# Patient Record
Sex: Female | Born: 1965 | Hispanic: No | Smoking: Former smoker
Health system: Southern US, Community
[De-identification: ages and names within clinical notes are randomized; demographics above are authoritative.]

## PROBLEM LIST (undated history)

## (undated) DIAGNOSIS — E669 Obesity, unspecified: Secondary | ICD-10-CM

## (undated) DIAGNOSIS — I5030 Unspecified diastolic (congestive) heart failure: Secondary | ICD-10-CM

## (undated) DIAGNOSIS — E119 Type 2 diabetes mellitus without complications: Secondary | ICD-10-CM

## (undated) DIAGNOSIS — R569 Unspecified convulsions: Secondary | ICD-10-CM

## (undated) DIAGNOSIS — R011 Cardiac murmur, unspecified: Secondary | ICD-10-CM

## (undated) DIAGNOSIS — I509 Heart failure, unspecified: Secondary | ICD-10-CM

## (undated) DIAGNOSIS — I219 Acute myocardial infarction, unspecified: Secondary | ICD-10-CM

## (undated) DIAGNOSIS — I201 Angina pectoris with documented spasm: Secondary | ICD-10-CM

## (undated) DIAGNOSIS — I428 Other cardiomyopathies: Secondary | ICD-10-CM

## (undated) DIAGNOSIS — I10 Essential (primary) hypertension: Secondary | ICD-10-CM

## (undated) DIAGNOSIS — E785 Hyperlipidemia, unspecified: Secondary | ICD-10-CM

## (undated) DIAGNOSIS — I447 Left bundle-branch block, unspecified: Secondary | ICD-10-CM

## (undated) DIAGNOSIS — Z720 Tobacco use: Secondary | ICD-10-CM

## (undated) DIAGNOSIS — J45909 Unspecified asthma, uncomplicated: Secondary | ICD-10-CM

## (undated) DIAGNOSIS — G8929 Other chronic pain: Secondary | ICD-10-CM

## (undated) DIAGNOSIS — I2510 Atherosclerotic heart disease of native coronary artery without angina pectoris: Secondary | ICD-10-CM

## (undated) HISTORY — DX: Hyperlipidemia, unspecified: E78.5

## (undated) HISTORY — DX: Atherosclerotic heart disease of native coronary artery without angina pectoris: I25.10

## (undated) HISTORY — PX: ANTERIOR CRUCIATE LIGAMENT REPAIR: SHX115

## (undated) HISTORY — DX: Unspecified asthma, uncomplicated: J45.909

## (undated) HISTORY — DX: Essential (primary) hypertension: I10

## (undated) HISTORY — PX: EMPYEMA DRAINAGE: SHX5097

## (undated) HISTORY — DX: Type 2 diabetes mellitus without complications: E11.9

## (undated) HISTORY — DX: Obesity, unspecified: E66.9

## (undated) HISTORY — DX: Unspecified convulsions: R56.9

## (undated) HISTORY — PX: TUBAL LIGATION: SHX77

## (undated) HISTORY — DX: Heart failure, unspecified: I50.9

## (undated) HISTORY — DX: Cardiac murmur, unspecified: R01.1

## (undated) HISTORY — DX: Acute myocardial infarction, unspecified: I21.9

## (undated) HISTORY — PX: TONSILLECTOMY: SUR1361

## (undated) HISTORY — PX: COLONOSCOPY: SHX174

## (undated) HISTORY — PX: BACK SURGERY: SHX140

---

## 2004-07-05 ENCOUNTER — Emergency Department: Payer: Self-pay | Admitting: Emergency Medicine

## 2004-12-24 ENCOUNTER — Emergency Department: Payer: Self-pay | Admitting: Emergency Medicine

## 2005-04-07 ENCOUNTER — Ambulatory Visit: Payer: Self-pay | Admitting: Family Medicine

## 2005-04-17 ENCOUNTER — Ambulatory Visit: Payer: Self-pay | Admitting: Family Medicine

## 2005-06-26 ENCOUNTER — Ambulatory Visit: Payer: Self-pay | Admitting: Unknown Physician Specialty

## 2005-06-29 ENCOUNTER — Ambulatory Visit: Payer: Self-pay | Admitting: Unknown Physician Specialty

## 2005-07-13 ENCOUNTER — Emergency Department: Payer: Self-pay | Admitting: Emergency Medicine

## 2005-07-13 ENCOUNTER — Other Ambulatory Visit: Payer: Self-pay

## 2005-07-14 ENCOUNTER — Emergency Department: Payer: Self-pay | Admitting: Unknown Physician Specialty

## 2005-07-17 ENCOUNTER — Ambulatory Visit: Payer: Self-pay | Admitting: Unknown Physician Specialty

## 2005-11-17 ENCOUNTER — Other Ambulatory Visit: Payer: Self-pay

## 2005-11-17 ENCOUNTER — Inpatient Hospital Stay: Payer: Self-pay | Admitting: Cardiovascular Disease

## 2005-11-18 ENCOUNTER — Other Ambulatory Visit: Payer: Self-pay

## 2005-11-24 ENCOUNTER — Emergency Department: Payer: Self-pay | Admitting: Emergency Medicine

## 2005-11-24 ENCOUNTER — Other Ambulatory Visit: Payer: Self-pay

## 2006-01-18 ENCOUNTER — Ambulatory Visit: Payer: Self-pay | Admitting: Family Medicine

## 2006-04-10 HISTORY — PX: TONSILLECTOMY AND ADENOIDECTOMY: SHX28

## 2006-05-14 ENCOUNTER — Other Ambulatory Visit: Payer: Self-pay

## 2006-05-14 ENCOUNTER — Emergency Department: Payer: Self-pay | Admitting: Unknown Physician Specialty

## 2006-05-28 ENCOUNTER — Ambulatory Visit: Payer: Self-pay | Admitting: Unknown Physician Specialty

## 2006-06-02 ENCOUNTER — Emergency Department: Payer: Self-pay | Admitting: Unknown Physician Specialty

## 2006-06-02 ENCOUNTER — Other Ambulatory Visit: Payer: Self-pay

## 2007-01-28 ENCOUNTER — Emergency Department: Payer: Self-pay | Admitting: Emergency Medicine

## 2007-08-06 IMAGING — CR PARANASAL SINUSES - COMPLETE 3 + VIEW
1 series · 5 of 5 positions shown · non-contrast
Comparison: none

REASON FOR EXAM: facial swelling  rm 2
COMMENTS:

[Series 1: view not recorded · 0.17mm/px · 5 of 5 slices shown]
[im 1/5]
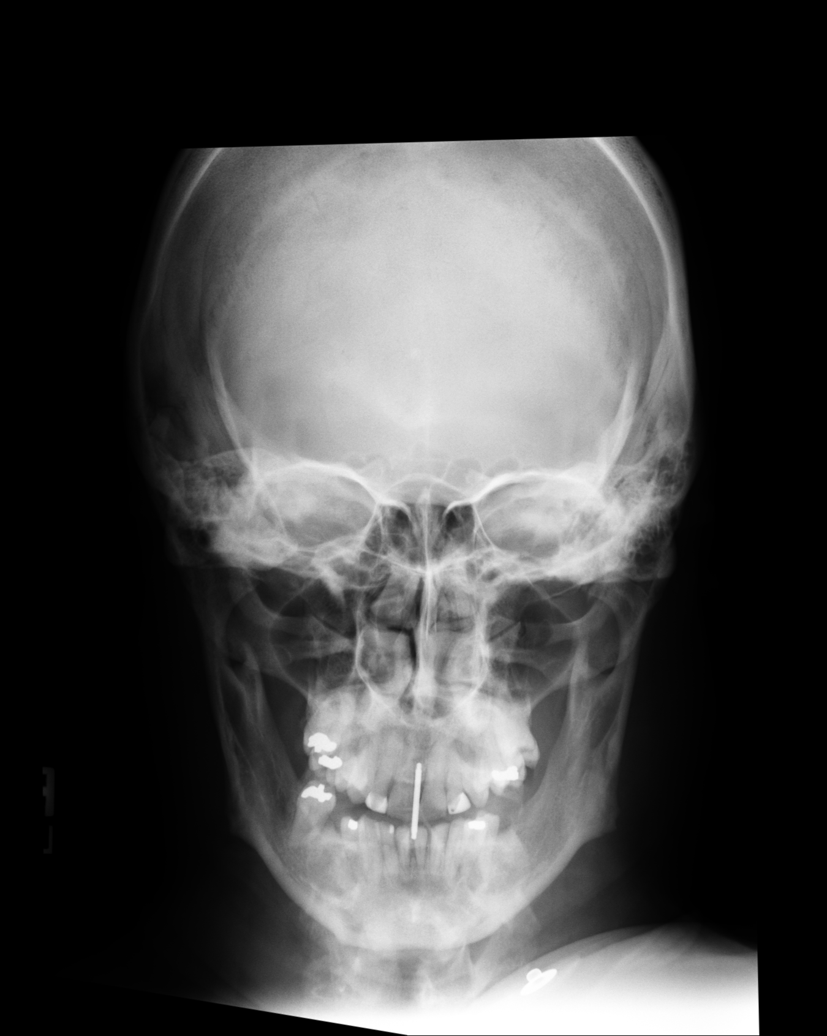
[im 2/5]
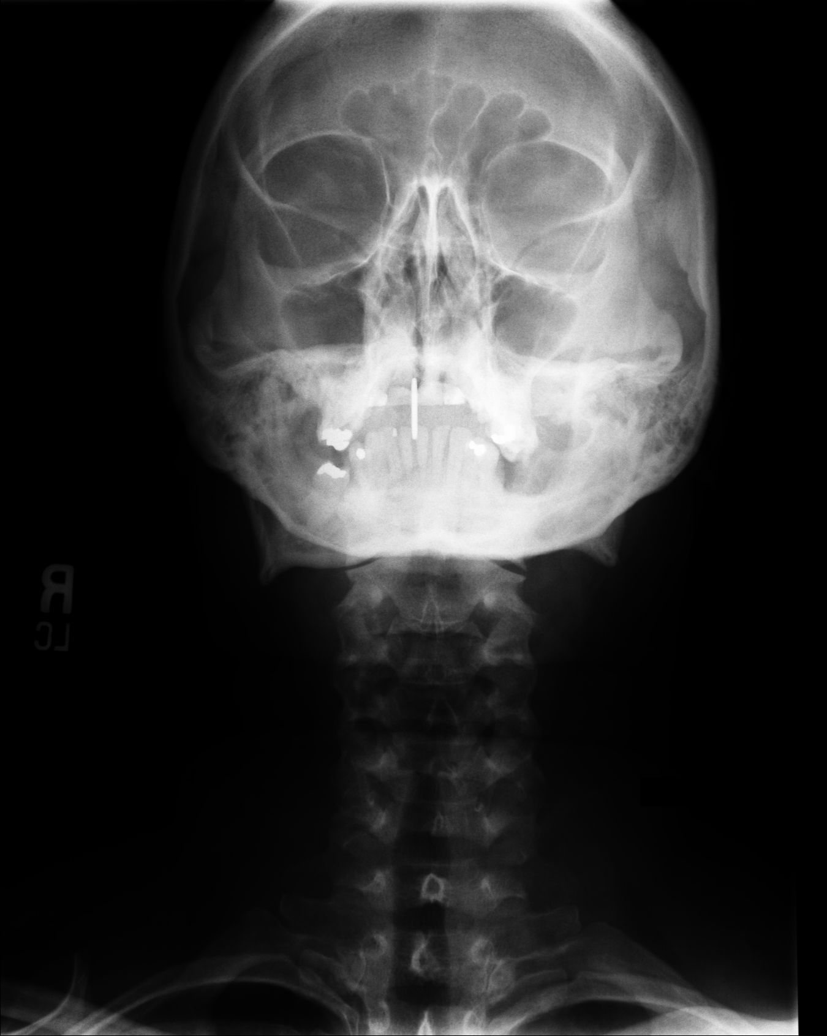
[im 3/5]
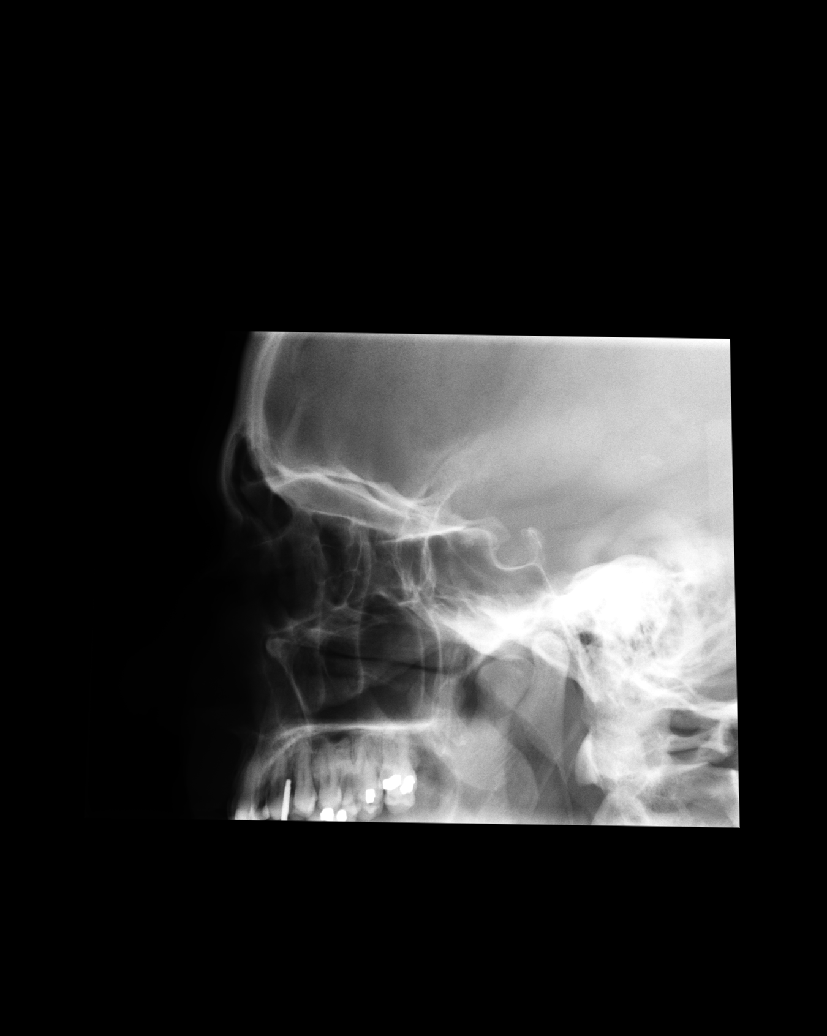
[im 4/5]
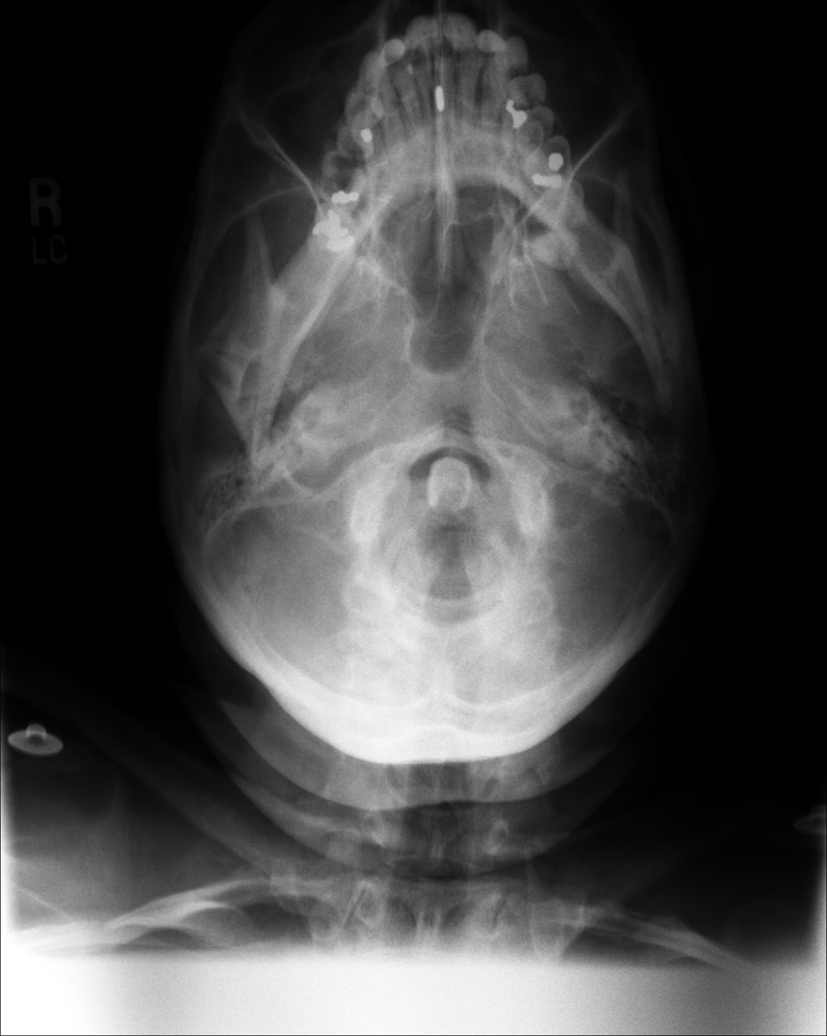
[im 5/5]
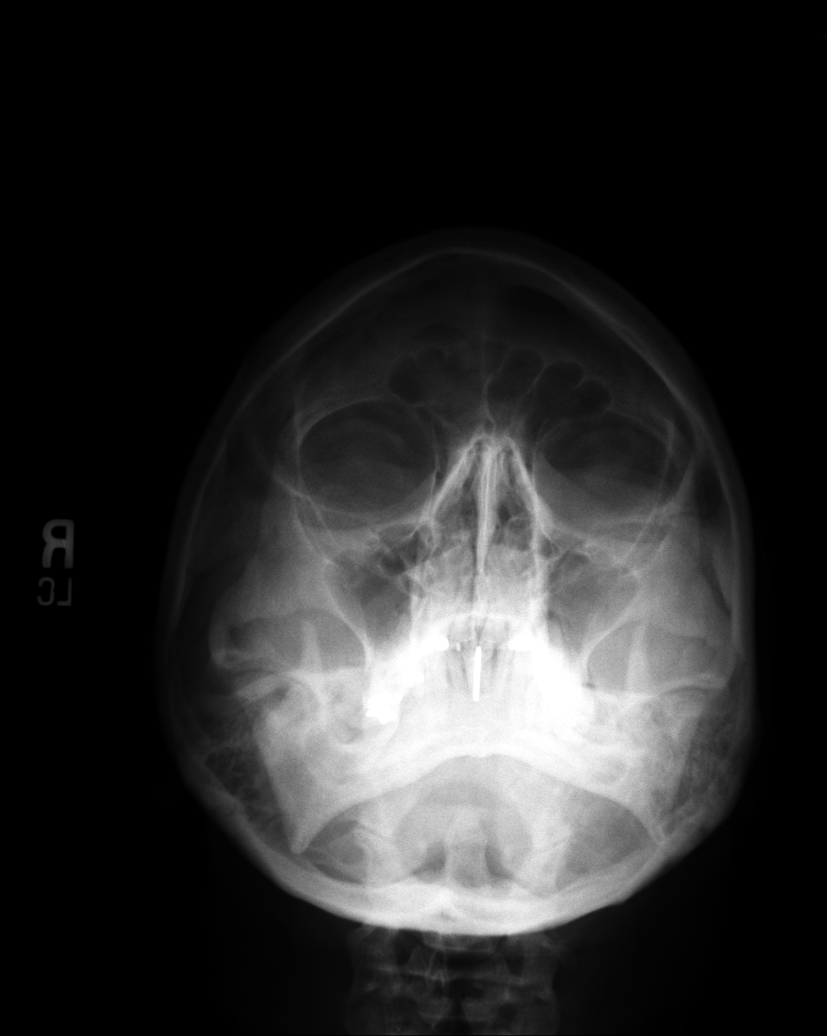

[5 of 5 positions shown; findings below may reference images not displayed]

PROCEDURE:     DXR - DXR SINUSES PARANASAL COMPLETE  - July 13, 2005  [DATE]

RESULT:     Evaluation of the paranasal sinuses demonstrates no evidence of
air fluid levels nor mucoperiosteal thickening.  The sinuses appear patent
without evidence of opacification. The visualized bony skeleton demonstrates
no evidence of fracture nor dislocation.
IMPRESSION: 1)Unremarkable paranasal sinus evaluation as described above.

## 2007-08-07 IMAGING — CT CT HEAD WITHOUT CONTRAST
2 series · 16 of 30 positions shown, 20 images · non-contrast
Comparison: none

REASON FOR EXAM: headache  RM 10
COMMENTS:

[Series 2: without · axial · non-contrast · 0.41mm/px · z∈[-149,-34]mm · 13 of 27 slices shown, 17 images]
[im 2/27  brain]
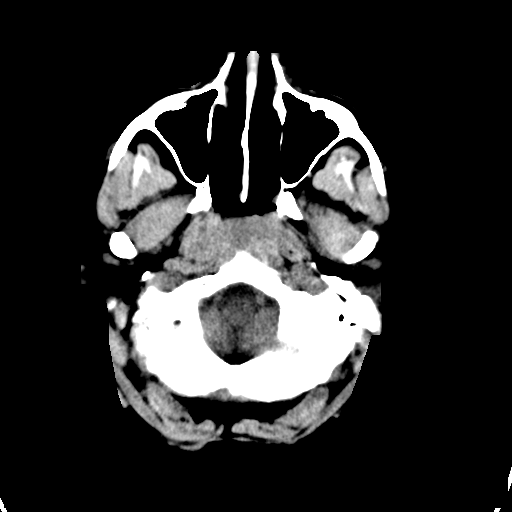
[im 2/27  bone]
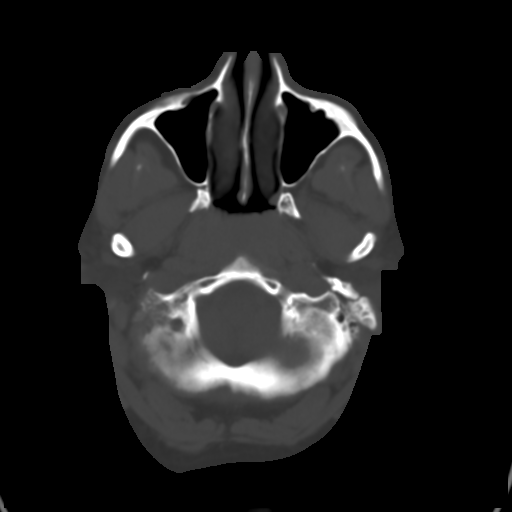
[im 4/27  brain]
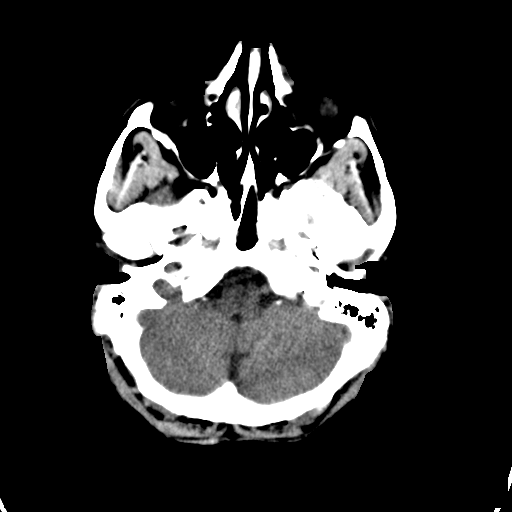
[im 6/27  brain]
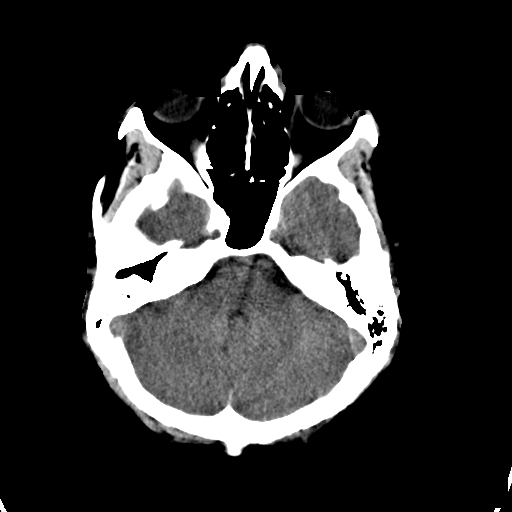
[im 8/27  brain]
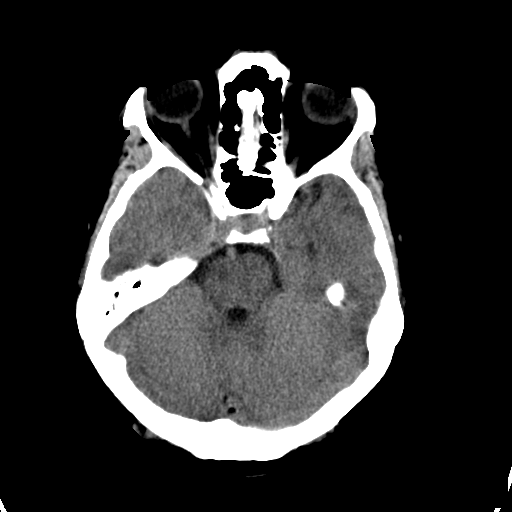
[im 10/27  brain]
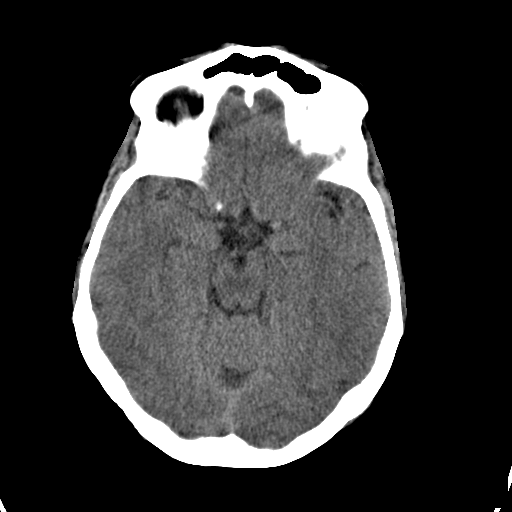
[im 10/27  bone]
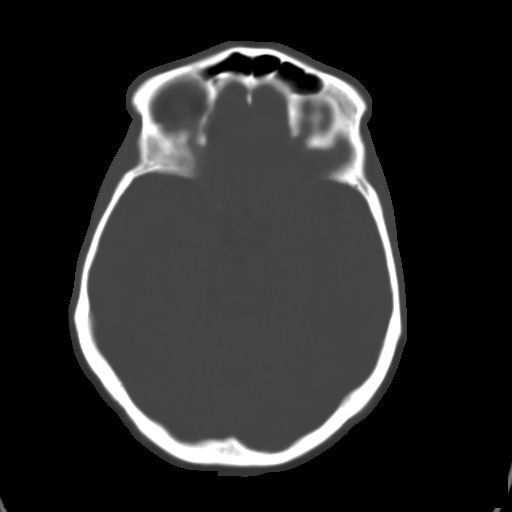
[im 12/27  brain]
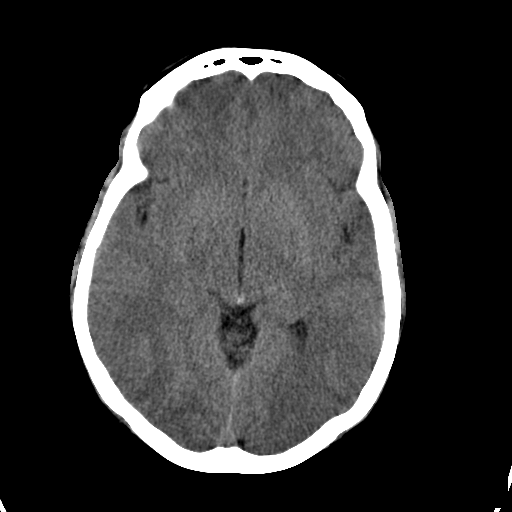
[im 14/27  brain]
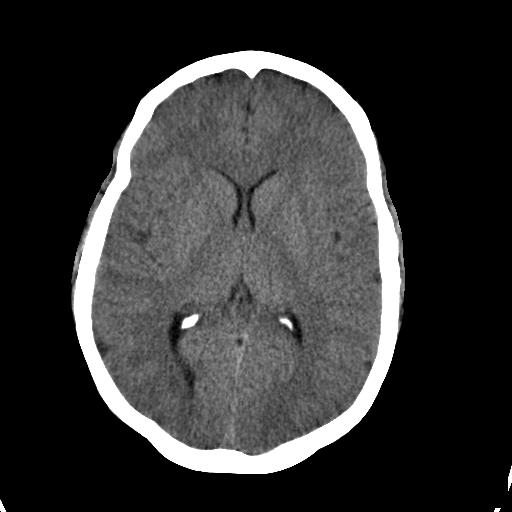
[im 15/27  brain]
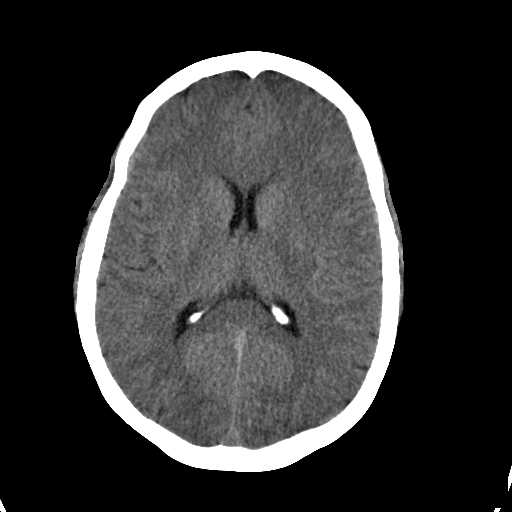
[im 17/27  brain]
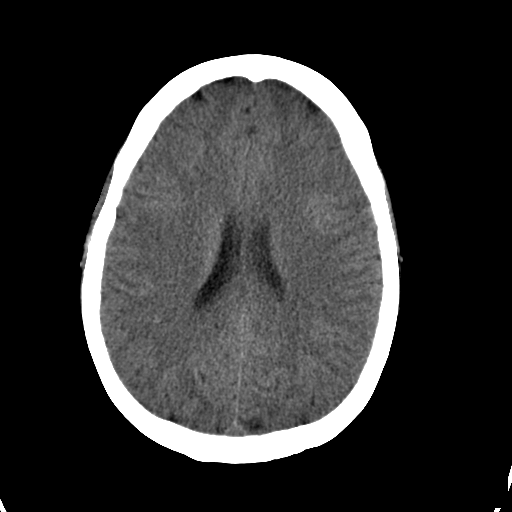
[im 17/27  bone]
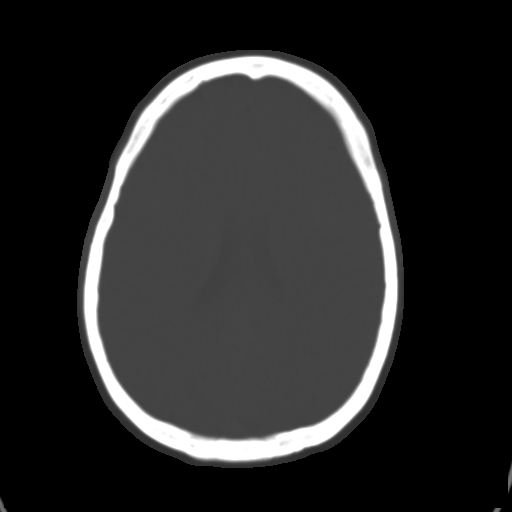
[im 19/27  brain]
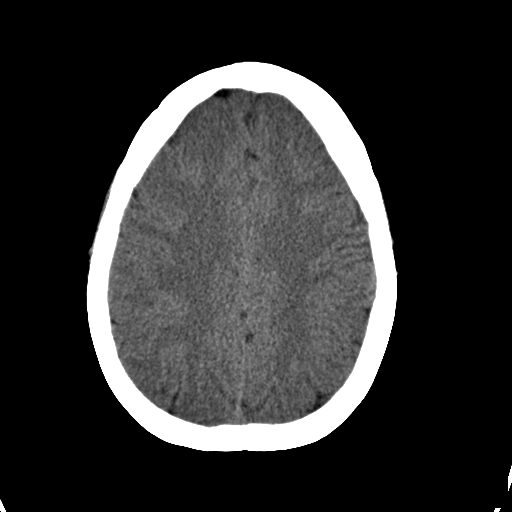
[im 21/27  brain]
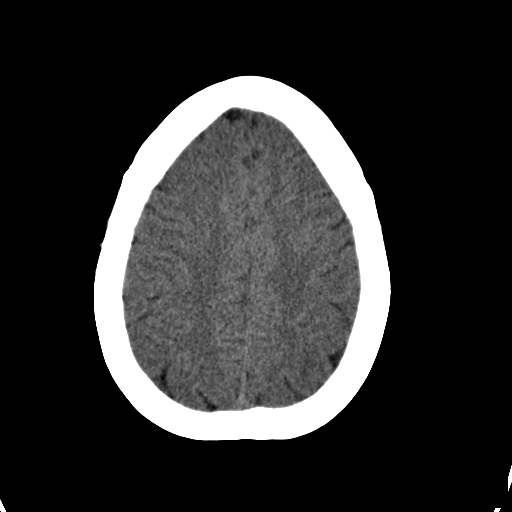
[im 23/27  brain]
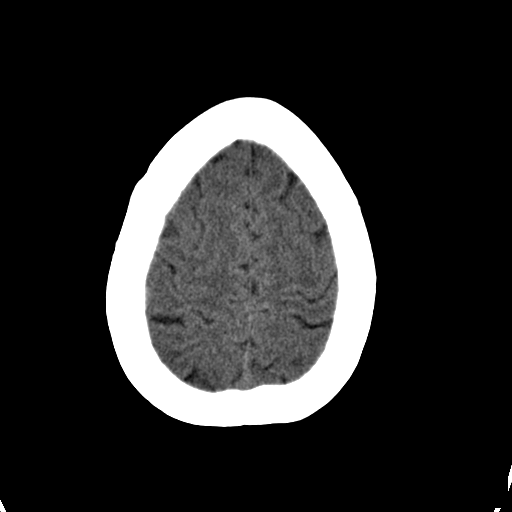
[im 25/27  brain]
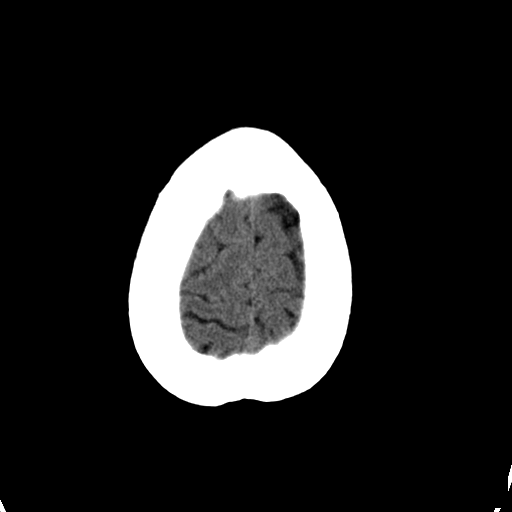
[im 25/27  bone]
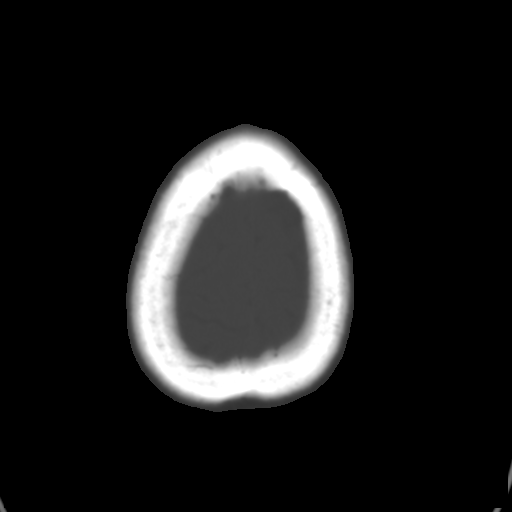

[Series 3: bone · axial · 0.41mm/px · z∈[-149,-109]mm · 3 of 27 slices shown]
[im 2/27  bone]
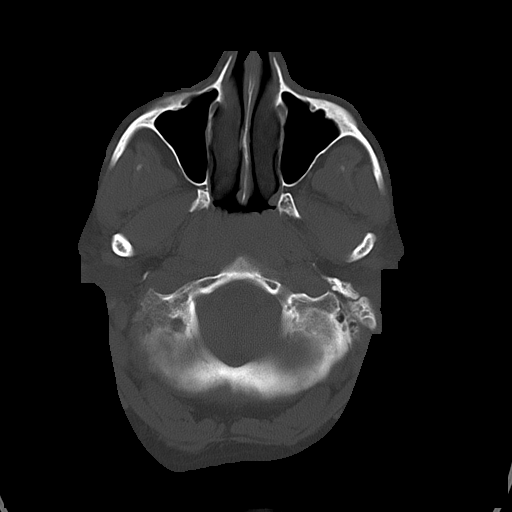
[im 6/27  bone]
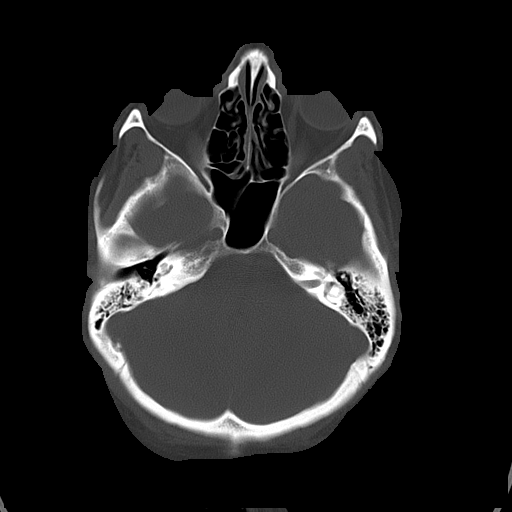
[im 10/27  bone]
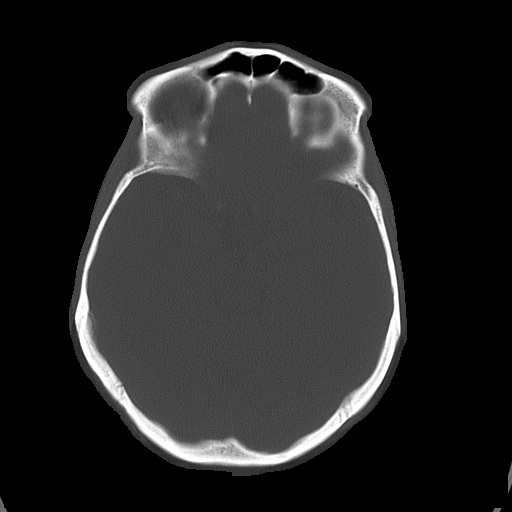

[16 of 30 positions shown; findings below may reference images not displayed]

PROCEDURE:     CT  - CT HEAD WITHOUT CONTRAST  - July 14, 2005  [DATE]

RESULT:     There is no evidence of intra-axial nor extra-axial fluid
collections nor evidence of acute hemorrhage. No secondary signs are
appreciated to suggest mass effect, subacute or chronic infarction. The
visualized bony skeleton evaluated with bone windowing demonstrates no
evidence of fracture or dislocation.
IMPRESSION: 1)Unremarkable Head CT as described above.

2)Dr. Hube of the Emergency Department was informed of these findings at the
time of the initial interpretation.

## 2009-05-28 ENCOUNTER — Inpatient Hospital Stay: Payer: Self-pay | Admitting: Internal Medicine

## 2010-02-13 ENCOUNTER — Emergency Department: Payer: Self-pay | Admitting: Emergency Medicine

## 2010-04-20 ENCOUNTER — Ambulatory Visit: Payer: Self-pay | Admitting: Internal Medicine

## 2010-04-29 ENCOUNTER — Inpatient Hospital Stay: Payer: Self-pay | Admitting: Internal Medicine

## 2013-02-03 ENCOUNTER — Emergency Department: Payer: Self-pay | Admitting: Emergency Medicine

## 2013-02-28 ENCOUNTER — Emergency Department: Payer: Self-pay | Admitting: Emergency Medicine

## 2013-02-28 LAB — COMPREHENSIVE METABOLIC PANEL
Albumin: 3.6 g/dL (ref 3.4–5.0)
Bilirubin,Total: 0.2 mg/dL (ref 0.2–1.0)
Chloride: 108 mmol/L — ABNORMAL HIGH (ref 98–107)
Co2: 25 mmol/L (ref 21–32)
Creatinine: 0.83 mg/dL (ref 0.60–1.30)
EGFR (African American): >60
EGFR (Non-African Amer.): >60
Glucose: 109 mg/dL — ABNORMAL HIGH (ref 65–99)
Osmolality: 278 (ref 275–301)
Potassium: 3.7 mmol/L (ref 3.5–5.1)
SGOT(AST): 22 U/L (ref 15–37)
SGPT (ALT): 24 U/L (ref 12–78)
Sodium: 139 mmol/L (ref 136–145)

## 2013-02-28 LAB — CBC
HGB: 14.4 g/dL (ref 12.0–16.0)
MCH: 29.1 pg (ref 26.0–34.0)
MCHC: 33.5 g/dL (ref 32.0–36.0)
MCV: 87 fL (ref 80–100)
Platelet: 205 10*3/uL (ref 150–440)
RDW: 14.1 % (ref 11.5–14.5)
WBC: 9.9 10*3/uL (ref 3.6–11.0)

## 2013-02-28 LAB — TROPONIN I
Troponin-I: <0.02 ng/mL
Troponin-I: <0.02 ng/mL

## 2013-02-28 LAB — URINALYSIS, COMPLETE
Bilirubin,UR: NEGATIVE
Ketone: NEGATIVE
Leukocyte Esterase: NEGATIVE
Nitrite: NEGATIVE
Ph: 6 (ref 4.5–8.0)
RBC,UR: 9 /HPF (ref 0–5)
Specific Gravity: 1.016 (ref 1.003–1.030)
Squamous Epithelial: 1
WBC UR: 2 /HPF (ref 0–5)

## 2013-02-28 LAB — CK TOTAL AND CKMB (NOT AT ARMC): CK, Total: 55 U/L (ref 21–215)

## 2014-05-21 DIAGNOSIS — E785 Hyperlipidemia, unspecified: Secondary | ICD-10-CM | POA: Insufficient documentation

## 2014-05-21 DIAGNOSIS — I2510 Atherosclerotic heart disease of native coronary artery without angina pectoris: Secondary | ICD-10-CM | POA: Insufficient documentation

## 2014-05-21 DIAGNOSIS — I10 Essential (primary) hypertension: Secondary | ICD-10-CM | POA: Insufficient documentation

## 2014-11-19 ENCOUNTER — Other Ambulatory Visit: Payer: Self-pay

## 2014-11-26 ENCOUNTER — Ambulatory Visit: Payer: Self-pay

## 2014-11-26 DIAGNOSIS — R569 Unspecified convulsions: Secondary | ICD-10-CM | POA: Insufficient documentation

## 2014-12-09 ENCOUNTER — Other Ambulatory Visit: Payer: Self-pay | Admitting: Urology

## 2014-12-09 DIAGNOSIS — R569 Unspecified convulsions: Secondary | ICD-10-CM

## 2015-01-01 ENCOUNTER — Telehealth: Payer: Self-pay | Admitting: Urology

## 2015-01-01 NOTE — Telephone Encounter (Signed)
You ordered a scan on this patient and she does not have any insurance. I call her and she does not want to have it done due to that. She said she was a [facility] and may try to get it done thru them.  Thanks, Sharyn Lull

## 2015-05-27 ENCOUNTER — Ambulatory Visit: Payer: Self-pay

## 2015-05-27 LAB — LIPID PANEL
CHOLESTEROL: 154 mg/dL (ref 0–200)
HDL: 23 mg/dL — AB (ref 35–70)
LDL Cholesterol: 85 mg/dL
TRIGLYCERIDES: 231 mg/dL — AB (ref 40–160)

## 2015-05-27 LAB — HEPATIC FUNCTION PANEL
ALK PHOS: 84 U/L (ref 25–125)
ALT: 21 U/L (ref 7–35)
AST: 18 U/L (ref 13–35)
BILIRUBIN, TOTAL: 0.2 mg/dL

## 2015-05-27 LAB — CBC AND DIFFERENTIAL
HEMATOCRIT: 41 % (ref 36–46)
HEMOGLOBIN: 13.8 g/dL (ref 12.0–16.0)
Neutrophils Absolute: 6 /uL
Platelets: 281 10*3/uL (ref 150–399)
WBC: 10.5 10^3/mL

## 2015-05-27 LAB — BASIC METABOLIC PANEL
BUN: 8 mg/dL (ref 4–21)
CREATININE: 0.7 mg/dL (ref 0.5–1.1)
Glucose: 99 mg/dL
POTASSIUM: 4.6 mmol/L (ref 3.4–5.3)
SODIUM: 140 mmol/L (ref 137–147)

## 2015-05-27 LAB — TSH: TSH: 1.51 u[IU]/mL (ref 0.41–5.90)

## 2015-05-27 LAB — HEMOGLOBIN A1C: Hemoglobin A1C: 5.9

## 2015-06-03 ENCOUNTER — Ambulatory Visit: Payer: Self-pay

## 2015-06-03 DIAGNOSIS — Z720 Tobacco use: Secondary | ICD-10-CM | POA: Insufficient documentation

## 2015-06-03 DIAGNOSIS — E669 Obesity, unspecified: Secondary | ICD-10-CM | POA: Insufficient documentation

## 2015-06-03 DIAGNOSIS — I252 Old myocardial infarction: Secondary | ICD-10-CM | POA: Insufficient documentation

## 2015-06-03 DIAGNOSIS — E785 Hyperlipidemia, unspecified: Secondary | ICD-10-CM | POA: Insufficient documentation

## 2015-06-03 DIAGNOSIS — R7303 Prediabetes: Secondary | ICD-10-CM | POA: Insufficient documentation

## 2015-06-03 DIAGNOSIS — D72820 Lymphocytosis (symptomatic): Secondary | ICD-10-CM | POA: Insufficient documentation

## 2015-07-20 DIAGNOSIS — I10 Essential (primary) hypertension: Secondary | ICD-10-CM

## 2015-07-20 DIAGNOSIS — R569 Unspecified convulsions: Secondary | ICD-10-CM

## 2015-07-20 DIAGNOSIS — R7303 Prediabetes: Secondary | ICD-10-CM

## 2015-07-20 DIAGNOSIS — E785 Hyperlipidemia, unspecified: Secondary | ICD-10-CM

## 2015-07-20 DIAGNOSIS — Z720 Tobacco use: Secondary | ICD-10-CM

## 2015-07-20 DIAGNOSIS — I252 Old myocardial infarction: Secondary | ICD-10-CM

## 2015-07-20 DIAGNOSIS — E669 Obesity, unspecified: Secondary | ICD-10-CM

## 2015-07-20 DIAGNOSIS — I25119 Atherosclerotic heart disease of native coronary artery with unspecified angina pectoris: Secondary | ICD-10-CM

## 2015-07-20 DIAGNOSIS — D72820 Lymphocytosis (symptomatic): Secondary | ICD-10-CM

## 2015-11-05 ENCOUNTER — Observation Stay
Admission: EM | Admit: 2015-11-05 | Discharge: 2015-11-06 | Disposition: A | Discharge status: Home / Self Care | Payer: Self-pay | Attending: Internal Medicine | Admitting: Internal Medicine

## 2015-11-05 ENCOUNTER — Encounter: Payer: Self-pay | Admitting: Emergency Medicine

## 2015-11-05 ENCOUNTER — Emergency Department: Payer: Self-pay

## 2015-11-05 ENCOUNTER — Emergency Department
Admission: EM | Admit: 2015-11-05 | Discharge: 2015-11-05 | Discharge status: Against Medical Advice | Payer: Self-pay | Attending: Emergency Medicine | Admitting: Emergency Medicine

## 2015-11-05 DIAGNOSIS — R0789 Other chest pain: Principal | ICD-10-CM | POA: Insufficient documentation

## 2015-11-05 DIAGNOSIS — E785 Hyperlipidemia, unspecified: Secondary | ICD-10-CM | POA: Insufficient documentation

## 2015-11-05 DIAGNOSIS — Z8249 Family history of ischemic heart disease and other diseases of the circulatory system: Secondary | ICD-10-CM | POA: Insufficient documentation

## 2015-11-05 DIAGNOSIS — Z888 Allergy status to other drugs, medicaments and biological substances status: Secondary | ICD-10-CM | POA: Insufficient documentation

## 2015-11-05 DIAGNOSIS — I252 Old myocardial infarction: Secondary | ICD-10-CM | POA: Insufficient documentation

## 2015-11-05 DIAGNOSIS — Z955 Presence of coronary angioplasty implant and graft: Secondary | ICD-10-CM | POA: Insufficient documentation

## 2015-11-05 DIAGNOSIS — Z886 Allergy status to analgesic agent status: Secondary | ICD-10-CM | POA: Insufficient documentation

## 2015-11-05 DIAGNOSIS — F17210 Nicotine dependence, cigarettes, uncomplicated: Secondary | ICD-10-CM | POA: Insufficient documentation

## 2015-11-05 DIAGNOSIS — J45909 Unspecified asthma, uncomplicated: Secondary | ICD-10-CM | POA: Insufficient documentation

## 2015-11-05 DIAGNOSIS — Z823 Family history of stroke: Secondary | ICD-10-CM | POA: Insufficient documentation

## 2015-11-05 DIAGNOSIS — I509 Heart failure, unspecified: Secondary | ICD-10-CM | POA: Insufficient documentation

## 2015-11-05 DIAGNOSIS — Z825 Family history of asthma and other chronic lower respiratory diseases: Secondary | ICD-10-CM | POA: Insufficient documentation

## 2015-11-05 DIAGNOSIS — Z6839 Body mass index (BMI) 39.0-39.9, adult: Secondary | ICD-10-CM | POA: Insufficient documentation

## 2015-11-05 DIAGNOSIS — I110 Hypertensive heart disease with heart failure: Secondary | ICD-10-CM | POA: Insufficient documentation

## 2015-11-05 DIAGNOSIS — R7303 Prediabetes: Secondary | ICD-10-CM | POA: Insufficient documentation

## 2015-11-05 DIAGNOSIS — I447 Left bundle-branch block, unspecified: Secondary | ICD-10-CM | POA: Insufficient documentation

## 2015-11-05 DIAGNOSIS — R079 Chest pain, unspecified: Secondary | ICD-10-CM

## 2015-11-05 DIAGNOSIS — I493 Ventricular premature depolarization: Secondary | ICD-10-CM | POA: Insufficient documentation

## 2015-11-05 DIAGNOSIS — Z885 Allergy status to narcotic agent status: Secondary | ICD-10-CM | POA: Insufficient documentation

## 2015-11-05 DIAGNOSIS — Z7902 Long term (current) use of antithrombotics/antiplatelets: Secondary | ICD-10-CM | POA: Insufficient documentation

## 2015-11-05 DIAGNOSIS — I2510 Atherosclerotic heart disease of native coronary artery without angina pectoris: Secondary | ICD-10-CM | POA: Insufficient documentation

## 2015-11-05 DIAGNOSIS — E669 Obesity, unspecified: Secondary | ICD-10-CM | POA: Insufficient documentation

## 2015-11-05 DIAGNOSIS — Z841 Family history of disorders of kidney and ureter: Secondary | ICD-10-CM | POA: Insufficient documentation

## 2015-11-05 DIAGNOSIS — Z833 Family history of diabetes mellitus: Secondary | ICD-10-CM | POA: Insufficient documentation

## 2015-11-05 DIAGNOSIS — R569 Unspecified convulsions: Secondary | ICD-10-CM | POA: Insufficient documentation

## 2015-11-05 DIAGNOSIS — Z7982 Long term (current) use of aspirin: Secondary | ICD-10-CM | POA: Insufficient documentation

## 2015-11-05 LAB — CBC WITH DIFFERENTIAL/PLATELET
BASOS PCT: 1 %
Basophils Absolute: 0.1 10*3/uL (ref 0–0.1)
EOS ABS: 0.1 10*3/uL (ref 0–0.7)
EOS PCT: 2 %
HCT: 43.4 % (ref 35.0–47.0)
HEMOGLOBIN: 14.9 g/dL (ref 12.0–16.0)
LYMPHS ABS: 1.9 10*3/uL (ref 1.0–3.6)
Lymphocytes Relative: 28 %
MCH: 29.6 pg (ref 26.0–34.0)
MCHC: 34.4 g/dL (ref 32.0–36.0)
MCV: 85.9 fL (ref 80.0–100.0)
Monocytes Absolute: 0.4 10*3/uL (ref 0.2–0.9)
Monocytes Relative: 6 %
NEUTROS PCT: 63 %
Neutro Abs: 4.2 10*3/uL (ref 1.4–6.5)
PLATELETS: 216 10*3/uL (ref 150–440)
RBC: 5.05 MIL/uL (ref 3.80–5.20)
RDW: 14.6 % — ABNORMAL HIGH (ref 11.5–14.5)
WBC: 6.6 10*3/uL (ref 3.6–11.0)

## 2015-11-05 LAB — PROTIME-INR
INR: 0.99
PROTHROMBIN TIME: 13.1 s (ref 11.4–15.2)

## 2015-11-05 LAB — CBC
HCT: 45.4 % (ref 35.0–47.0)
Hemoglobin: 14.8 g/dL (ref 12.0–16.0)
MCH: 28.3 pg (ref 26.0–34.0)
MCHC: 32.7 g/dL (ref 32.0–36.0)
MCV: 86.7 fL (ref 80.0–100.0)
PLATELETS: 210 10*3/uL (ref 150–440)
RBC: 5.23 MIL/uL — ABNORMAL HIGH (ref 3.80–5.20)
RDW: 14.8 % — AB (ref 11.5–14.5)
WBC: 8.2 10*3/uL (ref 3.6–11.0)

## 2015-11-05 LAB — COMPREHENSIVE METABOLIC PANEL
ALBUMIN: 4.3 g/dL (ref 3.5–5.0)
ALK PHOS: 84 U/L (ref 38–126)
ALT: 23 U/L (ref 14–54)
ANION GAP: 8 (ref 5–15)
AST: 23 U/L (ref 15–41)
BUN: 11 mg/dL (ref 6–20)
CHLORIDE: 107 mmol/L (ref 101–111)
CO2: 23 mmol/L (ref 22–32)
Calcium: 9.1 mg/dL (ref 8.9–10.3)
Creatinine, Ser: 0.69 mg/dL (ref 0.44–1.00)
GFR calc non Af Amer: >60 mL/min (ref >60)
GLUCOSE: 104 mg/dL — AB (ref 65–99)
Potassium: 3.7 mmol/L (ref 3.5–5.1)
SODIUM: 138 mmol/L (ref 135–145)
Total Bilirubin: 0.7 mg/dL (ref 0.3–1.2)
Total Protein: 7.6 g/dL (ref 6.5–8.1)

## 2015-11-05 LAB — BASIC METABOLIC PANEL
Anion gap: 10 (ref 5–15)
BUN: 11 mg/dL (ref 6–20)
CALCIUM: 9 mg/dL (ref 8.9–10.3)
CO2: 22 mmol/L (ref 22–32)
CREATININE: 0.81 mg/dL (ref 0.44–1.00)
Chloride: 105 mmol/L (ref 101–111)
GFR calc Af Amer: >60 mL/min (ref >60)
GLUCOSE: 116 mg/dL — AB (ref 65–99)
POTASSIUM: 3.6 mmol/L (ref 3.5–5.1)
SODIUM: 137 mmol/L (ref 135–145)

## 2015-11-05 LAB — TROPONIN I
Troponin I: <0.03 ng/mL (ref <0.03)
Troponin I: <0.03 ng/mL (ref <0.03)

## 2015-11-05 LAB — APTT: APTT: 30 s (ref 24–36)

## 2015-11-05 LAB — LIPASE, BLOOD: LIPASE: 16 U/L (ref 11–51)

## 2015-11-05 LAB — HEPARIN LEVEL (UNFRACTIONATED)

## 2015-11-05 MED ORDER — SODIUM CHLORIDE 0.9% FLUSH
3.0000 mL | Freq: Two times a day (BID) | INTRAVENOUS | Status: DC
  Administered 2015-11-05: 3 mL via INTRAVENOUS

## 2015-11-05 MED ORDER — SIMVASTATIN 20 MG PO TABS
20.0000 mg | ORAL_TABLET | Freq: Every day | ORAL | Status: DC
  Administered 2015-11-05: 20 mg via ORAL
  Filled 2015-11-05: qty 1

## 2015-11-05 MED ORDER — HEPARIN (PORCINE) IN NACL 100-0.45 UNIT/ML-% IJ SOLN
1400.0000 [IU]/h | INTRAMUSCULAR | Status: DC
  Administered 2015-11-05: 900 [IU]/h via INTRAVENOUS
  Filled 2015-11-05 (×3): qty 250

## 2015-11-05 MED ORDER — DOCUSATE SODIUM 100 MG PO CAPS
100.0000 mg | ORAL_CAPSULE | Freq: Two times a day (BID) | ORAL | Status: DC
  Filled 2015-11-05 (×3): qty 1

## 2015-11-05 MED ORDER — ISOSORBIDE DINITRATE 20 MG PO TABS: 30.0000 mg | ORAL_TABLET | Freq: Every day | ORAL | Status: DC

## 2015-11-05 MED ORDER — ASPIRIN 81 MG PO CHEW
162.0000 mg | CHEWABLE_TABLET | Freq: Once | ORAL | Status: AC
  Administered 2015-11-05: 162 mg via ORAL
  Filled 2015-11-05: qty 2

## 2015-11-05 MED ORDER — HEPARIN BOLUS VIA INFUSION
4000.0000 [IU] | Freq: Once | INTRAVENOUS | Status: AC
  Administered 2015-11-05: 4000 [IU] via INTRAVENOUS
  Filled 2015-11-05: qty 4000

## 2015-11-05 MED ORDER — MECLIZINE HCL 25 MG PO TABS: 25.0000 mg | ORAL_TABLET | Freq: Three times a day (TID) | ORAL | Status: DC | PRN

## 2015-11-05 MED ORDER — NITROGLYCERIN 0.4 MG SL SUBL: 0.4000 mg | SUBLINGUAL_TABLET | SUBLINGUAL | Status: DC | PRN

## 2015-11-05 MED ORDER — ONDANSETRON HCL 4 MG PO TABS: 4.0000 mg | ORAL_TABLET | Freq: Four times a day (QID) | ORAL | Status: DC | PRN

## 2015-11-05 MED ORDER — METOPROLOL TARTRATE 25 MG PO TABS
25.0000 mg | ORAL_TABLET | Freq: Two times a day (BID) | ORAL | Status: DC
  Administered 2015-11-05 – 2015-11-06 (×2): 25 mg via ORAL
  Filled 2015-11-05 (×3): qty 1

## 2015-11-05 MED ORDER — AMLODIPINE BESYLATE 5 MG PO TABS
5.0000 mg | ORAL_TABLET | Freq: Every day | ORAL | Status: DC
  Administered 2015-11-05 – 2015-11-06 (×2): 5 mg via ORAL
  Filled 2015-11-05 (×2): qty 1

## 2015-11-05 MED ORDER — NITROGLYCERIN 0.4 MG SL SUBL
0.4000 mg | SUBLINGUAL_TABLET | SUBLINGUAL | Status: DC | PRN
  Administered 2015-11-05: 0.4 mg via SUBLINGUAL
  Filled 2015-11-05: qty 1

## 2015-11-05 MED ORDER — CLOPIDOGREL BISULFATE 75 MG PO TABS
75.0000 mg | ORAL_TABLET | Freq: Every day | ORAL | Status: DC
  Administered 2015-11-05 – 2015-11-06 (×2): 75 mg via ORAL
  Filled 2015-11-05 (×2): qty 1

## 2015-11-05 MED ORDER — ONDANSETRON HCL 4 MG/2ML IJ SOLN: 4.0000 mg | Freq: Four times a day (QID) | INTRAMUSCULAR | Status: DC | PRN

## 2015-11-05 MED ORDER — HEPARIN BOLUS VIA INFUSION
2600.0000 [IU] | Freq: Once | INTRAVENOUS | Status: AC
  Administered 2015-11-05: 2600 [IU] via INTRAVENOUS
  Filled 2015-11-05: qty 2600

## 2015-11-05 MED ORDER — ISOSORBIDE MONONITRATE ER 30 MG PO TB24
30.0000 mg | ORAL_TABLET | Freq: Every day | ORAL | Status: DC
  Administered 2015-11-05 – 2015-11-06 (×2): 30 mg via ORAL
  Filled 2015-11-05 (×2): qty 1

## 2015-11-05 NOTE — ED Notes (Signed)
Resumed care from [place]; pt resting comfortably with SO at bedside. Pt reports pain decreased to 4/10 after SL nitro. NAD noted.

## 2015-11-05 NOTE — ED Notes (Signed)
Multiple complaints , chest pain , n/ abd pain, headache, " I am not feeling well at all"

## 2015-11-05 NOTE — ED Provider Notes (Signed)
[facility] Emergency Department Provider Note _______________________   First MD Initiated Contact with Patient 11/05/15 [phone]     (approximate)  I have reviewed the triage vital signs and the nursing notes.   HISTORY  Chief Complaint Numbness and Chest Pain   HPI Kathy [name] is a 50 y.o. female with history of "2 heart attacks" followed by Dr. Ubaldo Glassing presents with 7 out of 10 left-sided chest pain described as heaviness associated with some dyspnea with onset tonight.   Past Medical History:  Diagnosis Date  . Asthma   . CHF (congestive heart failure) ([place])   . Hyperlipidemia   . Hypertension   . Myocardial infarction ([place])   . Obesity   . Seizures [facility])     Patient Active Problem List   Diagnosis Date Noted  . Elevated lymphocytes 06/03/2015  . Dyslipidemia 06/03/2015  . Prediabetes 06/03/2015  . History of MI (myocardial infarction) 06/03/2015  . Tobacco abuse 06/03/2015  . Obesity 06/03/2015  . Seizures ([place]) 11/26/2014  . Hypertension 05/21/2014  . CAD (coronary artery disease) 05/21/2014  . Hyperlipidemia 05/21/2014    Past Surgical History:  Procedure Laterality Date  . ANTERIOR CRUCIATE LIGAMENT REPAIR    . BACK SURGERY     L4 and L5  . CARDIAC CATHETERIZATION    . TONSILLECTOMY AND ADENOIDECTOMY  2008  . TUBAL LIGATION      Prior to Admission medications   Medication Sig Start Date End Date Taking? Authorizing Provider  amLODipine (NORVASC) 5 MG tablet Take 5 mg by mouth daily.    Historical Provider, MD  clopidogrel (PLAVIX) 75 MG tablet Take 75 mg by mouth daily.    Historical Provider, MD  furosemide (LASIX) 20 MG tablet Take 20 mg by mouth daily.    Historical Provider, MD  isosorbide dinitrate (ISORDIL) 30 MG tablet Take 30 mg by mouth daily.    Historical Provider, MD  metoprolol tartrate (LOPRESSOR) 25 MG tablet Take 25 mg by mouth 2 (two) times daily.    Historical Provider, MD  simvastatin (ZOCOR) 20 MG tablet  Take 20 mg by mouth daily.    Historical Provider, MD    Allergies Aspirin; Celebrex [celecoxib]; Percocet [oxycodone-acetaminophen]; Simvastatin-high dose; Skelaxin [metaxalone]; and Morphine  Family History  Problem Relation Age of Onset  . Kidney disease Sister   . Diabetes Sister   . COPD Sister   . Stroke Sister   . Diabetes Mother   . Stroke Mother   . Heart attack Mother   . Stroke Father   . Asthma Daughter   . Asthma Son     Social History Social History  Substance Use Topics  . Smoking status: Current Every Day Smoker    Packs/day: 0.50    Types: Cigarettes  . Smokeless tobacco: Never Used  . Alcohol use No    Review of Systems Constitutional: No fever/chills Eyes: No visual changes. ENT: No sore throat. Cardiovascular:Positive chest pain. Respiratory: Denies shortness of breath. Gastrointestinal: No abdominal pain.  No nausea, no vomiting.  No diarrhea.  No constipation. Genitourinary: Negative for dysuria. Musculoskeletal: Negative for back pain. Skin: Negative for rash. Neurological: Negative for headaches, focal weakness or numbness. 10-point ROS otherwise negative.  ____________________________________________   PHYSICAL EXAM:  VITAL SIGNS: ED Triage Vitals [11/05/15 0356]  Enc Vitals Group     BP 131/75     Pulse Rate 61     Resp 15     Temp 98 F (36.7 C)  Temp Source Oral     SpO2 97 %     Weight 250 lb (113.4 kg)     Height 5\' 7"  (1.702 m)     Head Circumference      Peak Flow      Pain Score      Pain Loc      Pain Edu?      Excl. in [place]?     Constitutional: Alert and oriented. Well appearing and in no acute distress. Eyes: Conjunctivae are normal. PERRL. EOMI. Head: Atraumatic. Nose: No congestion/rhinnorhea. Mouth/Throat: Mucous membranes are moist.  Oropharynx non-erythematous. Neck: No stridor.  No meningeal signs.   Cardiovascular: Normal rate, regular rhythm. Good peripheral circulation. Grossly normal heart sounds.    Respiratory: Normal respiratory effort.  No retractions. Lungs CTAB. Gastrointestinal: Soft and nontender. No distention.  Musculoskeletal: No lower extremity tenderness nor edema. No gross deformities of extremities. Neurologic:  Normal speech and language. No gross focal neurologic deficits are appreciated.  Skin:  Skin is warm, dry and intact. No rash noted. Psychiatric: Mood and affect are normal. Speech and behavior are normal.**}  ____________________________________________   LABS (all labs ordered are listed, but only abnormal results are displayed)  Labs Reviewed  BASIC METABOLIC PANEL - Abnormal; Notable for the following:       Result Value   Glucose, Bld 116 (*)    All other components within normal limits  CBC - Abnormal; Notable for the following:    RBC 5.23 (*)    RDW 14.8 (*)    All other components within normal limits  TROPONIN I   ____________________________________________  EKG  ED ECG REPORT I, Severn N Luismanuel Corman, the attending physician, personally viewed and interpreted this ECG.   Date: 11/05/2015  EKG Time: 4:05 AM  Rate: 63  Rhythm: Normal sinus rhythm with left bundle-branch block  Axis: Normal  Intervals: Normal  ST&T Change: None  ____________________________________________  RADIOLOGY I, Prosser N Larkin Morelos, personally viewed and evaluated these images (plain radiographs) as part of my medical decision making, as well as reviewing the written report by the radiologist.   Procedures   ____________________________________________   INITIAL IMPRESSION / [place] / ED COURSE  Pertinent labs & imaging results that were available during my care of the patient were reviewed by me and considered in my medical decision making (see chart for details).  Patient discussed with Dr. Marcille Blanco for hospital admission given a stray and the new bundle branch block in comparison to EKG performed in 2014. I informed the patient of all clinical  findings and the necessity to be admitted to the hospital however she stated that she has to leave [place] secondary to the fact that her sister is on peritoneal dialysis and she has to remove her from it. Patient states that she will return to the emergency department immediately after the onset. I informed the patient of the significant risk that she's taking by this decision however she is adamant that she has to leave.  Clinical Course    ____________________________________________  FINAL CLINICAL IMPRESSION(S) / ED DIAGNOSES  Final diagnoses:  Chest pain, unspecified chest pain type  Left bundle branch block     MEDICATIONS GIVEN DURING THIS VISIT:  Medications - No data to display   NEW OUTPATIENT MEDICATIONS STARTED DURING THIS VISIT:  New Prescriptions   No medications on file      Note:  This document was prepared using Dragon voice recognition software and may  include unintentional dictation errors.    Gregor Hams, MD 11/05/15 [phone]

## 2015-11-05 NOTE — ED Notes (Signed)
Attempted to call report; after holding for several minutes, I was told that the nurse would call me back within 5 minutes.

## 2015-11-05 NOTE — Progress Notes (Signed)
ANTICOAGULATION CONSULT NOTE - Initial Consult  Pharmacy Consult for Heparin Drip Indication: chest pain/ACS  Allergies  Allergen Reactions  . Aspirin Anaphylaxis  . Celebrex [Celecoxib] Anaphylaxis and Hives  . Percocet [Oxycodone-Acetaminophen] Shortness Of Breath  . Simvastatin-High Dose Hives and Shortness Of Breath    Can tolerate 20 mg dose.   Jonah Blue [Metaxalone] Anaphylaxis  . Morphine Swelling  . Vicodin [Hydrocodone-Acetaminophen] Hives and Other (See Comments)    Feels like her tongue swells    Patient Measurements: Height: 5\' 7"  (170.2 cm) Weight: 253 lb 4.8 oz (114.9 kg) IBW/kg (Calculated) : 61.6 Heparin Dosing Weight: 72kg  Vital Signs: Temp: 97.8 F (36.6 C) (07/28 1931) Temp Source: Oral (07/28 1931) BP: 131/65 (07/28 1931) Pulse Rate: 59 (07/28 1931)  Labs:  Recent Labs  11/05/15 0414 11/05/15 0945 11/05/15 1425 11/05/15 1842  HGB 14.8 14.9  --   --   HCT 45.4 43.4  --   --   PLT 210 216  --   --   APTT  --  30  --   --   LABPROT  --  13.1  --   --   INR  --  0.99  --   --   HEPARINUNFRC  --   --   --  <0.10*  CREATININE 0.81 0.69  --   --   TROPONINI <0.03 <0.03 <0.03  --     Estimated Creatinine Clearance: 111.3 mL/min (by C-G formula based on SCr of 0.8 mg/dL).   Medical History: Past Medical History:  Diagnosis Date  . Asthma   . CHF (congestive heart failure) ([place])   . Hyperlipidemia   . Hypertension   . Myocardial infarction ([place])   . Obesity   . Seizures [facility])     Assessment: 50 yo female brought in for chest pain, nausea and SOB. Pharmacy consulted for heparin drip for concern for ACS.   7/28 1900 Heparin level resulted at <0.1  Goal of Therapy:  Heparin level 0.3-0.7 units/ml Monitor platelets by anticoagulation protocol: Yes   Plan:  Will bolus with Heparin 2600 units IV once and increase rate to 1250 units/hr. Will recheck Heparin level in 6hrs.  Paulina Fusi, PharmD, BCPS 11/05/2015 8:08 PM

## 2015-11-05 NOTE — ED Provider Notes (Signed)
[facility] Emergency Department Provider Note   ____________________________________________   First MD Initiated Contact with Patient 11/05/15 [phone]     (approximate)  I have reviewed the triage vital signs and the nursing notes.   HISTORY  Chief Complaint Chest Pain    HPI Kathy [name] is a 50 y.o. female with history of coronary artery disease status post stents, HTN, HLD previously seen by Dr. Chancy Milroy but has not been evaluated since 2014 who presents for evaluation of chest pain which began in the wee hours of the morning, gradual onset, intermittent, worse with exertion, currently mild to moderate. Patient describes left-sided chest pain which sometimes radiates into the left arm, is associated with nausea as well as some shortness of breath. She was seen in this emergency department at 5 AM, EKG was concerning for a new left bundle branch block and the recommendation was for admission given concern for ACS however she had to leave to fulfill some familial obligations per she returns to this emergency department at this time with recurrent chest pain. No vomiting, and fevers or chills but she has had one episode of diarrhea today.   Past Medical History:  Diagnosis Date  . Asthma   . CHF (congestive heart failure) ([place])   . Hyperlipidemia   . Hypertension   . Myocardial infarction ([place])   . Obesity   . Seizures [facility])     Patient Active Problem List   Diagnosis Date Noted  . Elevated lymphocytes 06/03/2015  . Dyslipidemia 06/03/2015  . Prediabetes 06/03/2015  . History of MI (myocardial infarction) 06/03/2015  . Tobacco abuse 06/03/2015  . Obesity 06/03/2015  . Seizures ([place]) 11/26/2014  . Hypertension 05/21/2014  . CAD (coronary artery disease) 05/21/2014  . Hyperlipidemia 05/21/2014    Past Surgical History:  Procedure Laterality Date  . ANTERIOR CRUCIATE LIGAMENT REPAIR    . BACK SURGERY     L4 and L5  . CARDIAC CATHETERIZATION    .  TONSILLECTOMY AND ADENOIDECTOMY  2008  . TUBAL LIGATION      Prior to Admission medications   Medication Sig Start Date End Date Taking? Authorizing Provider  amLODipine (NORVASC) 5 MG tablet Take 5 mg by mouth daily.   Yes Historical Provider, MD  aspirin EC 81 MG tablet Take 81 mg by mouth daily.   Yes Historical Provider, MD  clopidogrel (PLAVIX) 75 MG tablet Take 75 mg by mouth daily.   Yes Historical Provider, MD  furosemide (LASIX) 20 MG tablet Take 20 mg by mouth daily.   Yes Historical Provider, MD  isosorbide dinitrate (ISORDIL) 30 MG tablet Take 30 mg by mouth daily.   Yes Historical Provider, MD  metoprolol tartrate (LOPRESSOR) 25 MG tablet Take 25 mg by mouth 2 (two) times daily.   Yes Historical Provider, MD  simvastatin (ZOCOR) 20 MG tablet Take 20 mg by mouth daily.   Yes Historical Provider, MD  meclizine (ANTIVERT) 25 MG tablet Take 25 mg by mouth 3 (three) times daily as needed for dizziness.    Historical Provider, MD  nitroGLYCERIN (NITROSTAT) 0.4 MG SL tablet Place 0.4 mg under the tongue every 5 (five) minutes as needed for chest pain.    Historical Provider, MD    Allergies Aspirin; Celebrex [celecoxib]; Percocet [oxycodone-acetaminophen]; Simvastatin-high dose; Skelaxin [metaxalone]; Morphine; and Vicodin [hydrocodone-acetaminophen]  Family History  Problem Relation Age of Onset  . Kidney disease Sister   . Diabetes Sister   . COPD Sister   . Stroke  Sister   . Diabetes Mother   . Stroke Mother   . Heart attack Mother   . Stroke Father   . Asthma Daughter   . Asthma Son     Social History Social History  Substance Use Topics  . Smoking status: Current Every Day Smoker    Packs/day: 0.50    Types: Cigarettes  . Smokeless tobacco: Never Used  . Alcohol use No    Review of Systems Constitutional: No fever/chills Eyes: No visual changes. ENT: No sore throat. Cardiovascular: +chest pain. Respiratory: +shortness of breath. Gastrointestinal: No  abdominal pain.  + nausea, no vomiting.  No diarrhea.  No constipation. Genitourinary: Negative for dysuria. Musculoskeletal: Negative for back pain. Skin: Negative for rash. Neurological: Negative for headaches, focal weakness or numbness.  10-point ROS otherwise negative.  ____________________________________________   PHYSICAL EXAM:  Vitals:   11/05/15 1045 11/05/15 1100 11/05/15 1115 11/05/15 1130  BP:  130/81  122/76  Pulse: 66 62 70 62  Resp: 14 16 17 18   Temp:      TempSrc:      SpO2: 97% 98% 99% 98%  Weight:      Height:        VITAL SIGNS: ED Triage Vitals  Enc Vitals Group     BP      Pulse      Resp      Temp      Temp src      SpO2      Weight      Height      Head Circumference      Peak Flow      Pain Score      Pain Loc      Pain Edu?      Excl. in [place]?     Constitutional: Alert and oriented. Well appearing and in no acute distress. Eyes: Conjunctivae are normal. PERRL. EOMI. Head: Atraumatic. Nose: No congestion/rhinnorhea. Mouth/Throat: Mucous membranes are moist.  Oropharynx non-erythematous. Neck: No stridor.   Cardiovascular: Normal rate, regular rhythm. Grossly normal heart sounds.  Good peripheral circulation. Respiratory: Normal respiratory effort.  No retractions. Lungs CTAB. Gastrointestinal: Soft and nontender. No distention. No CVA tenderness. Genitourinary: deferred Musculoskeletal: No lower extremity tenderness nor edema.  No joint effusions. Neurologic:  Normal speech and language. No gross focal neurologic deficits are appreciated. No gait instability. Skin:  Skin is warm, dry and intact. No rash noted. Psychiatric: Mood and affect are normal. Speech and behavior are normal.  ____________________________________________   LABS (all labs ordered are listed, but only abnormal results are displayed)  Labs Reviewed  CBC WITH DIFFERENTIAL/PLATELET - Abnormal; Notable for the following:       Result Value   RDW 14.6 (*)     All other components within normal limits  COMPREHENSIVE METABOLIC PANEL - Abnormal; Notable for the following:    Glucose, Bld 104 (*)    All other components within normal limits  TROPONIN I  LIPASE, BLOOD  PROTIME-INR  APTT   ____________________________________________  EKG  ED ECG REPORT I, Joanne Gavel, the attending physician, personally viewed and interpreted this ECG.   Date: 11/05/2015  EKG Time: 09:36  Rate: 66  Rhythm: normal sinus rhythm  Axis: normal  Intervals:left bundle branch block  ST&T Change: No acute ST elevation or acute ST depression. Left bundle-branch block is new when compared to the baseline EKG obtained in 2014.  ____________________________________________  RADIOLOGY  Chest x-ray obtained earlier this morning shows  no acute cardio pulmonary disease. ____________________________________________   PROCEDURES  Procedure(s) performed: None  Procedures  Critical Care performed: No  ____________________________________________   INITIAL IMPRESSION / ASSESSMENT AND PLAN / ED COURSE  Pertinent labs & imaging results that were available during my care of the patient were reviewed by me and considered in my medical decision making (see chart for details).  Lyndsea Hinebaugh is a 50 y.o. female with history of coronary artery disease status post stents, previously seen by Dr. Chancy Milroy but has not been evaluated since 2014 who presents for evaluation of chest pain which began in the wee hours of the morning in the setting of new left bundle-branch block. On exam, she is generally well-appearing and in no acute distress. Her vital signs are stable, she is afebrile. EKG did again shows left bundle branch block area labs from this morning are generally unremarkable including initial troponin which was negative. Chest x-ray showed no acute cardiopulmonary disease however given EKG changes, excessive cardiac history, patient warrants admission for chest pain  rule out, cardiology evaluation and possibly provocative testing given concern for ACS. We'll give aspirin, nitroglycerin and anticipate admission.  ----------------------------------------- 12:04 PM on 11/05/2015 ----------------------------------------- Pain resolved with nitroglycerin. I discussed the case with Dr. Clayborn Bigness who agrees with admission, recommends heparin drip which I ordered. I discussed the case with Dr. Margaretmary Eddy, hospitalist, for admission at this time.   Clinical Course     ____________________________________________   FINAL CLINICAL IMPRESSION(S) / ED DIAGNOSES  Final diagnoses:  Chest pain, unspecified chest pain type      NEW MEDICATIONS STARTED DURING THIS VISIT:  New Prescriptions   No medications on file     Note:  This document was prepared using Dragon voice recognition software and may include unintentional dictation errors.    Joanne Gavel, MD 11/05/15 [phone]

## 2015-11-05 NOTE — H&P (Signed)
[facility] [place] NAME: Kathy [name]    MR#:  [id number]  DATE OF BIRTH:  [birth year]  DATE OF ADMISSION:  11/05/2015  PRIMARY CARE PHYSICIAN: Pcp Not In System   REQUESTING/REFERRING PHYSICIAN: Joanne Gavel, MD  CHIEF COMPLAINT:  CHEST PRESSURE  HISTORY OF PRESENT ILLNESS:  Kathy [name]  is a 50 y.o. female with a known history of Coronary artery disease status post MI in the past, currently on aspirin 81 mg, Plavix, last cardiac catheter in November 2014 is presenting to the emergency department with a chief complaint of chest pressure on the left side started at 3 AM. The patient was unable to sleep last night as usual and reports she is under stress .denies any shortness of breath or nausea or vomiting. Sublingual nitroglycerin glycerin was given by the EMS which improved her symptoms .during my examination she is resting comfortably. EKG in the emergency department has revealed a new left bundle branch block was not there when compared to the old EKG in November 2014 .emergency department physician Dr. Edd Fabian has discussed with on-call cardiology Dr. Clayborn Bigness as patient's primary cardiologist Dr. Humphrey Rolls is out of town . Dr. Clayborn Bigness  has recommended heparin drip and also continue her home medication baby aspirin and Plavix 75 mg   PAST MEDICAL HISTORY:   Past Medical History:  Diagnosis Date  . Asthma   . CHF (congestive heart failure) ([place])   . Hyperlipidemia   . Hypertension   . Myocardial infarction ([place])   . Obesity   . Seizures ([place])     PAST SURGICAL HISTOIRY:   Past Surgical History:  Procedure Laterality Date  . ANTERIOR CRUCIATE LIGAMENT REPAIR    . BACK SURGERY     L4 and L5  . CARDIAC CATHETERIZATION    . TONSILLECTOMY AND ADENOIDECTOMY  2008  . TUBAL LIGATION      SOCIAL HISTORY:   Social History  Substance Use Topics  . Smoking status: Current Every Day Smoker    Packs/day: 0.50    Types: Cigarettes   . Smokeless tobacco: Never Used  . Alcohol use No    FAMILY HISTORY:   Family History  Problem Relation Age of Onset  . Kidney disease Sister   . Diabetes Sister   . COPD Sister   . Stroke Sister   . Diabetes Mother   . Stroke Mother   . Heart attack Mother   . Stroke Father   . Asthma Daughter   . Asthma Son     DRUG ALLERGIES:   Allergies  Allergen Reactions  . Aspirin Anaphylaxis  . Celebrex [Celecoxib] Anaphylaxis and Hives  . Percocet [Oxycodone-Acetaminophen] Shortness Of Breath  . Simvastatin-High Dose Hives and Shortness Of Breath    Can tolerate 20 mg dose.   Jonah Blue [Metaxalone] Anaphylaxis  . Morphine Swelling  . Vicodin [Hydrocodone-Acetaminophen] Hives and Other (See Comments)    Feels like her tongue swells    REVIEW OF SYSTEMS:  CONSTITUTIONAL: No fever, fatigue or weakness.  EYES: No blurred or double vision.  EARS, NOSE, AND THROAT: No tinnitus or ear pain.  RESPIRATORY: No cough, shortness of breath, wheezing or hemoptysis.  CARDIOVASCULAR: Reports left-sided chest pressure, denies orthopnea, edema.  GASTROINTESTINAL: No nausea, vomiting, diarrhea or abdominal pain.  GENITOURINARY: No dysuria, hematuria.  ENDOCRINE: No polyuria, nocturia,  HEMATOLOGY: No anemia, easy bruising or bleeding SKIN: No rash or lesion. MUSCULOSKELETAL: No joint pain or arthritis.  NEUROLOGIC: No tingling, numbness, weakness.  PSYCHIATRY: No anxiety or depression.   MEDICATIONS AT HOME:   Prior to Admission medications   Medication Sig Start Date End Date Taking? Authorizing Provider  amLODipine (NORVASC) 5 MG tablet Take 5 mg by mouth daily.   Yes Historical Provider, MD  aspirin EC 81 MG tablet Take 81 mg by mouth daily.   Yes Historical Provider, MD  clopidogrel (PLAVIX) 75 MG tablet Take 75 mg by mouth daily.   Yes Historical Provider, MD  furosemide (LASIX) 20 MG tablet Take 20 mg by mouth daily.   Yes Historical Provider, MD  isosorbide dinitrate  (ISORDIL) 30 MG tablet Take 30 mg by mouth daily.   Yes Historical Provider, MD  metoprolol tartrate (LOPRESSOR) 25 MG tablet Take 25 mg by mouth 2 (two) times daily.   Yes Historical Provider, MD  simvastatin (ZOCOR) 20 MG tablet Take 20 mg by mouth daily.   Yes Historical Provider, MD  meclizine (ANTIVERT) 25 MG tablet Take 25 mg by mouth 3 (three) times daily as needed for dizziness.    Historical Provider, MD  nitroGLYCERIN (NITROSTAT) 0.4 MG SL tablet Place 0.4 mg under the tongue every 5 (five) minutes as needed for chest pain.    Historical Provider, MD      VITAL SIGNS:  Blood pressure 122/76, pulse 62, temperature 97.8 F (36.6 C), temperature source Oral, resp. rate 18, height 5\' 7"  (1.702 m), weight 113.4 kg (250 lb), last menstrual period 10/23/2015, SpO2 98 %.  PHYSICAL EXAMINATION:  GENERAL:  50 y.o.-year-old patient lying in the bed with no acute distress.  EYES: Pupils equal, round, reactive to light and accommodation. No scleral icterus. Extraocular muscles intact.  HEENT: Head atraumatic, normocephalic. Oropharynx and nasopharynx clear.  NECK:  Supple, no jugular venous distention. No thyroid enlargement, no tenderness.  LUNGS: Normal breath sounds bilaterally, no wheezing, rales,rhonchi or crepitation. No use of accessory muscles of respiration.  CARDIOVASCULAR: S1, S2 normal. No murmurs, rubs, or gallops.  no anterior chest wall tenderness on palpation but feeling left hand heaviness  ABDOMEN: Soft, nontender, nondistended. Bowel sounds present. No organomegaly or mass.  EXTREMITIES: No pedal edema, cyanosis, or clubbing.  NEUROLOGIC: Cranial nerves II through XII are intact. Muscle strength 5/5 in all extremities. Sensation intact. Gait not checked. No dysarthria PSYCHIATRIC: The patient is alert and oriented x 3.  SKIN: No obvious rash, lesion, or ulcer.   LABORATORY PANEL:   CBC  Recent Labs Lab 11/05/15 0945  WBC 6.6  HGB 14.9  HCT 43.4  PLT 216    ------------------------------------------------------------------------------------------------------------------  Chemistries   Recent Labs Lab 11/05/15 0945  NA 138  K 3.7  CL 107  CO2 23  GLUCOSE 104*  BUN 11  CREATININE 0.69  CALCIUM 9.1  AST 23  ALT 23  ALKPHOS 84  BILITOT 0.7   ------------------------------------------------------------------------------------------------------------------  Cardiac Enzymes  Recent Labs Lab 11/05/15 0945  TROPONINI <0.03   ------------------------------------------------------------------------------------------------------------------  RADIOLOGY:  Dg Chest 2 View  Result Date: 11/05/2015 CLINICAL DATA:  50 year old female with chest pain EXAM: CHEST  2 VIEW COMPARISON:  Chest radiograph dated 02/28/2013 FINDINGS: Two views of the chest do not demonstrate a focal consolidation. There is mild diffuse interstitial coarsening, likely chronic. There is no pleural effusion or pneumothorax. The cardiac silhouette is within normal limits. No acute osseous pathology. IMPRESSION: No active cardiopulmonary disease. Electronically Signed   By: Anner Crete M.D.   On: 11/05/2015 05:04   EKG:   Orders placed  or performed during the hospital encounter of 11/05/15  . EKG 12-Lead  . EKG 12-Lead    IMPRESSION AND PLAN:   Kathy [name]  is a 50 y.o. female with a known history of Coronary artery disease status post MI in the past, currently on aspirin 81 mg, Plavix, last cardiac catheter in November 2014 is presenting to the emergency department with a chief complaint of chest pressure on the left side started at 3 AM.EKG has revealed new left bundle branch block    #Left-sided chest pain with new left bundle branch block on EKG with a past medical history of coronary artery disease and MI  Admit patient under observation status to telemetry Cycle cardiac biomarkers Patient is started on heparin drip Will continue her home medications  baby aspirin and Plavix 75 mg as recommended by Dr. Clayborn Bigness Continue home medication statin 20 mg Check TSH in a.m. and monitor blood counts closely Continue her home medications Imdur  #Essential hypertension continue amlodipine and metoprolol is at her home medications and titrate as needed  #Hyperlipidemia continue Zocor 20 mg by mouth once daily to her home medication and check fasting lipid panel  #History of smoking I have counseled patient to quit smoking for 3 minutes. The patient is agreeable with nicotine patch, will start nicotine patch after ruling out acute MI    DVT prophylaxis-on heparin drip  Discussed with on-call cardiology Dr. Clayborn Bigness Discussed with patient and her fianc bedside. They both verbalized understanding of the plan.    All the records are reviewed and case discussed with ED provider. Management plans discussed with the patient, family and they are in agreement.  CODE STATUS:fc / son and daughter POA  TOTAL TIME TAKING CARE OF THIS PATIENT: 42 minutes.   Note: This dictation was prepared with Dragon dictation along with smaller phrase technology. Any transcriptional errors that result from this process are unintentional.  Nicholes Mango M.D on 11/05/2015 at 1:05 PM  Between 7am to 6pm - Pager - [phone]  After 6pm go to [email or web address] - password [email or web address] [facility]  Office  [phone]  CC: Primary care physician; Pcp Not In System

## 2015-11-05 NOTE — ED Notes (Signed)
Kathy [name] called for report

## 2015-11-05 NOTE — ED Notes (Signed)
Informed RN bed ready  1341

## 2015-11-05 NOTE — ED Notes (Signed)
MD and RN at bedside. Pt to be admitted and informs staff she can not stay due to family at home on peritoneal dialysis. Pt is educated on the risk she is taking by signing out AMA and is in compliance to the risk.

## 2015-11-05 NOTE — ED Triage Notes (Signed)
Pt arrived from home with numbness to the left side, heaviness to the left side of chest and seeing spots in the left eye. EMS reports pt has cardiac hx and has had 2 heart attacks previously. Pt in no distress upon arrival and EKG with EMS was unremarkable. Pt A&Ox4.

## 2015-11-05 NOTE — ED Notes (Signed)
The EKG was completed. Dr. Edd Fabian signed the EKG. The EKG was exported into the system.

## 2015-11-05 NOTE — Progress Notes (Signed)
ANTICOAGULATION CONSULT NOTE - Initial Consult  Pharmacy Consult for Heparin Drip Indication: chest pain/ACS  Allergies  Allergen Reactions  . Aspirin Anaphylaxis  . Celebrex [Celecoxib] Anaphylaxis and Hives  . Percocet [Oxycodone-Acetaminophen] Shortness Of Breath  . Simvastatin-High Dose Hives and Shortness Of Breath    Can tolerate 20 mg dose.   Jonah Blue [Metaxalone] Anaphylaxis  . Morphine Swelling  . Vicodin [Hydrocodone-Acetaminophen] Hives and Other (See Comments)    Feels like her tongue swells    Patient Measurements: Height: 5\' 7"  (170.2 cm) Weight: 250 lb (113.4 kg) IBW/kg (Calculated) : 61.6 Heparin Dosing Weight: 72kg  Vital Signs: Temp: 97.8 F (36.6 C) (07/28 0942) Temp Source: Oral (07/28 0942) BP: 122/76 (07/28 1130) Pulse Rate: 62 (07/28 1130)  Labs:  Recent Labs  11/05/15 0414 11/05/15 0945  HGB 14.8 14.9  HCT 45.4 43.4  PLT 210 216  APTT  --  30  LABPROT  --  13.1  INR  --  0.99  CREATININE 0.81 0.69  TROPONINI <0.03 <0.03    Estimated Creatinine Clearance: 110.5 mL/min (by C-G formula based on SCr of 0.8 mg/dL).   Medical History: Past Medical History:  Diagnosis Date  . Asthma   . CHF (congestive heart failure) ([place])   . Hyperlipidemia   . Hypertension   . Myocardial infarction ([place])   . Obesity   . Seizures [facility])     Assessment: 50 yo female brought in for chest pain, nausea and SOB. Pharmacy consulted for heparin drip for concern for ACS.   Goal of Therapy:  Heparin level 0.3-0.7 units/ml Monitor platelets by anticoagulation protocol: Yes   Plan:  Patients DW: 72kg  Give 4000 units bolus x 1 Start heparin infusion at 900 units/hr Check anti-Xa level in 6 hours and daily while on heparin Continue to monitor H&H and platelets  Nancy Fetter, PharmD Clinical Pharmacist 11/05/2015 12:47 PM

## 2015-11-05 NOTE — ED Notes (Signed)
Pt assisted to toilet. NAD noted.

## 2015-11-06 LAB — CBC
HCT: 41.4 % (ref 35.0–47.0)
Hemoglobin: 14 g/dL (ref 12.0–16.0)
MCH: 29.3 pg (ref 26.0–34.0)
MCHC: 33.9 g/dL (ref 32.0–36.0)
MCV: 86.5 fL (ref 80.0–100.0)
PLATELETS: 208 10*3/uL (ref 150–440)
RBC: 4.79 MIL/uL (ref 3.80–5.20)
RDW: 14.7 % — AB (ref 11.5–14.5)
WBC: 7.8 10*3/uL (ref 3.6–11.0)

## 2015-11-06 LAB — COMPREHENSIVE METABOLIC PANEL
ALBUMIN: 3.6 g/dL (ref 3.5–5.0)
ALT: 20 U/L (ref 14–54)
AST: 20 U/L (ref 15–41)
Alkaline Phosphatase: 71 U/L (ref 38–126)
Anion gap: 5 (ref 5–15)
BUN: 13 mg/dL (ref 6–20)
CHLORIDE: 107 mmol/L (ref 101–111)
CO2: 27 mmol/L (ref 22–32)
CREATININE: 0.66 mg/dL (ref 0.44–1.00)
Calcium: 9 mg/dL (ref 8.9–10.3)
GFR calc Af Amer: >60 mL/min (ref >60)
GFR calc non Af Amer: >60 mL/min (ref >60)
GLUCOSE: 101 mg/dL — AB (ref 65–99)
POTASSIUM: 4.1 mmol/L (ref 3.5–5.1)
Sodium: 139 mmol/L (ref 135–145)
Total Bilirubin: 0.3 mg/dL (ref 0.3–1.2)
Total Protein: 6.5 g/dL (ref 6.5–8.1)

## 2015-11-06 LAB — HEPARIN LEVEL (UNFRACTIONATED)
HEPARIN UNFRACTIONATED: 0.26 [IU]/mL — AB (ref 0.30–0.70)
Heparin Unfractionated: 0.31 IU/mL (ref 0.30–0.70)

## 2015-11-06 LAB — TSH: TSH: 1.197 u[IU]/mL (ref 0.350–4.500)

## 2015-11-06 LAB — TROPONIN I

## 2015-11-06 LAB — HEMOGLOBIN A1C: Hgb A1c MFr Bld: 5.2 % (ref 4.0–6.0)

## 2015-11-06 MED ORDER — HEPARIN BOLUS VIA INFUSION
1100.0000 [IU] | Freq: Once | INTRAVENOUS | Status: AC
  Administered 2015-11-06: 1100 [IU] via INTRAVENOUS
  Filled 2015-11-06: qty 1100

## 2015-11-06 NOTE — Progress Notes (Addendum)
ANTICOAGULATION CONSULT NOTE -follow [place] for Heparin Drip Indication: chest pain/ACS  Allergies  Allergen Reactions  . Aspirin Anaphylaxis  . Celebrex [Celecoxib] Anaphylaxis and Hives  . Percocet [Oxycodone-Acetaminophen] Shortness Of Breath  . Simvastatin-High Dose Hives and Shortness Of Breath    Can tolerate 20 mg dose.   Jonah Blue [Metaxalone] Anaphylaxis  . Morphine Swelling  . Vicodin [Hydrocodone-Acetaminophen] Hives and Other (See Comments)    Feels like her tongue swells    Patient Measurements: Height: 5\' 7"  (170.2 cm) Weight: 253 lb 4.8 oz (114.9 kg) IBW/kg (Calculated) : 61.6 Heparin Dosing Weight: 72kg  Vital Signs: Temp: 97.4 F (36.3 C) (07/29 0626) Temp Source: Oral (07/29 0626) BP: 123/67 (07/29 0955) Pulse Rate: 65 (07/29 0955)  Labs:  Recent Labs  11/05/15 0414 11/05/15 0945 11/05/15 1425 11/05/15 1842 11/05/15 2016 11/06/15 0222 11/06/15 0954  HGB 14.8 14.9  --   --   --  14.0  --   HCT 45.4 43.4  --   --   --  41.4  --   PLT 210 216  --   --   --  208  --   APTT  --  30  --   --   --   --   --   LABPROT  --  13.1  --   --   --   --   --   INR  --  0.99  --   --   --   --   --   HEPARINUNFRC  --   --   --  <0.10*  --  0.26* 0.31  CREATININE 0.81 0.69  --   --   --  0.66  --   TROPONINI <0.03 <0.03 <0.03  --  <0.03 <0.03  --     Estimated Creatinine Clearance: 111.3 mL/min (by C-G formula based on SCr of 0.8 mg/dL).   Medical History: Past Medical History:  Diagnosis Date  . Asthma   . CHF (congestive heart failure) ([place])   . Hyperlipidemia   . Hypertension   . Myocardial infarction ([place])   . Obesity   . Seizures [facility])     Assessment: 50 yo female brought in for chest pain, nausea and SOB. Pharmacy consulted for heparin drip for concern for ACS.   7/28 1900 Heparin level resulted at <0.1  Goal of Therapy:  Heparin level 0.3-0.7 units/ml Monitor platelets by anticoagulation protocol: Yes   Plan:   Will bolus with Heparin 2600 units IV once and increase rate to 1250 units/hr. Will recheck Heparin level in 6hrs.  7/29 02:00 heparin level 0.26. 1100 unit bolus and increase rate to 1400 units/hr. Recheck in 6 hours.  7/29 HL at 0954= 0.31.  Continue current rate and check confirmatory level in 6 hrs at 1600.  Chinita Greenland PharmD Clinical Pharmacist 11/06/2015 11:11 AM

## 2015-11-06 NOTE — Progress Notes (Signed)
MD notified. Pts HR is sustaining in the 40s. VSS. Order to discontinue metoprolol and monitor the patient. I will continue to monitor.

## 2015-11-06 NOTE — Progress Notes (Signed)
ANTICOAGULATION CONSULT NOTE - Initial Consult  Pharmacy Consult for Heparin Drip Indication: chest pain/ACS  Allergies  Allergen Reactions  . Aspirin Anaphylaxis  . Celebrex [Celecoxib] Anaphylaxis and Hives  . Percocet [Oxycodone-Acetaminophen] Shortness Of Breath  . Simvastatin-High Dose Hives and Shortness Of Breath    Can tolerate 20 mg dose.   Jonah Blue [Metaxalone] Anaphylaxis  . Morphine Swelling  . Vicodin [Hydrocodone-Acetaminophen] Hives and Other (See Comments)    Feels like her tongue swells    Patient Measurements: Height: 5\' 7"  (170.2 cm) Weight: 253 lb 4.8 oz (114.9 kg) IBW/kg (Calculated) : 61.6 Heparin Dosing Weight: 72kg  Vital Signs: Temp: 97.8 F (36.6 C) (07/28 1931) Temp Source: Oral (07/28 1931) BP: 131/65 (07/28 1931) Pulse Rate: 59 (07/28 1931)  Labs:  Recent Labs  11/05/15 0414 11/05/15 0945 11/05/15 1425 11/05/15 1842 11/05/15 2016 11/06/15 0222  HGB 14.8 14.9  --   --   --  14.0  HCT 45.4 43.4  --   --   --  41.4  PLT 210 216  --   --   --  208  APTT  --  30  --   --   --   --   LABPROT  --  13.1  --   --   --   --   INR  --  0.99  --   --   --   --   HEPARINUNFRC  --   --   --  <0.10*  --  0.26*  CREATININE 0.81 0.69  --   --   --   --   TROPONINI <0.03 <0.03 <0.03  --  <0.03 <0.03    Estimated Creatinine Clearance: 111.3 mL/min (by C-G formula based on SCr of 0.8 mg/dL).   Medical History: Past Medical History:  Diagnosis Date  . Asthma   . CHF (congestive heart failure) ([place])   . Hyperlipidemia   . Hypertension   . Myocardial infarction ([place])   . Obesity   . Seizures [facility])     Assessment: 50 yo female brought in for chest pain, nausea and SOB. Pharmacy consulted for heparin drip for concern for ACS.   7/28 1900 Heparin level resulted at <0.1  Goal of Therapy:  Heparin level 0.3-0.7 units/ml Monitor platelets by anticoagulation protocol: Yes   Plan:  Will bolus with Heparin 2600 units IV once and increase  rate to 1250 units/hr. Will recheck Heparin level in 6hrs.  7/29 02:00 heparin level 0.26. 1100 unit bolus and increase rate to 1400 units/hr. Recheck in 6 hours.  Paulina Fusi, PharmD, BCPS 11/06/2015 3:37 AM

## 2015-11-06 NOTE — Progress Notes (Signed)
Evening metoprolol dose given when pt's HR was in the 70s. Pt's HR now maintaining in the 40s. Pt is asymptomatic, resting comfortably. MD Dr. Marcille [place] notified, no new orders. RN will continue to monitor. Rachael Fee, RN

## 2015-11-06 NOTE — Progress Notes (Signed)
MD notified RN and placed a discharge order. I will continue to assess.

## 2015-11-08 NOTE — Discharge Summary (Signed)
[facility] [place] NAME: Kathy [name]    MR#:  [id number]  DATE OF BIRTH:  1965-06-28  DATE OF ADMISSION:  11/05/2015 ADMITTING PHYSICIAN: Nicholes Mango, MD  DATE OF DISCHARGE: 11/06/2015  6:53 PM  PRIMARY CARE PHYSICIAN: Pcp Not In System   ADMISSION DIAGNOSIS:  Chest pain, unspecified chest pain type [R07.9]  DISCHARGE DIAGNOSIS:  Active Problems:   New left bundle branch block (LBBB)   SECONDARY DIAGNOSIS:   Past Medical History:  Diagnosis Date  . Asthma   . CHF (congestive heart failure) ([place])   . Hyperlipidemia   . Hypertension   . Myocardial infarction ([place])   . Obesity   . Seizures ([place])      ADMITTING HISTORY  Kathy [name]  is a 50 y.o. female with a known history of Coronary artery disease status post MI in the past, currently on aspirin 81 mg, Plavix, last cardiac catheter in November 2014 is presenting to the emergency department with a chief complaint of chest pressure on the left side started at 3 AM. The patient was unable to sleep last night as usual and reports she is under stress .denies any shortness of breath or nausea or vomiting. Sublingual nitroglycerin glycerin was given by the EMS which improved her symptoms .during my examination she is resting comfortably. EKG in the emergency department has revealed a new left bundle branch block was not there when compared to the old EKG in November 2014 .emergency department physician Dr. Edd Fabian has discussed with on-call cardiology Dr. Clayborn Bigness as patient's primary cardiologist Dr. Humphrey Rolls is out of town . Dr. Clayborn Bigness  has recommended heparin drip and also continue her home medication baby aspirin and Plavix 75 mg   HOSPITAL COURSE:   * Atypical chest pain Patient was admitted onto telemetry floor. Troponin checked 3 times was normal. EKG initially was concerning for left bundle branch block. But repeat EKG showed PVCs with normal sinus rhythm. Patient was seen by  Dr. Clayborn Bigness of cardiology who I discussed the case with. He did not feel this was angina. Suggested discharging patient home to follow-up with her cardiologist Dr. Humphrey Rolls in one week. Continue all home medications along with when necessary nitroglycerin.  Stable for discharge home to follow-up with cardiology.  CONSULTS OBTAINED:  Treatment Team:  Yolonda Kida, MD  DRUG ALLERGIES:   Allergies  Allergen Reactions  . Aspirin Anaphylaxis  . Celebrex [Celecoxib] Anaphylaxis and Hives  . Percocet [Oxycodone-Acetaminophen] Shortness Of Breath  . Simvastatin-High Dose Hives and Shortness Of Breath    Can tolerate 20 mg dose.   Jonah Blue [Metaxalone] Anaphylaxis  . Morphine Swelling  . Vicodin [Hydrocodone-Acetaminophen] Hives and Other (See Comments)    Feels like her tongue swells    DISCHARGE MEDICATIONS:   Discharge Medication List as of 11/06/2015  3:50 PM    CONTINUE these medications which have NOT CHANGED   Details  amLODipine (NORVASC) 5 MG tablet Take 5 mg by mouth daily., Historical Med    aspirin EC 81 MG tablet Take 81 mg by mouth daily., Historical Med    clopidogrel (PLAVIX) 75 MG tablet Take 75 mg by mouth daily., Historical Med    furosemide (LASIX) 20 MG tablet Take 20 mg by mouth daily., Historical Med    isosorbide mononitrate (IMDUR) 30 MG 24 hr tablet Take 30 mg by mouth daily., Historical Med    metoprolol tartrate (LOPRESSOR) 25 MG tablet Take 25 mg by mouth  2 (two) times daily., Historical Med    simvastatin (ZOCOR) 20 MG tablet Take 20 mg by mouth daily., Historical Med    meclizine (ANTIVERT) 25 MG tablet Take 25 mg by mouth 3 (three) times daily as needed for dizziness., Historical Med    nitroGLYCERIN (NITROSTAT) 0.4 MG SL tablet Place 0.4 mg under the tongue every 5 (five) minutes as needed for chest pain., Historical Med        Today   VITAL SIGNS:  Blood pressure 111/69, pulse (!) 50, temperature 97.8 F (36.6 C), temperature source  Oral, resp. rate 20, height 5\' 7"  (1.702 m), weight 115 kg (253 lb 8 oz), last menstrual period 10/23/2015, SpO2 98 %.  I/O:  No intake or output data in the 24 hours ending 11/08/15 1212  PHYSICAL EXAMINATION:  Physical Exam  GENERAL:  50 y.o.-year-old patient lying in the bed with no acute distress.  LUNGS: Normal breath sounds bilaterally, no wheezing, rales,rhonchi or crepitation. No use of accessory muscles of respiration.  CARDIOVASCULAR: S1, S2 normal. No murmurs, rubs, or gallops.  ABDOMEN: Soft, non-tender, non-distended. Bowel sounds present. No organomegaly or mass.  NEUROLOGIC: Moves all 4 extremities. PSYCHIATRIC: The patient is alert and oriented x 3.  SKIN: No obvious rash, lesion, or ulcer.   DATA REVIEW:   CBC  Recent Labs Lab 11/06/15 0222  WBC 7.8  HGB 14.0  HCT 41.4  PLT 208    Chemistries   Recent Labs Lab 11/06/15 0222  NA 139  K 4.1  CL 107  CO2 27  GLUCOSE 101*  BUN 13  CREATININE 0.66  CALCIUM 9.0  AST 20  ALT 20  ALKPHOS 71  BILITOT 0.3    Cardiac Enzymes  Recent Labs Lab 11/06/15 0222  TROPONINI <0.03    Microbiology Results  No results found for this or any previous visit.  RADIOLOGY:  No results found.  Follow up with PCP in 1 week.  Management plans discussed with the patient, family and they are in agreement.  CODE STATUS:  Code Status History    Date Active Date Inactive Code Status Order ID Comments User Context   11/05/2015  2:24 PM 11/06/2015  9:53 PM Full Code [id number]  Nicholes Mango, MD Inpatient      TOTAL TIME TAKING CARE OF THIS PATIENT ON DAY OF DISCHARGE: more than 30 minutes.   Hillary Bow R M.D on 11/08/2015 at 12:12 PM  Between 7am to 6pm - Pager - [phone]  After 6pm go to [email or web address] - password [email or web address] [place] [facility]  Office  [phone]  CC: Primary care physician; Pcp Not In System  Note: This dictation was prepared with Dragon dictation along with smaller  phrase technology. Any transcriptional errors that result from this process are unintentional.

## 2015-11-11 ENCOUNTER — Other Ambulatory Visit: Payer: Self-pay

## 2015-11-11 DIAGNOSIS — R7303 Prediabetes: Secondary | ICD-10-CM

## 2015-11-11 DIAGNOSIS — E785 Hyperlipidemia, unspecified: Secondary | ICD-10-CM

## 2015-11-12 LAB — COMPREHENSIVE METABOLIC PANEL
ALK PHOS: 85 IU/L (ref 39–117)
ALT: 17 IU/L (ref 0–32)
AST: 11 IU/L (ref 0–40)
Albumin/Globulin Ratio: 1.6 (ref 1.2–2.2)
Albumin: 4.2 g/dL (ref 3.5–5.5)
BUN/Creatinine Ratio: 17 (ref 9–23)
BUN: 13 mg/dL (ref 6–24)
Bilirubin Total: 0.2 mg/dL (ref 0.0–1.2)
CO2: 26 mmol/L (ref 18–29)
CREATININE: 0.75 mg/dL (ref 0.57–1.00)
Calcium: 9.6 mg/dL (ref 8.7–10.2)
Chloride: 103 mmol/L (ref 96–106)
GFR calc Af Amer: 108 mL/min/{1.73_m2} (ref >59)
GFR calc non Af Amer: 94 mL/min/{1.73_m2} (ref >59)
GLUCOSE: 94 mg/dL (ref 65–99)
Globulin, Total: 2.7 g/dL (ref 1.5–4.5)
Potassium: 5.1 mmol/L (ref 3.5–5.2)
Sodium: 145 mmol/L — ABNORMAL HIGH (ref 134–144)
Total Protein: 6.9 g/dL (ref 6.0–8.5)

## 2015-11-12 LAB — LIPID PANEL
CHOLESTEROL TOTAL: 144 mg/dL (ref 100–199)
Chol/HDL Ratio: 6.9 ratio units — ABNORMAL HIGH (ref 0.0–4.4)
HDL: 21 mg/dL — ABNORMAL LOW (ref >39)
LDL Calculated: 80 mg/dL (ref 0–99)
Triglycerides: 214 mg/dL — ABNORMAL HIGH (ref 0–149)
VLDL CHOLESTEROL CAL: 43 mg/dL — AB (ref 5–40)

## 2015-11-12 LAB — CBC WITH DIFFERENTIAL/PLATELET
BASOS ABS: 0 10*3/uL (ref 0.0–0.2)
Basos: 1 %
EOS (ABSOLUTE): 0.2 10*3/uL (ref 0.0–0.4)
Eos: 3 %
HEMOGLOBIN: 13.6 g/dL (ref 11.1–15.9)
Hematocrit: 41.2 % (ref 34.0–46.6)
IMMATURE GRANS (ABS): 0 10*3/uL (ref 0.0–0.1)
Immature Granulocytes: 0 %
LYMPHS ABS: 2.5 10*3/uL (ref 0.7–3.1)
LYMPHS: 30 %
MCH: 28.9 pg (ref 26.6–33.0)
MCHC: 33 g/dL (ref 31.5–35.7)
MCV: 88 fL (ref 79–97)
MONOCYTES: 7 %
Monocytes Absolute: 0.6 10*3/uL (ref 0.1–0.9)
NEUTROS ABS: 5 10*3/uL (ref 1.4–7.0)
Neutrophils: 59 %
Platelets: 256 10*3/uL (ref 150–379)
RBC: 4.71 x10E6/uL (ref 3.77–5.28)
RDW: 14.5 % (ref 12.3–15.4)
WBC: 8.3 10*3/uL (ref 3.4–10.8)

## 2015-11-12 LAB — HEMOGLOBIN A1C
ESTIMATED AVERAGE GLUCOSE: 120 mg/dL
HEMOGLOBIN A1C: 5.8 % — AB (ref 4.8–5.6)

## 2015-11-18 ENCOUNTER — Ambulatory Visit: Payer: Self-pay | Admitting: Nurse Practitioner

## 2015-11-18 VITALS — BP 139/88 | HR 84 | Temp 98.3°F | Resp 16 | Ht 67.0 in | Wt 253.0 lb

## 2015-11-18 DIAGNOSIS — I10 Essential (primary) hypertension: Secondary | ICD-10-CM

## 2015-11-18 DIAGNOSIS — E782 Mixed hyperlipidemia: Secondary | ICD-10-CM

## 2015-11-18 NOTE — Progress Notes (Unsigned)
Pt reports a lot of stress  Best friend lost a grand child, cause of death has not been determined. Sister has been ill, diagnosed with lung cancer in march.  Has been too weak to do a biopsy, planning to do procedure in near future Pt is her sister's caregiver, including her PD dialysis, and O2 has 2 NA's per day to help with personal care.    Has stopped all soda and kool ade Only 5 cigarettes per day   Has decreased snacking.  And lost weight.    Last labs excelleent with a1c at 5.8 triglycerides were elevated at 214, but cholesterol and LDL within acceptable limits.    BP elevated tonight, but pt stated she hurried to get here.    EXAM:  Alert verbally appropriate, in NAD, soft submandibular adenopathy, no thyromegaly, no carotid bruits.    AP rrr,   Trace BLE edema at ankles.         Plan:  Will recommend a cardiology referral, as pt has not seen a cardiologist within the last several years  Eye exam when due.    Will order labs to be done in 3 to 4 months.   Will continue current meds.

## 2015-12-02 ENCOUNTER — Other Ambulatory Visit: Payer: Self-pay

## 2015-12-02 DIAGNOSIS — I10 Essential (primary) hypertension: Secondary | ICD-10-CM

## 2015-12-02 MED ORDER — METOPROLOL TARTRATE 25 MG PO TABS: 25.0000 mg | ORAL_TABLET | Freq: Two times a day (BID) | ORAL | 3 refills | Status: DC

## 2015-12-09 ENCOUNTER — Other Ambulatory Visit: Payer: Self-pay

## 2015-12-09 DIAGNOSIS — I10 Essential (primary) hypertension: Secondary | ICD-10-CM

## 2015-12-09 DIAGNOSIS — E782 Mixed hyperlipidemia: Secondary | ICD-10-CM

## 2015-12-10 LAB — COMPREHENSIVE METABOLIC PANEL
ALBUMIN: 4.2 g/dL (ref 3.5–5.5)
ALK PHOS: 89 IU/L (ref 39–117)
ALT: 18 IU/L (ref 0–32)
AST: 15 IU/L (ref 0–40)
Albumin/Globulin Ratio: 1.6 (ref 1.2–2.2)
BUN / CREAT RATIO: 12 (ref 9–23)
BUN: 7 mg/dL (ref 6–24)
Bilirubin Total: <0.2 mg/dL (ref 0.0–1.2)
CALCIUM: 9.2 mg/dL (ref 8.7–10.2)
CO2: 24 mmol/L (ref 18–29)
CREATININE: 0.59 mg/dL (ref 0.57–1.00)
Chloride: 102 mmol/L (ref 96–106)
GFR calc Af Amer: 124 mL/min/{1.73_m2} (ref >59)
GFR, EST NON AFRICAN AMERICAN: 108 mL/min/{1.73_m2} (ref >59)
GLUCOSE: 89 mg/dL (ref 65–99)
Globulin, Total: 2.7 g/dL (ref 1.5–4.5)
Potassium: 4.9 mmol/L (ref 3.5–5.2)
Sodium: 141 mmol/L (ref 134–144)
TOTAL PROTEIN: 6.9 g/dL (ref 6.0–8.5)

## 2015-12-10 LAB — LIPID PANEL
Chol/HDL Ratio: 5.3 ratio units — ABNORMAL HIGH (ref 0.0–4.4)
Cholesterol, Total: 139 mg/dL (ref 100–199)
HDL: 26 mg/dL — ABNORMAL LOW (ref >39)
LDL Calculated: 81 mg/dL (ref 0–99)
Triglycerides: 158 mg/dL — ABNORMAL HIGH (ref 0–149)
VLDL Cholesterol Cal: 32 mg/dL (ref 5–40)

## 2015-12-15 ENCOUNTER — Other Ambulatory Visit: Payer: Self-pay

## 2015-12-15 DIAGNOSIS — E785 Hyperlipidemia, unspecified: Secondary | ICD-10-CM

## 2015-12-15 DIAGNOSIS — I10 Essential (primary) hypertension: Secondary | ICD-10-CM

## 2015-12-16 ENCOUNTER — Ambulatory Visit: Payer: Self-pay | Admitting: Urology

## 2015-12-16 DIAGNOSIS — I10 Essential (primary) hypertension: Secondary | ICD-10-CM

## 2015-12-16 MED ORDER — AMLODIPINE BESYLATE 5 MG PO TABS: 5.0000 mg | ORAL_TABLET | Freq: Every day | ORAL | 6 refills | Status: DC

## 2015-12-16 MED ORDER — FLUTICASONE-SALMETEROL 100-50 MCG/DOSE IN AEPB: 1.0000 | INHALATION_SPRAY | Freq: Two times a day (BID) | RESPIRATORY_TRACT | 6 refills | Status: DC

## 2015-12-16 MED ORDER — NITROGLYCERIN 0.4 MG SL SUBL: 0.4000 mg | SUBLINGUAL_TABLET | SUBLINGUAL | 6 refills | Status: DC | PRN

## 2015-12-16 MED ORDER — CLOPIDOGREL BISULFATE 75 MG PO TABS: 75.0000 mg | ORAL_TABLET | Freq: Every day | ORAL | 6 refills | Status: DC

## 2015-12-16 MED ORDER — FUROSEMIDE 20 MG PO TABS: 20.0000 mg | ORAL_TABLET | Freq: Every day | ORAL | 6 refills | Status: DC

## 2015-12-16 MED ORDER — MECLIZINE HCL 25 MG PO TABS: 25.0000 mg | ORAL_TABLET | Freq: Three times a day (TID) | ORAL | 6 refills | Status: DC | PRN

## 2015-12-16 MED ORDER — SIMVASTATIN 20 MG PO TABS: 20.0000 mg | ORAL_TABLET | Freq: Every day | ORAL | 6 refills | Status: DC

## 2015-12-16 MED ORDER — ISOSORBIDE MONONITRATE ER 30 MG PO TB24: 30.0000 mg | ORAL_TABLET | Freq: Every day | ORAL | 6 refills | Status: DC

## 2015-12-16 MED ORDER — EPINEPHRINE 0.3 MG/0.3ML IJ SOAJ: 0.3000 mg | Freq: Once | INTRAMUSCULAR | Status: DC

## 2015-12-16 MED ORDER — METOPROLOL TARTRATE 25 MG PO TABS: 25.0000 mg | ORAL_TABLET | Freq: Two times a day (BID) | ORAL | 6 refills | Status: DC

## 2015-12-16 NOTE — Progress Notes (Signed)
  Patient: Kathy [name] Female    DOB: 02/23/1966   50 y.o.   MRN: [id number] Visit Date: 12/16/2015  Today's Provider: [place]   Chief Complaint  Patient presents with  . Follow-up   Subjective:    HPI Patient with a history of MI, CHF, HTN, CAD, LBBB, seizures, HLD and asthma.    She does not use any inhalers.  She had an adverse reaction to the albuterol inhalers.  She states the other inhaler was not approved.  She does remember which inhaler she was prescribed.    Only drinking water.    Only 5 cigarettes daily.  Last eye exam was one year ago.  Gave $20 for co-pay to see dentist.  She never received the paper work.  Seen in the ED recently in July.  Diagnosed with new LBBB.  Told to take pulse prior to taking metoprolol and if her pulse is less than 70 bpm, she was instructed not to take the metoprolol.    She has not seen a cardiologist recently.       Allergies  Allergen Reactions  . Aspirin Anaphylaxis  . Celebrex [Celecoxib] Anaphylaxis and Hives  . Percocet [Oxycodone-Acetaminophen] Shortness Of Breath  . Simvastatin-High Dose Hives and Shortness Of Breath    Can tolerate 20 mg dose.   Jonah Blue [Metaxalone] Anaphylaxis  . Morphine Swelling  . Vicodin [Hydrocodone-Acetaminophen] Hives and Other (See Comments)    Feels like her tongue swells  . Albuterol Palpitations   Previous Medications   No medications on file    Review of Systems  Social History  Substance Use Topics  . Smoking status: Current Every Day Smoker    Packs/day: 0.50    Types: Cigarettes  . Smokeless tobacco: Never Used  . Alcohol use No   Objective:   BP 121/73   Pulse 79   Temp 98.6 F (37 C)   Resp 16   Ht 5\' 7"  (1.702 m)   Wt 250 lb (113.4 kg)   SpO2 99%   BMI 39.16 kg/m   Physical Exam      Assessment & Plan:    1. Newly diagnosed with LBBB  - needs to see cardiology     2. HTN  - controlled   3. Tobacco abuse  - encouraged to quit  4.  Asthma  -sent Advair script to [facility]        [facility]

## 2015-12-29 ENCOUNTER — Ambulatory Visit: Payer: Self-pay | Admitting: Ophthalmology

## 2015-12-30 ENCOUNTER — Telehealth: Payer: Self-pay

## 2015-12-30 NOTE — Telephone Encounter (Signed)
Called pt to reschedule eye appt. No answer.

## 2016-02-07 ENCOUNTER — Encounter (INDEPENDENT_AMBULATORY_CARE_PROVIDER_SITE_OTHER): Payer: Self-pay

## 2016-02-07 ENCOUNTER — Ambulatory Visit: Payer: Self-pay | Admitting: Pharmacy Technician

## 2016-02-07 DIAGNOSIS — Z79899 Other long term (current) drug therapy: Secondary | ICD-10-CM

## 2016-02-09 NOTE — Progress Notes (Signed)
Patient to have [facility] appointment performed by a Pharmacist at [facility].  Appointment will be made by receptionist during [place].

## 2016-02-09 NOTE — Progress Notes (Signed)
Met with patient completed financial assistance application for [facility].  Patient agreed to be responsible for gathering financial information and forwarding to appropriate department in [facility]    Completed [facility] application and contract.  Patient agreed to all terms of the [facility] contract.  Patient to provide notarized letter of support.  Provided patient with community resource material based on her particular needs.    Patient currently getting medications filled at [facility].  Will transfer prescriptions from [facility] once proof of income is provided.  [facility]

## 2016-02-24 ENCOUNTER — Observation Stay
Admission: EM | Admit: 2016-02-24 | Discharge: 2016-02-24 | Disposition: A | Discharge status: Home / Self Care | Payer: Self-pay | Attending: Family Medicine | Admitting: Family Medicine

## 2016-02-24 ENCOUNTER — Emergency Department: Payer: Self-pay

## 2016-02-24 ENCOUNTER — Encounter: Payer: Self-pay | Admitting: Emergency Medicine

## 2016-02-24 DIAGNOSIS — Z7902 Long term (current) use of antithrombotics/antiplatelets: Secondary | ICD-10-CM | POA: Insufficient documentation

## 2016-02-24 DIAGNOSIS — R079 Chest pain, unspecified: Secondary | ICD-10-CM | POA: Diagnosis present

## 2016-02-24 DIAGNOSIS — I10 Essential (primary) hypertension: Secondary | ICD-10-CM

## 2016-02-24 DIAGNOSIS — I447 Left bundle-branch block, unspecified: Secondary | ICD-10-CM | POA: Insufficient documentation

## 2016-02-24 DIAGNOSIS — I509 Heart failure, unspecified: Secondary | ICD-10-CM | POA: Insufficient documentation

## 2016-02-24 DIAGNOSIS — R0789 Other chest pain: Principal | ICD-10-CM | POA: Insufficient documentation

## 2016-02-24 DIAGNOSIS — I110 Hypertensive heart disease with heart failure: Secondary | ICD-10-CM | POA: Insufficient documentation

## 2016-02-24 DIAGNOSIS — J45909 Unspecified asthma, uncomplicated: Secondary | ICD-10-CM | POA: Insufficient documentation

## 2016-02-24 DIAGNOSIS — Z7951 Long term (current) use of inhaled steroids: Secondary | ICD-10-CM | POA: Insufficient documentation

## 2016-02-24 DIAGNOSIS — I252 Old myocardial infarction: Secondary | ICD-10-CM | POA: Insufficient documentation

## 2016-02-24 DIAGNOSIS — Z79899 Other long term (current) drug therapy: Secondary | ICD-10-CM | POA: Insufficient documentation

## 2016-02-24 DIAGNOSIS — I2510 Atherosclerotic heart disease of native coronary artery without angina pectoris: Secondary | ICD-10-CM | POA: Insufficient documentation

## 2016-02-24 DIAGNOSIS — E785 Hyperlipidemia, unspecified: Secondary | ICD-10-CM | POA: Insufficient documentation

## 2016-02-24 DIAGNOSIS — F17210 Nicotine dependence, cigarettes, uncomplicated: Secondary | ICD-10-CM | POA: Insufficient documentation

## 2016-02-24 LAB — CBC
HCT: 43.3 % (ref 35.0–47.0)
Hemoglobin: 14.9 g/dL (ref 12.0–16.0)
MCH: 29.5 pg (ref 26.0–34.0)
MCHC: 34.4 g/dL (ref 32.0–36.0)
MCV: 85.8 fL (ref 80.0–100.0)
Platelets: 230 10*3/uL (ref 150–440)
RBC: 5.05 MIL/uL (ref 3.80–5.20)
RDW: 14.6 % — AB (ref 11.5–14.5)
WBC: 11.4 10*3/uL — ABNORMAL HIGH (ref 3.6–11.0)

## 2016-02-24 LAB — BASIC METABOLIC PANEL
Anion gap: 7 (ref 5–15)
BUN: 16 mg/dL (ref 6–20)
CALCIUM: 8.9 mg/dL (ref 8.9–10.3)
CO2: 27 mmol/L (ref 22–32)
Chloride: 103 mmol/L (ref 101–111)
Creatinine, Ser: 0.76 mg/dL (ref 0.44–1.00)
GFR calc Af Amer: >60 mL/min (ref >60)
GLUCOSE: 111 mg/dL — AB (ref 65–99)
Potassium: 4 mmol/L (ref 3.5–5.1)
Sodium: 137 mmol/L (ref 135–145)

## 2016-02-24 LAB — LIPID PANEL
Cholesterol: 130 mg/dL (ref 0–200)
HDL: 23 mg/dL — ABNORMAL LOW (ref >40)
LDL Cholesterol: 77 mg/dL (ref 0–99)
Total CHOL/HDL Ratio: 5.7 RATIO
Triglycerides: 149 mg/dL (ref <150)
VLDL: 30 mg/dL (ref 0–40)

## 2016-02-24 LAB — TSH: TSH: 3.242 u[IU]/mL (ref 0.350–4.500)

## 2016-02-24 LAB — TROPONIN I

## 2016-02-24 MED ORDER — DOCUSATE SODIUM 100 MG PO CAPS: 100.0000 mg | ORAL_CAPSULE | Freq: Two times a day (BID) | ORAL | Status: DC

## 2016-02-24 MED ORDER — ACETAMINOPHEN 650 MG RE SUPP: 650.0000 mg | Freq: Four times a day (QID) | RECTAL | Status: DC | PRN

## 2016-02-24 MED ORDER — DIPHENHYDRAMINE HCL 50 MG/ML IJ SOLN
25.0000 mg | Freq: Once | INTRAMUSCULAR | Status: AC
  Administered 2016-02-24: 25 mg via INTRAVENOUS
  Filled 2016-02-24: qty 1

## 2016-02-24 MED ORDER — EPINEPHRINE 0.3 MG/0.3ML IJ SOAJ: 0.3000 mg | Freq: Once | INTRAMUSCULAR | Status: DC | PRN

## 2016-02-24 MED ORDER — SIMVASTATIN 20 MG PO TABS: 20.0000 mg | ORAL_TABLET | Freq: Every day | ORAL | Status: DC

## 2016-02-24 MED ORDER — MECLIZINE HCL 25 MG PO TABS
25.0000 mg | ORAL_TABLET | Freq: Three times a day (TID) | ORAL | Status: DC | PRN
Start: 2016-02-24 — End: 2016-02-24

## 2016-02-24 MED ORDER — ONDANSETRON HCL 4 MG/2ML IJ SOLN: 4.0000 mg | Freq: Four times a day (QID) | INTRAMUSCULAR | Status: DC | PRN

## 2016-02-24 MED ORDER — ACETAMINOPHEN 325 MG PO TABS: 650.0000 mg | ORAL_TABLET | Freq: Four times a day (QID) | ORAL | Status: DC | PRN

## 2016-02-24 MED ORDER — MOMETASONE FURO-FORMOTEROL FUM 100-5 MCG/ACT IN AERO
2.0000 | INHALATION_SPRAY | Freq: Two times a day (BID) | RESPIRATORY_TRACT | Status: DC
  Filled 2016-02-24: qty 8.8

## 2016-02-24 MED ORDER — FUROSEMIDE 20 MG PO TABS: 20.0000 mg | ORAL_TABLET | Freq: Every day | ORAL | Status: DC

## 2016-02-24 MED ORDER — NICOTINE 21 MG/24HR TD PT24: 21.0000 mg | MEDICATED_PATCH | Freq: Every day | TRANSDERMAL | Status: DC

## 2016-02-24 MED ORDER — SODIUM CHLORIDE 0.9% FLUSH: 3.0000 mL | Freq: Two times a day (BID) | INTRAVENOUS | Status: DC

## 2016-02-24 MED ORDER — ENOXAPARIN SODIUM 40 MG/0.4ML ~~LOC~~ SOLN: 40.0000 mg | SUBCUTANEOUS | Status: DC

## 2016-02-24 MED ORDER — AMLODIPINE BESYLATE 5 MG PO TABS: 5.0000 mg | ORAL_TABLET | Freq: Every day | ORAL | Status: DC

## 2016-02-24 MED ORDER — NITROGLYCERIN 0.4 MG SL SUBL
0.4000 mg | SUBLINGUAL_TABLET | SUBLINGUAL | Status: DC | PRN
Start: 2016-02-24 — End: 2016-02-24

## 2016-02-24 MED ORDER — ONDANSETRON HCL 4 MG PO TABS: 4.0000 mg | ORAL_TABLET | Freq: Four times a day (QID) | ORAL | Status: DC | PRN

## 2016-02-24 MED ORDER — METOPROLOL TARTRATE 25 MG PO TABS: 25.0000 mg | ORAL_TABLET | Freq: Two times a day (BID) | ORAL | Status: DC

## 2016-02-24 MED ORDER — ISOSORBIDE MONONITRATE ER 30 MG PO TB24: 30.0000 mg | ORAL_TABLET | Freq: Every day | ORAL | Status: DC

## 2016-02-24 MED ORDER — CLOPIDOGREL BISULFATE 75 MG PO TABS: 75.0000 mg | ORAL_TABLET | Freq: Every day | ORAL | Status: DC

## 2016-02-24 NOTE — Progress Notes (Signed)
Kathy [name] is a 50 y.o. female  [id number]  Primary Cardiologist: Neoma Laming Reason for Consultation: Chest pain  HPI: This is a 50 year old white female with a past medical history of coronary artery disease presented to the hospital with chest pain which has resolved now. Patient states that it was more like chest pain like she had when when she had a heart attack in the past but has ruled out for myocardial infarction.   Review of Systems: No orthopnea PND or leg swelling   Past Medical History:  Diagnosis Date  . Asthma   . CHF (congestive heart failure) ([place])   . Hyperlipidemia   . Hypertension   . Myocardial infarction   . Obesity   . Seizures ([place])     Facility-Administered Medications Prior to Admission  Medication Dose Route Frequency Provider Last Rate Last Dose  . EPINEPHrine (EPI-PEN) injection 0.3 mg  0.3 mg Intramuscular Once Shannon A McGowan, PA-C       Medications Prior to Admission  Medication Sig Dispense Refill  . amLODipine (NORVASC) 5 MG tablet Take 1 tablet (5 mg total) by mouth daily. 30 tablet 6  . clopidogrel (PLAVIX) 75 MG tablet Take 1 tablet (75 mg total) by mouth daily. 30 tablet 6  . Fluticasone-Salmeterol (ADVAIR) 100-50 MCG/DOSE AEPB Inhale 1 puff into the lungs 2 (two) times daily. 60 each 6  . furosemide (LASIX) 20 MG tablet Take 1 tablet (20 mg total) by mouth daily. 30 tablet 6  . isosorbide mononitrate (IMDUR) 30 MG 24 hr tablet Take 1 tablet (30 mg total) by mouth daily. 30 tablet 6  . meclizine (ANTIVERT) 25 MG tablet Take 1 tablet (25 mg total) by mouth 3 (three) times daily as needed for dizziness. 90 tablet 6  . metoprolol tartrate (LOPRESSOR) 25 MG tablet Take 1 tablet (25 mg total) by mouth 2 (two) times daily. 60 tablet 6  . nitroGLYCERIN (NITROSTAT) 0.4 MG SL tablet Place 1 tablet (0.4 mg total) under the tongue every 5 (five) minutes as needed for chest pain. 30 tablet 6  . simvastatin (ZOCOR) 20 MG tablet Take 1 tablet (20  mg total) by mouth daily. 30 tablet 6     . amLODipine  5 mg Oral Daily  . clopidogrel  75 mg Oral Daily  . docusate sodium  100 mg Oral BID  . enoxaparin (LOVENOX) injection  40 mg Subcutaneous Q24H  . furosemide  20 mg Oral Daily  . isosorbide mononitrate  30 mg Oral Daily  . metoprolol tartrate  25 mg Oral BID  . mometasone-formoterol  2 puff Inhalation BID  . nicotine  21 mg Transdermal Daily  . simvastatin  20 mg Oral Daily  . sodium chloride flush  3 mL Intravenous Q12H    Infusions:   Allergies  Allergen Reactions  . Aspirin Anaphylaxis  . Celebrex [Celecoxib] Anaphylaxis and Hives  . Percocet [Oxycodone-Acetaminophen] Shortness Of Breath  . Simvastatin-High Dose Hives and Shortness Of Breath    Can tolerate 20 mg dose.   Jonah Blue [Metaxalone] Anaphylaxis  . Morphine Swelling  . Vicodin [Hydrocodone-Acetaminophen] Hives and Other (See Comments)    Feels like her tongue swells  . Albuterol Palpitations    Social History   Social History  . Marital status: Divorced    Spouse name: N/A  . Number of children: N/A  . Years of education: N/A   Occupational History  . Not on file.   Social History Main Topics  .  Smoking status: Current Every Day Smoker    Packs/day: 0.50    Types: Cigarettes  . Smokeless tobacco: Never Used  . Alcohol use No  . Drug use: Unknown  . Sexual activity: Not on file   Other Topics Concern  . Not on file   Social History Narrative  . No narrative on file    Family History  Problem Relation Age of Onset  . Kidney disease Sister   . Diabetes Sister   . COPD Sister   . Stroke Sister   . Diabetes Mother   . Stroke Mother   . Heart attack Mother   . Stroke Father   . Asthma Daughter   . Asthma Son     PHYSICAL EXAM: Vitals:   02/24/16 0430 02/24/16 0656  BP: 117/71 129/72  Pulse: 63 61  Resp: 19 18  Temp:  97.8 F (36.6 C)     Intake/Output Summary (Last 24 hours) at 02/24/16 0816 Last data filed at  02/24/16 0758  Gross per 24 hour  Intake                0 ml  Output                0 ml  Net                0 ml    General:  Well appearing. No respiratory difficulty HEENT: normal Neck: supple. no JVD. Carotids 2+ bilat; no bruits. No lymphadenopathy or thryomegaly appreciated. Cor: PMI nondisplaced. Regular rate & rhythm. No rubs, gallops or murmurs. Lungs: clear Abdomen: soft, nontender, nondistended. No hepatosplenomegaly. No bruits or masses. Good bowel sounds. Extremities: no cyanosis, clubbing, rash, edema Neuro: alert & oriented x 3, cranial nerves grossly intact. moves all 4 extremities w/o difficulty. Affect pleasant.  [id number] sinus rhythm left bundle branch block  Results for orders placed or performed during the hospital encounter of 02/24/16 (from the past 24 hour(s))  Basic metabolic panel     Status: Abnormal   Collection Time: 02/24/16  2:40 AM  Result Value Ref Range   Sodium 137 135 - 145 mmol/L   Potassium 4.0 3.5 - 5.1 mmol/L   Chloride 103 101 - 111 mmol/L   CO2 27 22 - 32 mmol/L   Glucose, Bld 111 (H) 65 - 99 mg/dL   BUN 16 6 - 20 mg/dL   Creatinine, Ser 0.76 0.44 - 1.00 mg/dL   Calcium 8.9 8.9 - 10.3 mg/dL   GFR calc non Af Amer >60 >60 mL/min   GFR calc Af Amer >60 >60 mL/min   Anion gap 7 5 - 15  CBC     Status: Abnormal   Collection Time: 02/24/16  2:40 AM  Result Value Ref Range   WBC 11.4 (H) 3.6 - 11.0 K/uL   RBC 5.05 3.80 - 5.20 MIL/uL   Hemoglobin 14.9 12.0 - 16.0 g/dL   HCT 43.3 35.0 - 47.0 %   MCV 85.8 80.0 - 100.0 fL   MCH 29.5 26.0 - 34.0 pg   MCHC 34.4 32.0 - 36.0 g/dL   RDW 14.6 (H) 11.5 - 14.5 %   Platelets 230 150 - 440 K/uL  Troponin I     Status: None   Collection Time: 02/24/16  2:40 AM  Result Value Ref Range   Troponin I <0.03 <0.03 ng/mL  Troponin I     Status: None   Collection Time: 02/24/16  6:18 AM  Result Value Ref  Range   Troponin I <0.03 <0.03 ng/mL  TSH     Status: None   Collection Time: 02/24/16  6:18 AM   Result Value Ref Range   TSH 3.242 0.350 - 4.500 uIU/mL  Lipid panel     Status: Abnormal   Collection Time: 02/24/16  6:18 AM  Result Value Ref Range   Cholesterol 130 0 - 200 mg/dL   Triglycerides 149 <150 mg/dL   HDL 23 (L) >40 mg/dL   Total CHOL/HDL Ratio 5.7 RATIO   VLDL 30 0 - 40 mg/dL   LDL Cholesterol 77 0 - 99 mg/dL   Dg Chest 2 View  Result Date: 02/24/2016 CLINICAL DATA:  Chest pain, radiating into the mandible. EXAM: CHEST  2 VIEW COMPARISON:  11/05/2015 FINDINGS: The lungs are clear. The pulmonary vasculature is normal. Heart size is normal. Hilar and mediastinal contours are unremarkable. There is no pleural effusion. IMPRESSION: No active cardiopulmonary disease. Electronically Signed   By: Andreas Newport M.D.   On: 02/24/2016 03:38     ASSESSMENT AND PLAN:Atypical chest pain with MI being ruled out and history of left bundle branch block which is not new may go home on isosorbide 30 mg once a day with stress test being scheduled tomorrow in my office at [facility]

## 2016-02-24 NOTE — Progress Notes (Signed)
Pt admitted from ER via stretcher @ [phone]. No complaints of pain at this time. Skin intact. Telemetry monitor applied and notified CCMD.

## 2016-02-24 NOTE — ED Notes (Signed)
Pt reports itching on her chest. Pt's chest is red.

## 2016-02-24 NOTE — ED Notes (Signed)
Pt reports burning and itching has subsided.

## 2016-02-24 NOTE — ED Notes (Signed)
Patient transported to X-ray 

## 2016-02-24 NOTE — ED Notes (Signed)
Pt ambulated to bathroom without difficulty.

## 2016-02-24 NOTE — H&P (Signed)
Kathy [name] is an 50 y.o. female.   Chief Complaint: Chest pain HPI: The patient with past medical history of coronary artery disease status post MI and PCI without stent placement presents emergency department complaining of chest pain. The pain began as the patient was sitting on her bed prior to lying down. She states that she has had multiple episodes of chest pain since her MI in 2012. The pain occurs sometimes at rest and sometimes with exertion. She felt numbness in both of her arms this time as well as brief nausea. She received nitroglycerin spray en route but did not have relief of her chest pain until she received Benadryl in the emergency department. Cardiac rhythm on telemetry was noted to have occasional inversion of QRS complex. The patient carries a diagnosis of left bundle branch block which was new as of her last chest pain admission 3 months ago. Initial troponin was negative but due to cardiac risk factors emergency department staff called the hospitalist service for admission.  Past Medical History:  Diagnosis Date  . Asthma   . CHF (congestive heart failure) ([place])   . Hyperlipidemia   . Hypertension   . Myocardial infarction   . Obesity   . Seizures ([place])     Past Surgical History:  Procedure Laterality Date  . ANTERIOR CRUCIATE LIGAMENT REPAIR    . BACK SURGERY     L4 and L5  . CARDIAC CATHETERIZATION    . TONSILLECTOMY AND ADENOIDECTOMY  2008  . TUBAL LIGATION      Family History  Problem Relation Age of Onset  . Kidney disease Sister   . Diabetes Sister   . COPD Sister   . Stroke Sister   . Diabetes Mother   . Stroke Mother   . Heart attack Mother   . Stroke Father   . Asthma Daughter   . Asthma Son    Social History:  reports that she has been smoking Cigarettes.  She has been smoking about 0.50 packs per day. She has never used smokeless tobacco. She reports that she does not drink alcohol. Her drug history is not on file.  Allergies:  Allergies   Allergen Reactions  . Aspirin Anaphylaxis  . Celebrex [Celecoxib] Anaphylaxis and Hives  . Percocet [Oxycodone-Acetaminophen] Shortness Of Breath  . Simvastatin-High Dose Hives and Shortness Of Breath    Can tolerate 20 mg dose.   Jonah Blue [Metaxalone] Anaphylaxis  . Morphine Swelling  . Vicodin [Hydrocodone-Acetaminophen] Hives and Other (See Comments)    Feels like her tongue swells  . Albuterol Palpitations    Facility-Administered Medications Prior to Admission  Medication Dose Route Frequency Provider Last Rate Last Dose  . EPINEPHrine (EPI-PEN) injection 0.3 mg  0.3 mg Intramuscular Once Shannon A McGowan, PA-C       Medications Prior to Admission  Medication Sig Dispense Refill  . amLODipine (NORVASC) 5 MG tablet Take 1 tablet (5 mg total) by mouth daily. 30 tablet 6  . clopidogrel (PLAVIX) 75 MG tablet Take 1 tablet (75 mg total) by mouth daily. 30 tablet 6  . Fluticasone-Salmeterol (ADVAIR) 100-50 MCG/DOSE AEPB Inhale 1 puff into the lungs 2 (two) times daily. 60 each 6  . furosemide (LASIX) 20 MG tablet Take 1 tablet (20 mg total) by mouth daily. 30 tablet 6  . isosorbide mononitrate (IMDUR) 30 MG 24 hr tablet Take 1 tablet (30 mg total) by mouth daily. 30 tablet 6  . meclizine (ANTIVERT) 25 MG tablet Take 1 tablet (  25 mg total) by mouth 3 (three) times daily as needed for dizziness. 90 tablet 6  . metoprolol tartrate (LOPRESSOR) 25 MG tablet Take 1 tablet (25 mg total) by mouth 2 (two) times daily. 60 tablet 6  . nitroGLYCERIN (NITROSTAT) 0.4 MG SL tablet Place 1 tablet (0.4 mg total) under the tongue every 5 (five) minutes as needed for chest pain. 30 tablet 6  . simvastatin (ZOCOR) 20 MG tablet Take 1 tablet (20 mg total) by mouth daily. 30 tablet 6    Results for orders placed or performed during the hospital encounter of 02/24/16 (from the past 48 hour(s))  Basic metabolic panel     Status: Abnormal   Collection Time: 02/24/16  2:40 AM  Result Value Ref Range    Sodium 137 135 - 145 mmol/L   Potassium 4.0 3.5 - 5.1 mmol/L   Chloride 103 101 - 111 mmol/L   CO2 27 22 - 32 mmol/L   Glucose, Bld 111 (H) 65 - 99 mg/dL   BUN 16 6 - 20 mg/dL   Creatinine, Ser 0.76 0.44 - 1.00 mg/dL   Calcium 8.9 8.9 - 10.3 mg/dL   GFR calc non Af Amer >60 >60 mL/min   GFR calc Af Amer >60 >60 mL/min    Comment: (NOTE) The eGFR has been calculated using the CKD EPI equation. This calculation has not been validated in all clinical situations. eGFR's persistently <60 mL/min signify possible Chronic Kidney Disease.    Anion gap 7 5 - 15  CBC     Status: Abnormal   Collection Time: 02/24/16  2:40 AM  Result Value Ref Range   WBC 11.4 (H) 3.6 - 11.0 K/uL   RBC 5.05 3.80 - 5.20 MIL/uL   Hemoglobin 14.9 12.0 - 16.0 g/dL   HCT 43.3 35.0 - 47.0 %   MCV 85.8 80.0 - 100.0 fL   MCH 29.5 26.0 - 34.0 pg   MCHC 34.4 32.0 - 36.0 g/dL   RDW 14.6 (H) 11.5 - 14.5 %   Platelets 230 150 - 440 K/uL  Troponin I     Status: None   Collection Time: 02/24/16  2:40 AM  Result Value Ref Range   Troponin I <0.03 <0.03 ng/mL   Dg Chest 2 View  Result Date: 02/24/2016 CLINICAL DATA:  Chest pain, radiating into the mandible. EXAM: CHEST  2 VIEW COMPARISON:  11/05/2015 FINDINGS: The lungs are clear. The pulmonary vasculature is normal. Heart size is normal. Hilar and mediastinal contours are unremarkable. There is no pleural effusion. IMPRESSION: No active cardiopulmonary disease. Electronically Signed   By: Andreas Newport M.D.   On: 02/24/2016 03:38    Review of Systems  Constitutional: Negative for chills, diaphoresis and fever.  HENT: Negative for sore throat and tinnitus.   Eyes: Negative for blurred vision and redness.  Respiratory: Negative for cough and shortness of breath.   Cardiovascular: Positive for chest pain. Negative for palpitations, orthopnea and PND.  Gastrointestinal: Positive for nausea. Negative for abdominal pain, diarrhea and vomiting.  Genitourinary:  Negative for dysuria, frequency and urgency.  Musculoskeletal: Negative for joint pain and myalgias.  Skin: Negative for rash.       No lesions  Neurological: Positive for sensory change. Negative for speech change, focal weakness and weakness.  Endo/Heme/Allergies: Does not bruise/bleed easily.       No temperature intolerance  Psychiatric/Behavioral: Negative for depression and suicidal ideas.    Blood pressure 129/72, pulse 61, temperature 97.8 F (36.6 C),  temperature source Oral, resp. rate 18, height _0  (1.702 m), weight 113.4 kg (250 lb), SpO2 97 %. Physical Exam  Vitals reviewed. Constitutional: She is oriented to person, place, and time. She appears well-developed and well-nourished. No distress.  HENT:  Head: Normocephalic and atraumatic.  Mouth/Throat: Oropharynx is clear and moist.  Eyes: Conjunctivae and EOM are normal. Pupils are equal, round, and reactive to light. No scleral icterus.  Neck: Normal range of motion. Neck supple. No JVD present. No tracheal deviation present. No thyromegaly present.  Cardiovascular: Normal rate, regular rhythm and normal heart sounds.  Exam reveals no gallop and no friction rub.   No murmur heard. Respiratory: Effort normal and breath sounds normal.  GI: Soft. Bowel sounds are normal. She exhibits no distension. There is no tenderness.  Genitourinary:  Genitourinary Comments: Deferred  Lymphadenopathy:    She has no cervical adenopathy.  Neurological: She is alert and oriented to person, place, and time. No cranial nerve deficit. She exhibits normal muscle tone.  Skin: Skin is warm and dry. No rash noted. No erythema.  Psychiatric: She has a normal mood and affect. Her behavior is normal. Judgment and thought content normal.     Assessment/Plan This is a 50 year old female admitted for chest pain. 1. 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 She has not received aspirin because she is allergic. Continue  to cycle cardiac enzymes and monitor telemetry. The patient continues to smoke. Check lipid panel. I have made the patient nothing by mouth except for meds and sips in anticipation of possible cardiac catheterization. 2. Coronary artery disease: Continue Plavix 3. Hypertension: Initially uncontrolled but has now normalized. Continue metoprolol and amlodipine 4. CHF: Stable; continue Lasix per home regimen 5. Hyperlipidemia: Continue statin therapy 6. GI prophylaxis: None 7. DVT prophylaxis: Lovenox The patient is a full code. Time spent on admission orders and patient care possibly 45 minutes  Harrie Foreman, MD 02/24/2016, 7:07 AM

## 2016-02-24 NOTE — ED Notes (Signed)
Admitting MD at bedside.

## 2016-02-24 NOTE — ED Notes (Signed)
Pt asleep. Fiance at bedside. Call bell in reach.

## 2016-02-24 NOTE — ED Triage Notes (Signed)
Pt arrived via ems. Pt complains of chest pain that starts in the left chest and radiates to the jaw. Pt given 2 NTG en route.

## 2016-02-24 NOTE — Discharge Summary (Signed)
[place] [facility] [place] NAME: Kathy [name]    MR#:  [id number]  DATE OF BIRTH:  September 11, 1965  DATE OF ADMISSION:  02/24/2016   ADMITTING PHYSICIAN: Harrie Foreman, MD  DATE OF DISCHARGE: 02/24/16  PRIMARY CARE PHYSICIAN: Pcp Not In System   ADMISSION DIAGNOSIS:  Essential hypertension [I10] Chest pain, unspecified type [R07.9] DISCHARGE DIAGNOSIS:  Active Problems:   Chest pain  SECONDARY DIAGNOSIS:   Past Medical History:  Diagnosis Date  . Asthma   . CHF (congestive heart failure) ([place])   . Hyperlipidemia   . Hypertension   . Myocardial infarction   . Obesity   . Seizures [facility])    HOSPITAL COURSE:  This is a 50 year old female with a past medical history significant for coronary artery disease status post MI and PCI without stent placement who was admitted through the emergency department overnight for diagnosis of chest pain/unstable angina rule out ACS. Her cardiac enzymes were negative 2. She was seen in consultation by cardiology who recommended discharge and urgent outpatient stress testing. Left bundle branch block was identified as being old. On the day of discharge she is seen and examined at bedside, reports that she is pain-free, tolerating by mouth.  DISCHARGE CONDITIONS:  Atypical chest pain History of coronary artery disease status post MI Hypertension CHF Hyperlipidemia  CONSULTS OBTAINED:  Treatment Team:  Dionisio David, MD DRUG ALLERGIES:   Allergies  Allergen Reactions  . Aspirin Anaphylaxis  . Celebrex [Celecoxib] Anaphylaxis and Hives  . Percocet [Oxycodone-Acetaminophen] Shortness Of Breath  . Simvastatin-High Dose Hives and Shortness Of Breath    Can tolerate 20 mg dose.   Jonah Blue [Metaxalone] Anaphylaxis  . Morphine Swelling  . Vicodin [Hydrocodone-Acetaminophen] Hives and Other (See Comments)    Feels like her tongue swells  . Albuterol Palpitations   DISCHARGE MEDICATIONS:       Medication List    TAKE these medications   amLODipine 5 MG tablet Commonly known as:  NORVASC Take 1 tablet (5 mg total) by mouth daily.   clopidogrel 75 MG tablet Commonly known as:  PLAVIX Take 1 tablet (75 mg total) by mouth daily.   Fluticasone-Salmeterol 100-50 MCG/DOSE Aepb Commonly known as:  ADVAIR Inhale 1 puff into the lungs 2 (two) times daily.   furosemide 20 MG tablet Commonly known as:  LASIX Take 1 tablet (20 mg total) by mouth daily.   isosorbide mononitrate 30 MG 24 hr tablet Commonly known as:  IMDUR Take 1 tablet (30 mg total) by mouth daily.   meclizine 25 MG tablet Commonly known as:  ANTIVERT Take 1 tablet (25 mg total) by mouth 3 (three) times daily as needed for dizziness.   metoprolol tartrate 25 MG tablet Commonly known as:  LOPRESSOR Take 1 tablet (25 mg total) by mouth 2 (two) times daily.   nitroGLYCERIN 0.4 MG SL tablet Commonly known as:  NITROSTAT Place 1 tablet (0.4 mg total) under the tongue every 5 (five) minutes as needed for chest pain.   simvastatin 20 MG tablet Commonly known as:  ZOCOR Take 1 tablet (20 mg total) by mouth daily.        DISCHARGE INSTRUCTIONS:  Follow-up with cardiology today for stress testing Follow-up primary care provider within one week DIET:  Cardiac diet DISCHARGE CONDITION:  Good ACTIVITY:  Activity as tolerated OXYGEN:  Home Oxygen: No.  Oxygen Delivery: room air DISCHARGE LOCATION:  home   If you experience worsening  of your admission symptoms, develop shortness of breath, life threatening emergency, suicidal or homicidal thoughts you must seek medical attention immediately by calling 911 or calling your MD immediately  if symptoms less severe.  You Must read complete instructions/literature along with all the possible adverse reactions/side effects for all the Medicines you take and that have been prescribed to you. Take any new Medicines after you have completely understood and accpet  all the possible adverse reactions/side effects.   Please note  You were cared for by a hospitalist during your hospital stay. If you have any questions about your discharge medications or the care you received while you were in the hospital after you are discharged, you can call the unit and asked to speak with the hospitalist on call if the hospitalist that took care of you is not available. Once you are discharged, your primary care physician will handle any further medical issues. Please note that NO REFILLS for any discharge medications will be authorized once you are discharged, as it is imperative that you return to your primary care physician (or establish a relationship with a primary care physician if you do not have one) for your aftercare needs so that they can reassess your need for medications and monitor your lab values.    On the day of Discharge:  VITAL SIGNS:  Blood pressure (!) 143/74, pulse 65, temperature 97.8 F (36.6 C), temperature source Oral, resp. rate 18, height 5\' 7"  (1.702 m), weight 113.4 kg (250 lb), SpO2 97 %. PHYSICAL EXAMINATION:  GENERAL:  50 y.o.-year-old patient lying in the bed with no acute distress.  EYES: Pupils equal, round, reactive to light and accommodation. No scleral icterus. Extraocular muscles intact.  HEENT: Head atraumatic, normocephalic. Oropharynx and nasopharynx clear.  NECK:  Supple, no jugular venous distention. No thyroid enlargement, no tenderness.  LUNGS: Normal breath sounds bilaterally, no wheezing, rales,rhonchi or crepitation. No use of accessory muscles of respiration.  CARDIOVASCULAR: S1, S2 normal. No murmurs, rubs, or gallops.  ABDOMEN: Soft, non-tender, non-distended. Bowel sounds present. No organomegaly or mass.  EXTREMITIES: No pedal edema, cyanosis, or clubbing.  NEUROLOGIC: Cranial nerves II through XII are intact. Muscle strength 5/5 in all extremities. Sensation intact. Gait not checked.  PSYCHIATRIC: The patient is  alert and oriented x 3.  SKIN: No obvious rash, lesion, or ulcer.  DATA REVIEW:   CBC  Recent Labs Lab 02/24/16 0240  WBC 11.4*  HGB 14.9  HCT 43.3  PLT 230    Chemistries   Recent Labs Lab 02/24/16 0240  NA 137  K 4.0  CL 103  CO2 27  GLUCOSE 111*  BUN 16  CREATININE 0.76  CALCIUM 8.9     Microbiology Results  No results found for this or any previous visit.  RADIOLOGY:  Dg Chest 2 View  Result Date: 02/24/2016 CLINICAL DATA:  Chest pain, radiating into the mandible. EXAM: CHEST  2 VIEW COMPARISON:  11/05/2015 FINDINGS: The lungs are clear. The pulmonary vasculature is normal. Heart size is normal. Hilar and mediastinal contours are unremarkable. There is no pleural effusion. IMPRESSION: No active cardiopulmonary disease. Electronically Signed   By: Andreas Newport M.D.   On: 02/24/2016 03:38     Management plans discussed with the patient, family and they are in agreement.  CODE STATUS:     Code Status Orders        Start     Ordered   02/24/16 0658  Full code  Continuous  02/24/16 [id number]    Code Status History    Date Active Date Inactive Code Status Order ID Comments User Context   11/05/2015  2:24 PM 11/06/2015  9:53 PM Full Code [id number]  Nicholes Mango, MD Inpatient      TOTAL TIME TAKING CARE OF THIS PATIENT: 30 minutes.    Harvie Bridge M.D on 02/24/2016 at 9:43 AM  Between 7am to 6pm - Pager - [phone]  After 6pm go to [email or web address] - Proofreader  [facility]  Office  [phone]  CC: Primary care physician; Pcp Not In System   Note: This dictation was prepared with Dragon dictation along with smaller phrase technology. Any transcriptional errors that result from this process are unintentional.

## 2016-02-24 NOTE — ED Provider Notes (Signed)
[facility] Emergency Department Provider Note   First MD Initiated Contact with Patient 02/24/16 0235     (approximate)  I have reviewed the triage vital signs and the nursing notes.   HISTORY  Chief Complaint Chest Pain   HPI Kathy [name] is a 50 y.o. female history of CHF hyperlipidemia hypertension and previous myocardial infarction presents to the emergency department with acute onset of left-sided chest pain dating to left jaw with onset tonight. Patient states current pain score is 5 out of 10 status post 2 doses of nitroglycerin in route by EMS. Patient states that she was informed by her cardiologist in 2012 that she has "arterial spasms in no blockages that required a stent"   Past Medical History:  Diagnosis Date  . Asthma   . CHF (congestive heart failure) ([place])   . Hyperlipidemia   . Hypertension   . Myocardial infarction   . Obesity   . Seizures [facility])     Patient Active Problem List   Diagnosis Date Noted  . Chest pain 02/24/2016  . New left bundle branch block (LBBB) 11/05/2015  . Elevated lymphocytes 06/03/2015  . Dyslipidemia 06/03/2015  . Prediabetes 06/03/2015  . History of MI (myocardial infarction) 06/03/2015  . Tobacco abuse 06/03/2015  . Obesity 06/03/2015  . Seizures ([place]) 11/26/2014  . Hypertension 05/21/2014  . CAD (coronary artery disease) 05/21/2014  . Hyperlipidemia 05/21/2014    Past Surgical History:  Procedure Laterality Date  . ANTERIOR CRUCIATE LIGAMENT REPAIR    . BACK SURGERY     L4 and L5  . CARDIAC CATHETERIZATION    . TONSILLECTOMY AND ADENOIDECTOMY  2008  . TUBAL LIGATION      Prior to Admission medications   Medication Sig Start Date End Date Taking? Authorizing Provider  amLODipine (NORVASC) 5 MG tablet Take 1 tablet (5 mg total) by mouth daily. 12/16/15   Nori Riis, PA-C  clopidogrel (PLAVIX) 75 MG tablet Take 1 tablet (75 mg total) by mouth daily. 12/16/15   Larene Beach A McGowan, PA-C    Fluticasone-Salmeterol (ADVAIR) 100-50 MCG/DOSE AEPB Inhale 1 puff into the lungs 2 (two) times daily. 12/16/15   Larene Beach A McGowan, PA-C  furosemide (LASIX) 20 MG tablet Take 1 tablet (20 mg total) by mouth daily. 12/16/15   Larene Beach A McGowan, PA-C  isosorbide mononitrate (IMDUR) 30 MG 24 hr tablet Take 1 tablet (30 mg total) by mouth daily. 12/16/15   Nori Riis, PA-C  meclizine (ANTIVERT) 25 MG tablet Take 1 tablet (25 mg total) by mouth 3 (three) times daily as needed for dizziness. 12/16/15   Larene Beach A McGowan, PA-C  metoprolol tartrate (LOPRESSOR) 25 MG tablet Take 1 tablet (25 mg total) by mouth 2 (two) times daily. 12/16/15   Nori Riis, PA-C  nitroGLYCERIN (NITROSTAT) 0.4 MG SL tablet Place 1 tablet (0.4 mg total) under the tongue every 5 (five) minutes as needed for chest pain. 12/16/15   Larene Beach A McGowan, PA-C  simvastatin (ZOCOR) 20 MG tablet Take 1 tablet (20 mg total) by mouth daily. 12/16/15   Nori Riis, PA-C    Allergies Aspirin; Celebrex [celecoxib]; Percocet [oxycodone-acetaminophen]; Simvastatin-high dose; Skelaxin [metaxalone]; Morphine; Vicodin [hydrocodone-acetaminophen]; and Albuterol  Family History  Problem Relation Age of Onset  . Kidney disease Sister   . Diabetes Sister   . COPD Sister   . Stroke Sister   . Diabetes Mother   . Stroke Mother   . Heart attack Mother   .  Stroke Father   . Asthma Daughter   . Asthma Son     Social History Social History  Substance Use Topics  . Smoking status: Current Every Day Smoker    Packs/day: 0.50    Types: Cigarettes  . Smokeless tobacco: Never Used  . Alcohol use No    Review of Systems Constitutional: No fever/chills Eyes: No visual changes. ENT: No sore throat. Cardiovascular: Positive for chest pain. Respiratory: Denies shortness of breath. Gastrointestinal: No abdominal pain.  No nausea, no vomiting.  No diarrhea.  No constipation. Genitourinary: Negative for dysuria. Musculoskeletal: Negative  for back pain. Skin: Negative for rash. Neurological: Negative for headaches, focal weakness or numbness.  10-point ROS otherwise negative.  ____________________________________________   PHYSICAL EXAM:  VITAL SIGNS: ED Triage Vitals  Enc Vitals Group     BP 02/24/16 0245 (!) 160/80     Pulse Rate 02/24/16 0430 63     Resp 02/24/16 0245 (!) 21     Temp 02/24/16 0245 98 F (36.7 C)     Temp Source 02/24/16 0245 Oral     SpO2 02/24/16 0245 98 %     Weight 02/24/16 0249 250 lb (113.4 kg)     Height 02/24/16 0249 5\' 7"  (1.702 m)     Head Circumference --      Peak Flow --      Pain Score 02/24/16 0250 5     Pain Loc --      Pain Edu? --      Excl. in [place]? --     Constitutional: Alert and oriented. Well appearing and in no acute distress. Eyes: Conjunctivae are normal. PERRL. EOMI. Head: Atraumatic. Mouth/Throat: Mucous membranes are moist.  Oropharynx non-erythematous. Neck: No stridor.  No meningeal signs.  No cervical spine tenderness to palpation. Cardiovascular: Normal rate, regular rhythm. Good peripheral circulation. Grossly normal heart sounds. Respiratory: Normal respiratory effort.  No retractions. Lungs CTAB. Gastrointestinal: Soft and nontender. No distention.  Musculoskeletal: No lower extremity tenderness nor edema. No gross deformities of extremities. Neurologic:  Normal speech and language. No gross focal neurologic deficits are appreciated.  Skin:  Skin is warm, dry and intact. No rash noted. Psychiatric: Mood and affect are normal. Speech and behavior are normal.  ____________________________________________   LABS (all labs ordered are listed, but only abnormal results are displayed)  Labs Reviewed  BASIC METABOLIC PANEL - Abnormal; Notable for the following:       Result Value   Glucose, Bld 111 (*)    All other components within normal limits  CBC - Abnormal; Notable for the following:    WBC 11.4 (*)    RDW 14.6 (*)    All other components  within normal limits  TROPONIN I  TROPONIN I   ____________________________________________  EKG  ED ECG REPORT I, [place] Charlisha [place], the attending physician, personally viewed and interpreted this ECG.   Date: 02/24/2016  EKG Time: 2:44 AM  Rate: 73  Rhythm: Normal sinus rhythm with left bundle-branch block  Axis: Normal  Intervals: Normal  ST&T Change: None  ____________________________________________  RADIOLOGY I, [place] Gearldene Fiorenza, personally viewed and evaluated these images (plain radiographs) as part of my medical decision making, as well as reviewing the written report by the radiologist.  Dg Chest 2 View  Result Date: 02/24/2016 CLINICAL DATA:  Chest pain, radiating into the mandible. EXAM: CHEST  2 VIEW COMPARISON:  11/05/2015 FINDINGS: The lungs are clear. The pulmonary vasculature is normal. Heart size is normal.  Hilar and mediastinal contours are unremarkable. There is no pleural effusion. IMPRESSION: No active cardiopulmonary disease. Electronically Signed   By: Andreas Newport M.D.   On: 02/24/2016 03:38      Procedures     INITIAL IMPRESSION / ASSESSMENT AND PLAN / ED COURSE  Pertinent labs & imaging results that were available during my care of the patient were reviewed by me and considered in my medical decision making (see chart for details).  History of physical exam concerning for possible cardiac etiology for the patient's chest pain as such patient discussed with Dr. Marcille Blanco for hospital admission for further evaluation and management.   Clinical Course     ____________________________________________  FINAL CLINICAL IMPRESSION(S) / ED DIAGNOSES  Final diagnoses:  Chest pain, unspecified type     MEDICATIONS GIVEN DURING THIS VISIT:  Medications  diphenhydrAMINE (BENADRYL) injection 25 mg (25 mg Intravenous Given 02/24/16 0424)     NEW OUTPATIENT MEDICATIONS STARTED DURING THIS VISIT:  New Prescriptions   No medications on  file    Modified Medications   No medications on file    Discontinued Medications   No medications on file     Note:  This document was prepared using Dragon voice recognition software and may include unintentional dictation errors.    Gregor Hams, MD 02/24/16 [phone]

## 2016-03-23 ENCOUNTER — Ambulatory Visit: Payer: Self-pay | Admitting: Urology

## 2016-03-23 VITALS — BP 104/71 | HR 74 | Temp 98.2°F | Wt 239.0 lb

## 2016-03-23 DIAGNOSIS — E782 Mixed hyperlipidemia: Secondary | ICD-10-CM

## 2016-03-23 DIAGNOSIS — I10 Essential (primary) hypertension: Secondary | ICD-10-CM

## 2016-03-23 MED ORDER — ISOSORBIDE MONONITRATE ER 30 MG PO TB24: 60.0000 mg | ORAL_TABLET | Freq: Every day | ORAL | 3 refills | Status: DC

## 2016-03-23 MED ORDER — FUROSEMIDE 20 MG PO TABS: 20.0000 mg | ORAL_TABLET | Freq: Every day | ORAL | 3 refills | Status: DC

## 2016-03-23 MED ORDER — EPINEPHRINE 0.3 MG/0.3ML IJ SOAJ: 0.3000 mg | Freq: Once | INTRAMUSCULAR | 3 refills | Status: AC

## 2016-03-23 MED ORDER — AMLODIPINE BESYLATE 5 MG PO TABS: 5.0000 mg | ORAL_TABLET | Freq: Every day | ORAL | 3 refills | Status: DC

## 2016-03-23 MED ORDER — PANTOPRAZOLE SODIUM 40 MG PO TBEC: 40.0000 mg | DELAYED_RELEASE_TABLET | Freq: Every day | ORAL | 3 refills | Status: DC

## 2016-03-23 MED ORDER — CLOPIDOGREL BISULFATE 75 MG PO TABS: 75.0000 mg | ORAL_TABLET | Freq: Every day | ORAL | 3 refills | Status: DC

## 2016-03-23 MED ORDER — SIMVASTATIN 20 MG PO TABS: 20.0000 mg | ORAL_TABLET | Freq: Every day | ORAL | 3 refills | Status: DC

## 2016-03-23 NOTE — Progress Notes (Signed)
Patient: Kathy [name] Female    DOB: May 29, 1965   50 y.o.   MRN: [id number] Visit Date: 03/23/2016  Today's Provider: Hope   Chief Complaint  Patient presents with  . Hypertension    FU appt. went to the [facility] ,and they referred her to go to see a cardiologist; they found a spot on her liver and jaw.    Subjective:    Hypertension  Pertinent negatives include no chest pain, headaches or palpitations.   Patient with a history of MI, CHF, HTN, CAD, LBBB, seizures, HLD and asthma.    She does not use any inhalers.  She had an adverse reaction to the albuterol inhalers.  She states the other inhaler was not approved.  She does remember which inhaler she was prescribed.  She has not had an asthma attack in ten years.    Only drinking water.    Only 5 cigarettes daily.  Last eye exam was one year ago.  Was hospitalized for heart issues in 02/2016.  Followed up with Dr. Neoma Laming in his office.  She states she had carotid ultrasound, stress test and cardio echogram.  Was told that her murmur has changed to moderate but was not alarming and saw a spot on her liver.    Saw dentist and was told she had some "white spots" and that they were going to watch them  Diagnosed with new LBBB in July.   Told to take pulse prior to taking metoprolol and if her pulse is less than 70 bpm, she was instructed not to take the metoprolol.      Allergies  Allergen Reactions  . Aspirin Anaphylaxis  . Celebrex [Celecoxib] Anaphylaxis and Hives  . Percocet [Oxycodone-Acetaminophen] Shortness Of Breath  . Simvastatin-High Dose Hives and Shortness Of Breath    Can tolerate 20 mg dose.   Jonah Blue [Metaxalone] Anaphylaxis  . Morphine Swelling  . Vicodin [Hydrocodone-Acetaminophen] Hives and Other (See Comments)    Feels like her tongue swells  . Albuterol Palpitations   Previous Medications   AMLODIPINE (NORVASC) 5 MG TABLET    Take 1 tablet (5 mg total) by  mouth daily.   CLOPIDOGREL (PLAVIX) 75 MG TABLET    Take 1 tablet (75 mg total) by mouth daily.   FUROSEMIDE (LASIX) 20 MG TABLET    Take 1 tablet (20 mg total) by mouth daily.   ISOSORBIDE MONONITRATE (IMDUR) 30 MG 24 HR TABLET    Take 1 tablet (30 mg total) by mouth daily.   MECLIZINE (ANTIVERT) 25 MG TABLET    Take 1 tablet (25 mg total) by mouth 3 (three) times daily as needed for dizziness.   METOPROLOL TARTRATE (LOPRESSOR) 25 MG TABLET    Take 1 tablet (25 mg total) by mouth 2 (two) times daily.   NITROGLYCERIN (NITROSTAT) 0.4 MG SL TABLET    Place 1 tablet (0.4 mg total) under the tongue every 5 (five) minutes as needed for chest pain.   PANTOPRAZOLE (PROTONIX) 40 MG TABLET    Take 40 mg by mouth daily.   SIMVASTATIN (ZOCOR) 20 MG TABLET    Take 1 tablet (20 mg total) by mouth daily.    Review of Systems  Constitutional: Negative for activity change, appetite change and chills.  HENT: Positive for congestion. Negative for dental problem and drooling.   Eyes: Negative for pain, discharge and itching.  Respiratory: Negative for apnea, choking and chest tightness.   Cardiovascular: Negative for chest pain,  palpitations and leg swelling.  Gastrointestinal: Negative for abdominal distention, abdominal pain and anal bleeding.  Endocrine: Negative for cold intolerance, heat intolerance and polydipsia.  Genitourinary: Negative for difficulty urinating, dyspareunia and dysuria.  Musculoskeletal: Negative for back pain and gait problem.  Allergic/Immunologic: Positive for environmental allergies. Negative for food allergies and immunocompromised state.  Neurological: Negative for dizziness, facial asymmetry and headaches.  Hematological: Does not bruise/bleed easily.  Psychiatric/Behavioral: Negative for agitation, behavioral problems and confusion.    Social History  Substance Use Topics  . Smoking status: Current Every Day Smoker    Packs/day: 0.50    Types: Cigarettes  . Smokeless  tobacco: Never Used  . Alcohol use No   Objective:   BP 104/71   Pulse 74   Temp 98.2 F (36.8 C) (Oral)   Wt 239 lb (108.4 kg)   LMP 03/19/2016   BMI 37.43 kg/m   Physical Exam Constitutional: Well nourished. Alert and oriented, No acute distress. HEENT: [place] AT, moist mucus membranes. Trachea midline, no masses. Cardiovascular: No clubbing, cyanosis, or edema. Respiratory: Normal respiratory effort, no increased work of breathing. GI: Abdomen is soft, non tender, non distended, no abdominal masses. Liver and spleen not palpable.  No hernias appreciated.  Stool sample for occult testing is not indicated.   Skin: No rashes, bruises or suspicious lesions. Lymph: No cervical or inguinal adenopathy. Neurologic: Grossly intact, no focal deficits, moving all 4 extremities. Psychiatric: Normal mood and affect.      Assessment & Plan:    1. LBBB  - seen cardiology  - request records from Dr. Neoma Laming office     2. HTN  - controlled   3. Tobacco abuse  - encouraged to quit  4. Spot on liver  - request records from Dr. April Manson office  Follow up in one month to review Dr.Khan's records  [facility]

## 2016-03-30 DIAGNOSIS — R059 Cough, unspecified: Secondary | ICD-10-CM | POA: Insufficient documentation

## 2016-04-09 ENCOUNTER — Emergency Department (HOSPITAL_COMMUNITY): Payer: Self-pay

## 2016-04-09 ENCOUNTER — Emergency Department (HOSPITAL_COMMUNITY)
Admission: EM | Admit: 2016-04-09 | Discharge: 2016-04-09 | Disposition: A | Discharge status: Home / Self Care | Payer: Self-pay | Attending: Emergency Medicine | Admitting: Emergency Medicine

## 2016-04-09 ENCOUNTER — Encounter (HOSPITAL_COMMUNITY): Payer: Self-pay | Admitting: Emergency Medicine

## 2016-04-09 DIAGNOSIS — R079 Chest pain, unspecified: Secondary | ICD-10-CM

## 2016-04-09 DIAGNOSIS — Z79899 Other long term (current) drug therapy: Secondary | ICD-10-CM | POA: Insufficient documentation

## 2016-04-09 DIAGNOSIS — I252 Old myocardial infarction: Secondary | ICD-10-CM | POA: Insufficient documentation

## 2016-04-09 DIAGNOSIS — R0789 Other chest pain: Secondary | ICD-10-CM | POA: Insufficient documentation

## 2016-04-09 DIAGNOSIS — I110 Hypertensive heart disease with heart failure: Secondary | ICD-10-CM | POA: Insufficient documentation

## 2016-04-09 DIAGNOSIS — I509 Heart failure, unspecified: Secondary | ICD-10-CM | POA: Insufficient documentation

## 2016-04-09 DIAGNOSIS — F17210 Nicotine dependence, cigarettes, uncomplicated: Secondary | ICD-10-CM | POA: Insufficient documentation

## 2016-04-09 DIAGNOSIS — J45909 Unspecified asthma, uncomplicated: Secondary | ICD-10-CM | POA: Insufficient documentation

## 2016-04-09 HISTORY — PX: CARDIAC CATHETERIZATION: SHX172

## 2016-04-09 LAB — BASIC METABOLIC PANEL
ANION GAP: 8 (ref 5–15)
BUN: 11 mg/dL (ref 6–20)
CHLORIDE: 104 mmol/L (ref 101–111)
CO2: 24 mmol/L (ref 22–32)
Calcium: 8.9 mg/dL (ref 8.9–10.3)
Creatinine, Ser: 0.78 mg/dL (ref 0.44–1.00)
GFR calc Af Amer: >60 mL/min (ref >60)
GFR calc non Af Amer: >60 mL/min (ref >60)
GLUCOSE: 114 mg/dL — AB (ref 65–99)
POTASSIUM: 3.4 mmol/L — AB (ref 3.5–5.1)
Sodium: 136 mmol/L (ref 135–145)

## 2016-04-09 LAB — CBC
HEMATOCRIT: 39.6 % (ref 36.0–46.0)
HEMOGLOBIN: 13.5 g/dL (ref 12.0–15.0)
MCH: 29.1 pg (ref 26.0–34.0)
MCHC: 34.1 g/dL (ref 30.0–36.0)
MCV: 85.3 fL (ref 78.0–100.0)
Platelets: 226 10*3/uL (ref 150–400)
RBC: 4.64 MIL/uL (ref 3.87–5.11)
RDW: 14.4 % (ref 11.5–15.5)
WBC: 9.2 10*3/uL (ref 4.0–10.5)

## 2016-04-09 LAB — I-STAT TROPONIN, ED: Troponin i, poc: 0 ng/mL (ref 0.00–0.08)

## 2016-04-09 MED ORDER — NITROGLYCERIN 0.4 MG SL SUBL: 0.4000 mg | SUBLINGUAL_TABLET | SUBLINGUAL | Status: DC | PRN

## 2016-04-09 NOTE — ED Notes (Signed)
Patient transported to X-ray 

## 2016-04-09 NOTE — ED Notes (Signed)
Pt states that she is ready to leave even though second troponin is due at 0430. ED provider at bedside to tell risks of leaving

## 2016-04-09 NOTE — ED Notes (Signed)
Nurse will get labs 

## 2016-04-09 NOTE — ED Triage Notes (Signed)
Pt presents to the ED by [facility] for chest pain. Pt had a heart event last week and was treated at [facility]. Tonight she had the same pain. Took two nitro at home with no relief. EMS gave one nitro with relief. EKG shows Left Bundle Branch per EMS. Vitals are stable.

## 2016-04-09 NOTE — Discharge Instructions (Signed)
Take tylenol 1000mg (2 extra strength) four times a day.   Return for sudden worsening pain.

## 2016-04-09 NOTE — ED Provider Notes (Signed)
[facility] DEPT Provider Note   CSN: [id number] Arrival date & time: 04/09/16  [id number]  By signing my name below, I, Jeanell Sparrow, attest that this documentation has been prepared under the direction and in the presence of Deno Etienne, DO . Electronically Signed: Jeanell Sparrow, Scribe. 04/09/2016. 12:50 AM.  History   Chief Complaint Chief Complaint  Patient presents with  . Chest Pain   The history is provided by the patient. No language interpreter was used.  Chest Pain   This is a new problem. The current episode started 3 to 5 hours ago. The problem occurs constantly. The problem has been gradually improving. The pain is associated with rest. The pain is present in the lateral region. The pain is moderate. The quality of the pain is described as sharp. Radiates to: Downward. Associated symptoms include nausea and shortness of breath. Pertinent negatives include no diaphoresis, no dizziness, no fever, no headaches, no palpitations and no vomiting. She has tried nitroglycerin for the symptoms. The treatment provided moderate relief. Risk factors include smoking/tobacco exposure.  Her past medical history is significant for CHF, hyperlipidemia and MI.  Pertinent negatives for past medical history include no DVT and no PE.  Procedure history is positive for echocardiogram and stress echo.   HPI Comments: Kathy [name] is a 50 y.o. female who presents to the Emergency Department complaining of constant moderate left-sided chest pain that started today. Her pain was onset when she was laying down. She states she took two nitro at home PTA without relief, but one nitro via EMS en route gave relief. She describes the pain as a sharp sensation at initial onset and currently it is dull. Her pain radiates downward. She reports associated SOB and nausea. She admits to a prior hx of MI (2 times) and smoking (0.5 PPD). She denies any exacerbating factors for pain, hx of PE/DVT, vomiting, diaphoresis, or  other complaints.   Past Medical History:  Diagnosis Date  . Asthma   . CHF (congestive heart failure) ([place])   . Hyperlipidemia   . Hypertension   . Myocardial infarction   . Obesity   . Seizures [facility])     Patient Active Problem List   Diagnosis Date Noted  . Chest pain 02/24/2016  . New left bundle branch block (LBBB) 11/05/2015  . Elevated lymphocytes 06/03/2015  . Dyslipidemia 06/03/2015  . Prediabetes 06/03/2015  . History of MI (myocardial infarction) 06/03/2015  . Tobacco abuse 06/03/2015  . Obesity 06/03/2015  . Seizures ([place]) 11/26/2014  . Hypertension 05/21/2014  . CAD (coronary artery disease) 05/21/2014  . Hyperlipidemia 05/21/2014    Past Surgical History:  Procedure Laterality Date  . ANTERIOR CRUCIATE LIGAMENT REPAIR    . BACK SURGERY     L4 and L5  . CARDIAC CATHETERIZATION    . TONSILLECTOMY AND ADENOIDECTOMY  2008  . TUBAL LIGATION      OB History    No data available       Home Medications    Prior to Admission medications   Medication Sig Start Date End Date Taking? Authorizing Provider  amLODipine (NORVASC) 5 MG tablet Take 1 tablet (5 mg total) by mouth daily. 03/23/16  Yes Shannon A McGowan, PA-C  clopidogrel (PLAVIX) 75 MG tablet Take 1 tablet (75 mg total) by mouth daily. 03/23/16  Yes Shannon A McGowan, PA-C  furosemide (LASIX) 20 MG tablet Take 1 tablet (20 mg total) by mouth daily. 03/23/16  Yes Shannon A McGowan, PA-C  isosorbide mononitrate (  IMDUR) 30 MG 24 hr tablet Take 2 tablets (60 mg total) by mouth daily. 03/23/16  Yes Shannon A McGowan, PA-C  meclizine (ANTIVERT) 25 MG tablet Take 1 tablet (25 mg total) by mouth 3 (three) times daily as needed for dizziness. 12/16/15  Yes Shannon A McGowan, PA-C  metoprolol tartrate (LOPRESSOR) 25 MG tablet Take 1 tablet (25 mg total) by mouth 2 (two) times daily. 12/16/15  Yes Shannon A McGowan, PA-C  nitroGLYCERIN (NITROSTAT) 0.4 MG SL tablet Place 1 tablet (0.4 mg total) under the tongue every  5 (five) minutes as needed for chest pain. 12/16/15  Yes Shannon A McGowan, PA-C  pantoprazole (PROTONIX) 40 MG tablet Take 1 tablet (40 mg total) by mouth daily. 03/23/16  Yes Shannon A McGowan, PA-C  simvastatin (ZOCOR) 20 MG tablet Take 1 tablet (20 mg total) by mouth daily. 03/23/16  Yes Nori Riis, PA-C    Family History Family History  Problem Relation Age of Onset  . Kidney disease Sister   . Diabetes Sister   . COPD Sister   . Stroke Sister   . Diabetes Mother   . Stroke Mother   . Heart attack Mother   . Stroke Father   . Asthma Daughter   . Asthma Son     Social History Social History  Substance Use Topics  . Smoking status: Current Every Day Smoker    Packs/day: 0.50    Types: Cigarettes  . Smokeless tobacco: Never Used  . Alcohol use No     Allergies   Aspirin; Celebrex [celecoxib]; Fentanyl; Percocet [oxycodone-acetaminophen]; Simvastatin-high dose; Skelaxin [metaxalone]; Morphine; Vicodin [hydrocodone-acetaminophen]; and Albuterol   Review of Systems Review of Systems  Constitutional: Negative for chills, diaphoresis and fever.  HENT: Negative for congestion and rhinorrhea.   Eyes: Negative for redness and visual disturbance.  Respiratory: Positive for shortness of breath. Negative for wheezing.   Cardiovascular: Positive for chest pain (Left-sided). Negative for palpitations.  Gastrointestinal: Positive for nausea. Negative for vomiting.  Genitourinary: Negative for dysuria and urgency.  Musculoskeletal: Negative for arthralgias and myalgias.  Skin: Negative for pallor and wound.  Neurological: Negative for dizziness and headaches.     Physical Exam Updated Vital Signs BP 138/85 (BP Location: Right Arm)   Pulse 91   Temp 98.1 F (36.7 C) (Oral)   Resp 17   LMP 03/19/2016   SpO2 97%   Physical Exam  Constitutional: She is oriented to person, place, and time. She appears well-developed and well-nourished. No distress.  HENT:  Head:  Normocephalic and atraumatic.  Eyes: EOM are normal. Pupils are equal, round, and reactive to light.  Neck: Normal range of motion. Neck supple.  Cardiovascular: Normal rate and regular rhythm.  Exam reveals no gallop and no friction rub.   No murmur heard. Pulmonary/Chest: Effort normal. She has no wheezes. She has no rales. She exhibits tenderness.  Pain is reproduced with palpation to anterior chest wall.  Abdominal: Soft. She exhibits no distension. There is no tenderness.  Musculoskeletal: She exhibits no edema or tenderness.  Neurological: She is alert and oriented to person, place, and time.  Skin: Skin is warm and dry. She is not diaphoretic.  Psychiatric: She has a normal mood and affect. Her behavior is normal.  Nursing note and vitals reviewed.    ED Treatments / Results  DIAGNOSTIC STUDIES: Oxygen Saturation is 97% on RA, normal by my interpretation.    COORDINATION OF CARE: 12:55 AM- Pt advised of plan for treatment and  pt agrees.  Labs (all labs ordered are listed, but only abnormal results are displayed) Labs Reviewed  BASIC METABOLIC PANEL - Abnormal; Notable for the following:       Result Value   Potassium 3.4 (*)    Glucose, Bld 114 (*)    All other components within normal limits  CBC  I-STAT TROPOININ, ED  I-STAT TROPOININ, ED    EKG  EKG Interpretation  Date/Time:  Sunday April 09 2016 01:23:23 EST Ventricular Rate:  81 PR Interval:    QRS Duration: 166 QT Interval:  431 QTC Calculation: 501 R Axis:     Text Interpretation:  Sinus rhythm Left bundle branch block No significant change since last tracing Confirmed by Daemian Gahm MD, DANIEL ([id number]) on 04/09/2016 1:27:41 AM       Radiology Dg Chest 2 View  Result Date: 04/09/2016 CLINICAL DATA:  Chest pain. Treated for cardiac event last week, similar symptoms tonight. History of hypertension, CHF, tobacco abuse. EXAM: CHEST  2 VIEW COMPARISON:  None. FINDINGS: Cardiomediastinal silhouette is  normal. Mild calcific atherosclerosis of aortic arch. No pleural effusions or focal consolidations. Diffuse bronchitic changes. Trachea projects midline and there is no pneumothorax. Soft tissue planes and included osseous structures are non-suspicious. Mild degenerative change of the thoracic spine. IMPRESSION: Bronchitic changes. Mild atherosclerosis. Electronically Signed   By: Courtnay  Bloomer M.D.   On: 04/09/2016 02:06    Procedures Procedures (including critical care time)  Medications Ordered in ED Medications  nitroGLYCERIN (NITROSTAT) SL tablet 0.4 mg (not administered)     Initial Impression / Assessment and Plan / ED Course  I have reviewed the triage vital signs and the nursing notes.  Pertinent labs & imaging results that were available during my care of the patient were reviewed by me and considered in my medical decision making (see chart for details).  Clinical Course     50  yo F With a chief complaint of chest pain. Patient says this started suddenly is pinpointed left-sided. Worse with palpation and movement of her left arm. Patient was recently cathed for a very similar pain at [facility] 2 weeks ago. At that time she was found to have less than 30% disease in anyone artery. She also had a negative stress test this year. As the patient's symptoms are extremely atypical I will obtain a delta troponin.  The patient reassessed and decided that she not want to wait for her second troponin. She feels that it's unlikely that she doesn't have a heart attack because the EMS personnel told her that her EKG was likely okay. I discussed with her the limitation of blood testing including that there would be a delay to positive resolve especially with her symptoms occurring acutely prior to her coming to the ED. I discussed the possibility of permanent disability if this was a heart attack. She understands this and is requesting to leave now [place].  Discussed smoking  cessation with patient and was they were offerred resources to help stop.  Total time was 5 min CPT code 99406.    5:17 AM:  I have discussed the diagnosis/risks/treatment options with the patient and family and believe the pt to be eligible for discharge home to follow-up with Cards. We also discussed returning to the ED immediately if new or worsening sx occur. We discussed the sx which are most concerning (e.g., sudden worsening pain, fever, inability to tolerate by mouth) that necessitate immediate return. Medications administered to the patient during their visit  and any new prescriptions provided to the patient are listed below.  Medications given during this visit Medications  nitroGLYCERIN (NITROSTAT) SL tablet 0.4 mg (not administered)     The patient appears reasonably screen and/or stabilized for discharge and I doubt any other medical condition or other [facility] requiring further screening, evaluation, or treatment in the ED at this time prior to discharge.    Final Clinical Impressions(s) / ED Diagnoses   Final diagnoses:  Nonspecific chest pain    New Prescriptions Discharge Medication List as of 04/09/2016  4:04 AM     I personally performed the services described in this documentation, which was scribed in my presence. The recorded information has been reviewed and is accurate.      Deno Etienne, DO 04/09/16 [phone]

## 2016-04-13 ENCOUNTER — Encounter: Payer: Self-pay | Admitting: Pharmacist

## 2016-04-20 ENCOUNTER — Ambulatory Visit: Payer: Self-pay | Admitting: Adult Health Nurse Practitioner

## 2016-04-20 VITALS — BP 121/79 | HR 84 | Temp 98.3°F | Wt 247.6 lb

## 2016-04-20 DIAGNOSIS — I25119 Atherosclerotic heart disease of native coronary artery with unspecified angina pectoris: Secondary | ICD-10-CM

## 2016-04-20 MED ORDER — LISINOPRIL 10 MG PO TABS: 20.0000 mg | ORAL_TABLET | Freq: Every day | ORAL | 2 refills | Status: DC

## 2016-04-20 NOTE — Progress Notes (Signed)
Patient: Kathy [name] Female    DOB: Dec 23, 1965   51 y.o.   MRN: [id number] Visit Date: 04/20/2016  Today's Provider: Staci Acosta, NP   Chief Complaint  Patient presents with  . Follow-up    has had 3 hospital visits prior to visit, has EKGs from visits due to "heart spasms"   Subjective:    HPI     FU for CP visit from hospital on 12.31.17.  Pt was seen on 11.16, 12.21 (at [place]) and 12. 31.  She states that she was told at [facility] she had an "electrical problem" in her heart.  She was supposed to follow up with a cardiology- but has no insurance.  Saw cardiology in Nov 2017- nothing concerning per pt report.  EF 40-55% with normal LV function.  Cardiac cath with insignificant coronary artery disease.       Allergies  Allergen Reactions  . Aspirin Anaphylaxis  . Celebrex [Celecoxib] Anaphylaxis and Hives  . Fentanyl Anaphylaxis  . Percocet [Oxycodone-Acetaminophen] Shortness Of Breath  . Simvastatin-High Dose Hives and Shortness Of Breath    Can tolerate 20 mg dose.   Jonah Blue [Metaxalone] Anaphylaxis  . Morphine Swelling  . Vicodin [Hydrocodone-Acetaminophen] Hives and Other (See Comments)    Feels like her tongue swells  . Albuterol Palpitations   Previous Medications   AMLODIPINE (NORVASC) 5 MG TABLET    Take 1 tablet (5 mg total) by mouth daily.   CLOPIDOGREL (PLAVIX) 75 MG TABLET    Take 1 tablet (75 mg total) by mouth daily.   FUROSEMIDE (LASIX) 20 MG TABLET    Take 1 tablet (20 mg total) by mouth daily.   ISOSORBIDE MONONITRATE (IMDUR) 30 MG 24 HR TABLET    Take 2 tablets (60 mg total) by mouth daily.   MECLIZINE (ANTIVERT) 25 MG TABLET    Take 1 tablet (25 mg total) by mouth 3 (three) times daily as needed for dizziness.   METOPROLOL TARTRATE (LOPRESSOR) 25 MG TABLET    Take 1 tablet (25 mg total) by mouth 2 (two) times daily.   NITROGLYCERIN (NITROSTAT) 0.4 MG SL TABLET    Place 1 tablet (0.4 mg total) under the tongue every 5 (five) minutes as needed  for chest pain.   PANTOPRAZOLE (PROTONIX) 40 MG TABLET    Take 1 tablet (40 mg total) by mouth daily.   POTASSIUM CHLORIDE SA (K-DUR,KLOR-CON) 20 MEQ TABLET    Take 20 mEq by mouth 2 (two) times daily.   SIMVASTATIN (ZOCOR) 20 MG TABLET    Take 1 tablet (20 mg total) by mouth daily.    Review of Systems  All other systems reviewed and are negative.   Social History  Substance Use Topics  . Smoking status: Current Every Day Smoker    Packs/day: 0.50    Types: Cigarettes  . Smokeless tobacco: Never Used  . Alcohol use No   Objective:   BP 121/79   Pulse 84   Temp 98.3 F (36.8 C)   Wt 247 lb 9.6 oz (112.3 kg)   LMP 03/19/2016   BMI 38.78 kg/m   Physical Exam  Constitutional: She is oriented to person, place, and time. She appears well-developed and well-nourished.  HENT:  Head: Normocephalic and atraumatic.  Eyes: Pupils are equal, round, and reactive to light.  Neck: Normal range of motion. Neck supple.  Cardiovascular: Normal rate, regular rhythm and normal heart sounds.   Pulmonary/Chest: Effort normal and breath sounds normal.  Abdominal: Soft. Bowel sounds  are normal.  Neurological: She is alert and oriented to person, place, and time.  Skin: Skin is warm and dry.  Psychiatric: She has a normal mood and affect.  Vitals reviewed.       Assessment & Plan:         Per records pt essentially with no life threatening cardiac conditions.  FU with cardiology- needs charity care- application given.  Given hospital precautions.  Use Nitro PRN.  Discontinue norvasc and start lisinopril 10mg   per [facility] recommendations.    Consider changing to high intensity statin due to CV risks at next OV.  FU BP check at next visit.   Giving Quit line number for smoking cessation.    Staci Acosta, NP   [facility]

## 2016-04-21 ENCOUNTER — Telehealth: Payer: Self-pay

## 2016-04-21 NOTE — Telephone Encounter (Signed)
PT told Earnest Bailey that Teah wanted her to see a cardiologist. I went to look for the referral and there is not one. Per Teah's not she wanted pt to F/U with her cardiologist she saw in Nov 2017. Called pt and discussed this with pt and she stated that the cardiologist she saw was with [facility] Clementeen Graham. She would call them and see if they are affiliated with [facility] and if they accept charity care. IF not she will call me back so I can request a referral for pt if that is what Teah wants to do.

## 2016-05-15 ENCOUNTER — Ambulatory Visit: Payer: Self-pay | Admitting: Pharmacist

## 2016-05-15 ENCOUNTER — Encounter: Payer: Self-pay | Admitting: Pharmacist

## 2016-05-15 ENCOUNTER — Encounter (INDEPENDENT_AMBULATORY_CARE_PROVIDER_SITE_OTHER): Payer: Self-pay

## 2016-05-15 VITALS — BP 120/80 | Wt 253.0 lb

## 2016-05-15 DIAGNOSIS — Z79899 Other long term (current) drug therapy: Secondary | ICD-10-CM

## 2016-05-15 NOTE — Progress Notes (Addendum)
Medication Management Clinic Visit Note  Patient: Kathy [name] MRN: [id number] Date of Birth: Mar 15, 1966 PCP: Pcp Not In [place] 51 y.o. female presents for an Initial MTM visit today.  BP 120/80   Wt 253 lb (114.8 kg)   BMI 39.63 kg/m   Patient Information   Past Medical History:  Diagnosis Date  . Asthma   . CHF (congestive heart failure) ([place])   . Hyperlipidemia   . Hypertension   . Myocardial infarction   . Obesity   . Seizures ([place])       Past Surgical History:  Procedure Laterality Date  . ANTERIOR CRUCIATE LIGAMENT REPAIR    . BACK SURGERY     L4 and L5  . CARDIAC CATHETERIZATION    . TONSILLECTOMY AND ADENOIDECTOMY  2008  . TUBAL LIGATION       Family History  Problem Relation Age of Onset  . Kidney disease Sister   . Diabetes Sister   . COPD Sister   . Stroke Sister   . Diabetes Mother   . Stroke Mother   . Heart attack Mother   . Stroke Father   . Asthma Daughter   . Asthma Son     New Diagnoses (since last visit): n/a  Family Support: fiance lives with her and they help each other with dietary/lifestyle changes  Lifestyle Diet: Eats late brunch: egg, fried potatoes in canola/olive oil Dinner:meatloaf, chicken and rice soup (homemade) uses only low sodium soup/sauces Drinks:only drinks water    Current Exercise Habits: The patient does not participate in regular exercise at present    History  Alcohol Use No      History  Smoking Status  . Current Every Day Smoker  . Packs/day: 0.50  . Types: Cigarettes  Smokeless Tobacco  . Never Used      Health Maintenance  Topic Date Due  . HIV Screening  05/31/1981  . TETANUS/TDAP  05/31/1985  . PAP SMEAR  06/01/1987  . INFLUENZA VACCINE  Completed     Prior to Admission medications   Medication Sig Start Date End Date Taking? Authorizing Provider  clopidogrel (PLAVIX) 75 MG tablet Take 1 tablet (75 mg total) by mouth daily. 03/23/16  Yes Shannon A McGowan, PA-C   Fluticasone-Salmeterol (ADVAIR) 100-50 MCG/DOSE AEPB Inhale 1 puff into the lungs 2 (two) times daily.   Yes Historical Provider, MD  furosemide (LASIX) 20 MG tablet Take 1 tablet (20 mg total) by mouth daily. 03/23/16  Yes Shannon A McGowan, PA-C  isosorbide mononitrate (IMDUR) 30 MG 24 hr tablet Take 2 tablets (60 mg total) by mouth daily. 03/23/16  Yes Shannon A McGowan, PA-C  lisinopril (PRINIVIL,ZESTRIL) 10 MG tablet Take 2 tablets (20 mg total) by mouth daily. 04/20/16  Yes Teah Doles-Johnson, NP  meclizine (ANTIVERT) 25 MG tablet Take 1 tablet (25 mg total) by mouth 3 (three) times daily as needed for dizziness. 12/16/15  Yes Shannon A McGowan, PA-C  metoprolol tartrate (LOPRESSOR) 25 MG tablet Take 1 tablet (25 mg total) by mouth 2 (two) times daily. 12/16/15  Yes Shannon A McGowan, PA-C  nitroGLYCERIN (NITROSTAT) 0.4 MG SL tablet Place 1 tablet (0.4 mg total) under the tongue every 5 (five) minutes as needed for chest pain. 12/16/15  Yes Shannon A McGowan, PA-C  pantoprazole (PROTONIX) 40 MG tablet Take 1 tablet (40 mg total) by mouth daily. 03/23/16  Yes Shannon A McGowan, PA-C  potassium chloride SA (K-DUR,KLOR-CON) 20 MEQ tablet Take 20 mEq by  mouth 2 (two) times daily.   Yes Historical Provider, MD  simvastatin (ZOCOR) 20 MG tablet Take 1 tablet (20 mg total) by mouth daily. 03/23/16  Yes Nori Riis, PA-C     Assessment and Plan:  Compliance/Adherance: pt knows names, directions, and indications of all her medications without prompting. Her MPR per our pharmacy software is 100%.  HTN: BP this visit 120/80; well controlled on metoprolol - states she does not take if HR below 70 per MD instruction. Pt was previously on lisinopril and had a reaction (noted in allergies) and it was stopped by provider.   CHF/Angina: EF 40-55% (11/17) states that isosorbide MN has been increased to 60mg  bid and since then her chest pain has improved. She has a new bottle of NTG and has not had to use it  since Iso MN was increased. She is taking potassium, furosemide and states that she rarely has any ankle swellling.  GERD: well-controlled on pantoprazole, pt has made diet modifications  HLD: Lipid panel 02/24/16; LDL 81; TG 158; HDL 23  - pt is on simvastatin 20mg  - has a reaction on higher doses    Netta Neat, PharmD, [facility]) [phone]

## 2016-06-15 ENCOUNTER — Ambulatory Visit: Payer: Self-pay | Admitting: Adult Health Nurse Practitioner

## 2016-06-15 VITALS — BP 136/97 | HR 73 | Temp 98.1°F | Wt 255.6 lb

## 2016-06-15 DIAGNOSIS — I25119 Atherosclerotic heart disease of native coronary artery with unspecified angina pectoris: Secondary | ICD-10-CM

## 2016-06-15 DIAGNOSIS — I10 Essential (primary) hypertension: Secondary | ICD-10-CM

## 2016-06-15 DIAGNOSIS — E782 Mixed hyperlipidemia: Secondary | ICD-10-CM

## 2016-06-15 MED ORDER — POTASSIUM CHLORIDE CRYS ER 20 MEQ PO TBCR: 20.0000 meq | EXTENDED_RELEASE_TABLET | Freq: Every day | ORAL | 1 refills | Status: DC

## 2016-06-15 NOTE — Progress Notes (Signed)
Patient: Kathy [name] Female    DOB: [birth year]   51 y.o.   MRN: [id number] Visit Date: 06/15/2016  Today's Provider: Staci Acosta, NP   Chief Complaint  Patient presents with  . Follow-up   Subjective:    HPI  Pt states she saw the Cardiologist in Jan and he increased her Isosorbide to 120mg  daily.  Denies any current CP.    HTN:  Taking medications as directed.  Denies HA, Dizziness.   HLD:  Taking Zocor as directed.  Denies myalgia or abdominal pain.       Allergies  Allergen Reactions  . Aspirin Anaphylaxis  . Celebrex [Celecoxib] Anaphylaxis and Hives  . Fentanyl Anaphylaxis  . Lisinopril Shortness Of Breath  . Percocet [Oxycodone-Acetaminophen] Shortness Of Breath  . Simvastatin-High Dose Hives and Shortness Of Breath    Can tolerate 20 mg dose.   Jonah Blue [Metaxalone] Anaphylaxis  . Morphine Swelling  . Vicodin [Hydrocodone-Acetaminophen] Hives and Other (See Comments)    Feels like her tongue swells  . Albuterol Palpitations   Previous Medications   CLOPIDOGREL (PLAVIX) 75 MG TABLET    Take 1 tablet (75 mg total) by mouth daily.   FLUTICASONE-SALMETEROL (ADVAIR) 100-50 MCG/DOSE AEPB    Inhale 1 puff into the lungs 2 (two) times daily.   FUROSEMIDE (LASIX) 20 MG TABLET    Take 1 tablet (20 mg total) by mouth daily.   ISOSORBIDE MONONITRATE (IMDUR) 30 MG 24 HR TABLET    Take 2 tablets (60 mg total) by mouth daily.   MECLIZINE (ANTIVERT) 25 MG TABLET    Take 1 tablet (25 mg total) by mouth 3 (three) times daily as needed for dizziness.   METOPROLOL TARTRATE (LOPRESSOR) 25 MG TABLET    Take 1 tablet (25 mg total) by mouth 2 (two) times daily.   NITROGLYCERIN (NITROSTAT) 0.4 MG SL TABLET    Place 1 tablet (0.4 mg total) under the tongue every 5 (five) minutes as needed for chest pain.   PANTOPRAZOLE (PROTONIX) 40 MG TABLET    Take 1 tablet (40 mg total) by mouth daily.   POTASSIUM CHLORIDE SA (K-DUR,KLOR-CON) 20 MEQ TABLET    Take 20 mEq by mouth 2 (two)  times daily.   SIMVASTATIN (ZOCOR) 20 MG TABLET    Take 1 tablet (20 mg total) by mouth daily.    Review of Systems  All other systems reviewed and are negative.   Social History  Substance Use Topics  . Smoking status: Current Every Day Smoker    Packs/day: 0.50    Types: Cigarettes  . Smokeless tobacco: Never Used  . Alcohol use No   Objective:   BP (!) 136/97   Pulse 73   Temp 98.1 F (36.7 C)   Wt 255 lb 9.6 oz (115.9 kg)   LMP 06/04/2016   BMI 40.03 kg/m   Physical Exam  Constitutional: She is oriented to person, place, and time. She appears well-developed and well-nourished.  HENT:  Head: Normocephalic and atraumatic.  Eyes: Pupils are equal, round, and reactive to light.  Cardiovascular: Normal rate, regular rhythm and normal heart sounds.   Pulmonary/Chest: Effort normal and breath sounds normal.  Abdominal: Soft. Bowel sounds are normal.  Neurological: She is alert and oriented to person, place, and time.  Skin: Skin is warm and dry.  Psychiatric: She has a normal mood and affect.        Assessment & Plan:           Follow  up with cardiology as indicated.  Order CMP, A1C and lipids today.   HLD:  Controlled.   Continue current regimen.  Encourage low cholesterol, low fat diet and exercise.   HTN:  Borderline  Goal BP <140/90.  Continue current medication regimen.  Encourage low salt diet and exercise.    Staci Acosta, NP   [facility]

## 2016-06-20 ENCOUNTER — Other Ambulatory Visit: Payer: Self-pay

## 2016-06-20 DIAGNOSIS — Z0000 Encounter for general adult medical examination without abnormal findings: Secondary | ICD-10-CM

## 2016-06-20 DIAGNOSIS — R7303 Prediabetes: Secondary | ICD-10-CM

## 2016-06-20 NOTE — Progress Notes (Unsigned)
poc

## 2016-06-21 LAB — COMPREHENSIVE METABOLIC PANEL
A/G RATIO: 1.4 (ref 1.2–2.2)
ALT: 16 IU/L (ref 0–32)
AST: 12 IU/L (ref 0–40)
Albumin: 3.9 g/dL (ref 3.5–5.5)
Alkaline Phosphatase: 93 IU/L (ref 39–117)
BUN/Creatinine Ratio: 8 — ABNORMAL LOW (ref 9–23)
BUN: 6 mg/dL (ref 6–24)
Bilirubin Total: 0.3 mg/dL (ref 0.0–1.2)
CALCIUM: 8.9 mg/dL (ref 8.7–10.2)
CO2: 24 mmol/L (ref 18–29)
Chloride: 101 mmol/L (ref 96–106)
Creatinine, Ser: 0.74 mg/dL (ref 0.57–1.00)
GFR calc Af Amer: 109 mL/min/{1.73_m2} (ref >59)
GFR, EST NON AFRICAN AMERICAN: 95 mL/min/{1.73_m2} (ref >59)
GLUCOSE: 82 mg/dL (ref 65–99)
Globulin, Total: 2.8 g/dL (ref 1.5–4.5)
Potassium: 4.5 mmol/L (ref 3.5–5.2)
Sodium: 141 mmol/L (ref 134–144)
Total Protein: 6.7 g/dL (ref 6.0–8.5)

## 2016-06-21 LAB — HEMOGLOBIN A1C
ESTIMATED AVERAGE GLUCOSE: 117 mg/dL
Hgb A1c MFr Bld: 5.7 % — ABNORMAL HIGH (ref 4.8–5.6)

## 2016-06-21 LAB — LIPID PANEL
Chol/HDL Ratio: 5.6 ratio units — ABNORMAL HIGH (ref 0.0–4.4)
Cholesterol, Total: 161 mg/dL (ref 100–199)
HDL: 29 mg/dL — ABNORMAL LOW (ref >39)
LDL Calculated: 100 mg/dL — ABNORMAL HIGH (ref 0–99)
Triglycerides: 159 mg/dL — ABNORMAL HIGH (ref 0–149)
VLDL Cholesterol Cal: 32 mg/dL (ref 5–40)

## 2016-10-17 ENCOUNTER — Ambulatory Visit: Payer: Self-pay | Admitting: Adult Health Nurse Practitioner

## 2016-10-17 VITALS — BP 134/81 | HR 77 | Temp 98.2°F | Wt 256.0 lb

## 2016-10-17 DIAGNOSIS — I25119 Atherosclerotic heart disease of native coronary artery with unspecified angina pectoris: Secondary | ICD-10-CM

## 2016-10-17 DIAGNOSIS — R7303 Prediabetes: Secondary | ICD-10-CM

## 2016-10-17 DIAGNOSIS — E782 Mixed hyperlipidemia: Secondary | ICD-10-CM

## 2016-10-17 DIAGNOSIS — I10 Essential (primary) hypertension: Secondary | ICD-10-CM

## 2016-10-17 MED ORDER — METOPROLOL TARTRATE 25 MG PO TABS: 25.0000 mg | ORAL_TABLET | Freq: Two times a day (BID) | ORAL | 6 refills | Status: DC

## 2016-10-17 MED ORDER — NITROGLYCERIN 0.4 MG SL SUBL: 0.4000 mg | SUBLINGUAL_TABLET | SUBLINGUAL | 6 refills | Status: DC | PRN

## 2016-10-17 MED ORDER — PANTOPRAZOLE SODIUM 40 MG PO TBEC: 40.0000 mg | DELAYED_RELEASE_TABLET | Freq: Every day | ORAL | 3 refills | Status: DC

## 2016-10-17 MED ORDER — CLOPIDOGREL BISULFATE 75 MG PO TABS: 75.0000 mg | ORAL_TABLET | Freq: Every day | ORAL | 3 refills | Status: DC

## 2016-10-17 MED ORDER — POTASSIUM CHLORIDE CRYS ER 20 MEQ PO TBCR: 20.0000 meq | EXTENDED_RELEASE_TABLET | Freq: Every day | ORAL | 1 refills | Status: DC

## 2016-10-17 MED ORDER — ISOSORBIDE MONONITRATE ER 30 MG PO TB24: 60.0000 mg | ORAL_TABLET | Freq: Every day | ORAL | 3 refills | Status: DC

## 2016-10-17 MED ORDER — FUROSEMIDE 20 MG PO TABS: 20.0000 mg | ORAL_TABLET | Freq: Every day | ORAL | 3 refills | Status: DC

## 2016-10-17 MED ORDER — SIMVASTATIN 20 MG PO TABS: 20.0000 mg | ORAL_TABLET | Freq: Every day | ORAL | 3 refills | Status: DC

## 2016-10-17 NOTE — Progress Notes (Signed)
  Patient: Kathy [name] Female    DOB: Mar 11, 1966   51 y.o.   MRN: [id number] Visit Date: 10/17/2016  Today's Provider: Staci Acosta, NP   Chief Complaint  Patient presents with  . Follow-up   Subjective:    HPI  Here for FU. Taking medications as directed.  Denies any abnormal bleeding.   Pt states that she is having syncopal episodes- she passed out last month. Only once since last visit in March.  Pt states that she having some CP that is relieved by Nitro, known atypical chest pain. States she failed her stress test.  Known L BBB.  States that during that time she didn't have AC at home and since she has had AC no episodes.  Was evaluated by EMS who thought she got over heated.   Denies dizziness or SOB.      Allergies  Allergen Reactions  . Aspirin Anaphylaxis  . Celebrex [Celecoxib] Anaphylaxis and Hives  . Fentanyl Anaphylaxis  . Lisinopril Shortness Of Breath  . Percocet [Oxycodone-Acetaminophen] Shortness Of Breath  . Simvastatin-High Dose Hives and Shortness Of Breath    Can tolerate 20 mg dose.   Jonah Blue [Metaxalone] Anaphylaxis  . Morphine Swelling  . Vicodin [Hydrocodone-Acetaminophen] Hives and Other (See Comments)    Feels like her tongue swells  . Albuterol Palpitations   Previous Medications   MECLIZINE (ANTIVERT) 25 MG TABLET    Take 1 tablet (25 mg total) by mouth 3 (three) times daily as needed for dizziness.    Review of Systems  All other systems reviewed and are negative.   Social History  Substance Use Topics  . Smoking status: Current Every Day Smoker    Packs/day: 0.50    Types: Cigarettes  . Smokeless tobacco: Never Used  . Alcohol use No   Objective:   BP 134/81   Pulse 77   Temp 98.2 F (36.8 C)   Wt 256 lb (116.1 kg)   BMI 40.10 kg/m   Physical Exam  Constitutional: She is oriented to person, place, and time. She appears well-developed and well-nourished.  HENT:  Head: Normocephalic and atraumatic.  Eyes:  Pupils are equal, round, and reactive to light.  Neck: Normal range of motion. Neck supple.  Cardiovascular: Normal rate, regular rhythm, normal heart sounds and intact distal pulses.   No pedal edema.   Pulmonary/Chest: Effort normal and breath sounds normal.  Abdominal: Soft. Bowel sounds are normal.  Neurological: She is alert and oriented to person, place, and time.  CN grossly intact  Skin: Skin is warm and dry.  Vitals reviewed.       Assessment & Plan:         HTN:  Controlled.   Goal BP <140/80.  Continue current medication regimen.  Encourage low salt diet and exercise.  FU with Cardiology.  Continue Nitro as needed for CP.  ER precautions given.    HLD:  Controlled.  Continue current regimen.  Encourage low cholesterol, low fat diet and exercise.   Given application to reapply for charity care.  Encourage smoking cessation.     Staci Acosta, NP   [facility] [place]

## 2016-10-20 DIAGNOSIS — Z760 Encounter for issue of repeat prescription: Secondary | ICD-10-CM | POA: Insufficient documentation

## 2016-10-21 ENCOUNTER — Encounter: Payer: Self-pay | Admitting: Emergency Medicine

## 2016-10-21 ENCOUNTER — Emergency Department
Admission: EM | Admit: 2016-10-21 | Discharge: 2016-10-21 | Discharge status: Against Medical Advice | Payer: Self-pay | Attending: Emergency Medicine | Admitting: Emergency Medicine

## 2016-10-21 NOTE — ED Triage Notes (Signed)
Pt states that she is out of her extended release nitro and that her pharmacy does not open until Monday. Pt has hx of MI and is ambulatory with NAD at this time.

## 2016-10-21 NOTE — ED Notes (Signed)
Pt not found in lobby or outside  

## 2016-10-26 ENCOUNTER — Other Ambulatory Visit: Payer: Self-pay | Admitting: Adult Health Nurse Practitioner

## 2016-10-26 MED ORDER — ISOSORBIDE MONONITRATE ER 60 MG PO TB24: ORAL_TABLET | ORAL | 3 refills | Status: DC

## 2016-11-02 ENCOUNTER — Other Ambulatory Visit: Payer: Self-pay

## 2016-11-02 DIAGNOSIS — I10 Essential (primary) hypertension: Principal | ICD-10-CM

## 2016-11-02 DIAGNOSIS — E1159 Type 2 diabetes mellitus with other circulatory complications: Secondary | ICD-10-CM

## 2016-11-02 MED ORDER — ISOSORBIDE MONONITRATE ER 60 MG PO TB24: ORAL_TABLET | ORAL | 3 refills | Status: DC

## 2016-11-29 ENCOUNTER — Telehealth: Payer: Self-pay | Admitting: Pharmacy Technician

## 2016-11-29 NOTE — Telephone Encounter (Signed)
Patient eligible to receive medication assistance at [facility] through 2018, as long as eligibility requirements continue to be met.  Dezi Brauner J. Layla Gramm Care Manager [facility] 

## 2017-01-09 ENCOUNTER — Other Ambulatory Visit: Payer: Self-pay

## 2017-01-09 DIAGNOSIS — E782 Mixed hyperlipidemia: Secondary | ICD-10-CM

## 2017-01-09 DIAGNOSIS — I25119 Atherosclerotic heart disease of native coronary artery with unspecified angina pectoris: Secondary | ICD-10-CM

## 2017-01-09 DIAGNOSIS — I10 Essential (primary) hypertension: Secondary | ICD-10-CM

## 2017-01-10 LAB — LIPID PANEL
Chol/HDL Ratio: 5.9 ratio — ABNORMAL HIGH (ref 0.0–4.4)
Cholesterol, Total: 154 mg/dL (ref 100–199)
HDL: 26 mg/dL — ABNORMAL LOW (ref >39)
LDL Calculated: 89 mg/dL (ref 0–99)
Triglycerides: 194 mg/dL — ABNORMAL HIGH (ref 0–149)
VLDL CHOLESTEROL CAL: 39 mg/dL (ref 5–40)

## 2017-01-10 LAB — COMPREHENSIVE METABOLIC PANEL
A/G RATIO: 1.7 (ref 1.2–2.2)
ALBUMIN: 4.5 g/dL (ref 3.5–5.5)
ALT: 14 IU/L (ref 0–32)
AST: 15 IU/L (ref 0–40)
Alkaline Phosphatase: 90 IU/L (ref 39–117)
BILIRUBIN TOTAL: 0.3 mg/dL (ref 0.0–1.2)
BUN / CREAT RATIO: 13 (ref 9–23)
BUN: 11 mg/dL (ref 6–24)
CHLORIDE: 100 mmol/L (ref 96–106)
CO2: 25 mmol/L (ref 20–29)
Calcium: 9.4 mg/dL (ref 8.7–10.2)
Creatinine, Ser: 0.86 mg/dL (ref 0.57–1.00)
GFR calc non Af Amer: 79 mL/min/{1.73_m2} (ref >59)
GFR, EST AFRICAN AMERICAN: 91 mL/min/{1.73_m2} (ref >59)
GLOBULIN, TOTAL: 2.7 g/dL (ref 1.5–4.5)
Glucose: 69 mg/dL (ref 65–99)
POTASSIUM: 4.5 mmol/L (ref 3.5–5.2)
Sodium: 139 mmol/L (ref 134–144)
TOTAL PROTEIN: 7.2 g/dL (ref 6.0–8.5)

## 2017-01-11 ENCOUNTER — Ambulatory Visit: Payer: Self-pay | Admitting: Adult Health Nurse Practitioner

## 2017-01-11 VITALS — BP 153/78 | HR 74 | Temp 97.5°F | Wt 256.8 lb

## 2017-01-11 DIAGNOSIS — I25119 Atherosclerotic heart disease of native coronary artery with unspecified angina pectoris: Secondary | ICD-10-CM

## 2017-01-11 DIAGNOSIS — I10 Essential (primary) hypertension: Secondary | ICD-10-CM

## 2017-01-11 NOTE — Progress Notes (Signed)
Patient: Kathy [name] Female    DOB: [birth year]   51 y.o.   MRN: [id number] Visit Date: 01/11/2017  Today's Provider: [place]   Chief Complaint  Patient presents with  . Chest Pain    Increased her nitro tabs to 15/mo and feels like it is not helping. Waiting on charity care to see cardio   Subjective:    Kathy [name] is a 51 y/o with PMHx of HTN, HLD, 3x MI here fo f/u.  Submitted charity care application here in August; has not heard anything back yet.   Chest pain Has chest pain every day. Taking nitro every other day; has to take up to 2 tabs. Chest pain resolves within a few minutes. Has had chest pain at rest for almost a year. Thinks chest pain getting more frequent. Positive stress test in 03/2016. Still smoking 5 cigarettes/day.k     Chest Pain         Allergies  Allergen Reactions  . Aspirin Anaphylaxis  . Celebrex [Celecoxib] Anaphylaxis and Hives  . Fentanyl Anaphylaxis  . Lisinopril Shortness Of Breath  . Percocet [Oxycodone-Acetaminophen] Shortness Of Breath  . Simvastatin-High Dose Hives and Shortness Of Breath    Can tolerate 20 mg dose.   Kathy [name] [Metaxalone] Anaphylaxis  . Morphine Swelling  . Vicodin [Hydrocodone-Acetaminophen] Hives and Other (See Comments)    Feels like her tongue swells  . Albuterol Palpitations   Previous Medications   CLOPIDOGREL (PLAVIX) 75 MG TABLET    Take 1 tablet (75 mg total) by mouth daily.   FUROSEMIDE (LASIX) 20 MG TABLET    Take 1 tablet (20 mg total) by mouth daily.   ISOSORBIDE MONONITRATE (IMDUR) 60 MG 24 HR TABLET    1 tablet  twice daily   MECLIZINE (ANTIVERT) 25 MG TABLET    Take 1 tablet (25 mg total) by mouth 3 (three) times daily as needed for dizziness.   METOPROLOL TARTRATE (LOPRESSOR) 25 MG TABLET    Take 1 tablet (25 mg total) by mouth 2 (two) times daily.   NITROGLYCERIN (NITROSTAT) 0.4 MG SL TABLET    Place 1 tablet (0.4 mg total) under the tongue every 5 (five) minutes as needed  for chest pain.   PANTOPRAZOLE (PROTONIX) 40 MG TABLET    Take 1 tablet (40 mg total) by mouth daily.   POTASSIUM CHLORIDE SA (K-DUR,KLOR-CON) 20 MEQ TABLET    Take 1 tablet (20 mEq total) by mouth daily.   SIMVASTATIN (ZOCOR) 20 MG TABLET    Take 1 tablet (20 mg total) by mouth daily.    Review of Systems  Cardiovascular: Positive for chest pain.    Social History  Substance Use Topics  . Smoking status: Current Every Day Smoker    Packs/day: 0.50    Types: Cigarettes  . Smokeless tobacco: Never Used     Comment: Decreased smoking to less than 5 cigs/day  . Alcohol use No   Objective:   BP (!) 153/78   Pulse 74   Temp (!) 97.5 F (36.4 C)   Wt 256 lb 12.8 oz (116.5 kg)   LMP 01/06/2017 (Exact Date)   BMI 41.45 kg/m   Physical Exam  Constitutional: She appears well-developed and well-nourished.  HENT:  Head: Normocephalic and atraumatic.  Eyes: Conjunctivae are normal.  Cardiovascular: An irregularly irregular rhythm present.  Occasional extrasystoles are present. Exam reveals distant heart sounds.   Murmur heard.  Systolic murmur is present with a grade of 2/6  Pulmonary/Chest:  Effort normal and breath sounds normal.        Assessment & Plan:     Kathy [name] is 51 y/o with PMHx significant for HTN, HLD, 3xMI here for f/u  Chest pain Ongoing typical angina, per patient frequency of pain more frequent over past year. Will follow-up on charity care application to get her to cardiology. Can not increase dose of statin or BP regimen due to patient adverse reactions in the past. Counseled patient on tobacco cessation. Follow-up in 3 months.       [facility] [place]

## 2017-01-16 ENCOUNTER — Ambulatory Visit: Payer: Self-pay

## 2017-02-08 ENCOUNTER — Telehealth: Payer: Self-pay

## 2017-02-08 DIAGNOSIS — R079 Chest pain, unspecified: Secondary | ICD-10-CM

## 2017-02-08 NOTE — Telephone Encounter (Signed)
Called pt and discussed her charity care she is approved until Feb 2019. Also explained to pt about the cardiologist appt and if she does not here from them to call me within the next week or so. Pt verbalized understanding.

## 2017-02-08 NOTE — Telephone Encounter (Signed)
Called pt with charity care info and let pt know her referral for cardiologist has been sent in and to wait for a call from them. Pt verbalized understanding.

## 2017-02-23 ENCOUNTER — Ambulatory Visit (INDEPENDENT_AMBULATORY_CARE_PROVIDER_SITE_OTHER): Payer: Self-pay | Admitting: Cardiovascular Disease

## 2017-02-23 ENCOUNTER — Encounter: Payer: Self-pay | Admitting: Cardiovascular Disease

## 2017-02-23 VITALS — BP 110/66 | HR 66 | Ht 67.0 in | Wt 256.5 lb

## 2017-02-23 DIAGNOSIS — R011 Cardiac murmur, unspecified: Secondary | ICD-10-CM

## 2017-02-23 DIAGNOSIS — Z720 Tobacco use: Secondary | ICD-10-CM

## 2017-02-23 DIAGNOSIS — I25118 Atherosclerotic heart disease of native coronary artery with other forms of angina pectoris: Secondary | ICD-10-CM

## 2017-02-23 DIAGNOSIS — I10 Essential (primary) hypertension: Secondary | ICD-10-CM

## 2017-02-23 DIAGNOSIS — E782 Mixed hyperlipidemia: Secondary | ICD-10-CM

## 2017-02-23 NOTE — Patient Instructions (Signed)
Medication Instructions:  Your physician recommends that you continue on your current medications as directed. Please refer to the Current Medication list given to you today.   Labwork: none  Testing/Procedures: Your physician has requested that you have an echocardiogram. Echocardiography is a painless test that uses sound waves to create images of your heart. It provides your doctor with information about the size and shape of your heart and how well your heart's chambers and valves are working. This procedure takes approximately one hour. There are no restrictions for this procedure.    Follow-Up: Your physician wants you to follow-up in: [place]. You will receive a reminder letter in the mail two months in advance. If you don't receive a letter, please call our office to schedule the follow-up appointment.    Steps to Quit Smoking Smoking tobacco can be bad for your health. It can also affect almost every organ in your body. Smoking puts you and people around you at risk for many serious long-lasting (chronic) diseases. Quitting smoking is hard, but it is one of the best things that you can do for your health. It is never too late to quit. What are the benefits of quitting smoking? When you quit smoking, you lower your risk for getting serious diseases and conditions. They can include:  Lung cancer or lung disease.  Heart disease.  Stroke.  Heart attack.  Not being able to have children (infertility).  Weak bones (osteoporosis) and broken bones (fractures).  If you have coughing, wheezing, and shortness of breath, those symptoms may get better when you quit. You may also get sick less often. If you are pregnant, quitting smoking can help to lower your chances of having a baby of low birth weight. What can I do to help me quit smoking? Talk with your doctor about what can help you quit smoking. Some things you can do (strategies) include:  Quitting smoking  totally, instead of slowly cutting back how much you smoke over a period of time.  Going to in-person counseling. You are more likely to quit if you go to many counseling sessions.  Using resources and support systems, such as: ? Database administrator with a Social worker. ? Phone quitlines. ? Careers information officer. ? Support groups or group counseling. ? Text messaging programs. ? Mobile phone apps or applications.  Taking medicines. Some of these medicines may have nicotine in them. If you are pregnant or breastfeeding, do not take any medicines to quit smoking unless your doctor says it is okay. Talk with your doctor about counseling or other things that can help you.  Talk with your doctor about using more than one strategy at the same time, such as taking medicines while you are also going to in-person counseling. This can help make quitting easier. What things can I do to make it easier to quit? Quitting smoking might feel very hard at first, but there is a lot that you can do to make it easier. Take these steps:  Talk to your family and friends. Ask them to support and encourage you.  Call phone quitlines, reach out to support groups, or work with a Social worker.  Ask people who smoke to not smoke around you.  Avoid places that make you want (trigger) to smoke, such as: ? Bars. ? Parties. ? Smoke-break areas at work.  Spend time with people who do not smoke.  Lower the stress in your life. Stress can make you want to smoke. Try  these things to help your stress: ? Getting regular exercise. ? Deep-breathing exercises. ? Yoga. ? Meditating. ? Doing a body scan. To do this, close your eyes, focus on one area of your body at a time from head to toe, and notice which parts of your body are tense. Try to relax the muscles in those areas.  Download or buy apps on your mobile phone or tablet that can help you stick to your quit plan. There are many free apps, such as QuitGuide from the [employer]  Office manager for Disease Control and Prevention). You can find more support from smokefree.gov and other websites.  This information is not intended to replace advice given to you by your health care provider. Make sure you discuss any questions you have with your health care provider. Document Released: 01/21/2009 Document Revised: 11/23/2015 Document Reviewed: 08/11/2014 Elsevier Interactive Patient Education  [employer].    If you need a refill on your cardiac medications before your next appointment, please call your pharmacy.   Echocardiogram An echocardiogram, or echocardiography, uses sound waves (ultrasound) to produce an image of your heart. The echocardiogram is simple, painless, obtained within a short period of time, and offers valuable information to your health care provider. The images from an echocardiogram can provide information such as:  Evidence of coronary artery disease (CAD).  Heart size.  Heart muscle function.  Heart valve function.  Aneurysm detection.  Evidence of a past heart attack.  Fluid buildup around the heart.  Heart muscle thickening.  Assess heart valve function.  Tell a health care provider about:  Any allergies you have.  All medicines you are taking, including vitamins, herbs, eye drops, creams, and over-the-counter medicines.  Any problems you or family members have had with anesthetic medicines.  Any blood disorders you have.  Any surgeries you have had.  Any medical conditions you have.  Whether you are pregnant or may be pregnant. What happens before the procedure? No special preparation is needed. Eat and drink normally. What happens during the procedure?  In order to produce an image of your heart, gel will be applied to your chest and a wand-like tool (transducer) will be moved over your chest. The gel will help transmit the sound waves from the transducer. The sound waves will harmlessly bounce off your heart to  allow the heart images to be captured in real-time motion. These images will then be recorded.  You may need an IV to receive a medicine that improves the quality of the pictures. What happens after the procedure? You may return to your normal schedule including diet, activities, and medicines, unless your health care provider tells you otherwise. This information is not intended to replace advice given to you by your health care provider. Make sure you discuss any questions you have with your health care provider. Document Released: 03/24/2000 Document Revised: 11/13/2015 Document Reviewed: 12/02/2012 Elsevier Interactive Patient Education  2017 [employer].

## 2017-02-23 NOTE — Progress Notes (Signed)
Cardiology Office Note   Date:  02/23/2017   ID:  Kathy [name], DOB June 24, 1965, MRN [id number]  PCP:  System, Pcp Not In  Cardiologist:   [name], MD   Chief Complaint  Patient presents with  . OTHER    Chest pain using nitro c/o heart palpitations. Meds reviewed verbally with pt.      History of Present Illness: Kathy [name] is a 51 y.o. female who [facility].  The patient has known history of recurrent chest pain usually responsive to nitroglycerin which is thought to be due to coronary spasm or endothelial dysfunction.  She had cardiac catheterization done 4 times in the past in 2007, 2011, 2012 and most recently in December 2017 at [facility].  I performed her catheterization in 2011.  All these showed mild nonobstructive coronary artery disease.  She is known to have a rate dependent left bundle branch block.  She is allergic to aspirin.  Other medical problems include tobacco use and hyperlipidemia.  She has family history of coronary artery disease. Her chest pain worsened recently when she was out of Imdur and she was taking sublingual nitroglycerin very frequently.  The symptoms improved with resumption of Imdur although she continues to use sublingual nitroglycerin.  She is trying to quit smoking and down to 5 cigarettes a day.    Past Medical History:  Diagnosis Date  . Asthma   . CHF (congestive heart failure) ([place])   . Coronary artery disease   . Heart murmur   . Hyperlipidemia   . Hypertension   . Myocardial infarction ([place])   . Obesity   . Seizures ([place])     Past Surgical History:  Procedure Laterality Date  . ANTERIOR CRUCIATE LIGAMENT REPAIR    . BACK SURGERY     L4 and L5  . CARDIAC CATHETERIZATION  04/09/2016  . TONSILLECTOMY AND ADENOIDECTOMY  2008  . TUBAL LIGATION       Current Outpatient Medications  Medication Sig Dispense Refill  . clopidogrel (PLAVIX) 75 MG tablet Take 1 tablet  (75 mg total) by mouth daily. 90 tablet 3  . furosemide (LASIX) 20 MG tablet Take 1 tablet (20 mg total) by mouth daily. 90 tablet 3  . isosorbide mononitrate (IMDUR) 60 MG 24 hr tablet 1 tablet  twice daily 60 tablet 3  . meclizine (ANTIVERT) 25 MG tablet Take 1 tablet (25 mg total) by mouth 3 (three) times daily as needed for dizziness. 90 tablet 6  . metoprolol tartrate (LOPRESSOR) 25 MG tablet Take 1 tablet (25 mg total) by mouth 2 (two) times daily. 60 tablet 6  . Multiple Vitamin (MULTIVITAMIN) tablet Take 1 tablet daily by mouth.    . Multiple Vitamins-Minerals (HAIR SKIN AND NAILS FORMULA PO) Take daily by mouth.    . nitroGLYCERIN (NITROSTAT) 0.4 MG SL tablet Place 1 tablet (0.4 mg total) under the tongue every 5 (five) minutes as needed for chest pain. 30 tablet 6  . pantoprazole (PROTONIX) 40 MG tablet Take 1 tablet (40 mg total) by mouth daily. 90 tablet 3  . potassium chloride SA (K-DUR,KLOR-CON) 20 MEQ tablet Take 1 tablet (20 mEq total) by mouth daily. 90 tablet 1  . simvastatin (ZOCOR) 20 MG tablet Take 1 tablet (20 mg total) by mouth daily. 90 tablet 3   Current Facility-Administered Medications  Medication Dose Route Frequency Provider Last Rate Last Dose  . EPINEPHrine (EPI-PEN) injection 0.3 mg  0.3 mg Intramuscular Once PRN Hugelmeyer, Alexis, DO        Allergies:   Aspirin; Celebrex [celecoxib]; Fentanyl; Lisinopril; Percocet [oxycodone-acetaminophen]; Simvastatin-high dose; Skelaxin [metaxalone]; Morphine; Vicodin [hydrocodone-acetaminophen]; and Albuterol    Social History:  The patient  reports that she has been smoking cigarettes.  She has a 10.00 pack-year smoking history. she has never used smokeless tobacco. She reports that she does not drink alcohol or use drugs.   Family History:  The patient's family history includes Asthma in her daughter and son; COPD in her sister; Diabetes in her mother and sister; Heart attack in her father and mother; Kidney disease in her  sister; Stroke in her father, mother, and sister.    ROS:  Please see the history of present illness.   Otherwise, review of systems are positive for none.   All other systems are reviewed and negative.    PHYSICAL EXAM: VS:  BP 110/66 (BP Location: Right Arm, Patient Position: Sitting, Cuff Size: Large)   Pulse 66   Ht 5\' 7"  (1.702 m)   Wt 256 lb 8 oz (116.3 kg)   BMI 40.17 kg/m  , BMI Body mass index is 40.17 kg/m. GEN: Well nourished, well developed, in no acute distress  HEENT: normal  Neck: no JVD, carotid bruits, or masses Cardiac: RRR; no  rubs, or gallops,no edema .  2 out of 6 systolic murmur in the aortic area Respiratory:  clear to auscultation bilaterally, normal work of breathing GI: soft, nontender, nondistended, + BS MS: no deformity or atrophy  Skin: warm and dry, no rash Neuro:  Strength and sensation are intact Psych: euthymic mood, full affect   EKG:  EKG is ordered today. The ekg ordered today demonstrates normal sinus rhythm with no significant ST or T wave changes.   Recent Labs: 04/09/2016: Hemoglobin 13.5; Platelets 226 01/09/2017: ALT 14; BUN 11; Creatinine, Ser 0.86; Potassium 4.5; Sodium 139    Lipid Panel    Component Value Date/Time   CHOL 154 01/09/2017 1810   TRIG 194 (H) 01/09/2017 1810   HDL 26 (L) 01/09/2017 1810   CHOLHDL 5.9 (H) 01/09/2017 1810   CHOLHDL 5.7 02/24/2016 0618   VLDL 30 02/24/2016 0618   LDLCALC 89 01/09/2017 1810      Wt Readings from Last 3 Encounters:  02/23/17 256 lb 8 oz (116.3 kg)  01/11/17 256 lb 12.8 oz (116.5 kg)  10/21/16 256 lb (116.1 kg)       PAD Screen 02/23/2017  Previous PAD dx? No  Previous surgical procedure? No  Pain with walking? Yes  Subsides with rest? No  Feet/toe relief with dangling? No  Painful, non-healing ulcers? No  Extremities discolored? No      ASSESSMENT AND PLAN:  1.  Mild coronary artery disease with other forms of angina: The patient had cardiac catheterization  multiple times in the past which documented no obstructive coronary artery disease.  She has chronic angina thought to be due to coronary spasm or endothelial dysfunction.  Continue medical therapy.  Not able to increase her antianginal medications given that her blood pressure is somewhat on the low side. I discussed with her that the most important step in controlling her symptoms is smoking cessation.  2.  Cardiac murmur: I requested an echocardiogram for evaluation.  3.  Hyperlipidemia: Continue treatment with simvastatin.  4.  Tobacco use: I discussed with her the importance of smoking cessation.    Disposition:   FU with me in 6 months  Signed,  [name], MD  02/23/2017 3:39 PM    [place]

## 2017-02-27 ENCOUNTER — Other Ambulatory Visit: Payer: Self-pay

## 2017-02-27 ENCOUNTER — Other Ambulatory Visit: Payer: Self-pay | Admitting: Cardiovascular Disease

## 2017-02-27 ENCOUNTER — Ambulatory Visit (INDEPENDENT_AMBULATORY_CARE_PROVIDER_SITE_OTHER): Payer: Self-pay

## 2017-02-27 DIAGNOSIS — R011 Cardiac murmur, unspecified: Secondary | ICD-10-CM

## 2017-03-14 ENCOUNTER — Other Ambulatory Visit: Payer: Self-pay | Admitting: Internal Medicine

## 2017-03-14 DIAGNOSIS — I152 Hypertension secondary to endocrine disorders: Secondary | ICD-10-CM

## 2017-03-14 DIAGNOSIS — E1159 Type 2 diabetes mellitus with other circulatory complications: Secondary | ICD-10-CM

## 2017-03-14 DIAGNOSIS — I10 Essential (primary) hypertension: Principal | ICD-10-CM

## 2017-04-09 ENCOUNTER — Ambulatory Visit: Payer: Self-pay | Admitting: Cardiovascular Disease

## 2017-04-17 ENCOUNTER — Other Ambulatory Visit: Payer: Self-pay | Admitting: Internal Medicine

## 2017-04-17 DIAGNOSIS — I10 Essential (primary) hypertension: Principal | ICD-10-CM

## 2017-04-17 DIAGNOSIS — E1159 Type 2 diabetes mellitus with other circulatory complications: Secondary | ICD-10-CM

## 2017-04-17 DIAGNOSIS — I152 Hypertension secondary to endocrine disorders: Secondary | ICD-10-CM

## 2017-04-19 ENCOUNTER — Ambulatory Visit: Payer: Self-pay | Admitting: Urology

## 2017-04-19 VITALS — BP 154/88 | HR 87 | Wt 265.3 lb

## 2017-04-19 DIAGNOSIS — E1159 Type 2 diabetes mellitus with other circulatory complications: Secondary | ICD-10-CM

## 2017-04-19 DIAGNOSIS — I10 Essential (primary) hypertension: Secondary | ICD-10-CM

## 2017-04-19 DIAGNOSIS — I25119 Atherosclerotic heart disease of native coronary artery with unspecified angina pectoris: Secondary | ICD-10-CM

## 2017-04-19 MED ORDER — NITROGLYCERIN 0.4 MG SL SUBL: 0.4000 mg | SUBLINGUAL_TABLET | SUBLINGUAL | 6 refills | Status: DC | PRN

## 2017-04-19 MED ORDER — METOPROLOL TARTRATE 25 MG PO TABS: 25.0000 mg | ORAL_TABLET | Freq: Two times a day (BID) | ORAL | 3 refills | Status: DC

## 2017-04-19 MED ORDER — ISOSORBIDE MONONITRATE ER 60 MG PO TB24: ORAL_TABLET | ORAL | 3 refills | Status: DC

## 2017-04-19 MED ORDER — PANTOPRAZOLE SODIUM 40 MG PO TBEC: 40.0000 mg | DELAYED_RELEASE_TABLET | Freq: Every day | ORAL | 3 refills | Status: DC

## 2017-04-19 MED ORDER — SIMVASTATIN 20 MG PO TABS: 20.0000 mg | ORAL_TABLET | Freq: Every day | ORAL | 3 refills | Status: DC

## 2017-04-19 MED ORDER — POTASSIUM CHLORIDE CRYS ER 20 MEQ PO TBCR: 20.0000 meq | EXTENDED_RELEASE_TABLET | Freq: Every day | ORAL | 3 refills | Status: DC

## 2017-04-19 MED ORDER — FUROSEMIDE 20 MG PO TABS: 20.0000 mg | ORAL_TABLET | Freq: Every day | ORAL | 3 refills | Status: DC

## 2017-04-19 MED ORDER — CLOPIDOGREL BISULFATE 75 MG PO TABS: 75.0000 mg | ORAL_TABLET | Freq: Every day | ORAL | 3 refills | Status: DC

## 2017-04-19 NOTE — Progress Notes (Signed)
Patient: Kathy [name] Female    DOB: 1965-07-27   52 y.o.   MRN: [id number] Visit Date: 04/19/2017  Today's Provider: Zara Council, PA-C   Chief Complaint  Patient presents with  . Follow-up    irregular heart beat   Subjective:    HPI Continues to have episodes of chest pain associated with black outs.  Has had four negative cardiac caths.  Evaluated by Dr. Fletcher Anon.  Recommend to stop smoking - patient is down to less than 5 cigarettes a day and is using the nicotine patch.  Angina most likely due o coronary spasms.  Trivial regurgitation seen in mitral and tricuspid valves on echo.  EF 55% to 60%    Allergies  Allergen Reactions  . Aspirin Anaphylaxis  . Celebrex [Celecoxib] Anaphylaxis and Hives  . Fentanyl Anaphylaxis  . Lisinopril Shortness Of Breath  . Percocet [Oxycodone-Acetaminophen] Shortness Of Breath  . Simvastatin-High Dose Hives and Shortness Of Breath    Can tolerate 20 mg dose.   Jonah Blue [Metaxalone] Anaphylaxis  . Morphine Swelling  . Vicodin [Hydrocodone-Acetaminophen] Hives and Other (See Comments)    Feels like her tongue swells  . Albuterol Palpitations   Previous Medications   CLOPIDOGREL (PLAVIX) 75 MG TABLET    Take 1 tablet (75 mg total) by mouth daily.   FUROSEMIDE (LASIX) 20 MG TABLET    Take 1 tablet (20 mg total) by mouth daily.   ISOSORBIDE MONONITRATE (IMDUR) 60 MG 24 HR TABLET    TAKE ONE TABLET BY MOUTH 2 TIMES A DAY   MECLIZINE (ANTIVERT) 25 MG TABLET    Take 1 tablet (25 mg total) by mouth 3 (three) times daily as needed for dizziness.   METOPROLOL TARTRATE (LOPRESSOR) 25 MG TABLET    Take 1 tablet (25 mg total) by mouth 2 (two) times daily.   MULTIPLE VITAMIN (MULTIVITAMIN) TABLET    Take 1 tablet daily by mouth.   MULTIPLE VITAMINS-MINERALS (HAIR SKIN AND NAILS FORMULA PO)    Take daily by mouth.   NITROGLYCERIN (NITROSTAT) 0.4 MG SL TABLET    Place 1 tablet (0.4 mg total) under the tongue every 5 (five) minutes as needed for chest pain.    PANTOPRAZOLE (PROTONIX) 40 MG TABLET    Take 1 tablet (40 mg total) by mouth daily.   POTASSIUM CHLORIDE SA (K-DUR,KLOR-CON) 20 MEQ TABLET    Take 1 tablet (20 mEq total) by mouth daily.   SIMVASTATIN (ZOCOR) 20 MG TABLET    Take 1 tablet (20 mg total) by mouth daily.    Review of Systems  Constitutional: Negative.   HENT: Negative.   Eyes: Negative.   Respiratory: Negative.   Cardiovascular: Positive for chest pain.  Gastrointestinal: Negative.   Endocrine: Negative.   Genitourinary: Negative.   Musculoskeletal: Negative.   Skin: Negative.   Neurological: Negative.   Hematological: Negative.   Psychiatric/Behavioral: Negative.     Social History   Tobacco Use  . Smoking status: Current Every Day Smoker    Packs/day: 0.20    Years: 20.00    Pack years: 4.00    Types: Cigarettes  . Smokeless tobacco: Never Used  . Tobacco comment: Decreased smoking to less than 5 cigs/day  Substance Use Topics  . Alcohol use: No   Objective:   BP (!) 154/88 (BP Location: Left Arm, Patient Position: Sitting, Cuff Size: Large)   Pulse 87   Wt 265 lb 4.8 oz (120.3 kg)   LMP 03/18/2017 (Exact Date)   BMI  41.55 kg/m   Physical Exam Constitutional: Well nourished. Alert and oriented, No acute distress. HEENT: [place] AT, moist mucus membranes. Trachea midline, no masses. Cardiovascular: No clubbing, cyanosis, or edema. Respiratory: Normal respiratory effort, no increased work of breathing. GI: Obese, abdomen is soft, non tender, non distended, no abdominal masses.  GU: No CVA tenderness.  No bladder fullness or masses.   Skin: No rashes, bruises or suspicious lesions. Lymph: No cervical or inguinal adenopathy. Neurologic: Grossly intact, no focal deficits, moving all 4 extremities. Psychiatric: Normal mood and affect.      Assessment & Plan:  1. Mild CAD  - continue clopidogrel, furosemide, isosorbide mononitrate, metoprolol and nitroglycerin  2. HTN  - patient took a couple of  hits of a cigarette  - normal BP at Dr. Tyrell Antonio office  - check CMP, CBC and TSH  3. Hypercholesterolemia  - continue   - check lipids   4. Pre-diabetic  - recheck HbgA1c  5. Tobacco dependence  - encouraged to continue to quit smoking    RTC in 3 months for labs and office visit  Zara Council, PA-C   [facility]

## 2017-04-20 LAB — COMPREHENSIVE METABOLIC PANEL
ALK PHOS: 92 IU/L (ref 39–117)
ALT: 37 IU/L — AB (ref 0–32)
AST: 20 IU/L (ref 0–40)
Albumin/Globulin Ratio: 1.5 (ref 1.2–2.2)
Albumin: 4.1 g/dL (ref 3.5–5.5)
BUN/Creatinine Ratio: 17 (ref 9–23)
BUN: 12 mg/dL (ref 6–24)
CHLORIDE: 102 mmol/L (ref 96–106)
CO2: 26 mmol/L (ref 20–29)
Calcium: 9.2 mg/dL (ref 8.7–10.2)
Creatinine, Ser: 0.72 mg/dL (ref 0.57–1.00)
GFR calc Af Amer: 113 mL/min/{1.73_m2} (ref >59)
GFR calc non Af Amer: 98 mL/min/{1.73_m2} (ref >59)
GLUCOSE: 104 mg/dL — AB (ref 65–99)
Globulin, Total: 2.8 g/dL (ref 1.5–4.5)
Potassium: 4.2 mmol/L (ref 3.5–5.2)
Sodium: 141 mmol/L (ref 134–144)
Total Protein: 6.9 g/dL (ref 6.0–8.5)

## 2017-04-20 LAB — CBC WITH DIFFERENTIAL/PLATELET
BASOS ABS: 0 10*3/uL (ref 0.0–0.2)
Basos: 0 %
EOS (ABSOLUTE): 0.3 10*3/uL (ref 0.0–0.4)
Eos: 3 %
Hematocrit: 42.1 % (ref 34.0–46.6)
Hemoglobin: 14.2 g/dL (ref 11.1–15.9)
IMMATURE GRANS (ABS): 0 10*3/uL (ref 0.0–0.1)
Immature Granulocytes: 0 %
LYMPHS: 32 %
Lymphocytes Absolute: 2.9 10*3/uL (ref 0.7–3.1)
MCH: 29.6 pg (ref 26.6–33.0)
MCHC: 33.7 g/dL (ref 31.5–35.7)
MCV: 88 fL (ref 79–97)
Monocytes Absolute: 0.5 10*3/uL (ref 0.1–0.9)
Monocytes: 6 %
NEUTROS PCT: 59 %
Neutrophils Absolute: 5.4 10*3/uL (ref 1.4–7.0)
PLATELETS: 256 10*3/uL (ref 150–379)
RBC: 4.8 x10E6/uL (ref 3.77–5.28)
RDW: 14.8 % (ref 12.3–15.4)
WBC: 9.1 10*3/uL (ref 3.4–10.8)

## 2017-04-20 LAB — LIPID PANEL
CHOLESTEROL TOTAL: 137 mg/dL (ref 100–199)
Chol/HDL Ratio: 4.9 ratio — ABNORMAL HIGH (ref 0.0–4.4)
HDL: 28 mg/dL — AB (ref >39)
LDL Calculated: 67 mg/dL (ref 0–99)
Triglycerides: 209 mg/dL — ABNORMAL HIGH (ref 0–149)
VLDL Cholesterol Cal: 42 mg/dL — ABNORMAL HIGH (ref 5–40)

## 2017-04-20 LAB — TSH: TSH: 1 u[IU]/mL (ref 0.450–4.500)

## 2017-06-04 ENCOUNTER — Telehealth: Payer: Self-pay | Admitting: Pharmacy Technician

## 2017-06-04 NOTE — Telephone Encounter (Signed)
Received updated proof of income.  Patient eligible to receive medication assistance at [facility] through 2019, as long as eligibility requirements continue to be met.  [facility]

## 2017-06-11 ENCOUNTER — Ambulatory Visit: Payer: Self-pay

## 2017-06-11 ENCOUNTER — Encounter (INDEPENDENT_AMBULATORY_CARE_PROVIDER_SITE_OTHER): Payer: Self-pay

## 2017-06-11 ENCOUNTER — Other Ambulatory Visit: Payer: Self-pay

## 2017-06-11 VITALS — BP 136/78 | Ht 67.0 in | Wt 272.0 lb

## 2017-06-11 DIAGNOSIS — Z79899 Other long term (current) drug therapy: Secondary | ICD-10-CM

## 2017-06-11 NOTE — Progress Notes (Signed)
[facility] Visit Note  Patient: Kathy [name] MRN: [id number] Date of Birth: February 06, 1966 PCP: System, Pcp Not In   Kathy [name] 52 y.o. female presents for a medication therapy management visit today with the pharmacist.  BP 136/78 (BP Location: Right Arm)   Ht 5\' 7"  (1.702 m)   Wt 272 lb (123.4 kg)   BMI 42.60 kg/m   Patient Information   Past Medical History:  Diagnosis Date  . Asthma   . CHF (congestive heart failure) ([place])   . Coronary artery disease   . Heart murmur   . Hyperlipidemia   . Hypertension   . Myocardial infarction ([place])   . Obesity   . Seizures ([place])       Past Surgical History:  Procedure Laterality Date  . ANTERIOR CRUCIATE LIGAMENT REPAIR    . BACK SURGERY     L4 and L5  . CARDIAC CATHETERIZATION  04/09/2016  . TONSILLECTOMY AND ADENOIDECTOMY  2008  . TUBAL LIGATION       Family History  Problem Relation Age of Onset  . Kidney disease Sister   . Diabetes Sister   . COPD Sister   . Stroke Sister   . Diabetes Mother   . Stroke Mother   . Heart attack Mother   . Stroke Father   . Heart attack Father   . Asthma Daughter   . Asthma Son     Family Support: Good; has fiance and kids   Lifestyle Diet: 1-3 meals  Lunch: tuna or chicken salad with fat free sour cream,  Dinner: baked chicken, potatoes, rice, greens/brocolli  Drinks: water  Snacks: almonds, pistachios   Exercise: walking around house/[employer]   Current Exercise Habits: Home exercise routine, Type of exercise: walking, Frequency (Times/Week): 3       Social History   Substance and Sexual Activity  Alcohol Use No      Social History   Tobacco Use  Smoking Status Current Every Day Smoker  . Packs/day: 0.20  . Years: 20.00  . Pack years: 4.00  . Types: Cigarettes  Smokeless Tobacco Never Used  Tobacco Comment   Decreased smoking to less than 5 cigs/day      Health Maintenance  Topic Date Due  . PNEUMOCOCCAL POLYSACCHARIDE VACCINE (1)  05/31/1968  . FOOT EXAM  05/31/1976  . OPHTHALMOLOGY EXAM  05/31/1976  . URINE MICROALBUMIN  05/31/1976  . HIV Screening  05/31/1981  . TETANUS/TDAP  05/31/1985  . PAP SMEAR  06/01/1987  . MAMMOGRAM  05/31/2016  . COLONOSCOPY  05/31/2016  . INFLUENZA VACCINE  11/08/2016  . HEMOGLOBIN A1C  12/21/2016    Health Maintenance/Date Completed  Last ED visit: 2017 - because of chest pain - heart cath Last Visit to PCP: [facility] for PCP; Dr. Fletcher Anon - Cardiology Next Visit to PCP: yes, has follow up in 3-6 months  Specialist Visit: Cardiology - Dr. Fletcher Anon- 6 months  Dental Exam: 04/2016  Eye Exam: 2018 Pelvic/PAP Exam: 2017/2016  Mammogram: 2017/2016 Colonoscopy: 2014 Flu Vaccine: 01/2017 Pneumonia Vaccine: N/A    Assessment and Plan:  Chronic angina/chest pain/HF: clopidogrel, lasix, imdur, nitroglycerin, simvastatin: The patient states that she has not had any issues recently. She has been told that she has chronic angina and a heart murmur. She is currently followed by Dr. Fletcher Anon. She states that the last time she used her ntiroglycerin was in February (2 episodes) but that she hasn't been to the hospital since 2017. Her BP was slightly elevated today at  136/78. She states that it is not normally this high. Her last lipid panel on 04/2017 showed a TC of 137, HDL of 28, and LDL of 67. I recommended continuing diet and trying to exercise more as tolerated given chronic angina. No changes recommended at this time.   Prediabetes: The patient is not on any anti-diabetic medications at this time. Her last A1c was 5.7 on 06/2016. I would recommend a follow up A1c and told her to continue diet and exercise.   GERD: pantoprazole; She states that her acid reflux is well controlled.   Dizziness: meclizine; She states that she has not had to use this and has not had any issues with dizziness.   Smoking cessation: Patient is not on any medications at this time. She states that she was previously on the nicotine  patch and really liked it. She says that she is ready to quit smoking and that she only smokes 2-5 cigarettes a day. I encouraged her to ask [facility] for a new prescription for nicotine patches to see if this will help her quit.   Ms. Hershkowitz presented for her MTM appointment with me today. She is doing well and does not require any medication adjustments at this time. I scheduled a 6 month follow up appointment with her.   Lendon Ka, PharmD Pharmacy Resident

## 2017-10-04 ENCOUNTER — Ambulatory Visit: Payer: Self-pay | Admitting: Family Medicine

## 2017-10-04 VITALS — BP 132/83 | HR 83 | Temp 98.3°F | Ht 64.8 in | Wt 276.6 lb

## 2017-10-04 DIAGNOSIS — R569 Unspecified convulsions: Secondary | ICD-10-CM

## 2017-10-04 DIAGNOSIS — R079 Chest pain, unspecified: Secondary | ICD-10-CM

## 2017-10-04 DIAGNOSIS — I25119 Atherosclerotic heart disease of native coronary artery with unspecified angina pectoris: Secondary | ICD-10-CM

## 2017-10-04 DIAGNOSIS — I10 Essential (primary) hypertension: Secondary | ICD-10-CM

## 2017-10-04 DIAGNOSIS — E782 Mixed hyperlipidemia: Secondary | ICD-10-CM

## 2017-10-04 DIAGNOSIS — L309 Dermatitis, unspecified: Secondary | ICD-10-CM

## 2017-10-04 DIAGNOSIS — Z720 Tobacco use: Secondary | ICD-10-CM

## 2017-10-04 DIAGNOSIS — D72820 Lymphocytosis (symptomatic): Secondary | ICD-10-CM

## 2017-10-04 DIAGNOSIS — L231 Allergic contact dermatitis due to adhesives: Secondary | ICD-10-CM

## 2017-10-04 DIAGNOSIS — R55 Syncope and collapse: Secondary | ICD-10-CM

## 2017-10-04 DIAGNOSIS — R7303 Prediabetes: Secondary | ICD-10-CM

## 2017-10-04 DIAGNOSIS — E785 Hyperlipidemia, unspecified: Secondary | ICD-10-CM

## 2017-10-04 MED ORDER — TRIAMCINOLONE ACETONIDE 0.5 % EX OINT
1.0000 | TOPICAL_OINTMENT | Freq: Two times a day (BID) | CUTANEOUS | 2 refills | Status: DC
Start: 2017-10-04 — End: 2018-02-12

## 2017-10-04 NOTE — Assessment & Plan Note (Signed)
Rechecking A1c today. Await results. Call with any concerns.  

## 2017-10-04 NOTE — Assessment & Plan Note (Signed)
Rechecking levels today. Await results. Call with any concerns.  

## 2017-10-04 NOTE — Assessment & Plan Note (Signed)
Taking nitro 2-3x a day on top of her imdur. Having CP and SOB. Passing out (unclear etiology) 1-2x a day. Overdue for appointment with cardiology. Will get her back into see them. Call with any concerns. Monitor closely.

## 2017-10-04 NOTE — Assessment & Plan Note (Signed)
No cigarettes since beginning of May! Congratulated patient on quitting.

## 2017-10-04 NOTE — Assessment & Plan Note (Signed)
Unclear if she is having syncopal events or seizures. Not on any medicine. Happening 1-2x a day. Will get her back into cardiology and into neurology for evaluation. Checking labs today. Await results.

## 2017-10-04 NOTE — Progress Notes (Signed)
BP 132/83   Pulse 83   Temp 98.3 F (36.8 C)   Ht 5' 4.8" (1.646 m)   Wt 276 lb 9.6 oz (125.5 kg)   BMI 46.31 kg/m    Subjective:    Patient ID: Kathy [name], female    DOB: [birth year], 52 y.o.   MRN: [id number]  HPI: Kathy [name] is a 52 y.o. female  Chief Complaint  Patient presents with  . Follow-up    Needs med refills   Quit smoking!  HYPERTENSION / HYPERLIPIDEMIA Satisfied with current treatment? yes Duration of hypertension: chronic BP monitoring frequency: not checking BP medication side effects: no Past BP meds: metoprolol, lasix, imdur Duration of hyperlipidemia: chronic Cholesterol medication side effects: no Cholesterol supplements: none Past cholesterol medications: simvastatin Medication compliance: excellent compliance Aspirin: no Recent stressors: no Recurrent headaches: no Visual changes: no Palpitations: yes Dyspnea: yes Chest pain: yes- stabbing pain, happening about 1-2x a day, this is chronic, takes nitro. Taking nitro at least 2-3x day Lower extremity edema: no Dizzy/lightheaded: yes  Syncope: Passing out 1-2x a day- unclear if this is a seizure of syncope  Hasn't had any medicine for her seizures- states that her meclizine was her medicine for her seizures. Hasn't seen neurology in years. Last had a seizure that she remembers in 2017. Last full blown seizure was in 2015-2016.  Impaired Fasting Glucose HbA1C:  Lab Results  Component Value Date   HGBA1C 5.7 (H) 06/20/2016   Duration of elevated blood sugar: chronic Polydipsia: yes Polyuria: no Weight change: yes Visual disturbance: no Glucose Monitoring: no    Relevant past medical, surgical, family and social history reviewed and updated as indicated. Interim medical history since our last visit reviewed. Allergies and medications reviewed and updated.  Review of Systems  Constitutional: Positive for fatigue. Negative for activity change, appetite change, chills, diaphoresis,  fever and unexpected weight change.  HENT: Negative.   Respiratory: Positive for shortness of breath. Negative for apnea, cough, choking, chest tightness, wheezing and stridor.   Cardiovascular: Positive for chest pain and palpitations. Negative for leg swelling.  Gastrointestinal: Negative.   Genitourinary: Negative.   Musculoskeletal: Negative.   Skin: Positive for rash. Negative for color change, pallor and wound.  Neurological: Positive for dizziness, seizures and syncope. Negative for tremors, facial asymmetry, speech difficulty, weakness, light-headedness, numbness and headaches.  Psychiatric/Behavioral: Negative.     Per HPI unless specifically indicated above     Objective:    BP 132/83   Pulse 83   Temp 98.3 F (36.8 C)   Ht 5' 4.8" (1.646 m)   Wt 276 lb 9.6 oz (125.5 kg)   BMI 46.31 kg/m   Wt Readings from Last 3 Encounters:  10/04/17 276 lb 9.6 oz (125.5 kg)  06/11/17 272 lb (123.4 kg)  04/19/17 265 lb 4.8 oz (120.3 kg)    Physical Exam  Constitutional: She is oriented to person, place, and time. She appears well-developed and well-nourished. No distress.  HENT:  Head: Normocephalic and atraumatic.  Right Ear: Hearing normal.  Left Ear: Hearing normal.  Nose: Nose normal.  Eyes: Conjunctivae and lids are normal. Right eye exhibits no discharge. Left eye exhibits no discharge. No scleral icterus.  Cardiovascular: Normal rate, regular rhythm, normal heart sounds and intact distal pulses. Exam reveals no gallop and no friction rub.  No murmur heard. Pulmonary/Chest: Effort normal and breath sounds normal. No stridor. No respiratory distress. She has no wheezes. She has no rales. She exhibits no tenderness.  Musculoskeletal: Normal range of motion.  Neurological: She is alert and oriented to person, place, and time.  Skin: Skin is warm, dry and intact. Capillary refill takes less than 2 seconds. Rash noted. She is not diaphoretic. No erythema. No pallor.  Eczema on  R hand and L foot Contact dermatitis on chest and abdomen.   Psychiatric: She has a normal mood and affect. Her speech is normal and behavior is normal. Judgment and thought content normal. Cognition and memory are normal.  Nursing note and vitals reviewed.   Results for orders placed or performed in visit on 04/19/17  CBC with Differential/Platelet  Result Value Ref Range   WBC 9.1 3.4 - 10.8 x10E3/uL   RBC 4.80 3.77 - 5.28 x10E6/uL   Hemoglobin 14.2 11.1 - 15.9 g/dL   Hematocrit 42.1 34.0 - 46.6 %   MCV 88 79 - 97 fL   MCH 29.6 26.6 - 33.0 pg   MCHC 33.7 31.5 - 35.7 g/dL   RDW 14.8 12.3 - 15.4 %   Platelets 256 150 - 379 x10E3/uL   Neutrophils 59 Not Estab. %   Lymphs 32 Not Estab. %   Monocytes 6 Not Estab. %   Eos 3 Not Estab. %   Basos 0 Not Estab. %   Neutrophils Absolute 5.4 1.4 - 7.0 x10E3/uL   Lymphocytes Absolute 2.9 0.7 - 3.1 x10E3/uL   Monocytes Absolute 0.5 0.1 - 0.9 x10E3/uL   EOS (ABSOLUTE) 0.3 0.0 - 0.4 x10E3/uL   Basophils Absolute 0.0 0.0 - 0.2 x10E3/uL   Immature Granulocytes 0 Not Estab. %   Immature Grans (Abs) 0.0 0.0 - 0.1 x10E3/uL  Comprehensive metabolic panel  Result Value Ref Range   Glucose 104 (H) 65 - 99 mg/dL   BUN 12 6 - 24 mg/dL   Creatinine, Ser 0.72 0.57 - 1.00 mg/dL   GFR calc non Af Amer 98 >59 mL/min/1.73   GFR calc Af Amer 113 >59 mL/min/1.73   BUN/Creatinine Ratio 17 9 - 23   Sodium 141 134 - 144 mmol/L   Potassium 4.2 3.5 - 5.2 mmol/L   Chloride 102 96 - 106 mmol/L   CO2 26 20 - 29 mmol/L   Calcium 9.2 8.7 - 10.2 mg/dL   Total Protein 6.9 6.0 - 8.5 g/dL   Albumin 4.1 3.5 - 5.5 g/dL   Globulin, Total 2.8 1.5 - 4.5 g/dL   Albumin/Globulin Ratio 1.5 1.2 - 2.2   Bilirubin Total <0.2 0.0 - 1.2 mg/dL   Alkaline Phosphatase 92 39 - 117 IU/L   AST 20 0 - 40 IU/L   ALT 37 (H) 0 - 32 IU/L  Lipid panel  Result Value Ref Range   Cholesterol, Total 137 100 - 199 mg/dL   Triglycerides 209 (H) 0 - 149 mg/dL   HDL 28 (L) >39 mg/dL    VLDL Cholesterol Cal 42 (H) 5 - 40 mg/dL   LDL Calculated 67 0 - 99 mg/dL   Chol/HDL Ratio 4.9 (H) 0.0 - 4.4 ratio  TSH  Result Value Ref Range   TSH 1.000 0.450 - 4.500 uIU/mL      Assessment & Plan:   Problem List Items Addressed This Visit      Cardiovascular and Mediastinum   Hypertension - Primary    Under good control today. Continue current regimen. Continue to monitor. Call with any concerns. Refills up to date.       Relevant Orders   CBC with Differential/Platelet   Comprehensive metabolic panel  TSH   CAD (coronary artery disease)    Taking nitro 2-3x a day on top of her imdur. Having CP and SOB. Passing out (unclear etiology) 1-2x a day. Overdue for appointment with cardiology. Will get her back into see them. Call with any concerns. Monitor closely.      Relevant Orders   CBC with Differential/Platelet   Comprehensive metabolic panel   TSH     Other   Elevated lymphocytes    Rechecking levels today. Await results. Call with any concerns.       Relevant Orders   CBC with Differential/Platelet   Comprehensive metabolic panel   TSH   Prediabetes    Rechecking A1c today. Await results. Call with any concerns.       Relevant Orders   CBC with Differential/Platelet   Comprehensive metabolic panel   TSH   Tobacco abuse    No cigarettes since beginning of May! Congratulated patient on quitting.       Seizures ([place])    Unclear if she is having syncopal events or seizures. Not on any medicine. Happening 1-2x a day. Will get her back into cardiology and into neurology for evaluation. Checking labs today. Await results.       Relevant Orders   TSH   Ambulatory referral to Neurology   Hyperlipidemia    Under good control today. Continue current regimen. Continue to monitor. Call with any concerns. Refills up to date.       Relevant Orders   CBC with Differential/Platelet   Comprehensive metabolic panel   Lipid Panel w/o Chol/HDL Ratio   TSH   Chest  pain    Taking nitro 2-3x a day on top of her imdur. Having CP and SOB. Passing out (unclear etiology) 1-2x a day. Overdue for appointment with cardiology. Will get her back into see them. Call with any concerns. Monitor closely.      RESOLVED: Dyslipidemia    Other Visit Diagnoses    Syncope, unspecified syncope type       Unclear if syncope/seizures/other etiology. WIll get her back into see cardiology and new referral for neurology. Recheck 1 month. Labs checked today.   Relevant Orders   Ambulatory referral to Cardiology   Eczema, unspecified type       Triamcinalone given. Continue moisturizer. Call with any concerns.   Allergic contact dermatitis due to adhesives       Triamcinalone given. Continue moisturizer. Call with any concerns.       Follow up plan: Return in about 1 month (around 11/01/2017) for follow up.

## 2017-10-04 NOTE — Assessment & Plan Note (Signed)
Under good control today. Continue current regimen. Continue to monitor. Call with any concerns. Refills up to date.

## 2017-10-05 ENCOUNTER — Telehealth: Payer: Self-pay

## 2017-10-05 LAB — CBC WITH DIFFERENTIAL/PLATELET
BASOS ABS: 0 10*3/uL (ref 0.0–0.2)
BASOS: 1 %
EOS (ABSOLUTE): 0.2 10*3/uL (ref 0.0–0.4)
Eos: 3 %
Hematocrit: 41.7 % (ref 34.0–46.6)
Hemoglobin: 14 g/dL (ref 11.1–15.9)
IMMATURE GRANS (ABS): 0 10*3/uL (ref 0.0–0.1)
IMMATURE GRANULOCYTES: 0 %
Lymphocytes Absolute: 2.1 10*3/uL (ref 0.7–3.1)
Lymphs: 29 %
MCH: 28.1 pg (ref 26.6–33.0)
MCHC: 33.6 g/dL (ref 31.5–35.7)
MCV: 84 fL (ref 79–97)
MONOS ABS: 0.6 10*3/uL (ref 0.1–0.9)
Monocytes: 8 %
NEUTROS PCT: 59 %
Neutrophils Absolute: 4.2 10*3/uL (ref 1.4–7.0)
PLATELETS: 256 10*3/uL (ref 150–450)
RBC: 4.99 x10E6/uL (ref 3.77–5.28)
RDW: 15.6 % — AB (ref 12.3–15.4)
WBC: 7.1 10*3/uL (ref 3.4–10.8)

## 2017-10-05 LAB — COMPREHENSIVE METABOLIC PANEL
A/G RATIO: 1.5 (ref 1.2–2.2)
ALT: 26 IU/L (ref 0–32)
AST: 20 IU/L (ref 0–40)
Albumin: 4.2 g/dL (ref 3.5–5.5)
Alkaline Phosphatase: 92 IU/L (ref 39–117)
BUN/Creatinine Ratio: 12 (ref 9–23)
BUN: 10 mg/dL (ref 6–24)
Bilirubin Total: 0.3 mg/dL (ref 0.0–1.2)
CALCIUM: 9.2 mg/dL (ref 8.7–10.2)
CO2: 25 mmol/L (ref 20–29)
Chloride: 104 mmol/L (ref 96–106)
Creatinine, Ser: 0.82 mg/dL (ref 0.57–1.00)
GFR, EST AFRICAN AMERICAN: 96 mL/min/{1.73_m2} (ref >59)
GFR, EST NON AFRICAN AMERICAN: 83 mL/min/{1.73_m2} (ref >59)
GLUCOSE: 106 mg/dL — AB (ref 65–99)
Globulin, Total: 2.8 g/dL (ref 1.5–4.5)
POTASSIUM: 4.9 mmol/L (ref 3.5–5.2)
Sodium: 142 mmol/L (ref 134–144)
TOTAL PROTEIN: 7 g/dL (ref 6.0–8.5)

## 2017-10-05 LAB — LIPID PANEL W/O CHOL/HDL RATIO
Cholesterol, Total: 158 mg/dL (ref 100–199)
HDL: 28 mg/dL — AB (ref >39)
LDL Calculated: 82 mg/dL (ref 0–99)
Triglycerides: 238 mg/dL — ABNORMAL HIGH (ref 0–149)
VLDL Cholesterol Cal: 48 mg/dL — ABNORMAL HIGH (ref 5–40)

## 2017-10-05 LAB — TSH: TSH: 1.77 u[IU]/mL (ref 0.450–4.500)

## 2017-10-05 NOTE — Telephone Encounter (Signed)
Referrals sent to cardio and neurology

## 2017-10-09 ENCOUNTER — Telehealth: Payer: Self-pay

## 2017-10-09 NOTE — Telephone Encounter (Signed)
Pt scheduled with cardio on 8/29 at 420p. They will put her on a cxl list. Pt stated neurology wanted $200 upfront. pts charity care has expired so she will come by the clinic and update her eligibility.

## 2017-10-09 NOTE — Telephone Encounter (Signed)
Per [facility], pt declined to schedule an appt.

## 2017-10-10 ENCOUNTER — Telehealth: Payer: Self-pay

## 2017-10-10 NOTE — Telephone Encounter (Signed)
-----   Message from Azzie Glatter, [place] sent at 10/09/2017  6:07 PM EDT ----- Regarding: "Lab Results" Jonna Coup,  Please call patient and let her know that her labs are stable.   Her Cholesterol has 'mildly' increased. She should continue a Heart Health Diet, and get at least 30 minutes of cardio physical activity on a daily basis. Please remind her to also drink plenty of water.   Thanks!

## 2017-10-10 NOTE — Telephone Encounter (Signed)
Lm for pt to call back re: lab results

## 2017-11-06 ENCOUNTER — Ambulatory Visit: Payer: Self-pay | Admitting: Family Medicine

## 2017-11-06 VITALS — BP 141/92 | HR 79 | Temp 98.1°F | Ht 65.0 in | Wt 275.7 lb

## 2017-11-06 DIAGNOSIS — I10 Essential (primary) hypertension: Secondary | ICD-10-CM

## 2017-11-06 DIAGNOSIS — R079 Chest pain, unspecified: Secondary | ICD-10-CM

## 2017-11-06 DIAGNOSIS — Z720 Tobacco use: Secondary | ICD-10-CM

## 2017-11-06 DIAGNOSIS — E782 Mixed hyperlipidemia: Secondary | ICD-10-CM

## 2017-11-06 DIAGNOSIS — Z09 Encounter for follow-up examination after completed treatment for conditions other than malignant neoplasm: Secondary | ICD-10-CM

## 2017-11-06 NOTE — Progress Notes (Signed)
Follow Up  Subjective:    Patient ID: Kathy [name], female    DOB: 03/15/1966, 52 y.o.   MRN: [id number]   Chief Complaint  Patient presents with  . Follow-up    Lab results  . Coronary Artery Disease    1 episode this morning while sleeping. Has appointment w/ Cardio end of Aug.     HPI Ms. Schauer has a past medical history of Seizures, Obesity, MI, Hypertension, Hyperlipidemia, Heart Murmur, CAD, CHF, and Asthma.  Current Status: Since her last office visit, this morning she had 2 episodes of heart pain. She states that she took Nitroglycerin to stop pain. She states that she gets these attacks often.   She is doing well with no complaints. She denies fevers, chills, fatigue, recent infections, weight loss, and night sweats. She has not had any headaches, visual changes, and falls. No chest pain, heart palpitations, cough and shortness of breath reported. No reports of GI problems such as nausea, vomiting, diarrhea, and constipation. She has no reports of blood in stools, dysuria and hematuria. No depression or anxiety, and denies suicidal ideations, homicidal ideations, or auditory hallucinations. She denies pain today.   Past Medical History:  Diagnosis Date  . Asthma   . CHF (congestive heart failure) ([place])   . Coronary artery disease   . Heart murmur   . Hyperlipidemia   . Hypertension   . Myocardial infarction ([place])   . Obesity   . Seizures ([place])      Family History  Problem Relation Age of Onset  . Kidney disease Sister   . Diabetes Sister   . COPD Sister   . Stroke Sister   . Diabetes Mother   . Stroke Mother   . Heart attack Mother   . Stroke Father   . Heart attack Father   . Asthma Daughter   . Asthma Son     Social History   Socioeconomic History  . Marital status: Divorced    Spouse name: Not on file  . Number of children: Not on file  . Years of education: Not on file  . Highest education level: Not on file  Occupational History  . Not on file   Social Needs  . Financial resource strain: Very hard  . Food insecurity:    Worry: Often true    Inability: Often true  . Transportation needs:    Medical: Yes    Non-medical: Yes  Tobacco Use  . Smoking status: Former Smoker    Packs/day: 0.20    Years: 20.00    Pack years: 4.00    Types: Cigarettes    Last attempt to quit: 08/16/2017    Years since quitting: 0.2  . Smokeless tobacco: Never Used  . Tobacco comment: Decreased smoking to less than 5 cigs/day  Substance and Sexual Activity  . Alcohol use: No  . Drug use: No  . Sexual activity: Not on file  Lifestyle  . Physical activity:    Days per week: 2 days    Minutes per session: 100 min  . Stress: To some extent  Relationships  . Social connections:    Talks on phone: More than three times a week    Gets together: Once a week    Attends religious service: Never    Active member of club or organization: No    Attends meetings of clubs or organizations: Never    Relationship status: Divorced  . Intimate partner violence:    Fear of  current or ex partner: No    Emotionally abused: No    Physically abused: No    Forced sexual activity: No  Other Topics Concern  . Not on file  Social History Narrative  . Not on file    Past Surgical History:  Procedure Laterality Date  . ANTERIOR CRUCIATE LIGAMENT REPAIR    . BACK SURGERY     L4 and L5  . CARDIAC CATHETERIZATION  04/09/2016  . TONSILLECTOMY AND ADENOIDECTOMY  2008  . TUBAL LIGATION      Immunization History  Administered Date(s) Administered  . Influenza-Unspecified 03/20/2013, 01/29/2014, 03/02/2015, 01/21/2016    Current Meds  Medication Sig  . clopidogrel (PLAVIX) 75 MG tablet Take 1 tablet (75 mg total) by mouth daily.  . furosemide (LASIX) 20 MG tablet Take 1 tablet (20 mg total) by mouth daily.  . isosorbide mononitrate (IMDUR) 60 MG 24 hr tablet One tablet twice daily  . meclizine (ANTIVERT) 25 MG tablet Take 1 tablet (25 mg total) by mouth 3  (three) times daily as needed for dizziness.  . metoprolol tartrate (LOPRESSOR) 25 MG tablet Take 1 tablet (25 mg total) by mouth 2 (two) times daily.  . nitroGLYCERIN (NITROSTAT) 0.4 MG SL tablet Place 1 tablet (0.4 mg total) under the tongue every 5 (five) minutes as needed for chest pain.  . pantoprazole (PROTONIX) 40 MG tablet Take 1 tablet (40 mg total) by mouth daily.  . potassium chloride SA (K-DUR,KLOR-CON) 20 MEQ tablet Take 1 tablet (20 mEq total) by mouth daily.  . simvastatin (ZOCOR) 20 MG tablet Take 1 tablet (20 mg total) by mouth daily.  Marland Kitchen triamcinolone ointment (KENALOG) 0.5 % Apply 1 application topically 2 (two) times daily.   Current Facility-Administered Medications for the 11/06/17 encounter (Office Visit) with Azzie Glatter, FNP  Medication  . EPINEPHrine (EPI-PEN) injection 0.3 mg    Allergies  Allergen Reactions  . Aspirin Anaphylaxis  . Celebrex [Celecoxib] Anaphylaxis and Hives  . Fentanyl Anaphylaxis  . Latex Hives  . Lisinopril Shortness Of Breath  . Percocet [Oxycodone-Acetaminophen] Shortness Of Breath  . Simvastatin-High Dose Hives and Shortness Of Breath    Can tolerate 20 mg dose.   Jonah Blue [Metaxalone] Anaphylaxis  . Morphine Swelling  . Vicodin [Hydrocodone-Acetaminophen] Hives and Other (See Comments)    Feels like her tongue swells  . Albuterol Palpitations    BP (!) 141/92   Pulse 79   Temp 98.1 F (36.7 C)   Ht 5\' 5"  (1.651 m)   Wt 275 lb 11.2 oz (125.1 kg)   LMP 09/08/2017 (Approximate)   BMI 45.88 kg/m   Review of Systems  Constitutional: Negative.   HENT: Negative.   Eyes: Negative.   Respiratory:       Heart palpitations.   Cardiovascular:       Chest discomfort  Gastrointestinal: Positive for abdominal distention (Obese).  Endocrine: Negative.   Genitourinary: Negative.   Musculoskeletal: Negative.   Skin: Negative.   Allergic/Immunologic: Negative.   Neurological: Positive for dizziness, syncope, weakness and  light-headedness.  Hematological: Negative.   Psychiatric/Behavioral: Negative.    Objective:   Physical Exam  Constitutional: She is oriented to person, place, and time. She appears well-developed and well-nourished.  HENT:  Head: Normocephalic and atraumatic.  Right Ear: External ear normal.  Left Ear: External ear normal.  Nose: Nose normal.  Mouth/Throat: Oropharynx is clear and moist.  Eyes: Pupils are equal, round, and reactive to light. Conjunctivae and EOM are normal.  Neck: Normal range of motion. Neck supple.  Cardiovascular: Intact distal pulses.  Heart murmur  Pulmonary/Chest: Effort normal and breath sounds normal.  Abdominal: Soft. Bowel sounds are normal.  Musculoskeletal: Normal range of motion.  Neurological: She is alert and oriented to person, place, and time.  Skin: Skin is warm and dry. Capillary refill takes less than 2 seconds.  Psychiatric: She has a normal mood and affect. Her behavior is normal. Judgment and thought content normal.  Nursing note and vitals reviewed.  Assessment & Plan:   1. Essential hypertension Blood pressure is 141/92 today. She will continue Plavix, Lasix, Imdur, and Metoprolol as prescribed.   She will continue to decrease high sodium intake, excessive alcohol intake, increase potassium intake, continue smoking cessation, and increase physical activity of at least 30 minutes of cardio activity daily. She will continue to follow Heart Healthy or DASH diet.  2. Mixed hyperlipidemia Lipid panel on 10/04/2017 was stable. She will continue Simvastatin as prescribed. She will continue ow fat diet.   3. Tobacco abuse She recently quit smoking. Continue smoking cessation.   4. Chest pain, unspecified type Occasional chest discomfort is stable at this time. She will continue to use Nitrostat as needed. She will continue to follow up with Dr. Fletcher Anon, Cardiologist as needed. She will report to ED if she has a further episode of chest pain  which does not subside with Nitrostat. Verbalizes understanding.   5. Follow up She will follow up in 3 months.  No orders of the defined types were placed in this encounter.   Kathe Becton,  MSN, FNP-C [facility] [address] [place], [place] [address] [phone]

## 2017-11-27 ENCOUNTER — Telehealth: Payer: Self-pay | Admitting: Cardiovascular Disease

## 2017-11-27 NOTE — Telephone Encounter (Signed)
LMOV to r/s patient from new to regular ov

## 2017-11-27 NOTE — Telephone Encounter (Signed)
Changed from new to OV

## 2017-12-06 ENCOUNTER — Encounter: Payer: Self-pay | Admitting: Cardiovascular Disease

## 2017-12-06 ENCOUNTER — Ambulatory Visit (INDEPENDENT_AMBULATORY_CARE_PROVIDER_SITE_OTHER): Payer: Self-pay | Admitting: Cardiovascular Disease

## 2017-12-06 ENCOUNTER — Encounter

## 2017-12-06 VITALS — BP 128/80 | HR 71 | Ht 66.0 in | Wt 277.8 lb

## 2017-12-06 DIAGNOSIS — Z720 Tobacco use: Secondary | ICD-10-CM

## 2017-12-06 DIAGNOSIS — I25118 Atherosclerotic heart disease of native coronary artery with other forms of angina pectoris: Secondary | ICD-10-CM

## 2017-12-06 DIAGNOSIS — E785 Hyperlipidemia, unspecified: Secondary | ICD-10-CM

## 2017-12-06 MED ORDER — RANOLAZINE ER 500 MG PO TB12: 500.0000 mg | ORAL_TABLET | Freq: Two times a day (BID) | ORAL | 3 refills | Status: DC

## 2017-12-06 NOTE — Progress Notes (Signed)
Cardiology Office Note   Date:  12/07/2017   ID:  Zakhia, Seres 1966/01/11, MRN [id number]  PCP:  System, Pcp Not In  Cardiologist:   [name], MD   Chief Complaint  Patient presents with  . other    6 mo f/u unspecified syncope.SOB ,CP. Medications verbally reviewed.       History of Present Illness: Kathy [name] is a 52 y.o. female who is here today for follow-up visit regarding chronic chest pain.    The patient has known history of recurrent chest pain usually responsive to nitroglycerin which is thought to be due to coronary spasm or endothelial dysfunction.  However, we have not seen objective evidence of that.  She had cardiac catheterization done 4 times in the past in 2007, 2011, 2012 and most recently in December 2017 at [facility]. All these showed mild nonobstructive coronary artery disease.  She is known to have a rate dependent left bundle branch block.  She is allergic to aspirin.  Other medical problems include tobacco use and hyperlipidemia.  She has family history of coronary artery disease. She reports inability to work due to chronic chest pain.  She usually develops dizziness with chest pain during exertions and she reports syncopal episode.  Chest pain wakes her up from sleep and she uses nitroglycerin. She quit smoking in May. Echocardiogram in November showed an EF of 55 to 60% with normal diastolic function and pulmonary pressure.   Past Medical History:  Diagnosis Date  . Asthma   . CHF (congestive heart failure) ([place])   . Coronary artery disease   . Heart murmur   . Hyperlipidemia   . Hypertension   . Myocardial infarction ([place])   . Obesity   . Seizures ([place])     Past Surgical History:  Procedure Laterality Date  . ANTERIOR CRUCIATE LIGAMENT REPAIR    . BACK SURGERY     L4 and L5  . CARDIAC CATHETERIZATION  04/09/2016  . TONSILLECTOMY AND ADENOIDECTOMY  2008  . TUBAL LIGATION       Current Outpatient Medications  Medication Sig  Dispense Refill  . clopidogrel (PLAVIX) 75 MG tablet Take 1 tablet (75 mg total) by mouth daily. 90 tablet 3  . furosemide (LASIX) 20 MG tablet Take 1 tablet (20 mg total) by mouth daily. 90 tablet 3  . isosorbide mononitrate (IMDUR) 60 MG 24 hr tablet One tablet twice daily 90 tablet 3  . meclizine (ANTIVERT) 25 MG tablet Take 1 tablet (25 mg total) by mouth 3 (three) times daily as needed for dizziness. 90 tablet 6  . metoprolol tartrate (LOPRESSOR) 25 MG tablet Take 1 tablet (25 mg total) by mouth 2 (two) times daily. 90 tablet 3  . nitroGLYCERIN (NITROSTAT) 0.4 MG SL tablet Place 1 tablet (0.4 mg total) under the tongue every 5 (five) minutes as needed for chest pain. 30 tablet 6  . pantoprazole (PROTONIX) 40 MG tablet Take 1 tablet (40 mg total) by mouth daily. 90 tablet 3  . potassium chloride SA (K-DUR,KLOR-CON) 20 MEQ tablet Take 1 tablet (20 mEq total) by mouth daily. 90 tablet 3  . simvastatin (ZOCOR) 20 MG tablet Take 1 tablet (20 mg total) by mouth daily. 90 tablet 3  . triamcinolone ointment (KENALOG) 0.5 % Apply 1 application topically 2 (two) times daily. 30 g 2  . ranolazine (RANEXA) 500 MG 12 hr tablet Take 1 tablet (500 mg total) by mouth 2 (two) times daily. 180 tablet 3  Current Facility-Administered Medications  Medication Dose Route Frequency Provider Last Rate Last Dose  . EPINEPHrine (EPI-PEN) injection 0.3 mg  0.3 mg Intramuscular Once PRN Hugelmeyer, Alexis, DO        Allergies:   Aspirin; Celebrex [celecoxib]; Fentanyl; Latex; Lisinopril; Percocet [oxycodone-acetaminophen]; Simvastatin-high dose; Skelaxin [metaxalone]; Morphine; Vicodin [hydrocodone-acetaminophen]; and Albuterol    Social History:  The patient  reports that she quit smoking about 3 months ago. Her smoking use included cigarettes. She has a 4.00 pack-year smoking history. She has never used smokeless tobacco. She reports that she does not drink alcohol or use drugs.   Family History:  The patient's  family history includes Asthma in her daughter and son; COPD in her sister; Diabetes in her mother and sister; Heart attack in her father and mother; Kidney disease in her sister; Stroke in her father, mother, and sister.    ROS:  Please see the history of present illness.   Otherwise, review of systems are positive for none.   All other systems are reviewed and negative.    PHYSICAL EXAM: VS:  BP 128/80 (BP Location: Left Arm, Patient Position: Sitting, Cuff Size: Large)   Pulse 71   Ht 5\' 6"  (1.676 m)   Wt 277 lb 12 oz (126 kg)   LMP 10/06/2017   BMI 44.83 kg/m  , BMI Body mass index is 44.83 kg/m. GEN: Well nourished, well developed, in no acute distress  HEENT: normal  Neck: no JVD, carotid bruits, or masses Cardiac: RRR; no  rubs, or gallops,no edema .  1/ 6 systolic murmur in the aortic area Respiratory:  clear to auscultation bilaterally, normal work of breathing GI: soft, nontender, nondistended, + BS MS: no deformity or atrophy  Skin: warm and dry, no rash Neuro:  Strength and sensation are intact Psych: euthymic mood, full affect   EKG:  EKG is ordered today. The ekg ordered today demonstrates normal sinus rhythm with no significant ST or T wave changes.   Recent Labs: 10/04/2017: ALT 26; BUN 10; Creatinine, Ser 0.82; Hemoglobin 14.0; Platelets 256; Potassium 4.9; Sodium 142; TSH 1.770    Lipid Panel    Component Value Date/Time   CHOL 158 10/04/2017 1918   TRIG 238 (H) 10/04/2017 1918   HDL 28 (L) 10/04/2017 1918   CHOLHDL 4.9 (H) 04/19/2017 1927   CHOLHDL 5.7 02/24/2016 0618   VLDL 30 02/24/2016 0618   LDLCALC 82 10/04/2017 1918      Wt Readings from Last 3 Encounters:  12/06/17 277 lb 12 oz (126 kg)  11/06/17 275 lb 11.2 oz (125.1 kg)  10/04/17 276 lb 9.6 oz (125.5 kg)       PAD Screen 02/23/2017  Previous PAD dx? No  Previous surgical procedure? No  Pain with walking? Yes  Subsides with rest? No  Feet/toe relief with dangling? No  Painful,  non-healing ulcers? No  Extremities discolored? No      ASSESSMENT AND PLAN:  1.  Mild coronary artery disease with other forms of angina: The patient had cardiac catheterization multiple times in the past which documented no obstructive coronary artery disease.  She has chronic angina thought to be due to coronary spasm or endothelial dysfunction.  Continue medical therapy.   EKG does not show any acute changes.  She is already on antianginal medications including metoprolol and Imdur.  I elected to add Ranexa 500 mg twice daily. I advised her to attempt starting an exercise program to improve overall conditioning.  2.  Cardiac  murmur: Seems to be physiologic.  Echocardiogram showed no significant valvular abnormalities.  3.  Hyperlipidemia: Continue treatment with simvastatin.  4.  Previous tobacco use: The patient quit in May and she was congratulated.    Disposition:   FU with me in 6 months  Signed,  [name], MD  12/07/2017 1:04 PM    [place]

## 2017-12-06 NOTE — Patient Instructions (Signed)
Medication Instructions: START Ranexa 500 mg twice daily  If you need a refill on your cardiac medications before your next appointment, please call your pharmacy.   Follow-Up: Your physician wants you to follow-up in 6 months with Dr. Fletcher Anon. You will receive a reminder letter in the mail two months in advance. If you don't receive a letter, please call our office at [phone] to schedule this follow-up appointment.  Thank you for choosing [facility] at [facility]!

## 2017-12-12 NOTE — Addendum Note (Signed)
Addended by: Alba Destine on: 12/12/2017 09:52 AM   Modules accepted: Orders

## 2017-12-18 ENCOUNTER — Other Ambulatory Visit: Payer: Self-pay | Admitting: Internal Medicine

## 2017-12-20 ENCOUNTER — Telehealth: Payer: Self-pay

## 2017-12-20 ENCOUNTER — Other Ambulatory Visit: Payer: Self-pay | Admitting: Urology

## 2017-12-20 MED ORDER — EPINEPHRINE 0.3 MG/0.3ML IJ SOAJ: 0.3000 mg | Freq: Once | INTRAMUSCULAR | 2 refills | Status: AC

## 2017-12-20 NOTE — Progress Notes (Signed)
Epi-Pen script on Med. Management

## 2017-12-20 NOTE — Telephone Encounter (Signed)
Received refill request from Medication Management for Epipen 2-pak 0.3mg  auto 2.  Instructions are to inject 0.59mls into the muscle once.  Entry for Epipen shows on meds and orders, however was listed as an injection so I was not able to indicate reorder.

## 2017-12-24 ENCOUNTER — Telehealth: Payer: Self-pay | Admitting: Pharmacist

## 2017-12-24 NOTE — Telephone Encounter (Signed)
12/24/2017 8:35:35 AM - Epi-Pen 2 pak 0.3 mg  12/24/17 Received a pharmacy printout for EpiPen 2 pak 0.3mg  Inject 0.3MLS (1 EpiPen) into the muscle once for one dose. Printed [employer] application, mailing patient her portion to sign & return, when patient returns forms will send to [facility] for provider to sign, due to patient & provider both have to sign one page.Kathy [name]

## 2018-01-09 ENCOUNTER — Telehealth: Payer: Self-pay | Admitting: Pharmacist

## 2018-01-09 NOTE — Telephone Encounter (Signed)
01/09/2018 3:52:46 PM - Epi-pen appliction to provider  01/09/18 sending Epi-Pen application to [facility] for [place] to sign.Delos Haring

## 2018-01-21 ENCOUNTER — Encounter: Payer: Self-pay | Admitting: Cardiovascular Disease

## 2018-01-21 NOTE — Progress Notes (Signed)
Fax received stating that the patient has been enrolled in the [employer] Patient Assistance Program.   Enrollment is valid through 04/09/18.

## 2018-02-11 ENCOUNTER — Telehealth: Payer: Self-pay | Admitting: Pharmacist

## 2018-02-11 NOTE — Telephone Encounter (Signed)
02/11/2018 10:32:27 AM - Epipen faxed to [employer]  19/0/12 Faxed [employer] application for Epipen 0.3mg  Inject 0.3mg  into the muscle once for one dose.Delos Haring

## 2018-02-12 ENCOUNTER — Other Ambulatory Visit: Payer: Self-pay | Admitting: Adult Health Nurse Practitioner

## 2018-02-12 ENCOUNTER — Ambulatory Visit: Payer: Self-pay | Admitting: Family Medicine

## 2018-02-12 ENCOUNTER — Encounter: Payer: Self-pay | Admitting: Adult Health Nurse Practitioner

## 2018-02-12 VITALS — BP 131/82 | HR 86 | Temp 97.6°F | Ht 65.0 in | Wt 298.1 lb

## 2018-02-12 DIAGNOSIS — Z09 Encounter for follow-up examination after completed treatment for conditions other than malignant neoplasm: Secondary | ICD-10-CM

## 2018-02-12 DIAGNOSIS — E782 Mixed hyperlipidemia: Secondary | ICD-10-CM

## 2018-02-12 DIAGNOSIS — L309 Dermatitis, unspecified: Secondary | ICD-10-CM

## 2018-02-12 DIAGNOSIS — I10 Essential (primary) hypertension: Secondary | ICD-10-CM

## 2018-02-12 DIAGNOSIS — R21 Rash and other nonspecific skin eruption: Secondary | ICD-10-CM

## 2018-02-12 DIAGNOSIS — R601 Generalized edema: Secondary | ICD-10-CM

## 2018-02-12 MED ORDER — FUROSEMIDE 40 MG PO TABS: 40.0000 mg | ORAL_TABLET | Freq: Every day | ORAL | 3 refills | Status: DC

## 2018-02-12 NOTE — Patient Instructions (Addendum)
DASH Eating Plan DASH stands for "Dietary Approaches to Stop Hypertension." The DASH eating plan is a healthy eating plan that has been shown to reduce high blood pressure (hypertension). It may also reduce your risk for type 2 diabetes, heart disease, and stroke. The DASH eating plan may also help with weight loss. What are tips for following this plan? General guidelines  Avoid eating more than 2,300 mg (milligrams) of salt (sodium) a day. If you have hypertension, you may need to reduce your sodium intake to 1,500 mg a day.  Limit alcohol intake to no more than 1 drink a day for nonpregnant women and 2 drinks a day for men. One drink equals 12 oz of beer, 5 oz of wine, or 1 oz of hard liquor.  Work with your health care provider to maintain a healthy body weight or to lose weight. Ask what an ideal weight is for you.  Get at least 30 minutes of exercise that causes your heart to beat faster (aerobic exercise) most days of the week. Activities may include walking, swimming, or biking.  Work with your health care provider or diet and nutrition specialist (dietitian) to adjust your eating plan to your individual calorie needs. Reading food labels  Check food labels for the amount of sodium per serving. Choose foods with less than 5 percent of the Daily Value of sodium. Generally, foods with less than 300 mg of sodium per serving fit into this eating plan.  To find whole grains, look for the word "whole" as the first word in the ingredient list. Shopping  Buy products labeled as "low-sodium" or "no salt added."  Buy fresh foods. Avoid canned foods and premade or frozen meals. Cooking  Avoid adding salt when cooking. Use salt-free seasonings or herbs instead of table salt or sea salt. Check with your health care provider or pharmacist before using salt substitutes.  Do not fry foods. Cook foods using healthy methods such as baking, boiling, grilling, and broiling instead.  Cook with  heart-healthy oils, such as olive, canola, soybean, or sunflower oil. Meal planning   Eat a balanced diet that includes: ? 5 or more servings of fruits and vegetables each day. At each meal, try to fill half of your plate with fruits and vegetables. ? Up to 6-8 servings of whole grains each day. ? Less than 6 oz of lean meat, poultry, or fish each day. A 3-oz serving of meat is about the same size as a deck of cards. One egg equals 1 oz. ? 2 servings of low-fat dairy each day. ? A serving of nuts, seeds, or beans 5 times each week. ? Heart-healthy fats. Healthy fats called Omega-3 fatty acids are found in foods such as flaxseeds and coldwater fish, like sardines, salmon, and mackerel.  Limit how much you eat of the following: ? Canned or prepackaged foods. ? Food that is high in trans fat, such as fried foods. ? Food that is high in saturated fat, such as fatty meat. ? Sweets, desserts, sugary drinks, and other foods with added sugar. ? Full-fat dairy products.  Do not salt foods before eating.  Try to eat at least 2 vegetarian meals each week.  Eat more home-cooked food and less restaurant, buffet, and fast food.  When eating at a restaurant, ask that your food be prepared with less salt or no salt, if possible. What foods are recommended? The items listed may not be a complete list. Talk with your dietitian about what   dietary choices are best for you. Grains Whole-grain or whole-wheat bread. Whole-grain or whole-wheat pasta. Brown rice. [place]. Bulgur. Whole-grain and low-sodium cereals. Pita bread. Low-fat, low-sodium crackers. Whole-wheat flour tortillas. Vegetables Fresh or frozen vegetables (raw, steamed, roasted, or grilled). Low-sodium or reduced-sodium tomato and vegetable juice. Low-sodium or reduced-sodium tomato sauce and tomato paste. Low-sodium or reduced-sodium canned vegetables. Fruits All fresh, dried, or frozen fruit. Canned fruit in natural juice (without  added sugar). Meat and other protein foods Skinless chicken or [place]. Ground chicken or [place]. Pork with fat trimmed off. Fish and seafood. Egg whites. Dried beans, peas, or lentils. Unsalted nuts, nut butters, and seeds. Unsalted canned beans. Lean cuts of beef with fat trimmed off. Low-sodium, lean deli meat. Dairy Low-fat (1%) or fat-free (skim) milk. Fat-free, low-fat, or reduced-fat cheeses. Nonfat, low-sodium ricotta or cottage cheese. Low-fat or nonfat yogurt. Low-fat, low-sodium cheese. Fats and oils Soft margarine without trans fats. Vegetable oil. Low-fat, reduced-fat, or light mayonnaise and salad dressings (reduced-sodium). Canola, safflower, olive, soybean, and sunflower oils. Avocado. Seasoning and other foods Herbs. Spices. Seasoning mixes without salt. Unsalted popcorn and pretzels. Fat-free sweets. What foods are not recommended? The items listed may not be a complete list. Talk with your dietitian about what dietary choices are best for you. Grains Baked goods made with fat, such as croissants, muffins, or some breads. Dry pasta or rice meal packs. Vegetables Creamed or fried vegetables. Vegetables in a cheese sauce. Regular canned vegetables (not low-sodium or reduced-sodium). Regular canned tomato sauce and paste (not low-sodium or reduced-sodium). Regular tomato and vegetable juice (not low-sodium or reduced-sodium). Angie Fava. Olives. Fruits Canned fruit in a light or heavy syrup. Fried fruit. Fruit in cream or butter sauce. Meat and other protein foods Fatty cuts of meat. Ribs. Fried meat. Berniece Salines. Sausage. Bologna and other processed lunch meats. Salami. [place]back. Hotdogs. Bratwurst. Salted nuts and seeds. Canned beans with added salt. Canned or smoked fish. Whole eggs or egg yolks. Chicken or [place] with skin. Dairy Whole or 2% milk, cream, and half-and-half. Whole or full-fat cream cheese. Whole-fat or sweetened yogurt. Full-fat cheese. Nondairy creamers. Whipped toppings.  Processed cheese and cheese spreads. Fats and oils Butter. Stick margarine. Lard. Shortening. Ghee. Bacon fat. Tropical oils, such as coconut, palm kernel, or palm oil. Seasoning and other foods Salted popcorn and pretzels. Onion salt, garlic salt, seasoned salt, table salt, and sea salt. Worcestershire sauce. Tartar sauce. Barbecue sauce. Teriyaki sauce. Soy sauce, including reduced-sodium. Steak sauce. Canned and packaged gravies. Fish sauce. Oyster sauce. Cocktail sauce. Horseradish that you find on the shelf. Ketchup. Mustard. Meat flavorings and tenderizers. Bouillon cubes. Hot sauce and Tabasco sauce. Premade or packaged marinades. Premade or packaged taco seasonings. Relishes. Regular salad dressings. Where to find more information:  National Heart, Lung, and [place]: https://wilson-eaton.com/  American Heart Association: www.heart.org Summary  The DASH eating plan is a healthy eating plan that has been shown to reduce high blood pressure (hypertension). It may also reduce your risk for type 2 diabetes, heart disease, and stroke.  With the DASH eating plan, you should limit salt (sodium) intake to 2,300 mg a day. If you have hypertension, you may need to reduce your sodium intake to 1,500 mg a day.  When on the DASH eating plan, aim to eat more fresh fruits and vegetables, whole grains, lean proteins, low-fat dairy, and heart-healthy fats.  Work with your health care provider or diet and nutrition specialist (dietitian) to adjust your eating plan to your individual  calorie needs. This information is not intended to replace advice given to you by your health care provider. Make sure you discuss any questions you have with your health care provider. Document Released: 03/16/2011 Document Revised: 03/20/2016 Document Reviewed: 03/20/2016 Elsevier Interactive Patient Education  2018 [place] Heart-healthy meal planning includes:  Limiting unhealthy  fats.  Increasing healthy fats.  Making other small dietary changes.  You may need to talk with your doctor or a diet specialist (dietitian) to create an eating plan that is right for you. What types of fat should I choose?  Choose healthy fats. These include olive oil and canola oil, flaxseeds, walnuts, almonds, and seeds.  Eat more omega-3 fats. These include salmon, mackerel, sardines, tuna, flaxseed oil, and ground flaxseeds. Try to eat fish at least twice each week.  Limit saturated fats. ? Saturated fats are often found in animal products, such as meats, butter, and cream. ? Plant sources of saturated fats include palm oil, palm kernel oil, and coconut oil.  Avoid foods with partially hydrogenated oils in them. These include stick margarine, some tub margarines, cookies, crackers, and other baked goods. These contain trans fats. What general guidelines do I need to follow?  Check food labels carefully. Identify foods with trans fats or high amounts of saturated fat.  Fill one half of your plate with vegetables and green salads. Eat 4-5 servings of vegetables per day. A serving of vegetables is: ? 1 cup of raw leafy vegetables. ?  cup of raw or cooked cut-up vegetables. ?  cup of vegetable juice.  Fill one fourth of your plate with whole grains. Look for the word "whole" as the first word in the ingredient list.  Fill one fourth of your plate with lean protein foods.  Eat 4-5 servings of fruit per day. A serving of fruit is: ? One medium whole fruit. ?  cup of dried fruit. ?  cup of fresh, frozen, or canned fruit. ?  cup of 100% fruit juice.  Eat more foods that contain soluble fiber. These include apples, broccoli, carrots, beans, peas, and barley. Try to get 20-30 g of fiber per day.  Eat more home-cooked food. Eat less restaurant, buffet, and fast food.  Limit or avoid alcohol.  Limit foods high in starch and sugar.  Avoid fried foods.  Avoid frying your  food. Try baking, boiling, grilling, or broiling it instead. You can also reduce fat by: ? Removing the skin from poultry. ? Removing all visible fats from meats. ? Skimming the fat off of stews, soups, and gravies before serving them. ? Steaming vegetables in water or broth.  Lose weight if you are overweight.  Eat 4-5 servings of nuts, legumes, and seeds per week: ? One serving of dried beans or legumes equals  cup after being cooked. ? One serving of nuts equals 1 ounces. ? One serving of seeds equals  ounce or one tablespoon.  You may need to keep track of how much salt or sodium you eat. This is especially true if you have high blood pressure. Talk with your doctor or dietitian to get more information. What foods can I eat? Grains Breads, including [place], white, pita, wheat, raisin, rye, oatmeal, and [place]. Tortillas that are neither fried nor made with lard or trans fat. Low-fat rolls, including hotdog and hamburger buns and English muffins. Biscuits. Muffins. Waffles. Pancakes. Light popcorn. Whole-grain cereals. Flatbread. Melba toast. Pretzels. Breadsticks. Rusks. Low-fat snacks. Low-fat crackers, including oyster, saltine,  matzo, graham, animal, and rye. Rice and pasta, including brown rice and pastas that are made with whole wheat. Vegetables All vegetables. Fruits All fruits, but limit coconut. Meats and Other Protein Sources Lean, well-trimmed beef, veal, pork, and lamb. Chicken and [place] without skin. All fish and shellfish. Wild duck, rabbit, pheasant, and venison. Egg whites or low-cholesterol egg substitutes. Dried beans, peas, lentils, and tofu. Seeds and most nuts. Dairy Low-fat or nonfat cheeses, including ricotta, string, and mozzarella. Skim or 1% milk that is liquid, powdered, or evaporated. Buttermilk that is made with low-fat milk. Nonfat or low-fat yogurt. Beverages Mineral water. Diet carbonated beverages. Sweets and Desserts Sherbets and fruit ices.  Honey, jam, marmalade, jelly, and syrups. Meringues and gelatins. Pure sugar candy, such as hard candy, jelly beans, gumdrops, mints, marshmallows, and small amounts of dark chocolate. [employer] Eat all sweets and desserts in moderation. Fats and Oils Nonhydrogenated (trans-free) margarines. Vegetable oils, including soybean, sesame, sunflower, olive, peanut, safflower, corn, canola, and cottonseed. Salad dressings or mayonnaise made with a vegetable oil. Limit added fats and oils that you use for cooking, baking, salads, and as spreads. Other Cocoa powder. Coffee and tea. All seasonings and condiments. The items listed above may not be a complete list of recommended foods or beverages. Contact your dietitian for more options. What foods are not recommended? Grains Breads that are made with saturated or trans fats, oils, or whole milk. Croissants. Butter rolls. Cheese breads. Sweet rolls. Donuts. Buttered popcorn. Chow mein noodles. High-fat crackers, such as cheese or butter crackers. Meats and Other Protein Sources Fatty meats, such as hotdogs, short ribs, sausage, spareribs, bacon, rib eye roast or steak, and mutton. High-fat deli meats, such as salami and bologna. Caviar. Domestic duck and goose. Organ meats, such as kidney, liver, sweetbreads, and heart. Dairy Cream, sour cream, cream cheese, and creamed cottage cheese. Whole-milk cheeses, including blue (bleu), Monte[place] Jack, [place], [place], American, [place], Swiss, cheddar, [place], and [place]. Whole or 2% milk that is liquid, evaporated, or condensed. Whole buttermilk. Cream sauce or high-fat cheese sauce. Yogurt that is made from whole milk. Beverages Regular sodas and juice drinks with added sugar. Sweets and Desserts Frosting. Pudding. Cookies. Cakes other than angel food cake. Candy that has milk chocolate or white chocolate, hydrogenated fat, butter, coconut, or unknown ingredients. Buttered syrups. Full-fat ice cream or ice  cream drinks. Fats and Oils Gravy that has suet, meat fat, or shortening. Cocoa butter, hydrogenated oils, palm oil, coconut oil, palm kernel oil. These can often be found in baked products, candy, fried foods, nondairy creamers, and whipped toppings. Solid fats and shortenings, including bacon fat, salt pork, lard, and butter. Nondairy cream substitutes, such as coffee creamers and sour cream substitutes. Salad dressings that are made of unknown oils, cheese, or sour cream. The items listed above may not be a complete list of foods and beverages to avoid. Contact your dietitian for more information. This information is not intended to replace advice given to you by your health care provider. Make sure you discuss any questions you have with your health care provider. Document Released: 09/26/2011 Document Revised: 09/02/2015 Document Reviewed: 09/18/2013 Elsevier Interactive Patient Education  2018 [employer].     Furosemide tablets What is this medicine? FUROSEMIDE (fyoor [place] se mide) is a diuretic. It helps you make more urine and to lose salt and excess water from your body. This medicine is used to treat high blood pressure, and edema or swelling from heart, kidney, or  liver disease. This medicine may be used for other purposes; ask your health care provider or pharmacist if you have questions. COMMON BRAND NAME(S): Active-Medicated Specimen Kit, Delone, Diuscreen, Lasix, RX Specimen Collection Kit, Specimen Collection Kit, URINX Medicated Specimen Collection What should I tell my health care provider before I take this medicine? They need to know if you have any of these conditions: -abnormal blood electrolytes -diarrhea or vomiting -gout -heart disease -kidney disease, small amounts of urine, or difficulty passing urine -liver disease -thyroid disease -an unusual or allergic reaction to furosemide, sulfa drugs, other medicines, foods, dyes, or preservatives -pregnant or trying to  get pregnant -breast-feeding How should I use this medicine? Take this medicine by mouth with a glass of water. Follow the directions on the prescription label. You may take this medicine with or without food. If it upsets your stomach, take it with food or milk. Do not take your medicine more often than directed. Remember that you will need to pass more urine after taking this medicine. Do not take your medicine at a time of day that will cause you problems. Do not take at bedtime. Talk to your pediatrician regarding the use of this medicine in children. While this drug may be prescribed for selected conditions, precautions do apply. Overdosage: If you think you have taken too much of this medicine contact a poison control center or emergency room at once. NOTE: This medicine is only for you. Do not share this medicine with others. What if I miss a dose? If you miss a dose, take it as soon as you can. If it is almost time for your next dose, take only that dose. Do not take double or extra doses. What may interact with this medicine? -aspirin and aspirin-like medicines -certain antibiotics -chloral hydrate -cisplatin -cyclosporine -digoxin -diuretics -laxatives -lithium -medicines for blood pressure -medicines that relax muscles for surgery -methotrexate -NSAIDs, medicines for pain and inflammation like ibuprofen, naproxen, or indomethacin -phenytoin -steroid medicines like prednisone or cortisone -sucralfate -thyroid hormones This list may not describe all possible interactions. Give your health care provider a list of all the medicines, herbs, non-prescription drugs, or dietary supplements you use. Also tell them if you smoke, drink alcohol, or use illegal drugs. Some items may interact with your medicine. What should I watch for while using this medicine? Visit your doctor or health care professional for regular checks on your progress. Check your blood pressure regularly. Ask your  doctor or health care professional what your blood pressure should be, and when you should contact him or her. If you are a diabetic, check your blood sugar as directed. You may need to be on a special diet while taking this medicine. Check with your doctor. Also, ask how many glasses of fluid you need to drink a day. You must not get dehydrated. You may get drowsy or dizzy. Do not drive, use machinery, or do anything that needs mental alertness until you know how this drug affects you. Do not stand or sit up quickly, especially if you are an older patient. This reduces the risk of dizzy or fainting spells. Alcohol can make you more drowsy and dizzy. Avoid alcoholic drinks. This medicine can make you more sensitive to the sun. Keep out of the sun. If you cannot avoid being in the sun, wear protective clothing and use sunscreen. Do not use sun lamps or tanning beds/booths. What side effects may I notice from receiving this medicine? Side effects that you should report  to your doctor or health care professional as soon as possible: -blood in urine or stools -dry mouth -fever or chills -hearing loss or ringing in the ears -irregular heartbeat -muscle pain or weakness, cramps -skin rash -stomach upset, pain, or nausea -tingling or numbness in the hands or feet -unusually weak or tired -vomiting or diarrhea -yellowing of the eyes or skin Side effects that usually do not require medical attention (report to your doctor or health care professional if they continue or are bothersome): -headache -loss of appetite -unusual bleeding or bruising This list may not describe all possible side effects. Call your doctor for medical advice about side effects. You may report side effects to FDA at 1-800-FDA-1088. Where should I keep my medicine? Keep out of the reach of children. Store at room temperature between 15 and 30 degrees C (59 and 86 degrees F). Protect from light. Throw away any unused medicine  after the expiration date. NOTE: This sheet is a summary. It may not cover all possible information. If you have questions about this medicine, talk to your doctor, pharmacist, or health care provider.  2018 Elsevier/Gold Standard (2014-06-17 13:49:50)

## 2018-02-12 NOTE — Progress Notes (Signed)
Follow Up  Subjective:    Patient ID: Kathy [name], female    DOB: [birth year], 52 y.o.   MRN: [id number]  Chief Complaint  Patient presents with  . Follow-up    HPI  [name] is a 52 year old female with a past medical history of Seizures, Obesity, MI, Hypertension, Hyperlipidemia, Heart Murmur, CAD, CHF, and Asthma. She is here today for follow up of her chronic diseases.   Current Status: Since her last office visit, she is doing well.  She recently began Ranexa 500 mg BID in 01/18/2018. She has fluid weight gain in the past 2 months. She denies visual changes, chest pain, cough, shortness of breath, heart palpitations, and falls. She has occasionally headaches and dizziness with position changes. Denies severe headaches, confusion, seizures, double vision, and blurred vision, nausea and vomiting.  She denies fevers, chills, fatigue, recent infections, weight loss, and night sweats. She has not had any falls. No reports of GI problems such as diarrhea, and constipation. She has no reports of blood in stools, dysuria and hematuria. No depression or anxiety reported. She denies pain today.   Past Medical History:  Diagnosis Date  . Asthma   . CHF (congestive heart failure) ([place])   . Coronary artery disease   . Heart murmur   . Hyperlipidemia   . Hypertension   . Myocardial infarction ([place])   . Obesity   . Seizures ([place])     Family History  Problem Relation Age of Onset  . Kidney disease Sister   . Diabetes Sister   . COPD Sister   . Stroke Sister   . Diabetes Mother   . Stroke Mother   . Heart attack Mother   . Stroke Father   . Heart attack Father   . Asthma Daughter   . Asthma Son     Social History   Socioeconomic History  . Marital status: Divorced    Spouse name: Not on file  . Number of children: 2  . Years of education: pre-requisites   . Highest education level: GED or equivalent  Occupational History  . Occupation: unemployed  Social Needs  .  Financial resource strain: Hard  . Food insecurity:    Worry: Often true    Inability: Often true  . Transportation needs:    Medical: No    Non-medical: No  Tobacco Use  . Smoking status: Former Smoker    Packs/day: 0.20    Years: 20.00    Pack years: 4.00    Types: Cigarettes    Last attempt to quit: 08/16/2017    Years since quitting: 0.4  . Smokeless tobacco: Never Used  . Tobacco comment: Decreased smoking to less than 5 cigs/day  Substance and Sexual Activity  . Alcohol use: No  . Drug use: No  . Sexual activity: Not on file  Lifestyle  . Physical activity:    Days per week: 2 days    Minutes per session: 100 min  . Stress: To some extent  Relationships  . Social connections:    Talks on phone: More than three times a week    Gets together: Once a week    Attends religious service: Never    Active member of club or organization: No    Attends meetings of clubs or organizations: Never    Relationship status: Divorced  . Intimate partner violence:    Fear of current or ex partner: No    Emotionally abused: No  Physically abused: No    Forced sexual activity: No  Other Topics Concern  . Not on file  Social History Narrative   Currently on food stamps. They help somewhat but still difficult to buy food that she needs. Boyfriend helps pay rent with disability, they live together. Roof is leaking really badly. Lives in modular home. Social services bought tarp which helped, but it deteriorated from heat.     Past Surgical History:  Procedure Laterality Date  . ANTERIOR CRUCIATE LIGAMENT REPAIR    . BACK SURGERY     L4 and L5  . CARDIAC CATHETERIZATION  04/09/2016  . TONSILLECTOMY AND ADENOIDECTOMY  2008  . TUBAL LIGATION      Immunization History  Administered Date(s) Administered  . Influenza-Unspecified 03/20/2013, 01/29/2014, 03/02/2015, 01/21/2016, 01/04/2018   Current Meds  Medication Sig  . clopidogrel (PLAVIX) 75 MG tablet Take 1 tablet (75 mg  total) by mouth daily.  . isosorbide mononitrate (IMDUR) 60 MG 24 hr tablet One tablet twice daily  . meclizine (ANTIVERT) 25 MG tablet Take 1 tablet (25 mg total) by mouth 3 (three) times daily as needed for dizziness.  . metoprolol tartrate (LOPRESSOR) 25 MG tablet Take 1 tablet (25 mg total) by mouth 2 (two) times daily.  . nitroGLYCERIN (NITROSTAT) 0.4 MG SL tablet Place 1 tablet (0.4 mg total) under the tongue every 5 (five) minutes as needed for chest pain.  . pantoprazole (PROTONIX) 40 MG tablet Take 1 tablet (40 mg total) by mouth daily.  . potassium chloride SA (K-DUR,KLOR-CON) 20 MEQ tablet Take 1 tablet (20 mEq total) by mouth daily.  . ranolazine (RANEXA) 500 MG 12 hr tablet Take 1 tablet (500 mg total) by mouth 2 (two) times daily. (Patient taking differently: Take 500 mg by mouth 2 (two) times daily. )  . simvastatin (ZOCOR) 20 MG tablet Take 1 tablet (20 mg total) by mouth daily.  . [DISCONTINUED] furosemide (LASIX) 20 MG tablet Take 1 tablet (20 mg total) by mouth daily.   Current Facility-Administered Medications for the 02/12/18 encounter (Office Visit) with Azzie Glatter, FNP  Medication  . EPINEPHrine (EPI-PEN) injection 0.3 mg    Allergies  Allergen Reactions  . Aspirin Anaphylaxis  . Celebrex [Celecoxib] Anaphylaxis and Hives  . Fentanyl Anaphylaxis  . Latex Hives  . Lisinopril Shortness Of Breath  . Percocet [Oxycodone-Acetaminophen] Shortness Of Breath  . Simvastatin-High Dose Hives and Shortness Of Breath    Can tolerate 20 mg dose.   Kathy [name] [Metaxalone] Anaphylaxis  . Morphine Swelling  . Vicodin [Hydrocodone-Acetaminophen] Hives and Other (See Comments)    Feels like her tongue swells  . Albuterol Palpitations    BP 131/82   Pulse 86   Temp 97.6 F (36.4 C)   Ht 5\' 5"  (1.651 m)   Wt 298 lb 1.6 oz (135.2 kg)   BMI 49.61 kg/m   Review of Systems  Constitutional: Positive for fatigue.  HENT: Negative.   Respiratory: Negative.     Cardiovascular: Negative.   Gastrointestinal: Negative.   Genitourinary: Negative.   Musculoskeletal: Negative.   Skin: Positive for rash (over entire body).  Allergic/Immunologic: Negative.   Neurological: Positive for dizziness (Occasional) and headaches.  Hematological: Negative.   Psychiatric/Behavioral: Negative.    Objective:   Physical Exam  Constitutional: She is oriented to person, place, and time. She appears well-developed and well-nourished.  HENT:  Head: Normocephalic and atraumatic.  Eyes: Pupils are equal, round, and reactive to light. EOM are normal.  Neck: Normal range of motion. Neck supple.  Cardiovascular: Normal rate, regular rhythm, normal heart sounds and intact distal pulses.  Pulmonary/Chest: Effort normal and breath sounds normal.  Abdominal: Soft. Bowel sounds are normal.  Musculoskeletal: Normal range of motion.  Neurological: She is alert and oriented to person, place, and time.  Skin: Skin is warm and dry.     Psychiatric: She has a normal mood and affect. Her behavior is normal. Judgment and thought content normal.  Nursing note and vitals reviewed.  Assessment & Plan:   1. Essential hypertension Blood pressure is stable at 131/82 today. Continue Plavix, Imdur, Metoprolol, and Ranedex as prescribed.  She will continue to decrease high sodium intake, excessive alcohol intake, increase potassium intake, smoking cessation, and increase physical activity of at least 30 minutes of cardio activity daily. She will continue to follow Heart Healthy or DASH diet. - furosemide (LASIX) 40 MG tablet; Take 1 tablet (40 mg total) by mouth daily.  Dispense: 30 tablet; Refill: 3  2. Eczema, unspecified type Continue Triamcinolone Ointment as needed. Avoid hot showers, soaps and lotions with heavy perfumes. Keep skin moisturized with lotions like Eucerin.   3. Rash  4. Mixed hyperlipidemia Continue Simvastatin as prescribed. Monitor.   5. Morbid obesity  ([place]) Body mass index is 49.61 kg/m. Goal BMI  is <30. Encouraged efforts to reduce weight include engaging in physical activity as tolerated with goal of 150 minutes per week. Improve dietary choices and eat a meal regimen consistent with a Mediterranean or DASH diet. Reduce simple carbohydrates. Do not skip meals and eat healthy snacks throughout the day to avoid over-eating at dinner. Set a goal weight loss that is achievable for you.  6. Generalized edema Diuretic is effective. Continue Lasix as prescribed. Monitor.  - furosemide (LASIX) 40 MG tablet; Take 1 tablet (40 mg total) by mouth daily.  Dispense: 30 tablet; Refill: 3  7. Follow up She will follow up in 6 months.   Meds ordered this encounter  Medications  . furosemide (LASIX) 40 MG tablet    Sig: Take 1 tablet (40 mg total) by mouth daily.    Dispense:  30 tablet    Refill:  [place],  MSN, FNP-C [facility] [address] [place], [place] [address] [phone]

## 2018-02-14 ENCOUNTER — Encounter: Payer: Self-pay | Admitting: Family Medicine

## 2018-04-12 ENCOUNTER — Telehealth: Payer: Self-pay | Admitting: Cardiovascular Disease

## 2018-04-12 MED ORDER — RANOLAZINE ER 500 MG PO TB12: 500.0000 mg | ORAL_TABLET | Freq: Two times a day (BID) | ORAL | 3 refills | Status: DC

## 2018-04-12 NOTE — Telephone Encounter (Signed)
RX sent to Pharmacy for generic ranexa.

## 2018-04-12 NOTE — Telephone Encounter (Signed)
°*  STAT* If patient is at the pharmacy, call can be transferred to refill team.   1. Which medications need to be refilled? (please list name of each medication and dose if known)  Renexa, would like generic brand 500 MG 2 times daily   2. Which pharmacy/location (including street and city if local pharmacy) is medication to be sent to? [facility] [phone]  3. Do they need a 30 day or 90 day supply? 90 day

## 2018-04-16 NOTE — Telephone Encounter (Signed)
Spoke with Cyril Mourning, pharmacist from [facility].  She stated that they do not carry Generic for Ranexa anymore. She states that the patient may need to be switched to something different for her to get her medication.  Please advise.   Cyril Mourning, pharmacist at [facility] stated you can contact her if you have any questions about what they do carry.    [phone] is the direct line to her.

## 2018-04-16 NOTE — Telephone Encounter (Signed)
Patient calling  States that she is having trouble getting generic Ranexa with [facility] Please advise

## 2018-04-17 NOTE — Telephone Encounter (Signed)
Returned the call to the pharmacist. She stated that they would not be able to provide the generic Ranexa any longer for the patient and would like to know what other medication or options could be used.   The patient is currently on Imdur 60 mg bid.

## 2018-04-17 NOTE — Telephone Encounter (Signed)
There is no alternative to Ranexa.  If she is not able to get it, we can just stop it and continue other medications.

## 2018-04-23 NOTE — Addendum Note (Signed)
Addended by: Ricci Barker on: 04/23/2018 02:37 PM   Modules accepted: Orders

## 2018-04-23 NOTE — Telephone Encounter (Signed)
Call returned to [facility]. She stated that they are unable to provide Ranexa anymore.  The patient has been made aware of this and Dr. Tyrell Antonio reply. The medication will be discontinued for now.

## 2018-05-10 ENCOUNTER — Other Ambulatory Visit: Payer: Self-pay | Admitting: Urology

## 2018-05-10 ENCOUNTER — Other Ambulatory Visit: Payer: Self-pay | Admitting: Internal Medicine

## 2018-05-10 DIAGNOSIS — E1159 Type 2 diabetes mellitus with other circulatory complications: Secondary | ICD-10-CM

## 2018-05-10 DIAGNOSIS — I10 Essential (primary) hypertension: Secondary | ICD-10-CM

## 2018-05-15 ENCOUNTER — Other Ambulatory Visit: Payer: Self-pay | Admitting: Internal Medicine

## 2018-05-16 ENCOUNTER — Other Ambulatory Visit: Payer: Self-pay | Admitting: Gerontology

## 2018-08-08 ENCOUNTER — Other Ambulatory Visit: Payer: Self-pay

## 2018-08-08 ENCOUNTER — Ambulatory Visit: Payer: Self-pay | Admitting: Adult Health Nurse Practitioner

## 2018-08-08 DIAGNOSIS — R7303 Prediabetes: Secondary | ICD-10-CM

## 2018-08-08 DIAGNOSIS — E1159 Type 2 diabetes mellitus with other circulatory complications: Secondary | ICD-10-CM

## 2018-08-08 DIAGNOSIS — R601 Generalized edema: Secondary | ICD-10-CM

## 2018-08-08 DIAGNOSIS — I10 Essential (primary) hypertension: Secondary | ICD-10-CM

## 2018-08-08 DIAGNOSIS — E782 Mixed hyperlipidemia: Secondary | ICD-10-CM

## 2018-08-08 MED ORDER — FUROSEMIDE 40 MG PO TABS: 40.0000 mg | ORAL_TABLET | Freq: Every day | ORAL | 3 refills | Status: DC

## 2018-08-08 MED ORDER — METOPROLOL TARTRATE 25 MG PO TABS: ORAL_TABLET | ORAL | 1 refills | Status: DC

## 2018-08-08 MED ORDER — POTASSIUM CHLORIDE CRYS ER 20 MEQ PO TBCR: 20.0000 meq | EXTENDED_RELEASE_TABLET | Freq: Every day | ORAL | 1 refills | Status: DC

## 2018-08-08 MED ORDER — CLOPIDOGREL BISULFATE 75 MG PO TABS: 75.0000 mg | ORAL_TABLET | Freq: Every day | ORAL | 1 refills | Status: DC

## 2018-08-08 MED ORDER — ISOSORBIDE MONONITRATE ER 60 MG PO TB24: ORAL_TABLET | ORAL | 1 refills | Status: DC

## 2018-08-08 MED ORDER — SIMVASTATIN 20 MG PO TABS: 20.0000 mg | ORAL_TABLET | Freq: Every day | ORAL | 1 refills | Status: DC

## 2018-08-08 NOTE — Progress Notes (Signed)
  Patient: Kathy [name] Female    DOB: 1965-10-20   53 y.o.   MRN: [id number] Visit Date: 08/08/2018  Today's Provider: [facility]   No chief complaint on file.  Subjective:    HPI  Virtual visit.   Right hand cramps that radiate up to her shoulder- not doing anything that would cause the cramps. On lasix 40mg  daily taking KCL supplement as well.  Cramp lasts about a minute and if she massages it it will help.   Taking all medications as directed.  Unable to take BP at home- last visit BP was WNL.     Allergies  Allergen Reactions  . Aspirin Anaphylaxis  . Celebrex [Celecoxib] Anaphylaxis and Hives  . Fentanyl Anaphylaxis  . Latex Hives  . Lisinopril Shortness Of Breath  . Percocet [Oxycodone-Acetaminophen] Shortness Of Breath  . Simvastatin-High Dose Hives and Shortness Of Breath    Can tolerate 20 mg dose.   Jonah Blue [Metaxalone] Anaphylaxis  . Morphine Swelling  . Vicodin [Hydrocodone-Acetaminophen] Hives and Other (See Comments)    Feels like her tongue swells  . Albuterol Palpitations   Previous Medications   CLOPIDOGREL (PLAVIX) 75 MG TABLET    TAKE ONE TABLET BY MOUTH EVERY DAY   FUROSEMIDE (LASIX) 40 MG TABLET    Take 1 tablet (40 mg total) by mouth daily.   ISOSORBIDE MONONITRATE (IMDUR) 60 MG 24 HR TABLET    TAKE ONE TABLET BY MOUTH 2 TIMES A DAY   MECLIZINE (ANTIVERT) 25 MG TABLET    Take 1 tablet (25 mg total) by mouth 3 (three) times daily as needed for dizziness.   METOPROLOL TARTRATE (LOPRESSOR) 25 MG TABLET    TAKE ONE TABLET BY MOUTH 2 TIMES A DAY   NITROGLYCERIN (NITROSTAT) 0.4 MG SL TABLET    Place 1 tablet (0.4 mg total) under the tongue every 5 (five) minutes as needed for chest pain.   PANTOPRAZOLE (PROTONIX) 40 MG TABLET    TAKE ONE TABLET BY MOUTH EVERY DAY   POTASSIUM CHLORIDE SA (K-DUR,KLOR-CON) 20 MEQ TABLET    TAKE ONE TABLET BY MOUTH EVERY DAY   SIMVASTATIN (ZOCOR) 20 MG TABLET    TAKE ONE TABLET BY MOUTH EVERY DAY   TRIAMCINOLONE OINTMENT (KENALOG) 0.5 %    APPLY 1 APPLICATION 2 TIMES A DAY    Review of Systems  All other systems reviewed and are negative.   Social History   Tobacco Use  . Smoking status: Former Smoker    Packs/day: 0.20    Years: 20.00    Pack years: 4.00    Types: Cigarettes    Last attempt to quit: 08/16/2017    Years since quitting: 0.9  . Smokeless tobacco: Never Used  . Tobacco comment: Decreased smoking to less than 5 cigs/day  Substance Use Topics  . Alcohol use: No   Objective:   There were no vitals taken for this visit.  Physical Exam  No PE    Assessment & Plan:         HTN:  Goal BP <140/90.  Continue current medication regimen.  Encourage low salt diet and exercise.    HLD:  Continue current regimen.  Encourage low cholesterol, low fat diet and exercise.   Labs in [redacted] weeks along with FU for cramps.     [facility]

## 2018-08-13 ENCOUNTER — Ambulatory Visit: Payer: Self-pay

## 2018-08-14 ENCOUNTER — Other Ambulatory Visit: Payer: Self-pay | Admitting: Urology

## 2018-08-14 ENCOUNTER — Other Ambulatory Visit: Payer: Self-pay | Admitting: Gerontology

## 2018-08-19 ENCOUNTER — Telehealth: Payer: Self-pay | Admitting: Pharmacy Technician

## 2018-08-19 NOTE — Telephone Encounter (Signed)
Received 2020 proof of income.  Patient eligible to receive medication assistance at [facility] as long as eligibility requirements continue to be met.  [facility]

## 2018-09-04 ENCOUNTER — Other Ambulatory Visit: Payer: Self-pay

## 2018-09-04 DIAGNOSIS — I10 Essential (primary) hypertension: Secondary | ICD-10-CM

## 2018-09-04 DIAGNOSIS — E782 Mixed hyperlipidemia: Secondary | ICD-10-CM

## 2018-09-04 DIAGNOSIS — R7303 Prediabetes: Secondary | ICD-10-CM

## 2018-09-05 LAB — COMPREHENSIVE METABOLIC PANEL
ALT: 38 IU/L — ABNORMAL HIGH (ref 0–32)
AST: 29 IU/L (ref 0–40)
Albumin/Globulin Ratio: 1.6 (ref 1.2–2.2)
Albumin: 4.2 g/dL (ref 3.8–4.9)
Alkaline Phosphatase: 91 IU/L (ref 39–117)
BUN/Creatinine Ratio: 10 (ref 9–23)
BUN: 8 mg/dL (ref 6–24)
Bilirubin Total: 0.3 mg/dL (ref 0.0–1.2)
CO2: 21 mmol/L (ref 20–29)
Calcium: 9.2 mg/dL (ref 8.7–10.2)
Chloride: 104 mmol/L (ref 96–106)
Creatinine, Ser: 0.79 mg/dL (ref 0.57–1.00)
GFR calc Af Amer: 100 mL/min/{1.73_m2} (ref >59)
GFR calc non Af Amer: 86 mL/min/{1.73_m2} (ref >59)
Globulin, Total: 2.6 g/dL (ref 1.5–4.5)
Glucose: 114 mg/dL — ABNORMAL HIGH (ref 65–99)
Potassium: 4.7 mmol/L (ref 3.5–5.2)
Sodium: 141 mmol/L (ref 134–144)
Total Protein: 6.8 g/dL (ref 6.0–8.5)

## 2018-09-05 LAB — LIPID PANEL
Chol/HDL Ratio: 4.7 ratio — ABNORMAL HIGH (ref 0.0–4.4)
Cholesterol, Total: 146 mg/dL (ref 100–199)
HDL: 31 mg/dL — ABNORMAL LOW (ref >39)
LDL Calculated: 85 mg/dL (ref 0–99)
Triglycerides: 151 mg/dL — ABNORMAL HIGH (ref 0–149)
VLDL Cholesterol Cal: 30 mg/dL (ref 5–40)

## 2018-09-05 LAB — CBC
Hematocrit: 42.1 % (ref 34.0–46.6)
Hemoglobin: 13.8 g/dL (ref 11.1–15.9)
MCH: 28.2 pg (ref 26.6–33.0)
MCHC: 32.8 g/dL (ref 31.5–35.7)
MCV: 86 fL (ref 79–97)
Platelets: 253 10*3/uL (ref 150–450)
RBC: 4.89 x10E6/uL (ref 3.77–5.28)
RDW: 14.2 % (ref 11.7–15.4)
WBC: 6.2 10*3/uL (ref 3.4–10.8)

## 2018-09-05 LAB — HEMOGLOBIN A1C
Est. average glucose Bld gHb Est-mCnc: 134 mg/dL
Hgb A1c MFr Bld: 6.3 % — ABNORMAL HIGH (ref 4.8–5.6)

## 2018-09-12 ENCOUNTER — Other Ambulatory Visit: Payer: Self-pay

## 2018-09-12 ENCOUNTER — Ambulatory Visit: Payer: Self-pay | Admitting: Urology

## 2018-09-12 ENCOUNTER — Encounter: Payer: Self-pay | Admitting: Urology

## 2018-09-12 DIAGNOSIS — R7303 Prediabetes: Secondary | ICD-10-CM

## 2018-09-12 DIAGNOSIS — R600 Localized edema: Secondary | ICD-10-CM

## 2018-09-12 DIAGNOSIS — I2510 Atherosclerotic heart disease of native coronary artery without angina pectoris: Secondary | ICD-10-CM

## 2018-09-12 NOTE — Progress Notes (Signed)
Virtual Visit via telephone  I connected with Kathy [name] on 09/12/18 at  6:45 PM EDT by a video enabled telemedicine application and verified that I am speaking with the correct person using two identifiers.  Location: Patient: home Provider: [facility]    I discussed the limitations of evaluation and management by telemedicine and the availability of in person appointments. The patient expressed understanding and agreed to proceed.  History of Present Illness: Kathy [name] is a 53 year old female with pre-diabetes, HLD, HTN, hx of MI, LBBB and obesity who is contacted today via [email or web address] due to COVID-19 pandemic to review labs.  Left leg seems to be worse.  She states that she has had swelling in both of her legs which goes down when she lays down x 2 weeks.   She feels she has not been eating very healthy during the stay-at-home order.   No exercise.  Has been standing more than usual as she has been cooking.   She states swelling happens when she sits for a long time or standing for a long time.  There is no swelling when she is moving around.    She is taking Lasix 40 mg daily.  She is urinating a lot.  No SOB.   No unusual CP.   She has gained 40lbs since October 2019.     Observations/Objective: She does not sound distressed and answers questions appropriately.    Assessment and Plan:  1. Pedal Edema On 40 mg Lasix daily Encouraged patient to take breaks when having to sit or stand for long periods of time and increase walking Instructed patient to lay recumbent with legs elevated above her heart    2. Prediabetes Encouraged patient to eat healthier - avoid sodium and more vegetables   3. CAD Keep appointment with Dr. Fletcher Anon  Follow Up Instructions:  Kathy [name] will implement behavioral changes and will have an appointment in two weeks.     I discussed the assessment and treatment plan with the patient. The patient was provided an opportunity to ask questions and all  were answered. The patient agreed with the plan and demonstrated an understanding of the instructions.   The patient was advised to call back or seek an in-person evaluation if the symptoms worsen or if the condition fails to improve as anticipated.  I provided 30 minutes of non-face-to-face time during this encounter.   Shawnelle Spoerl, PA-C

## 2018-09-26 ENCOUNTER — Ambulatory Visit: Payer: Self-pay | Admitting: Adult Health Nurse Practitioner

## 2018-09-26 ENCOUNTER — Other Ambulatory Visit: Payer: Self-pay

## 2018-09-26 DIAGNOSIS — R7303 Prediabetes: Secondary | ICD-10-CM

## 2018-09-26 DIAGNOSIS — I2510 Atherosclerotic heart disease of native coronary artery without angina pectoris: Secondary | ICD-10-CM

## 2018-09-26 NOTE — Progress Notes (Addendum)
  Patient: Kathy [name] Female    DOB: [birth year]   53 y.o.   MRN: [id number] Visit Date: 09/26/2018  Today's Provider: [facility]   No chief complaint on file.  Subjective:    HPI   Telephonic visit.   Seen two weeks ago for pedal edema- more in the left than the right due to previous surgery. No changes to POC at that time. Continue on Lasix 40mg  daily.  States that her diet choices have been poor due to COVID.      Allergies  Allergen Reactions  . Aspirin Anaphylaxis  . Celebrex [Celecoxib] Anaphylaxis and Hives  . Fentanyl Anaphylaxis  . Latex Hives  . Lisinopril Shortness Of Breath  . Percocet [Oxycodone-Acetaminophen] Shortness Of Breath  . Simvastatin-High Dose Hives and Shortness Of Breath    Can tolerate 20 mg dose.   Jonah Blue [Metaxalone] Anaphylaxis  . Morphine Swelling  . Vicodin [Hydrocodone-Acetaminophen] Hives and Other (See Comments)    Feels like her tongue swells  . Albuterol Palpitations   Previous Medications   CLOPIDOGREL (PLAVIX) 75 MG TABLET    Take 1 tablet (75 mg total) by mouth daily.   FUROSEMIDE (LASIX) 40 MG TABLET    Take 1 tablet (40 mg total) by mouth daily.   ISOSORBIDE MONONITRATE (IMDUR) 60 MG 24 HR TABLET    TAKE ONE TABLET BY MOUTH 2 TIMES A DAY   MECLIZINE (ANTIVERT) 25 MG TABLET    Take 1 tablet (25 mg total) by mouth 3 (three) times daily as needed for dizziness.   METOPROLOL TARTRATE (LOPRESSOR) 25 MG TABLET    TAKE ONE TABLET BY MOUTH 2 TIMES A DAY   NITROGLYCERIN (NITROSTAT) 0.4 MG SL TABLET    Place 1 tablet (0.4 mg total) under the tongue every 5 (five) minutes as needed for chest pain.   PANTOPRAZOLE (PROTONIX) 40 MG TABLET    TAKE ONE TABLET BY MOUTH EVERY DAY   POTASSIUM CHLORIDE SA (K-DUR) 20 MEQ TABLET    Take 1 tablet (20 mEq total) by mouth daily.   SIMVASTATIN (ZOCOR) 20 MG TABLET    Take 1 tablet (20 mg total) by mouth daily.   TRIAMCINOLONE OINTMENT (KENALOG) 0.5 %    APPLY 1 APPLICATION 2 TIMES A DAY     Review of Systems  All other systems reviewed and are negative.   Social History   Tobacco Use  . Smoking status: Former Smoker    Packs/day: 0.20    Years: 20.00    Pack years: 4.00    Types: Cigarettes    Quit date: 08/16/2017    Years since quitting: 1.1  . Smokeless tobacco: Never Used  . Tobacco comment: Decreased smoking to less than 5 cigs/day  Substance Use Topics  . Alcohol use: No   Objective:   There were no vitals taken for this visit.  Physical Exam  No PE.     Assessment & Plan:        Pedal edema:  Will continue current Lasix.  Encourage healthy lifestyle changes- decrease sodium intake.  Discussed elevation of LE.   Discussed FU with Dr. Fletcher Anon in July.      [facility]

## 2018-10-14 ENCOUNTER — Telehealth: Payer: Self-pay

## 2018-10-14 NOTE — Telephone Encounter (Signed)

## 2018-10-15 ENCOUNTER — Encounter: Payer: Self-pay | Admitting: Cardiovascular Disease

## 2018-10-15 ENCOUNTER — Ambulatory Visit (INDEPENDENT_AMBULATORY_CARE_PROVIDER_SITE_OTHER): Payer: Self-pay | Admitting: Cardiovascular Disease

## 2018-10-15 ENCOUNTER — Other Ambulatory Visit: Payer: Self-pay

## 2018-10-15 VITALS — BP 128/78 | HR 84 | Ht 67.0 in | Wt 303.8 lb

## 2018-10-15 DIAGNOSIS — E785 Hyperlipidemia, unspecified: Secondary | ICD-10-CM

## 2018-10-15 DIAGNOSIS — I25118 Atherosclerotic heart disease of native coronary artery with other forms of angina pectoris: Secondary | ICD-10-CM

## 2018-10-15 DIAGNOSIS — I10 Essential (primary) hypertension: Secondary | ICD-10-CM

## 2018-10-15 NOTE — Patient Instructions (Signed)
Medication Instructions:  - Your physician recommends that you continue on your current medications as directed. Please refer to the Current Medication list given to you today.  If you need a refill on your cardiac medications before your next appointment, please call your pharmacy.   Lab work: - none ordered  If you have labs (blood work) drawn today and your tests are completely normal, you will receive your results only by: Marland Kitchen MyChart Message (if you have MyChart) OR . A paper copy in the mail If you have any lab test that is abnormal or we need to change your treatment, we will call you to review the results.  Testing/Procedures: - none ordered  Follow-Up: At [facility], you and your health needs are our priority.  As part of our continuing mission to provide you with exceptional heart care, we have created designated Provider Care Teams.  These Care Teams include your primary Cardiologist (physician) and Advanced Practice Providers (APPs -  Physician Assistants and Nurse Practitioners) who all work together to provide you with the care you need, when you need it. You will need a follow up appointment in 6 months (January).   Please call our office 2 months in advance to schedule this appointment (call in early November to schedule).    You may see [name], MD or one of the following Advanced Practice Providers on your designated Care Team:   Murray Hodgkins, NP Christell Faith, PA-C . Marrianne Mood, PA-C  Any Other Special Instructions Will Be Listed Below (If Applicable). - N/A

## 2018-10-15 NOTE — Progress Notes (Signed)
ekg 

## 2018-10-15 NOTE — Progress Notes (Signed)
Cardiology Office Note   Date:  10/15/2018   ID:  Tayloranne, Lekas [birth year], MRN [id number]  PCP:  System, Pcp Not In  Cardiologist:   [name], MD   Chief Complaint  Patient presents with  . other    Overdue 6 mo F/U. Medications reviewed verbally.       History of Present Illness: Kapri Nero is a 52 y.o. female who is here today for follow-up visit regarding chronic chest pain.    The patient has known history of recurrent chest pain usually responsive to nitroglycerin which is thought to be due to coronary spasm or endothelial dysfunction.    She had cardiac catheterization done 4 times in the past in 2007, 2011, 2012 and most recently in December 2017 . All these showed mild nonobstructive coronary artery disease.  She is known to have a rate dependent left bundle branch block.  She is allergic to aspirin and thus on long-term Plavix.  Other medical problems include previous tobacco use, obesity, chronic back pain and hyperlipidemia.  She has family history of coronary artery disease. We tried her on Ranexa last year with significant improvement in symptoms.  However, she could not continue the medication due to cost.  She has been doing reasonably well with stable symptoms overall.  Unfortunately, she gained about 30 pounds since she quit smoking.  Past Medical History:  Diagnosis Date  . Asthma   . CHF (congestive heart failure) ([place])   . Coronary artery disease   . Heart murmur   . Hyperlipidemia   . Hypertension   . Myocardial infarction ([place])   . Obesity   . Seizures ([place])     Past Surgical History:  Procedure Laterality Date  . ANTERIOR CRUCIATE LIGAMENT REPAIR    . BACK SURGERY     L4 and L5  . CARDIAC CATHETERIZATION  04/09/2016  . TONSILLECTOMY AND ADENOIDECTOMY  2008  . TUBAL LIGATION       Current Outpatient Medications  Medication Sig Dispense Refill  . clopidogrel (PLAVIX) 75 MG tablet Take 1 tablet (75 mg total) by mouth daily. 90 tablet  1  . furosemide (LASIX) 40 MG tablet Take 1 tablet (40 mg total) by mouth daily. 30 tablet 3  . isosorbide mononitrate (IMDUR) 60 MG 24 hr tablet TAKE ONE TABLET BY MOUTH 2 TIMES A DAY 180 tablet 1  . meclizine (ANTIVERT) 25 MG tablet Take 1 tablet (25 mg total) by mouth 3 (three) times daily as needed for dizziness. 90 tablet 6  . metoprolol tartrate (LOPRESSOR) 25 MG tablet TAKE ONE TABLET BY MOUTH 2 TIMES A DAY 180 tablet 1  . nitroGLYCERIN (NITROSTAT) 0.4 MG SL tablet Place 1 tablet (0.4 mg total) under the tongue every 5 (five) minutes as needed for chest pain. 30 tablet 6  . pantoprazole (PROTONIX) 40 MG tablet TAKE ONE TABLET BY MOUTH EVERY DAY 90 tablet 0  . potassium chloride SA (K-DUR) 20 MEQ tablet Take 1 tablet (20 mEq total) by mouth daily. 90 tablet 1  . simvastatin (ZOCOR) 20 MG tablet Take 1 tablet (20 mg total) by mouth daily. 90 tablet 1  . triamcinolone ointment (KENALOG) 0.5 % APPLY 1 APPLICATION 2 TIMES A DAY 15 g 0   Current Facility-Administered Medications  Medication Dose Route Frequency Provider Last Rate Last Dose  . EPINEPHrine (EPI-PEN) injection 0.3 mg  0.3 mg Intramuscular Once PRN Hugelmeyer, Alexis, DO        Allergies:   Aspirin,  Celebrex [celecoxib], Fentanyl, Latex, Lisinopril, Percocet [oxycodone-acetaminophen], Simvastatin-high dose, Skelaxin [metaxalone], Morphine, Vicodin [hydrocodone-acetaminophen], and Albuterol    Social History:  The patient  reports that she quit smoking about 13 months ago. Her smoking use included cigarettes. She has a 4.00 pack-year smoking history. She has never used smokeless tobacco. She reports that she does not drink alcohol or use drugs.   Family History:  The patient's family history includes Asthma in her daughter and son; COPD in her sister; Diabetes in her mother and sister; Heart attack in her father and mother; Kidney disease in her sister; Stroke in her father, mother, and sister.    ROS:  Please see the history of  present illness.   Otherwise, review of systems are positive for none.   All other systems are reviewed and negative.    PHYSICAL EXAM: VS:  BP 128/78 (BP Location: Left Arm, Patient Position: Sitting, Cuff Size: Normal)   Pulse 84   Ht 5\' 7"  (1.702 m)   Wt (!) 303 lb 12.8 oz (137.8 kg)   BMI 47.58 kg/m  , BMI Body mass index is 47.58 kg/m. GEN: Well nourished, well developed, in no acute distress  HEENT: normal  Neck: no JVD, carotid bruits, or masses Cardiac: RRR; no  rubs, or gallops,no edema .  1/ 6 systolic murmur in the aortic area Respiratory:  clear to auscultation bilaterally, normal work of breathing GI: soft, nontender, nondistended, + BS MS: no deformity or atrophy  Skin: warm and dry, no rash Neuro:  Strength and sensation are intact Psych: euthymic mood, full affect   EKG:  EKG is ordered today. The ekg ordered today demonstrates normal sinus rhythm with no significant ST or T wave changes.   Recent Labs: 09/04/2018: ALT 38; BUN 8; Creatinine, Ser 0.79; Hemoglobin 13.8; Platelets 253; Potassium 4.7; Sodium 141    Lipid Panel    Component Value Date/Time   CHOL 146 09/04/2018 1159   TRIG 151 (H) 09/04/2018 1159   HDL 31 (L) 09/04/2018 1159   CHOLHDL 4.7 (H) 09/04/2018 1159   CHOLHDL 5.7 02/24/2016 0618   VLDL 30 02/24/2016 0618   LDLCALC 85 09/04/2018 1159      Wt Readings from Last 3 Encounters:  10/15/18 (!) 303 lb 12.8 oz (137.8 kg)  02/12/18 298 lb 1.6 oz (135.2 kg)  12/06/17 277 lb 12 oz (126 kg)       PAD Screen 02/23/2017  Previous PAD dx? No  Previous surgical procedure? No  Pain with walking? Yes  Subsides with rest? No  Feet/toe relief with dangling? No  Painful, non-healing ulcers? No  Extremities discolored? No      ASSESSMENT AND PLAN:  1.  Mild coronary artery disease with other forms of angina:  She has chronic angina thought to be due to coronary spasm or endothelial dysfunction.  Significant improvement in symptoms with  Ranexa but she could not continue the medication due to cost.  Continue antianginal therapy with Imdur and metoprolol.    2.  Cardiac murmur: Seems to be physiologic.  Echocardiogram showed no significant valvular abnormalities.  3.  Hyperlipidemia: Continue treatment with simvastatin.  Most recent LDL was 85.  4.  Previous tobacco use: No relapse.  5.  Significant weight gain since quitting smoking, I discussed with her the importance of increasing her physical activities and monitoring her diet more carefully.  She does have a swimming pool and will start swimming soon.    Disposition:   FU with me  in 6 months  Signed,  [name], MD  10/15/2018 4:45 PM    [facility]

## 2018-11-12 ENCOUNTER — Other Ambulatory Visit: Payer: Self-pay | Admitting: Adult Health Nurse Practitioner

## 2018-11-12 DIAGNOSIS — R601 Generalized edema: Secondary | ICD-10-CM

## 2018-11-12 DIAGNOSIS — I10 Essential (primary) hypertension: Secondary | ICD-10-CM

## 2018-12-05 ENCOUNTER — Other Ambulatory Visit: Payer: Self-pay | Admitting: Gerontology

## 2018-12-05 DIAGNOSIS — Z0000 Encounter for general adult medical examination without abnormal findings: Secondary | ICD-10-CM

## 2019-01-22 ENCOUNTER — Other Ambulatory Visit: Payer: Self-pay

## 2019-01-22 DIAGNOSIS — Z0000 Encounter for general adult medical examination without abnormal findings: Secondary | ICD-10-CM

## 2019-01-22 DIAGNOSIS — R7303 Prediabetes: Secondary | ICD-10-CM

## 2019-01-22 DIAGNOSIS — I2510 Atherosclerotic heart disease of native coronary artery without angina pectoris: Secondary | ICD-10-CM

## 2019-01-23 ENCOUNTER — Other Ambulatory Visit: Payer: Self-pay

## 2019-01-23 LAB — COMPREHENSIVE METABOLIC PANEL
ALT: 24 IU/L (ref 0–32)
AST: 21 IU/L (ref 0–40)
Albumin/Globulin Ratio: 1 — ABNORMAL LOW (ref 1.2–2.2)
Albumin: 3.4 g/dL — ABNORMAL LOW (ref 3.8–4.9)
Alkaline Phosphatase: 95 IU/L (ref 39–117)
BUN/Creatinine Ratio: 10 (ref 9–23)
BUN: 8 mg/dL (ref 6–24)
Bilirubin Total: 0.3 mg/dL (ref 0.0–1.2)
CO2: 23 mmol/L (ref 20–29)
Calcium: 9.2 mg/dL (ref 8.7–10.2)
Chloride: 104 mmol/L (ref 96–106)
Creatinine, Ser: 0.82 mg/dL (ref 0.57–1.00)
GFR calc Af Amer: 95 mL/min/{1.73_m2} (ref >59)
GFR calc non Af Amer: 83 mL/min/{1.73_m2} (ref >59)
Globulin, Total: 3.4 g/dL (ref 1.5–4.5)
Glucose: 113 mg/dL — ABNORMAL HIGH (ref 65–99)
Potassium: 4.4 mmol/L (ref 3.5–5.2)
Sodium: 140 mmol/L (ref 134–144)
Total Protein: 6.8 g/dL (ref 6.0–8.5)

## 2019-01-23 LAB — HEMOGLOBIN A1C
Est. average glucose Bld gHb Est-mCnc: 131 mg/dL
Hgb A1c MFr Bld: 6.2 % — ABNORMAL HIGH (ref 4.8–5.6)

## 2019-01-23 LAB — LIPID PANEL
Chol/HDL Ratio: 5.5 ratio — ABNORMAL HIGH (ref 0.0–4.4)
Cholesterol, Total: 149 mg/dL (ref 100–199)
HDL: 27 mg/dL — ABNORMAL LOW (ref >39)
LDL Chol Calc (NIH): 93 mg/dL (ref 0–99)
Triglycerides: 167 mg/dL — ABNORMAL HIGH (ref 0–149)
VLDL Cholesterol Cal: 29 mg/dL (ref 5–40)

## 2019-01-23 LAB — MICROALBUMIN / CREATININE URINE RATIO
Creatinine, Urine: 161.9 mg/dL
Microalb/Creat Ratio: 2 mg/g creat (ref 0–29)
Microalbumin, Urine: 3.1 ug/mL

## 2019-01-30 ENCOUNTER — Ambulatory Visit: Payer: Self-pay | Admitting: Family Medicine

## 2019-01-30 ENCOUNTER — Other Ambulatory Visit: Payer: Self-pay

## 2019-01-30 VITALS — BP 124/82 | HR 96 | Temp 96.8°F | Ht 67.0 in | Wt 301.1 lb

## 2019-01-30 DIAGNOSIS — L309 Dermatitis, unspecified: Secondary | ICD-10-CM

## 2019-01-30 DIAGNOSIS — M25532 Pain in left wrist: Secondary | ICD-10-CM

## 2019-01-30 DIAGNOSIS — M25531 Pain in right wrist: Secondary | ICD-10-CM

## 2019-01-30 MED ORDER — TRIAMCINOLONE ACETONIDE 0.5 % EX OINT: TOPICAL_OINTMENT | CUTANEOUS | 0 refills | Status: DC

## 2019-01-30 MED ORDER — METHYLPREDNISOLONE 4 MG PO TBPK: ORAL_TABLET | ORAL | 0 refills | Status: DC

## 2019-01-30 NOTE — Progress Notes (Signed)
Established Patient Office Visit  Subjective:  Patient ID: Kathy [name], female    DOB: 02-Feb-1966  Age: 53 y.o. MRN: [id number]  CC:  Chief Complaint  Patient presents with  . Follow-up  . Rash    Been going on since May, takes Bendadryl 2x a day 2 tablets/time. Dries up but then reoccurs  . Peripheral Neuropathy    Loses feeling in hands and wrists, unable to turn knobs on doors, both wrists swollen, getting hard to dress herself. Pain in wrist down to hand, not up into forearm.     HPI Kathy [name] presents for follow up appointment. She reports that she has a history of eczema and has a rash on her bilateral arms and chest. She reports that she has used triamcinolone ointment without relief. She states that the rash will itch some times. Reports picking the areas and has dry skin. She denies other people in her household having a similar rash. No new cosemetics, bedding or furniture.  Also states that she is having bilateral wrist pain that is worsening. She reports a history of having a job where she had to do  repetitive motions. She reports that she has dropped objects due to neuropathy. She denies testing for carpal tunnel.   Past Medical History:  Diagnosis Date  . Asthma   . CHF (congestive heart failure) ([place])   . Coronary artery disease   . Heart murmur   . Hyperlipidemia   . Hypertension   . Myocardial infarction ([place])   . Obesity   . Seizures ([place])     Past Surgical History:  Procedure Laterality Date  . ANTERIOR CRUCIATE LIGAMENT REPAIR    . BACK SURGERY     L4 and L5  . CARDIAC CATHETERIZATION  04/09/2016  . TONSILLECTOMY AND ADENOIDECTOMY  2008  . TUBAL LIGATION      Family History  Problem Relation Age of Onset  . Kidney disease Sister   . Diabetes Sister   . COPD Sister   . Stroke Sister   . Diabetes Mother   . Stroke Mother   . Heart attack Mother   . Stroke Father   . Heart attack Father   . Asthma Daughter   . Asthma Son     Social  History   Socioeconomic History  . Marital status: Divorced    Spouse name: Not on file  . Number of children: 2  . Years of education: pre-requisites   . Highest education level: GED or equivalent  Occupational History  . Occupation: unemployed  Social Needs  . Financial resource strain: Hard  . Food insecurity    Worry: Often true    Inability: Often true  . Transportation needs    Medical: No    Non-medical: No  Tobacco Use  . Smoking status: Former Smoker    Packs/day: 0.20    Years: 20.00    Pack years: 4.00    Types: Cigarettes    Quit date: 08/16/2017    Years since quitting: 1.4  . Smokeless tobacco: Never Used  . Tobacco comment: Decreased smoking to less than 5 cigs/day  Substance and Sexual Activity  . Alcohol use: No  . Drug use: No  . Sexual activity: Yes    Birth control/protection: Condom  Lifestyle  . Physical activity    Days per week: 2 days    Minutes per session: 100 min  . Stress: To some extent  Relationships  . Social connections  Talks on phone: More than three times a week    Gets together: Once a week    Attends religious service: Never    Active member of club or organization: No    Attends meetings of clubs or organizations: Never    Relationship status: Divorced  . Intimate partner violence    Fear of current or ex partner: No    Emotionally abused: No    Physically abused: No    Forced sexual activity: No  Other Topics Concern  . Not on file  Social History Narrative   Currently on food stamps. They help somewhat but still difficult to buy food that she needs. Boyfriend helps pay rent with disability, they live together. Roof is leaking really badly. Lives in modular home. Social services bought tarp which helped, but it deteriorated from heat.     Outpatient Medications Prior to Visit  Medication Sig Dispense Refill  . clopidogrel (PLAVIX) 75 MG tablet Take 1 tablet (75 mg total) by mouth daily. 90 tablet 1  . furosemide  (LASIX) 40 MG tablet TAKE ONE TABLET BY MOUTH EVERY DAY 90 tablet 0  . isosorbide mononitrate (IMDUR) 60 MG 24 hr tablet TAKE ONE TABLET BY MOUTH 2 TIMES A DAY 180 tablet 1  . meclizine (ANTIVERT) 25 MG tablet Take 1 tablet (25 mg total) by mouth 3 (three) times daily as needed for dizziness. 90 tablet 6  . metoprolol tartrate (LOPRESSOR) 25 MG tablet TAKE ONE TABLET BY MOUTH 2 TIMES A DAY 180 tablet 1  . nitroGLYCERIN (NITROSTAT) 0.4 MG SL tablet Place 1 tablet (0.4 mg total) under the tongue every 5 (five) minutes as needed for chest pain. 30 tablet 6  . pantoprazole (PROTONIX) 40 MG tablet TAKE ONE TABLET BY MOUTH EVERY DAY 90 tablet 0  . potassium chloride SA (K-DUR) 20 MEQ tablet Take 1 tablet (20 mEq total) by mouth daily. 90 tablet 1  . simvastatin (ZOCOR) 20 MG tablet Take 1 tablet (20 mg total) by mouth daily. 90 tablet 1  . triamcinolone ointment (KENALOG) 0.5 % APPLY 1 APPLICATION 2 TIMES A DAY (Patient not taking: Reported on 01/30/2019) 15 g 0   Facility-Administered Medications Prior to Visit  Medication Dose Route Frequency Provider Last Rate Last Dose  . EPINEPHrine (EPI-PEN) injection 0.3 mg  0.3 mg Intramuscular Once PRN Hugelmeyer, Alexis, DO        Allergies  Allergen Reactions  . Aspirin Anaphylaxis  . Celebrex [Celecoxib] Anaphylaxis and Hives  . Fentanyl Anaphylaxis  . Latex Hives  . Lisinopril Shortness Of Breath  . Percocet [Oxycodone-Acetaminophen] Shortness Of Breath  . Simvastatin-High Dose Hives and Shortness Of Breath    Can tolerate 20 mg dose.   Jonah Blue [Metaxalone] Anaphylaxis  . Morphine Swelling  . Vicodin [Hydrocodone-Acetaminophen] Hives and Other (See Comments)    Feels like her tongue swells  . Albuterol Palpitations    ROS Review of Systems    Objective:    Physical Exam  BP 124/82   Pulse 96   Temp (!) 96.8 F (36 C)   Ht 5\' 7"  (1.702 m)   Wt (!) 301 lb 1.6 oz (136.6 kg)   SpO2 97%   BMI 47.16 kg/m  Wt Readings from Last 3  Encounters:  01/30/19 (!) 301 lb 1.6 oz (136.6 kg)  10/15/18 (!) 303 lb 12.8 oz (137.8 kg)  02/12/18 298 lb 1.6 oz (135.2 kg)     Health Maintenance Due  Topic Date Due  .  PNEUMOCOCCAL POLYSACCHARIDE VACCINE AGE 72-64 HIGH RISK  05/31/1968  . FOOT EXAM  05/31/1976  . OPHTHALMOLOGY EXAM  05/31/1976  . HIV Screening  05/31/1981  . TETANUS/TDAP  05/31/1985  . PAP SMEAR-Modifier  06/01/1987  . MAMMOGRAM  05/31/2016  . COLONOSCOPY  05/31/2016    There are no preventive care reminders to display for this patient.  Lab Results  Component Value Date   TSH 1.770 10/04/2017   Lab Results  Component Value Date   WBC 6.2 09/04/2018   HGB 13.8 09/04/2018   HCT 42.1 09/04/2018   MCV 86 09/04/2018   PLT 253 09/04/2018   Lab Results  Component Value Date   NA 140 01/22/2019   K 4.4 01/22/2019   CO2 23 01/22/2019   GLUCOSE 113 (H) 01/22/2019   BUN 8 01/22/2019   CREATININE 0.82 01/22/2019   BILITOT 0.3 01/22/2019   ALKPHOS 95 01/22/2019   AST 21 01/22/2019   ALT 24 01/22/2019   PROT 6.8 01/22/2019   ALBUMIN 3.4 (L) 01/22/2019   CALCIUM 9.2 01/22/2019   ANIONGAP 8 04/09/2016   Lab Results  Component Value Date   CHOL 149 01/22/2019   Lab Results  Component Value Date   HDL 27 (L) 01/22/2019   Lab Results  Component Value Date   LDLCALC 93 01/22/2019   Lab Results  Component Value Date   TRIG 167 (H) 01/22/2019   Lab Results  Component Value Date   CHOLHDL 5.5 (H) 01/22/2019   Lab Results  Component Value Date   HGBA1C 6.2 (H) 01/22/2019      Assessment & Plan:   Problem List Items Addressed This Visit    None    Visit Diagnoses    Dermatitis    -  Primary   Relevant Medications   triamcinolone ointment (KENALOG) 0.5 %   methylPREDNISolone (MEDROL DOSEPAK) 4 MG TBPK tablet   Other Relevant Orders   ANA,IFA RA Diag Pnl w/rflx Tit/Patn   Pain in both wrists       Relevant Orders   Nerve conduction test      Meds ordered this encounter   Medications  . triamcinolone ointment (KENALOG) 0.5 %    Sig: APPLY 1 APPLICATION 2 TIMES A DAY    Dispense:  80 g    Refill:  0  . methylPREDNISolone (MEDROL DOSEPAK) 4 MG TBPK tablet    Sig: Take as directed on package.    Dispense:  21 tablet    Refill:  0   ANA ordered to rule out autoimmune disorders. Nerve conduction study ordered for further evaluation of wrist pain.   Follow-up: Return in about 2 weeks (around 02/13/2019).    Lanae Boast, FNP

## 2019-01-30 NOTE — Patient Instructions (Signed)
Health Maintenance, Female Adopting a healthy lifestyle and getting preventive care are important in promoting health and wellness. Ask your health care provider about:  The right schedule for you to have regular tests and exams.  Things you can do on your own to prevent diseases and keep yourself healthy. What should I know about diet, weight, and exercise? Eat a healthy diet   Eat a diet that includes plenty of vegetables, fruits, low-fat dairy products, and lean protein.  Do not eat a lot of foods that are high in solid fats, added sugars, or sodium. Maintain a healthy weight Body mass index (BMI) is used to identify weight problems. It estimates body fat based on height and weight. Your health care provider can help determine your BMI and help you achieve or maintain a healthy weight. Get regular exercise Get regular exercise. This is one of the most important things you can do for your health. Most adults should:  Exercise for at least 150 minutes each week. The exercise should increase your heart rate and make you sweat (moderate-intensity exercise).  Do strengthening exercises at least twice a week. This is in addition to the moderate-intensity exercise.  Spend less time sitting. Even light physical activity can be beneficial. Watch cholesterol and blood lipids Have your blood tested for lipids and cholesterol at 52 years of age, then have this test every 5 years. Have your cholesterol levels checked more often if:  Your lipid or cholesterol levels are high.  You are older than 52 years of age.  You are at high risk for heart disease. What should I know about cancer screening? Depending on your health history and family history, you may need to have cancer screening at various ages. This may include screening for:  Breast cancer.  Cervical cancer.  Colorectal cancer.  Skin cancer.  Lung cancer. What should I know about heart disease, diabetes, and high blood  pressure? Blood pressure and heart disease  High blood pressure causes heart disease and increases the risk of stroke. This is more likely to develop in people who have high blood pressure readings, are of African descent, or are overweight.  Have your blood pressure checked: ? Every 3-5 years if you are 18-39 years of age. ? Every year if you are 40 years old or older. Diabetes Have regular diabetes screenings. This checks your fasting blood sugar level. Have the screening done:  Once every three years after age 40 if you are at a normal weight and have a low risk for diabetes.  More often and at a younger age if you are overweight or have a high risk for diabetes. What should I know about preventing infection? Hepatitis B If you have a higher risk for hepatitis B, you should be screened for this virus. Talk with your health care provider to find out if you are at risk for hepatitis B infection. Hepatitis C Testing is recommended for:  Everyone born from 1945 through 1965.  Anyone with known risk factors for hepatitis C. Sexually transmitted infections (STIs)  Get screened for STIs, including gonorrhea and chlamydia, if: ? You are sexually active and are younger than 52 years of age. ? You are older than 52 years of age and your health care provider tells you that you are at risk for this type of infection. ? Your sexual activity has changed since you were last screened, and you are at increased risk for chlamydia or gonorrhea. Ask your health care provider if   you are at risk.  Ask your health care provider about whether you are at high risk for HIV. Your health care provider may recommend a prescription medicine to help prevent HIV infection. If you choose to take medicine to prevent HIV, you should first get tested for HIV. You should then be tested every 3 months for as long as you are taking the medicine. Pregnancy  If you are about to stop having your period (premenopausal) and  you may become pregnant, seek counseling before you get pregnant.  Take 400 to 800 micrograms (mcg) of folic acid every day if you become pregnant.  Ask for birth control (contraception) if you want to prevent pregnancy. Osteoporosis and menopause Osteoporosis is a disease in which the bones lose minerals and strength with aging. This can result in bone fractures. If you are 65 years old or older, or if you are at risk for osteoporosis and fractures, ask your health care provider if you should:  Be screened for bone loss.  Take a calcium or vitamin D supplement to lower your risk of fractures.  Be given hormone replacement therapy (HRT) to treat symptoms of menopause. Follow these instructions at home: Lifestyle  Do not use any products that contain nicotine or tobacco, such as cigarettes, e-cigarettes, and chewing tobacco. If you need help quitting, ask your health care provider.  Do not use street drugs.  Do not share needles.  Ask your health care provider for help if you need support or information about quitting drugs. Alcohol use  Do not drink alcohol if: ? Your health care provider tells you not to drink. ? You are pregnant, may be pregnant, or are planning to become pregnant.  If you drink alcohol: ? Limit how much you use to 0-1 drink a day. ? Limit intake if you are breastfeeding.  Be aware of how much alcohol is in your drink. In the [place] one drink equals one 12 oz bottle of beer (355 mL), one 5 oz glass of wine (148 mL), or one 1 oz glass of hard liquor (44 mL). General instructions  Schedule regular health, dental, and eye exams.  Stay current with your vaccines.  Tell your health care provider if: ? You often feel depressed. ? You have ever been abused or do not feel safe at home. Summary  Adopting a healthy lifestyle and getting preventive care are important in promoting health and wellness.  Follow your health care provider's instructions about healthy  diet, exercising, and getting tested or screened for diseases.  Follow your health care provider's instructions on monitoring your cholesterol and blood pressure. This information is not intended to replace advice given to you by your health care provider. Make sure you discuss any questions you have with your health care provider. Document Released: 10/10/2010 Document Revised: 03/20/2018 Document Reviewed: 03/20/2018 Elsevier Patient Education  2020 [employer]  

## 2019-02-04 LAB — FANA STAINING PATTERNS: Speckled Pattern: 1:80 {titer}

## 2019-02-04 LAB — ANA,IFA RA DIAG PNL W/RFLX TIT/PATN
ANA Titer 1: POSITIVE — AB
Cyclic Citrullin Peptide Ab: 7 units (ref 0–19)
Rheumatoid fact SerPl-aCnc: <10 IU/mL (ref 0.0–13.9)

## 2019-02-11 ENCOUNTER — Other Ambulatory Visit: Payer: Self-pay | Admitting: Gerontology

## 2019-02-11 ENCOUNTER — Other Ambulatory Visit: Payer: Self-pay | Admitting: Adult Health Nurse Practitioner

## 2019-02-11 DIAGNOSIS — I10 Essential (primary) hypertension: Secondary | ICD-10-CM

## 2019-02-13 ENCOUNTER — Other Ambulatory Visit: Payer: Self-pay

## 2019-02-13 ENCOUNTER — Ambulatory Visit: Payer: Self-pay | Admitting: Family Medicine

## 2019-02-13 VITALS — BP 209/113 | HR 82 | Temp 97.6°F | Ht 67.0 in | Wt 302.0 lb

## 2019-02-13 DIAGNOSIS — L309 Dermatitis, unspecified: Secondary | ICD-10-CM

## 2019-02-13 DIAGNOSIS — I10 Essential (primary) hypertension: Secondary | ICD-10-CM

## 2019-02-13 DIAGNOSIS — I252 Old myocardial infarction: Secondary | ICD-10-CM

## 2019-02-13 DIAGNOSIS — R600 Localized edema: Secondary | ICD-10-CM

## 2019-02-13 DIAGNOSIS — Z720 Tobacco use: Secondary | ICD-10-CM

## 2019-02-13 NOTE — Progress Notes (Signed)
Established Patient Office Visit  Subjective:  Patient ID: Kathy [name], female    DOB: 01-12-1966  Age: 53 y.o. MRN: [id number]  CC:  Chief Complaint  Patient presents with  . Follow-up    retaining fluid lower right leg  . Follow-up    HPI Kathy [name] presents with bilateral lower extremity edema x 3 days. Patient reports going out of town and eating fast food for the past few days. She reports that when she came home, both of her lower extremities were swollen. She states that she has taken her regular dose of 40 mg lasix without relief. She denies chest pain, SOB or dizziness. Blood pressure repeated and was 180/90.   Past Medical History:  Diagnosis Date  . Asthma   . CHF (congestive heart failure) ([place])   . Coronary artery disease   . Heart murmur   . Hyperlipidemia   . Hypertension   . Myocardial infarction ([place])   . Obesity   . Seizures ([place])     Past Surgical History:  Procedure Laterality Date  . ANTERIOR CRUCIATE LIGAMENT REPAIR    . BACK SURGERY     L4 and L5  . CARDIAC CATHETERIZATION  04/09/2016  . TONSILLECTOMY AND ADENOIDECTOMY  2008  . TUBAL LIGATION      Family History  Problem Relation Age of Onset  . Kidney disease Sister   . Diabetes Sister   . COPD Sister   . Stroke Sister   . Diabetes Mother   . Stroke Mother   . Heart attack Mother   . Stroke Father   . Heart attack Father   . Asthma Daughter   . Asthma Son     Social History   Socioeconomic History  . Marital status: Divorced    Spouse name: Not on file  . Number of children: 2  . Years of education: pre-requisites   . Highest education level: GED or equivalent  Occupational History  . Occupation: unemployed  Social Needs  . Financial resource strain: Hard  . Food insecurity    Worry: Often true    Inability: Often true  . Transportation needs    Medical: No    Non-medical: No  Tobacco Use  . Smoking status: Former Smoker    Packs/day: 0.20    Years: 20.00   Pack years: 4.00    Types: Cigarettes    Quit date: 08/16/2017    Years since quitting: 1.4  . Smokeless tobacco: Never Used  . Tobacco comment: Decreased smoking to less than 5 cigs/day  Substance and Sexual Activity  . Alcohol use: No  . Drug use: No  . Sexual activity: Yes    Birth control/protection: Condom  Lifestyle  . Physical activity    Days per week: 2 days    Minutes per session: 100 min  . Stress: To some extent  Relationships  . Social connections    Talks on phone: More than three times a week    Gets together: Once a week    Attends religious service: Never    Active member of club or organization: No    Attends meetings of clubs or organizations: Never    Relationship status: Divorced  . Intimate partner violence    Fear of current or ex partner: No    Emotionally abused: No    Physically abused: No    Forced sexual activity: No  Other Topics Concern  . Not on file  Social History Narrative  Currently on food stamps. They help somewhat but still difficult to buy food that she needs. Boyfriend helps pay rent with disability, they live together. Roof is leaking really badly. Lives in modular home. Social services bought tarp which helped, but it deteriorated from heat.     Outpatient Medications Prior to Visit  Medication Sig Dispense Refill  . clopidogrel (PLAVIX) 75 MG tablet TAKE 1 TABLET BY MOUTH DAILY 90 tablet 0  . furosemide (LASIX) 40 MG tablet TAKE ONE TABLET BY MOUTH EVERY DAY 90 tablet 0  . isosorbide mononitrate (IMDUR) 60 MG 24 hr tablet TAKE ONE TABLET BY MOUTH 2 TIMES A DAY 180 tablet 1  . meclizine (ANTIVERT) 25 MG tablet Take 1 tablet (25 mg total) by mouth 3 (three) times daily as needed for dizziness. 90 tablet 6  . metoprolol tartrate (LOPRESSOR) 25 MG tablet TAKE ONE TABLET BY MOUTH 2 TIMES A DAY 180 tablet 0  . nitroGLYCERIN (NITROSTAT) 0.4 MG SL tablet Place 1 tablet (0.4 mg total) under the tongue every 5 (five) minutes as needed for  chest pain. 30 tablet 6  . pantoprazole (PROTONIX) 40 MG tablet TAKE ONE TABLET BY MOUTH EVERY DAY 90 tablet 0  . potassium chloride SA (KLOR-CON) 20 MEQ tablet TAKE ONE TABLET BY MOUTH EVERY DAY 90 tablet 0  . simvastatin (ZOCOR) 20 MG tablet TAKE ONE TABLET BY MOUTH EVERY DAY 90 tablet 0  . triamcinolone ointment (KENALOG) 0.5 % APPLY 1 APPLICATION 2 TIMES A DAY 80 g 0  . methylPREDNISolone (MEDROL DOSEPAK) 4 MG TBPK tablet Take as directed on package. 21 tablet 0   Facility-Administered Medications Prior to Visit  Medication Dose Route Frequency Provider Last Rate Last Dose  . EPINEPHrine (EPI-PEN) injection 0.3 mg  0.3 mg Intramuscular Once PRN Hugelmeyer, Alexis, DO        Allergies  Allergen Reactions  . Aspirin Anaphylaxis  . Celebrex [Celecoxib] Anaphylaxis and Hives  . Fentanyl Anaphylaxis  . Latex Hives  . Lisinopril Shortness Of Breath  . Percocet [Oxycodone-Acetaminophen] Shortness Of Breath  . Simvastatin-High Dose Hives and Shortness Of Breath    Can tolerate 20 mg dose.   Jonah Blue [Metaxalone] Anaphylaxis  . Morphine Swelling  . Vicodin [Hydrocodone-Acetaminophen] Hives and Other (See Comments)    Feels like her tongue swells  . Albuterol Palpitations    ROS Review of Systems  Constitutional: Negative.   HENT: Negative.   Eyes: Negative.   Respiratory: Negative.   Cardiovascular: Positive for leg swelling.  Gastrointestinal: Negative.   Endocrine: Negative.   Genitourinary: Negative.   Musculoskeletal: Negative.   Skin: Positive for rash (all over her body. ).  Allergic/Immunologic: Negative.   Neurological: Negative.   Hematological: Negative.   Psychiatric/Behavioral: Negative.       Objective:    Physical Exam  Constitutional: She is oriented to person, place, and time. She appears well-developed and well-nourished. No distress.  HENT:  Head: Normocephalic and atraumatic.  Eyes: Pupils are equal, round, and reactive to light. Conjunctivae and  EOM are normal.  Neck: Normal range of motion.  Cardiovascular: Normal rate, regular rhythm and normal heart sounds.  Pulmonary/Chest: Effort normal and breath sounds normal. No respiratory distress.  Musculoskeletal: Normal range of motion.  Neurological: She is alert and oriented to person, place, and time.  Skin: Skin is warm and dry. Rash noted. Rash is maculopapular (diffuse rash on trunk, back, upper and lower extremities and buttocks. Various levels of excorriation. ).  Psychiatric: She has  a normal mood and affect. Her behavior is normal. Judgment and thought content normal.  Nursing note and vitals reviewed.   BP (!) 209/113   Pulse 82   Temp 97.6 F (36.4 C)   Ht 5\' 7"  (1.702 m)   Wt (!) 302 lb (137 kg)   SpO2 98%   BMI 47.30 kg/m  Wt Readings from Last 3 Encounters:  02/13/19 (!) 302 lb (137 kg)  01/30/19 (!) 301 lb 1.6 oz (136.6 kg)  10/15/18 (!) 303 lb 12.8 oz (137.8 kg)     Health Maintenance Due  Topic Date Due  . PNEUMOCOCCAL POLYSACCHARIDE VACCINE AGE 46-64 HIGH RISK  05/31/1968  . FOOT EXAM  05/31/1976  . OPHTHALMOLOGY EXAM  05/31/1976  . HIV Screening  05/31/1981  . TETANUS/TDAP  05/31/1985  . PAP SMEAR-Modifier  06/01/1987  . MAMMOGRAM  05/31/2016  . COLONOSCOPY  05/31/2016    There are no preventive care reminders to display for this patient.  Lab Results  Component Value Date   TSH 1.770 10/04/2017   Lab Results  Component Value Date   WBC 6.2 09/04/2018   HGB 13.8 09/04/2018   HCT 42.1 09/04/2018   MCV 86 09/04/2018   PLT 253 09/04/2018   Lab Results  Component Value Date   NA 140 01/22/2019   K 4.4 01/22/2019   CO2 23 01/22/2019   GLUCOSE 113 (H) 01/22/2019   BUN 8 01/22/2019   CREATININE 0.82 01/22/2019   BILITOT 0.3 01/22/2019   ALKPHOS 95 01/22/2019   AST 21 01/22/2019   ALT 24 01/22/2019   PROT 6.8 01/22/2019   ALBUMIN 3.4 (L) 01/22/2019   CALCIUM 9.2 01/22/2019   ANIONGAP 8 04/09/2016   Lab Results  Component Value  Date   CHOL 149 01/22/2019   Lab Results  Component Value Date   HDL 27 (L) 01/22/2019   Lab Results  Component Value Date   LDLCALC 93 01/22/2019   Lab Results  Component Value Date   TRIG 167 (H) 01/22/2019   Lab Results  Component Value Date   CHOLHDL 5.5 (H) 01/22/2019   Lab Results  Component Value Date   HGBA1C 6.2 (H) 01/22/2019      Assessment & Plan:   Problem List Items Addressed This Visit      Cardiovascular and Mediastinum   Hypertension - Primary     Other   History of MI (myocardial infarction)   Tobacco abuse    Other Visit Diagnoses    Dermatitis       Bilateral lower extremity edema          Instructed patient to take 80mg  lasix x 3 days and then continue with regular 40 mg dose.   Follow-up: Return in about 1 week (around 02/20/2019) for lower edema.    Lanae Boast, FNP

## 2019-02-13 NOTE — Patient Instructions (Signed)
Health Maintenance, Female Adopting a healthy lifestyle and getting preventive care are important in promoting health and wellness. Ask your health care provider about:  The right schedule for you to have regular tests and exams.  Things you can do on your own to prevent diseases and keep yourself healthy. What should I know about diet, weight, and exercise? Eat a healthy diet   Eat a diet that includes plenty of vegetables, fruits, low-fat dairy products, and lean protein.  Do not eat a lot of foods that are high in solid fats, added sugars, or sodium. Maintain a healthy weight Body mass index (BMI) is used to identify weight problems. It estimates body fat based on height and weight. Your health care provider can help determine your BMI and help you achieve or maintain a healthy weight. Get regular exercise Get regular exercise. This is one of the most important things you can do for your health. Most adults should:  Exercise for at least 150 minutes each week. The exercise should increase your heart rate and make you sweat (moderate-intensity exercise).  Do strengthening exercises at least twice a week. This is in addition to the moderate-intensity exercise.  Spend less time sitting. Even light physical activity can be beneficial. Watch cholesterol and blood lipids Have your blood tested for lipids and cholesterol at 52 years of age, then have this test every 5 years. Have your cholesterol levels checked more often if:  Your lipid or cholesterol levels are high.  You are older than 52 years of age.  You are at high risk for heart disease. What should I know about cancer screening? Depending on your health history and family history, you may need to have cancer screening at various ages. This may include screening for:  Breast cancer.  Cervical cancer.  Colorectal cancer.  Skin cancer.  Lung cancer. What should I know about heart disease, diabetes, and high blood  pressure? Blood pressure and heart disease  High blood pressure causes heart disease and increases the risk of stroke. This is more likely to develop in people who have high blood pressure readings, are of African descent, or are overweight.  Have your blood pressure checked: ? Every 3-5 years if you are 18-39 years of age. ? Every year if you are 40 years old or older. Diabetes Have regular diabetes screenings. This checks your fasting blood sugar level. Have the screening done:  Once every three years after age 40 if you are at a normal weight and have a low risk for diabetes.  More often and at a younger age if you are overweight or have a high risk for diabetes. What should I know about preventing infection? Hepatitis B If you have a higher risk for hepatitis B, you should be screened for this virus. Talk with your health care provider to find out if you are at risk for hepatitis B infection. Hepatitis C Testing is recommended for:  Everyone born from 1945 through 1965.  Anyone with known risk factors for hepatitis C. Sexually transmitted infections (STIs)  Get screened for STIs, including gonorrhea and chlamydia, if: ? You are sexually active and are younger than 52 years of age. ? You are older than 52 years of age and your health care provider tells you that you are at risk for this type of infection. ? Your sexual activity has changed since you were last screened, and you are at increased risk for chlamydia or gonorrhea. Ask your health care provider if   you are at risk.  Ask your health care provider about whether you are at high risk for HIV. Your health care provider may recommend a prescription medicine to help prevent HIV infection. If you choose to take medicine to prevent HIV, you should first get tested for HIV. You should then be tested every 3 months for as long as you are taking the medicine. Pregnancy  If you are about to stop having your period (premenopausal) and  you may become pregnant, seek counseling before you get pregnant.  Take 400 to 800 micrograms (mcg) of folic acid every day if you become pregnant.  Ask for birth control (contraception) if you want to prevent pregnancy. Osteoporosis and menopause Osteoporosis is a disease in which the bones lose minerals and strength with aging. This can result in bone fractures. If you are 65 years old or older, or if you are at risk for osteoporosis and fractures, ask your health care provider if you should:  Be screened for bone loss.  Take a calcium or vitamin D supplement to lower your risk of fractures.  Be given hormone replacement therapy (HRT) to treat symptoms of menopause. Follow these instructions at home: Lifestyle  Do not use any products that contain nicotine or tobacco, such as cigarettes, e-cigarettes, and chewing tobacco. If you need help quitting, ask your health care provider.  Do not use street drugs.  Do not share needles.  Ask your health care provider for help if you need support or information about quitting drugs. Alcohol use  Do not drink alcohol if: ? Your health care provider tells you not to drink. ? You are pregnant, may be pregnant, or are planning to become pregnant.  If you drink alcohol: ? Limit how much you use to 0-1 drink a day. ? Limit intake if you are breastfeeding.  Be aware of how much alcohol is in your drink. In the [place] one drink equals one 12 oz bottle of beer (355 mL), one 5 oz glass of wine (148 mL), or one 1 oz glass of hard liquor (44 mL). General instructions  Schedule regular health, dental, and eye exams.  Stay current with your vaccines.  Tell your health care provider if: ? You often feel depressed. ? You have ever been abused or do not feel safe at home. Summary  Adopting a healthy lifestyle and getting preventive care are important in promoting health and wellness.  Follow your health care provider's instructions about healthy  diet, exercising, and getting tested or screened for diseases.  Follow your health care provider's instructions on monitoring your cholesterol and blood pressure. This information is not intended to replace advice given to you by your health care provider. Make sure you discuss any questions you have with your health care provider. Document Released: 10/10/2010 Document Revised: 03/20/2018 Document Reviewed: 03/20/2018 Elsevier Patient Education  2020 [employer]  

## 2019-02-20 ENCOUNTER — Ambulatory Visit: Payer: Self-pay

## 2019-02-24 ENCOUNTER — Other Ambulatory Visit: Payer: Self-pay | Admitting: Gerontology

## 2019-02-24 DIAGNOSIS — E1159 Type 2 diabetes mellitus with other circulatory complications: Secondary | ICD-10-CM

## 2019-04-17 ENCOUNTER — Ambulatory Visit: Payer: Self-pay

## 2019-04-21 ENCOUNTER — Other Ambulatory Visit: Payer: Self-pay | Admitting: Gerontology

## 2019-04-21 DIAGNOSIS — E1159 Type 2 diabetes mellitus with other circulatory complications: Secondary | ICD-10-CM

## 2019-04-24 ENCOUNTER — Ambulatory Visit: Payer: Self-pay

## 2019-05-12 ENCOUNTER — Other Ambulatory Visit: Payer: Self-pay | Admitting: Gerontology

## 2019-05-20 ENCOUNTER — Other Ambulatory Visit: Payer: Self-pay | Admitting: Gerontology

## 2019-05-20 ENCOUNTER — Other Ambulatory Visit: Payer: Self-pay | Admitting: Adult Health Nurse Practitioner

## 2019-05-20 DIAGNOSIS — R601 Generalized edema: Secondary | ICD-10-CM

## 2019-05-20 DIAGNOSIS — I10 Essential (primary) hypertension: Secondary | ICD-10-CM

## 2019-05-20 DIAGNOSIS — Z0000 Encounter for general adult medical examination without abnormal findings: Secondary | ICD-10-CM

## 2019-06-04 ENCOUNTER — Other Ambulatory Visit: Payer: Self-pay

## 2019-06-04 DIAGNOSIS — Z0000 Encounter for general adult medical examination without abnormal findings: Secondary | ICD-10-CM

## 2019-06-05 LAB — BASIC METABOLIC PANEL
BUN/Creatinine Ratio: 15 (ref 9–23)
BUN: 9 mg/dL (ref 6–24)
CO2: 22 mmol/L (ref 20–29)
Calcium: 9.3 mg/dL (ref 8.7–10.2)
Chloride: 104 mmol/L (ref 96–106)
Creatinine, Ser: 0.61 mg/dL (ref 0.57–1.00)
GFR calc Af Amer: 120 mL/min/{1.73_m2} (ref >59)
GFR calc non Af Amer: 104 mL/min/{1.73_m2} (ref >59)
Glucose: 126 mg/dL — ABNORMAL HIGH (ref 65–99)
Potassium: 4.7 mmol/L (ref 3.5–5.2)
Sodium: 143 mmol/L (ref 134–144)

## 2019-06-12 ENCOUNTER — Encounter: Payer: Self-pay | Admitting: Gerontology

## 2019-06-12 ENCOUNTER — Ambulatory Visit: Payer: Self-pay | Admitting: Gerontology

## 2019-06-12 ENCOUNTER — Other Ambulatory Visit: Payer: Self-pay

## 2019-06-12 VITALS — BP 138/94 | HR 82 | Ht 67.0 in | Wt 301.0 lb

## 2019-06-12 DIAGNOSIS — Z0000 Encounter for general adult medical examination without abnormal findings: Secondary | ICD-10-CM

## 2019-06-12 DIAGNOSIS — Z8679 Personal history of other diseases of the circulatory system: Secondary | ICD-10-CM | POA: Insufficient documentation

## 2019-06-12 DIAGNOSIS — I10 Essential (primary) hypertension: Secondary | ICD-10-CM

## 2019-06-12 MED ORDER — CLOPIDOGREL BISULFATE 75 MG PO TABS: 75.0000 mg | ORAL_TABLET | Freq: Every day | ORAL | 0 refills | Status: DC

## 2019-06-12 NOTE — Progress Notes (Signed)
Established Patient Office Visit  Subjective:  Patient ID: Kathy [name], female    DOB: 1966/02/17  Age: 54 y.o. MRN: [id number]  CC:  Chief Complaint  Patient presents with  . Hypertension    HPI [name] presents for follow up of hypertension and medication refill. She states that she's compliant with her medications, continues to make healthy lifestyle modifications. She states that she checks her blood pressure weekly and it's usually less than 140/90. She has a history of CHF and states that she checks her weight daily, but has not been to the CHF clinic. She requests for Pneumonia vaccine, Mammogram, Pap smear and Colonoscopy referral. She denies chest pain, palpitation, shortness of breath, fever and chills. Overall, she states that she's doing well and offers no further complaint.  Past Medical History:  Diagnosis Date  . Asthma   . CHF (congestive heart failure) ([place])   . Coronary artery disease   . Heart murmur   . Hyperlipidemia   . Hypertension   . Myocardial infarction ([place])   . Obesity   . Seizures ([place])     Past Surgical History:  Procedure Laterality Date  . ANTERIOR CRUCIATE LIGAMENT REPAIR    . BACK SURGERY     L4 and L5  . CARDIAC CATHETERIZATION  04/09/2016  . TONSILLECTOMY AND ADENOIDECTOMY  2008  . TUBAL LIGATION      Family History  Problem Relation Age of Onset  . Kidney disease Sister   . Diabetes Sister   . COPD Sister   . Stroke Sister   . Diabetes Mother   . Stroke Mother   . Heart attack Mother   . Stroke Father   . Heart attack Father   . Asthma Daughter   . Asthma Son     Social History   Socioeconomic History  . Marital status: Divorced    Spouse name: Not on file  . Number of children: 2  . Years of education: pre-requisites   . Highest education level: GED or equivalent  Occupational History  . Occupation: unemployed  Tobacco Use  . Smoking status: Former Smoker    Packs/day: 0.20    Years: 20.00    Pack  years: 4.00    Types: Cigarettes    Quit date: 08/16/2017    Years since quitting: 1.8  . Smokeless tobacco: Never Used  . Tobacco comment: Decreased smoking to less than 5 cigs/day  Substance and Sexual Activity  . Alcohol use: No  . Drug use: No  . Sexual activity: Yes    Birth control/protection: Condom  Other Topics Concern  . Not on file  Social History Narrative   Currently on food stamps. They help somewhat but still difficult to buy food that she needs. Boyfriend helps pay rent with disability, they live together. Roof is leaking really badly. Lives in modular home. Social services bought tarp which helped, but it deteriorated from heat.    Social Determinants of Health   Financial Resource Strain:   . Difficulty of Paying Living Expenses: Not on file  Food Insecurity:   . Worried About Charity fundraiser in the Last Year: Not on file  . Ran Out of Food in the Last Year: Not on file  Transportation Needs:   . Lack of Transportation (Medical): Not on file  . Lack of Transportation (Non-Medical): Not on file  Physical Activity:   . Days of Exercise per Week: Not on file  . Minutes of Exercise per  Session: Not on file  Stress:   . Feeling of Stress : Not on file  Social Connections:   . Frequency of Communication with Friends and Family: Not on file  . Frequency of Social Gatherings with Friends and Family: Not on file  . Attends Religious Services: Not on file  . Active Member of Clubs or Organizations: Not on file  . Attends Archivist Meetings: Not on file  . Marital Status: Not on file  Intimate Partner Violence:   . Fear of Current or Ex-Partner: Not on file  . Emotionally Abused: Not on file  . Physically Abused: Not on file  . Sexually Abused: Not on file    Outpatient Medications Prior to Visit  Medication Sig Dispense Refill  . isosorbide mononitrate (IMDUR) 60 MG 24 hr tablet TAKE ONE TABLET BY MOUTH 2 TIMES A DAY 120 tablet 0  . meclizine  (ANTIVERT) 25 MG tablet Take 1 tablet (25 mg total) by mouth 3 (three) times daily as needed for dizziness. 90 tablet 6  . metoprolol tartrate (LOPRESSOR) 25 MG tablet TAKE ONE TABLET BY MOUTH 2 TIMES A DAY 180 tablet 0  . nitroGLYCERIN (NITROSTAT) 0.4 MG SL tablet 1 TABLET UNDER TONGUE AS NEEDED FOR CHEST PAIN EVERY 5 MINUTES FOR MAX OF 3 DOSES IN 15 MINUTES; IF NO RELIEF AFTER 1ST DOSE CALL 911 25 tablet 0  . pantoprazole (PROTONIX) 40 MG tablet TAKE ONE TABLET BY MOUTH EVERY DAY 90 tablet 0  . potassium chloride SA (KLOR-CON) 20 MEQ tablet TAKE ONE TABLET BY MOUTH EVERY DAY 90 tablet 0  . simvastatin (ZOCOR) 20 MG tablet TAKE ONE TABLET BY MOUTH EVERY DAY 90 tablet 0  . clopidogrel (PLAVIX) 75 MG tablet TAKE 1 TABLET BY MOUTH DAILY 90 tablet 0  . furosemide (LASIX) 40 MG tablet TAKE ONE TABLET BY MOUTH EVERY DAY 90 tablet 0  . triamcinolone ointment (KENALOG) 0.5 % APPLY 1 APPLICATION 2 TIMES A DAY (Patient not taking: Reported on 06/12/2019) 80 g 0  . methylPREDNISolone (MEDROL DOSEPAK) 4 MG TBPK tablet Take as directed on package. 21 tablet 0   Facility-Administered Medications Prior to Visit  Medication Dose Route Frequency Provider Last Rate Last Admin  . EPINEPHrine (EPI-PEN) injection 0.3 mg  0.3 mg Intramuscular Once PRN Hugelmeyer, Alexis, DO        Allergies  Allergen Reactions  . Aspirin Anaphylaxis  . Celebrex [Celecoxib] Anaphylaxis and Hives  . Fentanyl Anaphylaxis  . Latex Hives  . Lisinopril Shortness Of Breath  . Percocet [Oxycodone-Acetaminophen] Shortness Of Breath  . Simvastatin-High Dose Hives and Shortness Of Breath    Can tolerate 20 mg dose.   Jonah Blue [Metaxalone] Anaphylaxis  . Morphine Swelling  . Vicodin [Hydrocodone-Acetaminophen] Hives and Other (See Comments)    Feels like her tongue swells  . Albuterol Palpitations    ROS Review of Systems  Constitutional: Negative.   Eyes: Negative.   Respiratory: Negative.   Cardiovascular: Negative.    Neurological: Positive for dizziness (history of vertigo and takes Meclizine).  Hematological: Negative.   Psychiatric/Behavioral: Negative.       Objective:    Physical Exam  Constitutional: She is oriented to person, place, and time. She appears well-developed.  HENT:  Head: Normocephalic and atraumatic.  Eyes: Pupils are equal, round, and reactive to light. EOM are normal.  Cardiovascular: Normal rate and regular rhythm.  Pulmonary/Chest: Effort normal and breath sounds normal.  Musculoskeletal:        General:  No edema. Normal range of motion.  Neurological: She is alert and oriented to person, place, and time.  Psychiatric: She has a normal mood and affect. Her behavior is normal. Judgment and thought content normal.    BP (!) 138/94 (BP Location: Right Arm, Patient Position: Sitting)   Pulse 82   Ht 5\' 7"  (1.702 m)   Wt (!) 301 lb (136.5 kg)   SpO2 95%   BMI 47.14 kg/m  Wt Readings from Last 3 Encounters:  06/12/19 (!) 301 lb (136.5 kg)  02/13/19 (!) 302 lb (137 kg)  01/30/19 (!) 301 lb 1.6 oz (136.6 kg)   She was encouraged to lose weight.  Health Maintenance Due  Topic Date Due  . PNEUMOCOCCAL POLYSACCHARIDE VACCINE AGE 53-64 HIGH RISK  05/31/1968  . OPHTHALMOLOGY EXAM  05/31/1976  . HIV Screening  05/31/1981  . TETANUS/TDAP  05/31/1985  . PAP SMEAR-Modifier  06/01/1987  . MAMMOGRAM  05/31/2016  . COLONOSCOPY  05/31/2016    There are no preventive care reminders to display for this patient.  Lab Results  Component Value Date   TSH 1.770 10/04/2017   Lab Results  Component Value Date   WBC 6.2 09/04/2018   HGB 13.8 09/04/2018   HCT 42.1 09/04/2018   MCV 86 09/04/2018   PLT 253 09/04/2018   Lab Results  Component Value Date   NA 143 06/04/2019   K 4.7 06/04/2019   CO2 22 06/04/2019   GLUCOSE 126 (H) 06/04/2019   BUN 9 06/04/2019   CREATININE 0.61 06/04/2019   BILITOT 0.3 01/22/2019   ALKPHOS 95 01/22/2019   AST 21 01/22/2019   ALT 24  01/22/2019   PROT 6.8 01/22/2019   ALBUMIN 3.4 (L) 01/22/2019   CALCIUM 9.3 06/04/2019   ANIONGAP 8 04/09/2016   Lab Results  Component Value Date   CHOL 149 01/22/2019   Lab Results  Component Value Date   HDL 27 (L) 01/22/2019   Lab Results  Component Value Date   LDLCALC 93 01/22/2019   Lab Results  Component Value Date   TRIG 167 (H) 01/22/2019   Lab Results  Component Value Date   CHOLHDL 5.5 (H) 01/22/2019   Lab Results  Component Value Date   HGBA1C 6.2 (H) 01/22/2019      Assessment & Plan:   1. Essential hypertension - Her blood pressure is improving, she will continue on current treatment regimen. - She was advised to continue on Low salt DASH diet -Take medications regularly on time -Exercise regularly as tolerated -Check blood pressure at least once a week at home or a nearby pharmacy, record and bring log to follow up appointment. -Goal is less than 140/90 and normal blood pressure is less than 120/80    2. History of CHF (congestive heart failure) - She will continue on current treatment regimen. -Continue to monitor weights daily at home -Call office for signs and symptoms of acute heart failure as discussed during appointment today( weight gain, extremity swelling, chest pain, shortness of breath at rest-exertion or laying flat) -Continue current medications as directed. -Go to the Emergency room if symptoms are sudden and severe. -Limit fluid intake if swelling worsens. - clopidogrel (PLAVIX) 75 MG tablet; Take 1 tablet (75 mg total) by mouth daily.  Dispense: 90 tablet; Refill: 0 - She was encouraged to complete charity care application for AMB referral to CHF clinic  3. Health care maintenance  - Pneumococcal polysaccharide vaccine 23-valent greater than or equal to 2yo subcutaneous/IM  was administered. - Ambulatory referral to Hematology / Oncology for Mammogram and Pap smear. - Ambulatory referral to Gastroenterology for Colonoscopy  screening.     Follow-up: Return in about 13 weeks (around 09/11/2019), or if symptoms worsen or fail to improve.    Haseeb Fiallos Jerold Coombe, NP

## 2019-06-12 NOTE — Patient Instructions (Signed)
DASH Eating Plan DASH stands for "Dietary Approaches to Stop Hypertension." The DASH eating plan is a healthy eating plan that has been shown to reduce high blood pressure (hypertension). It may also reduce your risk for type 2 diabetes, heart disease, and stroke. The DASH eating plan may also help with weight loss. What are tips for following this plan?  General guidelines  Avoid eating more than 2,300 mg (milligrams) of salt (sodium) a day. If you have hypertension, you may need to reduce your sodium intake to 1,500 mg a day.  Limit alcohol intake to no more than 1 drink a day for nonpregnant women and 2 drinks a day for men. One drink equals 12 oz of beer, 5 oz of wine, or 1 oz of hard liquor.  Work with your health care provider to maintain a healthy body weight or to lose weight. Ask what an ideal weight is for you.  Get at least 30 minutes of exercise that causes your heart to beat faster (aerobic exercise) most days of the week. Activities may include walking, swimming, or biking.  Work with your health care provider or diet and nutrition specialist (dietitian) to adjust your eating plan to your individual calorie needs. Reading food labels   Check food labels for the amount of sodium per serving. Choose foods with less than 5 percent of the Daily Value of sodium. Generally, foods with less than 300 mg of sodium per serving fit into this eating plan.  To find whole grains, look for the word "whole" as the first word in the ingredient list. Shopping  Buy products labeled as "low-sodium" or "no salt added."  Buy fresh foods. Avoid canned foods and premade or frozen meals. Cooking  Avoid adding salt when cooking. Use salt-free seasonings or herbs instead of table salt or sea salt. Check with your health care provider or pharmacist before using salt substitutes.  Do not fry foods. Cook foods using healthy methods such as baking, boiling, grilling, and broiling instead.  Cook with  heart-healthy oils, such as olive, canola, soybean, or sunflower oil. Meal planning  Eat a balanced diet that includes: ? 5 or more servings of fruits and vegetables each day. At each meal, try to fill half of your plate with fruits and vegetables. ? Up to 6-8 servings of whole grains each day. ? Less than 6 oz of lean meat, poultry, or fish each day. A 3-oz serving of meat is about the same size as a deck of cards. One egg equals 1 oz. ? 2 servings of low-fat dairy each day. ? A serving of nuts, seeds, or beans 5 times each week. ? Heart-healthy fats. Healthy fats called Omega-3 fatty acids are found in foods such as flaxseeds and coldwater fish, like sardines, salmon, and mackerel.  Limit how much you eat of the following: ? Canned or prepackaged foods. ? Food that is high in trans fat, such as fried foods. ? Food that is high in saturated fat, such as fatty meat. ? Sweets, desserts, sugary drinks, and other foods with added sugar. ? Full-fat dairy products.  Do not salt foods before eating.  Try to eat at least 2 vegetarian meals each week.  Eat more home-cooked food and less restaurant, buffet, and fast food.  When eating at a restaurant, ask that your food be prepared with less salt or no salt, if possible. What foods are recommended? The items listed may not be a complete list. Talk with your dietitian about   what dietary choices are best for you. Grains Whole-grain or whole-wheat bread. Whole-grain or whole-wheat pasta. Brown rice. Oatmeal. Quinoa. Bulgur. Whole-grain and low-sodium cereals. Pita bread. Low-fat, low-sodium crackers. Whole-wheat flour tortillas. Vegetables Fresh or frozen vegetables (raw, steamed, roasted, or grilled). Low-sodium or reduced-sodium tomato and vegetable juice. Low-sodium or reduced-sodium tomato sauce and tomato paste. Low-sodium or reduced-sodium canned vegetables. Fruits All fresh, dried, or frozen fruit. Canned fruit in natural juice (without  added sugar). Meat and other protein foods Skinless chicken or turkey. Ground chicken or turkey. Pork with fat trimmed off. Fish and seafood. Egg whites. Dried beans, peas, or lentils. Unsalted nuts, nut butters, and seeds. Unsalted canned beans. Lean cuts of beef with fat trimmed off. Low-sodium, lean deli meat. Dairy Low-fat (1%) or fat-free (skim) milk. Fat-free, low-fat, or reduced-fat cheeses. Nonfat, low-sodium ricotta or cottage cheese. Low-fat or nonfat yogurt. Low-fat, low-sodium cheese. Fats and oils Soft margarine without trans fats. Vegetable oil. Low-fat, reduced-fat, or light mayonnaise and salad dressings (reduced-sodium). Canola, safflower, olive, soybean, and sunflower oils. Avocado. Seasoning and other foods Herbs. Spices. Seasoning mixes without salt. Unsalted popcorn and pretzels. Fat-free sweets. What foods are not recommended? The items listed may not be a complete list. Talk with your dietitian about what dietary choices are best for you. Grains Baked goods made with fat, such as croissants, muffins, or some breads. Dry pasta or rice meal packs. Vegetables Creamed or fried vegetables. Vegetables in a cheese sauce. Regular canned vegetables (not low-sodium or reduced-sodium). Regular canned tomato sauce and paste (not low-sodium or reduced-sodium). Regular tomato and vegetable juice (not low-sodium or reduced-sodium). Pickles. Olives. Fruits Canned fruit in a light or heavy syrup. Fried fruit. Fruit in cream or butter sauce. Meat and other protein foods Fatty cuts of meat. Ribs. Fried meat. Bacon. Sausage. Bologna and other processed lunch meats. Salami. Fatback. Hotdogs. Bratwurst. Salted nuts and seeds. Canned beans with added salt. Canned or smoked fish. Whole eggs or egg yolks. Chicken or turkey with skin. Dairy Whole or 2% milk, cream, and half-and-half. Whole or full-fat cream cheese. Whole-fat or sweetened yogurt. Full-fat cheese. Nondairy creamers. Whipped toppings.  Processed cheese and cheese spreads. Fats and oils Butter. Stick margarine. Lard. Shortening. Ghee. Bacon fat. Tropical oils, such as coconut, palm kernel, or palm oil. Seasoning and other foods Salted popcorn and pretzels. Onion salt, garlic salt, seasoned salt, table salt, and sea salt. Worcestershire sauce. Tartar sauce. Barbecue sauce. Teriyaki sauce. Soy sauce, including reduced-sodium. Steak sauce. Canned and packaged gravies. Fish sauce. Oyster sauce. Cocktail sauce. Horseradish that you find on the shelf. Ketchup. Mustard. Meat flavorings and tenderizers. Bouillon cubes. Hot sauce and Tabasco sauce. Premade or packaged marinades. Premade or packaged taco seasonings. Relishes. Regular salad dressings. Where to find more information:  National Heart, Lung, and Blood Institute: www.nhlbi.nih.gov  American Heart Association: www.heart.org Summary  The DASH eating plan is a healthy eating plan that has been shown to reduce high blood pressure (hypertension). It may also reduce your risk for type 2 diabetes, heart disease, and stroke.  With the DASH eating plan, you should limit salt (sodium) intake to 2,300 mg a day. If you have hypertension, you may need to reduce your sodium intake to 1,500 mg a day.  When on the DASH eating plan, aim to eat more fresh fruits and vegetables, whole grains, lean proteins, low-fat dairy, and heart-healthy fats.  Work with your health care provider or diet and nutrition specialist (dietitian) to adjust your eating plan to your   individual calorie needs. This information is not intended to replace advice given to you by your health care provider. Make sure you discuss any questions you have with your health care provider. Document Revised: 03/09/2017 Document Reviewed: 03/20/2016 Elsevier Patient Education  2020 Elsevier Inc.  

## 2019-06-13 ENCOUNTER — Other Ambulatory Visit: Payer: Self-pay | Admitting: Gerontology

## 2019-06-13 ENCOUNTER — Telehealth: Payer: Self-pay | Admitting: Pharmacy Technician

## 2019-06-13 DIAGNOSIS — I10 Essential (primary) hypertension: Secondary | ICD-10-CM

## 2019-06-13 DIAGNOSIS — R601 Generalized edema: Secondary | ICD-10-CM

## 2019-06-13 NOTE — Telephone Encounter (Signed)
Received updated proof of income.  Patient eligible to receive medication assistance at [facility] until time for re-certification in 9359, and as long as eligibility requirements continue to be met.  [facility]

## 2019-06-18 NOTE — Progress Notes (Deleted)
   Patient ID: Kathy [name], female    DOB: 06/01/1965, 54 y.o.   MRN: [id number]  HPI  Kathy [name] is a 54 y/o female with a history of  Echo report from 02/27/17 reviewed and showed an EF of 55-60%.  Has not been admitted or been in the ED in the last 6 months.   She presents today for her initial visit with a chief complaint of  Review of Systems    Physical Exam    Assessment & Plan:  1: Chronic heart failure with preserved ejection fraction- - NYHA class - needs updated echo; ask about [facility] Assistance  2: HTN- - BP - saw PCP @ [facility] 06/12/19 - BMP 06/04/19 reviewed and showed sodium 143, potassium 4.7, creatinine 0.61 and GFR 104

## 2019-06-19 ENCOUNTER — Ambulatory Visit: Payer: Self-pay | Admitting: Family

## 2019-07-17 ENCOUNTER — Ambulatory Visit (INDEPENDENT_AMBULATORY_CARE_PROVIDER_SITE_OTHER): Payer: Self-pay | Admitting: Gastroenterology

## 2019-07-17 DIAGNOSIS — Z1211 Encounter for screening for malignant neoplasm of colon: Secondary | ICD-10-CM

## 2019-07-17 MED ORDER — NA SULFATE-K SULFATE-MG SULF 17.5-3.13-1.6 GM/177ML PO SOLN: 1.0000 | Freq: Once | ORAL | 0 refills | Status: AC

## 2019-07-17 NOTE — Progress Notes (Signed)
Gastroenterology Pre-Procedure Review  Request Date: 08/04/19 Requesting Physician: Dr. Marius Ditch  PATIENT REVIEW QUESTIONS: The patient responded to the following health history questions as indicated:    1. Are you having any GI issues? no 2. Do you have a personal history of Polyps? no 3. Do you have a family history of Colon Cancer or Polyps? yes (grandfather colon cancer, mother colon polyps) 4. Diabetes Mellitus? no 5. Joint replacements in the past 12 months?no 6. Major health problems in the past 3 months?no 7. Any artificial heart valves, MVP, or defibrillator?no    MEDICATIONS & ALLERGIES:    Patient reports the following regarding taking any anticoagulation/antiplatelet therapy:   Plavix, Coumadin, Eliquis, Xarelto, Lovenox, Pradaxa, Brilinta, or Effient? yes (Plavix blood thinner request sent to Dr. Hattie Perch) Aspirin? no  Patient confirms/reports the following medications:  Current Outpatient Medications  Medication Sig Dispense Refill  . clopidogrel (PLAVIX) 75 MG tablet Take 1 tablet (75 mg total) by mouth daily. 90 tablet 0  . furosemide (LASIX) 40 MG tablet TAKE ONE TABLET BY MOUTH EVERY DAY 90 tablet 0  . isosorbide mononitrate (IMDUR) 60 MG 24 hr tablet TAKE ONE TABLET BY MOUTH 2 TIMES A DAY 120 tablet 0  . meclizine (ANTIVERT) 25 MG tablet Take 1 tablet (25 mg total) by mouth 3 (three) times daily as needed for dizziness. 90 tablet 6  . metoprolol tartrate (LOPRESSOR) 25 MG tablet TAKE ONE TABLET BY MOUTH 2 TIMES A DAY 180 tablet 0  . nitroGLYCERIN (NITROSTAT) 0.4 MG SL tablet 1 TABLET UNDER TONGUE AS NEEDED FOR CHEST PAIN EVERY 5 MINUTES FOR MAX OF 3 DOSES IN 15 MINUTES; IF NO RELIEF AFTER 1ST DOSE CALL 911 25 tablet 0  . pantoprazole (PROTONIX) 40 MG tablet TAKE ONE TABLET BY MOUTH EVERY DAY 90 tablet 0  . potassium chloride SA (KLOR-CON) 20 MEQ tablet TAKE ONE TABLET BY MOUTH EVERY DAY 90 tablet 0  . simvastatin (ZOCOR) 20 MG tablet TAKE ONE TABLET BY MOUTH EVERY  DAY 90 tablet 0  . triamcinolone ointment (KENALOG) 0.5 % APPLY 1 APPLICATION 2 TIMES A DAY (Patient not taking: Reported on 06/12/2019) 80 g 0   Current Facility-Administered Medications  Medication Dose Route Frequency Provider Last Rate Last Admin  . EPINEPHrine (EPI-PEN) injection 0.3 mg  0.3 mg Intramuscular Once PRN Hugelmeyer, Alexis, DO        Patient confirms/reports the following allergies:  Allergies  Allergen Reactions  . Aspirin Anaphylaxis  . Celebrex [Celecoxib] Anaphylaxis and Hives  . Fentanyl Anaphylaxis  . Latex Hives  . Lisinopril Shortness Of Breath  . Percocet [Oxycodone-Acetaminophen] Shortness Of Breath  . Simvastatin-High Dose Hives and Shortness Of Breath    Can tolerate 20 mg dose.   Jonah Blue [Metaxalone] Anaphylaxis  . Morphine Swelling  . Vicodin [Hydrocodone-Acetaminophen] Hives and Other (See Comments)    Feels like her tongue swells  . Albuterol Palpitations    No orders of the defined types were placed in this encounter.   AUTHORIZATION INFORMATION Primary Insurance: 1D#: Group #:  Secondary Insurance: 1D#: Group #:  SCHEDULE INFORMATION: Date: Monday 08/04/19 Time: Location:[facility]

## 2019-07-31 ENCOUNTER — Other Ambulatory Visit: Payer: Self-pay

## 2019-07-31 ENCOUNTER — Other Ambulatory Visit
Admission: RE | Admit: 2019-07-31 | Discharge: 2019-07-31 | Disposition: A | Discharge status: Home / Self Care | Payer: HRSA Program | Source: Ambulatory Visit | Attending: Gastroenterology | Admitting: Gastroenterology

## 2019-07-31 DIAGNOSIS — Z01812 Encounter for preprocedural laboratory examination: Secondary | ICD-10-CM | POA: Diagnosis present

## 2019-07-31 DIAGNOSIS — Z20822 Contact with and (suspected) exposure to covid-19: Secondary | ICD-10-CM | POA: Diagnosis not present

## 2019-07-31 LAB — SARS CORONAVIRUS 2 (TAT 6-24 HRS): SARS Coronavirus 2: NEGATIVE

## 2019-08-01 ENCOUNTER — Encounter: Payer: Self-pay | Admitting: Gastroenterology

## 2019-08-04 ENCOUNTER — Encounter
Admission: RE | Disposition: A | Discharge status: Home / Self Care | Payer: Self-pay | Source: Home / Self Care | Attending: Gastroenterology

## 2019-08-04 ENCOUNTER — Ambulatory Visit
Admission: RE | Admit: 2019-08-04 | Discharge: 2019-08-04 | Disposition: A | Discharge status: Home / Self Care | Payer: Self-pay | Attending: Gastroenterology | Admitting: Gastroenterology

## 2019-08-04 ENCOUNTER — Other Ambulatory Visit: Payer: Self-pay

## 2019-08-04 ENCOUNTER — Ambulatory Visit: Payer: Self-pay | Admitting: Certified Registered Nurse Anesthetist

## 2019-08-04 ENCOUNTER — Encounter: Payer: Self-pay | Admitting: Gastroenterology

## 2019-08-04 DIAGNOSIS — I252 Old myocardial infarction: Secondary | ICD-10-CM | POA: Insufficient documentation

## 2019-08-04 DIAGNOSIS — D120 Benign neoplasm of cecum: Secondary | ICD-10-CM | POA: Insufficient documentation

## 2019-08-04 DIAGNOSIS — I509 Heart failure, unspecified: Secondary | ICD-10-CM | POA: Insufficient documentation

## 2019-08-04 DIAGNOSIS — Z1211 Encounter for screening for malignant neoplasm of colon: Secondary | ICD-10-CM | POA: Insufficient documentation

## 2019-08-04 DIAGNOSIS — D124 Benign neoplasm of descending colon: Secondary | ICD-10-CM | POA: Insufficient documentation

## 2019-08-04 DIAGNOSIS — Z8249 Family history of ischemic heart disease and other diseases of the circulatory system: Secondary | ICD-10-CM | POA: Insufficient documentation

## 2019-08-04 DIAGNOSIS — Z87891 Personal history of nicotine dependence: Secondary | ICD-10-CM | POA: Insufficient documentation

## 2019-08-04 DIAGNOSIS — Z6841 Body Mass Index (BMI) 40.0 and over, adult: Secondary | ICD-10-CM | POA: Insufficient documentation

## 2019-08-04 DIAGNOSIS — Z7902 Long term (current) use of antithrombotics/antiplatelets: Secondary | ICD-10-CM | POA: Insufficient documentation

## 2019-08-04 DIAGNOSIS — Z79899 Other long term (current) drug therapy: Secondary | ICD-10-CM | POA: Insufficient documentation

## 2019-08-04 DIAGNOSIS — K635 Polyp of colon: Secondary | ICD-10-CM

## 2019-08-04 DIAGNOSIS — I2510 Atherosclerotic heart disease of native coronary artery without angina pectoris: Secondary | ICD-10-CM | POA: Insufficient documentation

## 2019-08-04 DIAGNOSIS — J45909 Unspecified asthma, uncomplicated: Secondary | ICD-10-CM | POA: Insufficient documentation

## 2019-08-04 DIAGNOSIS — E785 Hyperlipidemia, unspecified: Secondary | ICD-10-CM | POA: Insufficient documentation

## 2019-08-04 DIAGNOSIS — I110 Hypertensive heart disease with heart failure: Secondary | ICD-10-CM | POA: Insufficient documentation

## 2019-08-04 HISTORY — PX: COLONOSCOPY WITH PROPOFOL: SHX5780

## 2019-08-04 SURGERY — COLONOSCOPY WITH PROPOFOL
Anesthesia: General

## 2019-08-04 MED ORDER — LIDOCAINE HCL (CARDIAC) PF 100 MG/5ML IV SOSY
PREFILLED_SYRINGE | INTRAVENOUS | Status: DC | PRN
  Administered 2019-08-04: 50 mg via INTRAVENOUS

## 2019-08-04 MED ORDER — PROPOFOL 10 MG/ML IV BOLUS
INTRAVENOUS | Status: DC | PRN
  Administered 2019-08-04 (×2): 40 mg via INTRAVENOUS

## 2019-08-04 MED ORDER — PROPOFOL 500 MG/50ML IV EMUL
INTRAVENOUS | Status: AC
  Filled 2019-08-04: qty 50

## 2019-08-04 MED ORDER — PROPOFOL 500 MG/50ML IV EMUL
INTRAVENOUS | Status: DC | PRN
  Administered 2019-08-04: 140 ug/kg/min via INTRAVENOUS

## 2019-08-04 MED ORDER — PROPOFOL 10 MG/ML IV BOLUS
INTRAVENOUS | Status: AC
  Filled 2019-08-04: qty 20

## 2019-08-04 MED ORDER — SODIUM CHLORIDE 0.9 % IV SOLN: INTRAVENOUS | Status: DC

## 2019-08-04 NOTE — H&P (Signed)
Cephas Darby, MD [address]  [place]  [place], [place] [address]  Main: [phone]  Fax: [phone] Pager: [phone]  Primary Care Physician:  Langston Reusing, NP Primary Gastroenterologist:  Dr. Cephas Darby  Pre-Procedure History & Physical: HPI:  Kathy [name] is a 54 y.o. female is here for an colonoscopy.   Past Medical History:  Diagnosis Date  . Asthma   . CHF (congestive heart failure) ([place])   . Coronary artery disease   . Heart murmur   . Hyperlipidemia   . Hypertension   . Myocardial infarction ([place])   . Obesity   . Seizures ([place])     Past Surgical History:  Procedure Laterality Date  . ANTERIOR CRUCIATE LIGAMENT REPAIR    . BACK SURGERY     L4 and L5  . CARDIAC CATHETERIZATION  04/09/2016  . COLONOSCOPY    . TONSILLECTOMY    . TONSILLECTOMY AND ADENOIDECTOMY  2008  . TUBAL LIGATION      Prior to Admission medications   Medication Sig Start Date End Date Taking? Authorizing Provider  clopidogrel (PLAVIX) 75 MG tablet Take 1 tablet (75 mg total) by mouth daily. 06/12/19  Yes Iloabachie, Chioma E, NP  diphenhydrAMINE (BENADRYL) 50 MG tablet Take 50 mg by mouth every 8 (eight) hours as needed for itching.   Yes [provider]  furosemide (LASIX) 40 MG tablet TAKE ONE TABLET BY MOUTH EVERY DAY 06/17/19  Yes Iloabachie, Chioma E, NP  isosorbide mononitrate (IMDUR) 60 MG 24 hr tablet TAKE ONE TABLET BY MOUTH 2 TIMES A DAY 04/22/19  Yes McGowan, Larene Beach A, PA-C  meclizine (ANTIVERT) 25 MG tablet Take 1 tablet (25 mg total) by mouth 3 (three) times daily as needed for dizziness. 12/16/15  Yes McGowan, Larene Beach A, PA-C  metoprolol tartrate (LOPRESSOR) 25 MG tablet TAKE ONE TABLET BY MOUTH 2 TIMES A DAY 02/11/19  Yes Lanae Boast, FNP  nitroGLYCERIN (NITROSTAT) 0.4 MG SL tablet 1 TABLET UNDER TONGUE AS NEEDED FOR CHEST PAIN EVERY 5 MINUTES FOR MAX OF 3 DOSES IN 15 MINUTES; IF NO RELIEF AFTER 1ST DOSE CALL 911 05/13/19  Yes McGowan, Shannon  A, PA-C  pantoprazole (PROTONIX) 40 MG tablet TAKE ONE TABLET BY MOUTH EVERY DAY 02/11/19  Yes Lanae Boast, FNP  potassium chloride SA (KLOR-CON) 20 MEQ tablet TAKE ONE TABLET BY MOUTH EVERY DAY 02/11/19  Yes Lanae Boast, FNP  simvastatin (ZOCOR) 20 MG tablet TAKE ONE TABLET BY MOUTH EVERY DAY 02/11/19  Yes Lanae Boast, FNP  triamcinolone ointment (KENALOG) 0.5 % APPLY 1 APPLICATION 2 TIMES A DAY 01/30/19  Yes Lanae Boast, FNP    Allergies as of 07/17/2019 - Review Complete 07/17/2019  Allergen Reaction Noted  . Aspirin Anaphylaxis 07/20/2015  . Celebrex [celecoxib] Anaphylaxis and Hives 07/20/2015  . Fentanyl Anaphylaxis 04/09/2016  . Latex Hives 11/06/2017  . Lisinopril Shortness Of Breath 05/15/2016  . Percocet [oxycodone-acetaminophen] Shortness Of Breath 07/20/2015  . Simvastatin-high dose Hives and Shortness Of Breath 07/20/2015  . Skelaxin [metaxalone] Anaphylaxis 07/20/2015  . Morphine Swelling 07/20/2015  . Vicodin [hydrocodone-acetaminophen] Hives and Other (See Comments) 11/05/2015  . Albuterol Palpitations 12/16/2015    Family History  Problem Relation Age of Onset  . Kidney disease Sister   . Diabetes Sister   . COPD Sister   . Stroke Sister   . Diabetes Mother   . Stroke Mother   . Heart attack Mother   . Stroke Father   . Heart attack Father   .  Asthma Daughter   . Asthma Son     Social History   Socioeconomic History  . Marital status: Divorced    Spouse name: Not on file  . Number of children: 2  . Years of education: pre-requisites   . Highest education level: GED or equivalent  Occupational History  . Occupation: unemployed  Tobacco Use  . Smoking status: Former Smoker    Packs/day: 0.20    Years: 20.00    Pack years: 4.00    Types: Cigarettes    Quit date: 08/16/2017    Years since quitting: 1.9  . Smokeless tobacco: Never Used  . Tobacco comment: Decreased smoking to less than 5 cigs/day  Substance and Sexual Activity  . Alcohol  use: No  . Drug use: No  . Sexual activity: Yes    Birth control/protection: Condom  Other Topics Concern  . Not on file  Social History Narrative   Currently on food stamps. They help somewhat but still difficult to buy food that she needs. Boyfriend helps pay rent with disability, they live together. Roof is leaking really badly. Lives in modular home. Social services bought tarp which helped, but it deteriorated from heat.    Social Determinants of Health   Financial Resource Strain:   . Difficulty of Paying Living Expenses:   Food Insecurity:   . Worried About Charity fundraiser in the Last Year:   . Arboriculturist in the Last Year:   Transportation Needs:   . Film/video editor (Medical):   Marland Kitchen Lack of Transportation (Non-Medical):   Physical Activity:   . Days of Exercise per Week:   . Minutes of Exercise per Session:   Stress:   . Feeling of Stress :   Social Connections:   . Frequency of Communication with Friends and Family:   . Frequency of Social Gatherings with Friends and Family:   . Attends Religious Services:   . Active Member of Clubs or Organizations:   . Attends Archivist Meetings:   Marland Kitchen Marital Status:   Intimate Partner Violence:   . Fear of Current or Ex-Partner:   . Emotionally Abused:   Marland Kitchen Physically Abused:   . Sexually Abused:     Review of Systems: See HPI, otherwise negative ROS  Physical Exam: BP (!) 181/91   Pulse 72   Temp (!) 96.2 F (35.7 C)   Resp 18   Ht 5\' 7"  (1.702 m)   Wt (!) 136.5 kg   SpO2 99%   BMI 47.14 kg/m  General:   Alert,  pleasant and cooperative in NAD Head:  Normocephalic and atraumatic. Neck:  Supple; no masses or thyromegaly. Lungs:  Clear throughout to auscultation.    Heart:  Regular rate and rhythm. Abdomen:  Soft, nontender and nondistended. Normal bowel sounds, without guarding, and without rebound.   Neurologic:  Alert and  oriented x4;  grossly normal  neurologically.  Impression/Plan: Kathy [name] is here for an colonoscopy to be performed for colon cancer screening  Risks, benefits, limitations, and alternatives regarding  colonoscopy have been reviewed with the patient.  Questions have been answered.  All parties agreeable.   Sherri Sear, MD  08/04/2019, 10:15 AM

## 2019-08-04 NOTE — Anesthesia Preprocedure Evaluation (Addendum)
Anesthesia Evaluation  Patient identified by MRN, date of birth, ID band Patient awake    Reviewed: Allergy & Precautions, H&P , NPO status , Patient's Chart, lab work & pertinent test results  Airway Mallampati: III  TM Distance: >3 FB Neck ROM: full    Dental  (+) Missing   Pulmonary asthma , former smoker,    breath sounds clear to auscultation       Cardiovascular hypertension, (-) angina+ CAD, + Past MI and +CHF  + dysrhythmias (LBBB)  Rhythm:regular Rate:Normal     Neuro/Psych Seizures -,  negative psych ROS   GI/Hepatic negative GI ROS, Neg liver ROS,   Endo/Other  Morbid obesity  Renal/GU negative Renal ROS  negative genitourinary   Musculoskeletal   Abdominal   Peds  Hematology negative hematology ROS (+)   Anesthesia Other Findings Past Medical History: No date: Asthma No date: CHF (congestive heart failure) ([facility]) No date: Coronary artery disease No date: Heart murmur No date: Hyperlipidemia No date: Hypertension No date: Myocardial infarction ([place]) No date: Obesity No date: Seizures ([place])  Past Surgical History: No date: ANTERIOR CRUCIATE LIGAMENT REPAIR No date: BACK SURGERY     Comment:  L4 and L5 04/09/2016: CARDIAC CATHETERIZATION 2008: TONSILLECTOMY AND ADENOIDECTOMY No date: TUBAL LIGATION     Reproductive/Obstetrics negative OB ROS                            Anesthesia Physical Anesthesia Plan  ASA: III  Anesthesia Plan: General   Post-op Pain Management:    Induction:   PONV Risk Score and Plan: Propofol infusion and TIVA  Airway Management Planned:   Additional Equipment:   Intra-op Plan:   Post-operative Plan:   Informed Consent: I have reviewed the patients History and Physical, chart, labs and discussed the procedure including the risks, benefits and alternatives for the proposed anesthesia with the patient or authorized representative who  has indicated his/her understanding and acceptance.     Dental Advisory Given  Plan Discussed with: Anesthesiologist  Anesthesia Plan Comments:         Anesthesia Quick Evaluation

## 2019-08-04 NOTE — Transfer of Care (Signed)
Immediate Anesthesia Transfer of Care Note  Patient: Kathy [name]  Procedure(s) Performed: COLONOSCOPY WITH PROPOFOL (N/A )  Patient Location: PACU  Anesthesia Type:General  Level of Consciousness: awake, alert  and oriented  Airway & Oxygen Therapy: Patient Spontanous Breathing and Patient connected to nasal cannula oxygen  Post-op Assessment: Report given to RN and Post -op Vital signs reviewed and stable  Post vital signs: Reviewed and stable  Last Vitals:  Vitals Value Taken Time  BP    Temp    Pulse    Resp    SpO2      Last Pain:  Vitals:   08/04/19 0954  PainSc: 0-No pain         Complications: No apparent anesthesia complications

## 2019-08-04 NOTE — Op Note (Signed)
[facility]enterology Patient Name: Kathy [name] Procedure Date: 08/04/2019 10:50 AM MRN: [id number] Account #: [id number] Date of Birth: Feb 03, 1966 Admit Type: Outpatient Age: 54 Room:  [facility] END[facility] ROOM 1 Gender: Female Note Status: Finalized Procedure:             Colonoscopy Indications:           Screening for colorectal malignant neoplasm, Last                         colonoscopy: April 2007 Providers:             Lin Landsman MD, MD Medicines:             Monitored Anesthesia Care Complications:         No immediate complications. Estimated blood loss: None. Procedure:             Pre-Anesthesia Assessment:                        - Prior to the procedure, a History and Physical was                         performed, and patient medications and allergies were                         reviewed. The patient is competent. The risks and                         benefits of the procedure and the sedation options and                         risks were discussed with the patient. All questions                         were answered and informed consent was obtained.                         Patient identification and proposed procedure were                         verified by the physician, the nurse, the                         anesthesiologist, the anesthetist and the technician                         in the pre-procedure area in the procedure room in the                         endoscopy suite. Mental Status Examination: alert and                         oriented. Airway Examination: normal oropharyngeal                         airway and neck mobility. Respiratory Examination:                         clear to auscultation. CV Examination: normal.  Prophylactic Antibiotics: The patient does not require                         prophylactic antibiotics. Prior Anticoagulants: The                         patient has taken no previous  anticoagulant or                         antiplatelet agents. ASA Grade Assessment: III - A                         patient with severe systemic disease. After reviewing                         the risks and benefits, the patient was deemed in                         satisfactory condition to undergo the procedure. The                         anesthesia plan was to use monitored anesthesia care                         (MAC). Immediately prior to administration of                         medications, the patient was re-assessed for adequacy                         to receive sedatives. The heart rate, respiratory                         rate, oxygen saturations, blood pressure, adequacy of                         pulmonary ventilation, and response to care were                         monitored throughout the procedure. The physical                         status of the patient was re-assessed after the                         procedure.                        After obtaining informed consent, the colonoscope was                         passed under direct vision. Throughout the procedure,                         the patient's blood pressure, pulse, and oxygen                         saturations were monitored continuously. The  Colonoscope was introduced through the anus and                         advanced to the the cecum, identified by appendiceal                         orifice and ileocecal valve. The colonoscopy was                         performed without difficulty. The patient tolerated                         the procedure well. The quality of the bowel                         preparation was evaluated using the BBPS [facility] Bowel                         Preparation Scale) with scores of: Right Colon = 3,                         Transverse Colon = 3 and Left Colon = 3 (entire mucosa                         seen well with no residual staining, small  fragments                         of stool or opaque liquid). The total BBPS score                         equals 9. Findings:      The perianal and digital rectal examinations were normal. Pertinent       negatives include normal sphincter tone and no palpable rectal lesions.      Two sessile polyps were found in the descending colon and cecum. The       polyps were 4 to 5 mm in size. These polyps were removed with a cold       snare. Resection and retrieval were complete.      The retroflexed view of the distal rectum and anal verge was normal and       showed no anal or rectal abnormalities. Impression:            - Two 4 to 5 mm polyps in the descending colon and in                         the cecum, removed with a cold snare. Resected and                         retrieved.                        - The distal rectum and anal verge are normal on                         retroflexion view. Recommendation:        - Discharge patient to home (with escort).                        -  Resume previous diet today.                        - Continue present medications.                        - Await pathology results.                        - Repeat colonoscopy in 7 years for surveillance. Procedure Code(s):     --- Professional ---                        [phone], Colonoscopy, flexible; with removal of                         tumor(s), polyp(s), or other lesion(s) by snare                         technique Diagnosis Code(s):     --- Professional ---                        Z12.11, Encounter for screening for malignant neoplasm                         of colon                        K63.5, Polyp of colon CPT copyright 2019 American Medical Association. All rights reserved. The codes documented in this report are preliminary and upon coder review may  be revised to meet current compliance requirements. Dr. Ulyess Mort Lin Landsman MD, MD 08/04/2019 11:16:15 AM This report has been signed  electronically. Number of Addenda: 0 Note Initiated On: 08/04/2019 10:50 AM Scope Withdrawal Time: 0 hours 13 minutes 37 seconds  Total Procedure Duration: 0 hours 16 minutes 8 seconds  Estimated Blood Loss:  Estimated blood loss: none.      [facility]

## 2019-08-04 NOTE — Anesthesia Postprocedure Evaluation (Signed)
Anesthesia Post Note  Patient: Kathy [name]  Procedure(s) Performed: COLONOSCOPY WITH PROPOFOL (N/A )  Patient location during evaluation: PACU Anesthesia Type: General Level of consciousness: awake and alert Pain management: pain level controlled Vital Signs Assessment: post-procedure vital signs reviewed and stable Respiratory status: spontaneous breathing, nonlabored ventilation and respiratory function stable Cardiovascular status: blood pressure returned to baseline and stable Postop Assessment: no apparent nausea or vomiting Anesthetic complications: no     Last Vitals:  Vitals:   08/04/19 1122 08/04/19 1132  BP: 119/62 (!) 139/92  Pulse: 74 68  Resp: 17 18  Temp: (!) 35.8 C   SpO2: 98% 98%    Last Pain:  Vitals:   08/04/19 1142  TempSrc:   PainSc: 0-No pain                 Brett Canales Sence[place]

## 2019-08-05 ENCOUNTER — Encounter: Payer: Self-pay | Admitting: Gastroenterology

## 2019-08-05 ENCOUNTER — Encounter: Payer: Self-pay | Admitting: *Deleted

## 2019-08-05 LAB — SURGICAL PATHOLOGY

## 2019-08-08 ENCOUNTER — Other Ambulatory Visit: Payer: Self-pay | Admitting: Gerontology

## 2019-08-08 ENCOUNTER — Other Ambulatory Visit: Payer: Self-pay | Admitting: Adult Health Nurse Practitioner

## 2019-08-08 DIAGNOSIS — I10 Essential (primary) hypertension: Secondary | ICD-10-CM

## 2019-08-18 ENCOUNTER — Other Ambulatory Visit: Payer: Self-pay | Admitting: Gerontology

## 2019-08-18 DIAGNOSIS — E1159 Type 2 diabetes mellitus with other circulatory complications: Secondary | ICD-10-CM

## 2019-08-28 ENCOUNTER — Encounter: Payer: Self-pay | Admitting: Cardiovascular Disease

## 2019-08-28 ENCOUNTER — Ambulatory Visit (INDEPENDENT_AMBULATORY_CARE_PROVIDER_SITE_OTHER): Payer: Self-pay | Admitting: Cardiovascular Disease

## 2019-08-28 ENCOUNTER — Other Ambulatory Visit: Payer: Self-pay

## 2019-08-28 VITALS — BP 130/72 | HR 78 | Ht 67.0 in | Wt 316.8 lb

## 2019-08-28 DIAGNOSIS — I25118 Atherosclerotic heart disease of native coronary artery with other forms of angina pectoris: Secondary | ICD-10-CM

## 2019-08-28 DIAGNOSIS — I10 Essential (primary) hypertension: Secondary | ICD-10-CM

## 2019-08-28 DIAGNOSIS — E782 Mixed hyperlipidemia: Secondary | ICD-10-CM

## 2019-08-28 NOTE — Progress Notes (Signed)
Cardiology Office Note   Date:  08/28/2019   ID:  Kathy [name], DOB 1966-01-17, MRN [id number]  PCP:  Langston Reusing, NP  Cardiologist:   [name], MD   Chief Complaint  Patient presents with  . OTHER    OD 6 month f/u c/o chest discomfort, sob with exertion and edema ankles. Meds reviewed verbally with pt.      History of Present Illness: Kathy [name] is a 54 y.o. female who is here today for follow-up visit regarding chronic chest pain.    The patient has known history of recurrent chest pain usually responsive to nitroglycerin which is thought to be due to coronary spasm or endothelial dysfunction.    She had cardiac catheterization done 4 times in the past in 2007, 2011, 2012 and most recently in December 2017 . All these showed mild nonobstructive coronary artery disease.  She is known to have rate dependent left bundle branch block.  She is allergic to aspirin and thus on long-term Plavix.  Other medical problems include previous tobacco use, obesity, chronic back pain and hyperlipidemia.  She has family history of coronary artery disease. We tried her on Ranexa last year with significant improvement in symptoms.  However, she could not continue the medication due to cost.   She continues to struggle with weight gain.  In addition, she continues to have exertional shortness of breath occasionally associated with chest discomfort.  This has been a chronic issue for her.  She has leg edema but usually responds to furosemide.  Past Medical History:  Diagnosis Date  . Asthma   . CHF (congestive heart failure) ([place])   . Coronary artery disease   . Heart murmur   . Hyperlipidemia   . Hypertension   . Myocardial infarction ([place])   . Obesity   . Seizures ([place])     Past Surgical History:  Procedure Laterality Date  . ANTERIOR CRUCIATE LIGAMENT REPAIR    . BACK SURGERY     L4 and L5  . CARDIAC CATHETERIZATION  04/09/2016  . COLONOSCOPY    . COLONOSCOPY WITH  PROPOFOL N/A 08/04/2019   Procedure: COLONOSCOPY WITH PROPOFOL;  Surgeon: Lin Landsman, MD;  Location: [facility] ENDOSCOPY;  Service: Gastroenterology;  Laterality: N/A;  . TONSILLECTOMY    . TONSILLECTOMY AND ADENOIDECTOMY  2008  . TUBAL LIGATION       Current Outpatient Medications  Medication Sig Dispense Refill  . clopidogrel (PLAVIX) 75 MG tablet Take 1 tablet (75 mg total) by mouth daily. 90 tablet 0  . diphenhydrAMINE (BENADRYL) 50 MG tablet Take 50 mg by mouth every 8 (eight) hours as needed for itching.    . furosemide (LASIX) 40 MG tablet TAKE ONE TABLET BY MOUTH EVERY DAY 90 tablet 0  . isosorbide mononitrate (IMDUR) 60 MG 24 hr tablet TAKE ONE TABLET BY MOUTH 2 TIMES A DAY 120 tablet 0  . meclizine (ANTIVERT) 25 MG tablet Take 1 tablet (25 mg total) by mouth 3 (three) times daily as needed for dizziness. 90 tablet 6  . metoprolol tartrate (LOPRESSOR) 25 MG tablet TAKE ONE TABLET BY MOUTH 2 TIMES A DAY 180 tablet 0  . nitroGLYCERIN (NITROSTAT) 0.4 MG SL tablet 1 TABLET UNDER TONGUE AS NEEDED FOR CHEST PAIN EVERY 5 MINUTES FOR MAX OF 3 DOSES IN 15 MINUTES; IF NO RELIEF AFTER 1ST DOSE CALL 911 25 tablet 0  . pantoprazole (PROTONIX) 40 MG tablet TAKE ONE TABLET BY MOUTH EVERY DAY  90 tablet 0  . potassium chloride SA (KLOR-CON) 20 MEQ tablet TAKE ONE TABLET BY MOUTH EVERY DAY 90 tablet 0  . simvastatin (ZOCOR) 20 MG tablet TAKE ONE TABLET BY MOUTH EVERY DAY 90 tablet 0  . triamcinolone ointment (KENALOG) 0.5 % APPLY 1 APPLICATION 2 TIMES A DAY 80 g 0   Current Facility-Administered Medications  Medication Dose Route Frequency Provider Last Rate Last Admin  . EPINEPHrine (EPI-PEN) injection 0.3 mg  0.3 mg Intramuscular Once PRN Hugelmeyer, Alexis, DO        Allergies:   Aspirin, Celebrex [celecoxib], Fentanyl, Latex, Lisinopril, Percocet [oxycodone-acetaminophen], Simvastatin-high dose, Skelaxin [metaxalone], Morphine, Vicodin [hydrocodone-acetaminophen], and Albuterol    Social  History:  The patient  reports that she quit smoking about 2 years ago. Her smoking use included cigarettes. She has a 4.00 pack-year smoking history. She has never used smokeless tobacco. She reports that she does not drink alcohol or use drugs.   Family History:  The patient's family history includes Asthma in her daughter and son; COPD in her sister; Diabetes in her mother and sister; Heart attack in her father and mother; Kidney disease in her sister; Stroke in her father, mother, and sister.    ROS:  Please see the history of present illness.   Otherwise, review of systems are positive for none.   All other systems are reviewed and negative.    PHYSICAL EXAM: VS:  BP 130/72 (BP Location: Left Arm, Patient Position: Sitting, Cuff Size: Large)   Pulse 78   Ht 5\' 7"  (1.702 m)   Wt (!) 316 lb 12 oz (143.7 kg)   SpO2 98%   BMI 49.61 kg/m  , BMI Body mass index is 49.61 kg/m. GEN: Well nourished, well developed, in no acute distress  HEENT: normal  Neck: no JVD, carotid bruits, or masses Cardiac: RRR; no  rubs, or gallops .  1/ 6 systolic murmur in the aortic area.  Mild bilateral leg edema Respiratory:  clear to auscultation bilaterally, normal work of breathing GI: soft, nontender, nondistended, + BS MS: no deformity or atrophy  Skin: warm and dry, no rash Neuro:  Strength and sensation are intact Psych: euthymic mood, full affect   EKG:  EKG is ordered today. The ekg ordered today demonstrates normal sinus rhythm with left bundle branch block.   Recent Labs: 09/04/2018: Hemoglobin 13.8; Platelets 253 01/22/2019: ALT 24 06/04/2019: BUN 9; Creatinine, Ser 0.61; Potassium 4.7; Sodium 143    Lipid Panel    Component Value Date/Time   CHOL 149 01/22/2019 1216   TRIG 167 (H) 01/22/2019 1216   HDL 27 (L) 01/22/2019 1216   CHOLHDL 5.5 (H) 01/22/2019 1216   CHOLHDL 5.7 02/24/2016 0618   VLDL 30 02/24/2016 0618   LDLCALC 93 01/22/2019 1216      Wt Readings from Last 3  Encounters:  08/28/19 (!) 316 lb 12 oz (143.7 kg)  08/04/19 (!) 301 lb (136.5 kg)  06/12/19 (!) 301 lb (136.5 kg)       PAD Screen 02/23/2017  Previous PAD dx? No  Previous surgical procedure? No  Pain with walking? Yes  Subsides with rest? No  Feet/toe relief with dangling? No  Painful, non-healing ulcers? No  Extremities discolored? No      ASSESSMENT AND PLAN:  1.  Mild coronary artery disease with other forms of angina:  She has chronic angina thought to be due to coronary spasm or endothelial dysfunction.  Significant improvement in symptoms with Ranexa but she  could not continue the medication due to cost.  Continue antianginal therapy with Imdur and metoprolol.  The plan is to put her back on Ranexa once the medication is more affordable.  2.  Cardiac murmur: Seems to be physiologic with no significant change since last visit.  Echocardiogram showed no significant valvular abnormalities.  3.  Hyperlipidemia: Continue treatment with simvastatin.  Most recent LDL was 93.  4.  Previous tobacco use: No relapse.  5.  Obesity: I discussed with her again the importance of starting some form of an exercise program and try to lose weight.  The patient is interested in surgical weight loss but unfortunately she does not have health insurance.   Disposition:   FU with me in 6 months  Signed,  [name], MD  08/28/2019 2:05 PM    [place]

## 2019-08-28 NOTE — Patient Instructions (Signed)
Medication Instructions:  Your physician recommends that you continue on your current medications as directed. Please refer to the Current Medication list given to you today.  *If you need a refill on your cardiac medications before your next appointment, please call your pharmacy*   Lab Work: None ordered If you have labs (blood work) drawn today and your tests are completely normal, you will receive your results only by: . MyChart Message (if you have MyChart) OR . A paper copy in the mail If you have any lab test that is abnormal or we need to change your treatment, we will call you to review the results.   Testing/Procedures: None ordered   Follow-Up: At [facility], you and your health needs are our priority.  As part of our continuing mission to provide you with exceptional heart care, we have created designated Provider Care Teams.  These Care Teams include your primary Cardiologist (physician) and Advanced Practice Providers (APPs -  Physician Assistants and Nurse Practitioners) who all work together to provide you with the care you need, when you need it.  We recommend signing up for the patient portal called "MyChart".  Sign up information is provided on this After Visit Summary.  MyChart is used to connect with patients for Virtual Visits (Telemedicine).  Patients are able to view lab/test results, encounter notes, upcoming appointments, etc.  Non-urgent messages can be sent to your provider as well.   To learn more about what you can do with MyChart, go to [email or web address].    Your next appointment:   6 month(s)  The format for your next appointment:   In Person  Provider:    You may see Dr. Arida or one of the following Advanced Practice Providers on your designated Care Team:    Christopher Berge, NP  Ryan Dunn, PA-C  Jacquelyn Visser, PA-C    Other Instructions N/A  

## 2019-09-24 ENCOUNTER — Ambulatory Visit: Payer: Self-pay | Admitting: Gerontology

## 2019-10-30 ENCOUNTER — Encounter: Payer: Self-pay | Admitting: Gerontology

## 2019-10-30 ENCOUNTER — Ambulatory Visit: Payer: Self-pay | Admitting: Gerontology

## 2019-10-30 ENCOUNTER — Other Ambulatory Visit: Payer: Self-pay

## 2019-10-30 VITALS — BP 124/79 | HR 76 | Wt 311.1 lb

## 2019-10-30 DIAGNOSIS — Z8679 Personal history of other diseases of the circulatory system: Secondary | ICD-10-CM

## 2019-10-30 DIAGNOSIS — Z0000 Encounter for general adult medical examination without abnormal findings: Secondary | ICD-10-CM

## 2019-10-30 DIAGNOSIS — R601 Generalized edema: Secondary | ICD-10-CM

## 2019-10-30 DIAGNOSIS — E1159 Type 2 diabetes mellitus with other circulatory complications: Secondary | ICD-10-CM

## 2019-10-30 DIAGNOSIS — E782 Mixed hyperlipidemia: Secondary | ICD-10-CM

## 2019-10-30 DIAGNOSIS — I10 Essential (primary) hypertension: Secondary | ICD-10-CM

## 2019-10-30 MED ORDER — SIMVASTATIN 20 MG PO TABS: 20.0000 mg | ORAL_TABLET | Freq: Every day | ORAL | 0 refills | Status: DC

## 2019-10-30 MED ORDER — CLOPIDOGREL BISULFATE 75 MG PO TABS: 75.0000 mg | ORAL_TABLET | Freq: Every day | ORAL | 0 refills | Status: DC

## 2019-10-30 MED ORDER — ISOSORBIDE MONONITRATE ER 60 MG PO TB24: ORAL_TABLET | ORAL | 0 refills | Status: DC

## 2019-10-30 MED ORDER — FUROSEMIDE 40 MG PO TABS: 40.0000 mg | ORAL_TABLET | Freq: Every day | ORAL | 0 refills | Status: DC

## 2019-10-30 MED ORDER — PANTOPRAZOLE SODIUM 40 MG PO TBEC: 40.0000 mg | DELAYED_RELEASE_TABLET | Freq: Every day | ORAL | 0 refills | Status: DC

## 2019-10-30 MED ORDER — POTASSIUM CHLORIDE CRYS ER 20 MEQ PO TBCR: 20.0000 meq | EXTENDED_RELEASE_TABLET | Freq: Every day | ORAL | 0 refills | Status: DC

## 2019-10-30 MED ORDER — METOPROLOL TARTRATE 25 MG PO TABS: ORAL_TABLET | ORAL | 0 refills | Status: DC

## 2019-10-30 NOTE — Patient Instructions (Signed)
DASH Eating Plan DASH stands for "Dietary Approaches to Stop Hypertension." The DASH eating plan is a healthy eating plan that has been shown to reduce high blood pressure (hypertension). It may also reduce your risk for type 2 diabetes, heart disease, and stroke. The DASH eating plan may also help with weight loss. What are tips for following this plan?  General guidelines  Avoid eating more than 2,300 mg (milligrams) of salt (sodium) a day. If you have hypertension, you may need to reduce your sodium intake to 1,500 mg a day.  Limit alcohol intake to no more than 1 drink a day for nonpregnant women and 2 drinks a day for men. One drink equals 12 oz of beer, 5 oz of wine, or 1 oz of hard liquor.  Work with your health care provider to maintain a healthy body weight or to lose weight. Ask what an ideal weight is for you.  Get at least 30 minutes of exercise that causes your heart to beat faster (aerobic exercise) most days of the week. Activities may include walking, swimming, or biking.  Work with your health care provider or diet and nutrition specialist (dietitian) to adjust your eating plan to your individual calorie needs. Reading food labels   Check food labels for the amount of sodium per serving. Choose foods with less than 5 percent of the Daily Value of sodium. Generally, foods with less than 300 mg of sodium per serving fit into this eating plan.  To find whole grains, look for the word "whole" as the first word in the ingredient list. Shopping  Buy products labeled as "low-sodium" or "no salt added."  Buy fresh foods. Avoid canned foods and premade or frozen meals. Cooking  Avoid adding salt when cooking. Use salt-free seasonings or herbs instead of table salt or sea salt. Check with your health care provider or pharmacist before using salt substitutes.  Do not fry foods. Cook foods using healthy methods such as baking, boiling, grilling, and broiling instead.  Cook with  heart-healthy oils, such as olive, canola, soybean, or sunflower oil. Meal planning  Eat a balanced diet that includes: ? 5 or more servings of fruits and vegetables each day. At each meal, try to fill half of your plate with fruits and vegetables. ? Up to 6-8 servings of whole grains each day. ? Less than 6 oz of lean meat, poultry, or fish each day. A 3-oz serving of meat is about the same size as a deck of cards. One egg equals 1 oz. ? 2 servings of low-fat dairy each day. ? A serving of nuts, seeds, or beans 5 times each week. ? Heart-healthy fats. Healthy fats called Omega-3 fatty acids are found in foods such as flaxseeds and coldwater fish, like sardines, salmon, and mackerel.  Limit how much you eat of the following: ? Canned or prepackaged foods. ? Food that is high in trans fat, such as fried foods. ? Food that is high in saturated fat, such as fatty meat. ? Sweets, desserts, sugary drinks, and other foods with added sugar. ? Full-fat dairy products.  Do not salt foods before eating.  Try to eat at least 2 vegetarian meals each week.  Eat more home-cooked food and less restaurant, buffet, and fast food.  When eating at a restaurant, ask that your food be prepared with less salt or no salt, if possible. What foods are recommended? The items listed may not be a complete list. Talk with your dietitian about   what dietary choices are best for you. Grains Whole-grain or whole-wheat bread. Whole-grain or whole-wheat pasta. Brown rice. Oatmeal. Quinoa. Bulgur. Whole-grain and low-sodium cereals. Pita bread. Low-fat, low-sodium crackers. Whole-wheat flour tortillas. Vegetables Fresh or frozen vegetables (raw, steamed, roasted, or grilled). Low-sodium or reduced-sodium tomato and vegetable juice. Low-sodium or reduced-sodium tomato sauce and tomato paste. Low-sodium or reduced-sodium canned vegetables. Fruits All fresh, dried, or frozen fruit. Canned fruit in natural juice (without  added sugar). Meat and other protein foods Skinless chicken or turkey. Ground chicken or turkey. Pork with fat trimmed off. Fish and seafood. Egg whites. Dried beans, peas, or lentils. Unsalted nuts, nut butters, and seeds. Unsalted canned beans. Lean cuts of beef with fat trimmed off. Low-sodium, lean deli meat. Dairy Low-fat (1%) or fat-free (skim) milk. Fat-free, low-fat, or reduced-fat cheeses. Nonfat, low-sodium ricotta or cottage cheese. Low-fat or nonfat yogurt. Low-fat, low-sodium cheese. Fats and oils Soft margarine without trans fats. Vegetable oil. Low-fat, reduced-fat, or light mayonnaise and salad dressings (reduced-sodium). Canola, safflower, olive, soybean, and sunflower oils. Avocado. Seasoning and other foods Herbs. Spices. Seasoning mixes without salt. Unsalted popcorn and pretzels. Fat-free sweets. What foods are not recommended? The items listed may not be a complete list. Talk with your dietitian about what dietary choices are best for you. Grains Baked goods made with fat, such as croissants, muffins, or some breads. Dry pasta or rice meal packs. Vegetables Creamed or fried vegetables. Vegetables in a cheese sauce. Regular canned vegetables (not low-sodium or reduced-sodium). Regular canned tomato sauce and paste (not low-sodium or reduced-sodium). Regular tomato and vegetable juice (not low-sodium or reduced-sodium). Pickles. Olives. Fruits Canned fruit in a light or heavy syrup. Fried fruit. Fruit in cream or butter sauce. Meat and other protein foods Fatty cuts of meat. Ribs. Fried meat. Bacon. Sausage. Bologna and other processed lunch meats. Salami. Fatback. Hotdogs. Bratwurst. Salted nuts and seeds. Canned beans with added salt. Canned or smoked fish. Whole eggs or egg yolks. Chicken or turkey with skin. Dairy Whole or 2% milk, cream, and half-and-half. Whole or full-fat cream cheese. Whole-fat or sweetened yogurt. Full-fat cheese. Nondairy creamers. Whipped toppings.  Processed cheese and cheese spreads. Fats and oils Butter. Stick margarine. Lard. Shortening. Ghee. Bacon fat. Tropical oils, such as coconut, palm kernel, or palm oil. Seasoning and other foods Salted popcorn and pretzels. Onion salt, garlic salt, seasoned salt, table salt, and sea salt. Worcestershire sauce. Tartar sauce. Barbecue sauce. Teriyaki sauce. Soy sauce, including reduced-sodium. Steak sauce. Canned and packaged gravies. Fish sauce. Oyster sauce. Cocktail sauce. Horseradish that you find on the shelf. Ketchup. Mustard. Meat flavorings and tenderizers. Bouillon cubes. Hot sauce and Tabasco sauce. Premade or packaged marinades. Premade or packaged taco seasonings. Relishes. Regular salad dressings. Where to find more information:  National Heart, Lung, and Blood Institute: www.nhlbi.nih.gov  American Heart Association: www.heart.org Summary  The DASH eating plan is a healthy eating plan that has been shown to reduce high blood pressure (hypertension). It may also reduce your risk for type 2 diabetes, heart disease, and stroke.  With the DASH eating plan, you should limit salt (sodium) intake to 2,300 mg a day. If you have hypertension, you may need to reduce your sodium intake to 1,500 mg a day.  When on the DASH eating plan, aim to eat more fresh fruits and vegetables, whole grains, lean proteins, low-fat dairy, and heart-healthy fats.  Work with your health care provider or diet and nutrition specialist (dietitian) to adjust your eating plan to your   individual calorie needs. This information is not intended to replace advice given to you by your health care provider. Make sure you discuss any questions you have with your health care provider. Document Revised: 03/09/2017 Document Reviewed: 03/20/2016 Elsevier Patient Education  2020 Elsevier Inc.  

## 2019-10-30 NOTE — Progress Notes (Signed)
Established Patient Office Visit  Subjective:  Patient ID: Kathy [name], female    DOB: February 05, 1966  Age: 54 y.o. MRN: [id number]  CC:  Chief Complaint  Patient presents with  . Hypertension  . Medication Refill  . Chest Pain    loss of breath     HPI Kathy [name] is a 54 y.o female who presents for follow up of hypertension and medication refill. She report being compliant with her treatment regimen. She was seen by her Cardiologist (Dr. [name]) on 08/28/2019 who recommends continuing with her treatment regimen with a plan of restarting her Ranexa for chronic angina once it is affordable. She had colonoscopy done 08/04/2019 which revealed two 4 to 5 mm polyps in the descending colon and in the cecum, removed with a cold snare per Dr. Marius Ditch. She denies cough, fever, and chest pain.Overall, she states that she is doing well and offers no additional complaint at this time.    Past Medical History:  Diagnosis Date  . Asthma   . CHF (congestive heart failure) ([place])   . Coronary artery disease   . Heart murmur   . Hyperlipidemia   . Hypertension   . Myocardial infarction ([place])   . Obesity   . Seizures ([place])     Past Surgical History:  Procedure Laterality Date  . ANTERIOR CRUCIATE LIGAMENT REPAIR    . BACK SURGERY     L4 and L5  . CARDIAC CATHETERIZATION  04/09/2016  . COLONOSCOPY    . COLONOSCOPY WITH PROPOFOL N/A 08/04/2019   Procedure: COLONOSCOPY WITH PROPOFOL;  Surgeon: Lin Landsman, MD;  Location: [facility] ENDOSCOPY;  Service: Gastroenterology;  Laterality: N/A;  . TONSILLECTOMY    . TONSILLECTOMY AND ADENOIDECTOMY  2008  . TUBAL LIGATION      Family History  Problem Relation Age of Onset  . Kidney disease Sister   . Diabetes Sister   . COPD Sister   . Stroke Sister   . Diabetes Mother   . Stroke Mother   . Heart attack Mother   . Stroke Father   . Heart attack Father   . Asthma Daughter   . Asthma Son     Social History   Socioeconomic  History  . Marital status: Divorced    Spouse name: Not on file  . Number of children: 2  . Years of education: pre-requisites   . Highest education level: GED or equivalent  Occupational History  . Occupation: unemployed  Tobacco Use  . Smoking status: Former Smoker    Packs/day: 0.20    Years: 20.00    Pack years: 4.00    Types: Cigarettes    Quit date: 08/16/2017    Years since quitting: 2.2  . Smokeless tobacco: Never Used  . Tobacco comment: Decreased smoking to less than 5 cigs/day  Vaping Use  . Vaping Use: Never used  Substance and Sexual Activity  . Alcohol use: No  . Drug use: No  . Sexual activity: Yes    Birth control/protection: Condom  Other Topics Concern  . Not on file  Social History Narrative   Currently on food stamps. They help somewhat but still difficult to buy food that she needs. Boyfriend helps pay rent with disability, they live together. Roof is leaking really badly. Lives in modular home. Social services bought tarp which helped, but it deteriorated from heat.    Social Determinants of Health   Financial Resource Strain:   . Difficulty of Paying  Living Expenses:   Food Insecurity:   . Worried About Charity fundraiser in the Last Year:   . Arboriculturist in the Last Year:   Transportation Needs:   . Film/video editor (Medical):   Marland Kitchen Lack of Transportation (Non-Medical):   Physical Activity:   . Days of Exercise per Week:   . Minutes of Exercise per Session:   Stress:   . Feeling of Stress :   Social Connections:   . Frequency of Communication with Friends and Family:   . Frequency of Social Gatherings with Friends and Family:   . Attends Religious Services:   . Active Member of Clubs or Organizations:   . Attends Archivist Meetings:   Marland Kitchen Marital Status:   Intimate Partner Violence:   . Fear of Current or Ex-Partner:   . Emotionally Abused:   Marland Kitchen Physically Abused:   . Sexually Abused:     Outpatient Medications Prior to  Visit  Medication Sig Dispense Refill  . diphenhydrAMINE (BENADRYL) 50 MG tablet Take 50 mg by mouth every 8 (eight) hours as needed for itching.    . meclizine (ANTIVERT) 25 MG tablet Take 1 tablet (25 mg total) by mouth 3 (three) times daily as needed for dizziness. 90 tablet 6  . nitroGLYCERIN (NITROSTAT) 0.4 MG SL tablet 1 TABLET UNDER TONGUE AS NEEDED FOR CHEST PAIN EVERY 5 MINUTES FOR MAX OF 3 DOSES IN 15 MINUTES; IF NO RELIEF AFTER 1ST DOSE CALL 911 25 tablet 0  . triamcinolone ointment (KENALOG) 0.5 % APPLY 1 APPLICATION 2 TIMES A DAY 80 g 0  . clopidogrel (PLAVIX) 75 MG tablet Take 1 tablet (75 mg total) by mouth daily. 90 tablet 0  . furosemide (LASIX) 40 MG tablet TAKE ONE TABLET BY MOUTH EVERY DAY 90 tablet 0  . isosorbide mononitrate (IMDUR) 60 MG 24 hr tablet TAKE ONE TABLET BY MOUTH 2 TIMES A DAY 120 tablet 0  . metoprolol tartrate (LOPRESSOR) 25 MG tablet TAKE ONE TABLET BY MOUTH 2 TIMES A DAY 180 tablet 0  . pantoprazole (PROTONIX) 40 MG tablet TAKE ONE TABLET BY MOUTH EVERY DAY 90 tablet 0  . potassium chloride SA (KLOR-CON) 20 MEQ tablet TAKE ONE TABLET BY MOUTH EVERY DAY 90 tablet 0  . simvastatin (ZOCOR) 20 MG tablet TAKE ONE TABLET BY MOUTH EVERY DAY 90 tablet 0   Facility-Administered Medications Prior to Visit  Medication Dose Route Frequency Provider Last Rate Last Admin  . EPINEPHrine (EPI-PEN) injection 0.3 mg  0.3 mg Intramuscular Once PRN Hugelmeyer, Alexis, DO        Allergies  Allergen Reactions  . Aspirin Anaphylaxis  . Celebrex [Celecoxib] Anaphylaxis and Hives  . Fentanyl Anaphylaxis  . Latex Hives  . Lisinopril Shortness Of Breath  . Percocet [Oxycodone-Acetaminophen] Shortness Of Breath  . Simvastatin-High Dose Hives and Shortness Of Breath    Can tolerate 20 mg dose.   Jonah Blue [Metaxalone] Anaphylaxis  . Morphine Swelling  . Vicodin [Hydrocodone-Acetaminophen] Hives and Other (See Comments)    Feels like her tongue swells  . Albuterol  Palpitations    ROS Review of Systems  Constitutional: Negative.   Cardiovascular: Negative.   Gastrointestinal: Negative.   Genitourinary: Negative.   Musculoskeletal: Negative.   Skin: Negative.   Neurological: Negative.   Hematological: Negative.   Psychiatric/Behavioral: Negative.       Objective:    Physical Exam Constitutional:      Appearance: She is well-developed. She is  obese.  HENT:     Head: Normocephalic and atraumatic.  Cardiovascular:     Rate and Rhythm: Normal rate and regular rhythm.     Heart sounds: Normal heart sounds.  Pulmonary:     Effort: Pulmonary effort is normal.     Breath sounds: Normal breath sounds.  Neurological:     General: No focal deficit present.     Mental Status: She is alert and oriented to person, place, and time.  Psychiatric:        Mood and Affect: Mood normal.        Behavior: Behavior normal.     BP 124/79 (Patient Position: Sitting, Cuff Size: Large)   Pulse 76   Wt (!) 311 lb 1.6 oz (141.1 kg)   SpO2 96%   BMI 48.73 kg/m  Wt Readings from Last 3 Encounters:  10/30/19 (!) 311 lb 1.6 oz (141.1 kg)  08/28/19 (!) 316 lb 12 oz (143.7 kg)  08/04/19 (!) 301 lb (136.5 kg)   She lost 5 lbs in 2 months and was encouraged to continue with current weight loss regimen.  Health Maintenance Due  Topic Date Due  . Hepatitis C Screening  Never done  . OPHTHALMOLOGY EXAM  Never done  . COVID-19 Vaccine (1) Never done  . HIV Screening  Never done  . TETANUS/TDAP  Never done  . PAP SMEAR-Modifier  Never done  . MAMMOGRAM  Never done  . HEMOGLOBIN A1C  07/23/2019    There are no preventive care reminders to display for this patient.  Lab Results  Component Value Date   TSH 1.770 10/04/2017   Lab Results  Component Value Date   WBC 6.2 09/04/2018   HGB 13.8 09/04/2018   HCT 42.1 09/04/2018   MCV 86 09/04/2018   PLT 253 09/04/2018   Lab Results  Component Value Date   NA 143 06/04/2019   K 4.7 06/04/2019   CO2  22 06/04/2019   GLUCOSE 126 (H) 06/04/2019   BUN 9 06/04/2019   CREATININE 0.61 06/04/2019   BILITOT 0.3 01/22/2019   ALKPHOS 95 01/22/2019   AST 21 01/22/2019   ALT 24 01/22/2019   PROT 6.8 01/22/2019   ALBUMIN 3.4 (L) 01/22/2019   CALCIUM 9.3 06/04/2019   ANIONGAP 8 04/09/2016   Lab Results  Component Value Date   CHOL 149 01/22/2019   Lab Results  Component Value Date   HDL 27 (L) 01/22/2019   Lab Results  Component Value Date   LDLCALC 93 01/22/2019   Lab Results  Component Value Date   TRIG 167 (H) 01/22/2019   Lab Results  Component Value Date   CHOLHDL 5.5 (H) 01/22/2019   Lab Results  Component Value Date   HGBA1C 6.2 (H) 01/22/2019      Assessment & Plan:   1. History of CHF (congestive heart failure) She will continue current regimen  - clopidogrel (PLAVIX) 75 MG tablet; Take 1 tablet (75 mg total) by mouth daily.  Dispense: 90 tablet; Refill: 0 - AMB referral to CHF clinic  2. Essential hypertension Her hypertension is under control. She will continue current treatment regimen  - furosemide (LASIX) 40 MG tablet; Take 1 tablet (40 mg total) by mouth daily.  Dispense: 90 tablet; Refill: 0 - metoprolol tartrate (LOPRESSOR) 25 MG tablet; Take  25 mg by mouth daily  Dispense: 180 tablet; Refill: 0 - potassium chloride SA (KLOR-CON) 20 MEQ tablet; Take 1 tablet (20 mEq total) by mouth daily.  Dispense: 90 tablet; Refill: 0  3. Generalized edema She will continue current treatment regimen, and follow up at the congestive heart failure clinic. - furosemide (LASIX) 40 MG tablet; Take 1 tablet (40 mg total) by mouth daily.  Dispense: 90 tablet; Refill: 0  4. Hypertension associated with diabetes ([place]) Her hypertension is under control. She will continue current treatment regimen  - HgB A1c - isosorbide mononitrate (IMDUR) 60 MG 24 hr tablet; Take 60 mg in 24 hour  Dispense: 120 tablet; Refill: 0 She was advised to continue DASH diet and exercise as  tolerated  5. Health care maintenance  - Ambulatory referral to Hematology / Oncology for Pap smear and mammogram   6. Mixed hyperlipidemia She will continue current treatment regimen  - simvastatin (ZOCOR) 20 MG tablet; Take 1 tablet (20 mg total) by mouth daily.  Dispense: 90 tablet; Refill: 0 She was advised to continue low fat/cholesterol diet ad exercise as tolerated.      Follow-up: Return in about 5 weeks (around 12/04/2019), or if symptoms worsen or fail to improve.    Carney Corners, RN

## 2019-10-31 ENCOUNTER — Other Ambulatory Visit: Payer: Self-pay | Admitting: *Deleted

## 2019-10-31 MED ORDER — RANOLAZINE ER 500 MG PO TB12: 500.0000 mg | ORAL_TABLET | Freq: Two times a day (BID) | ORAL | 11 refills | Status: DC

## 2019-11-04 ENCOUNTER — Telehealth: Payer: Self-pay | Admitting: Cardiovascular Disease

## 2019-11-04 NOTE — Telephone Encounter (Signed)
Update fwd to Dr. Fletcher Anon to recommend an alternative if indicated.

## 2019-11-04 NOTE — Telephone Encounter (Signed)
We prescribed Ranexa specifically because someone from medication management messaged me recently that Ranexa was available.  This seems to be confusing to me.  There is no alternative to Ranexa.

## 2019-11-04 NOTE — Telephone Encounter (Signed)
Walid from the Medication Management Clinic at [facility] called to advise that the   ranolazine (RANEXA) 500 MG 12 hr tablet is not available through the Patient Assistant Program, he is not sure if it is due to a generic coming out soon or what. Lance Coon is wanting to know if Dr. Fletcher Anon can prescribe a different alternative. Please advise.

## 2019-11-06 NOTE — Telephone Encounter (Addendum)
Spoke with [facility] at medication management. The manufacturer of Ranexa is no longer offering patient assistance for the medication. So they are no longer able to get the medication through an assistance program. Lance Coon says it could be because Ranexa will be available in generic form soon. If the patient were to get if from a retail pharmacy the cost could be around $300 mo and he is doubtful the patient could afford it.  They will update the patient when she comes to pick up her refilled medications.  Forrest Moron that I will fwd the update to Dr. Fletcher Anon.

## 2019-11-07 NOTE — Telephone Encounter (Signed)
Ok thanks 

## 2019-11-14 ENCOUNTER — Telehealth: Payer: Self-pay | Admitting: Family

## 2019-11-14 ENCOUNTER — Ambulatory Visit: Payer: Self-pay | Admitting: Family

## 2019-11-14 NOTE — Telephone Encounter (Signed)
Patient did not show for her Heart Failure Clinic appointment on 11/14/19. Will attempt to reschedule.

## 2019-11-25 ENCOUNTER — Other Ambulatory Visit: Payer: Self-pay

## 2019-11-25 DIAGNOSIS — R7303 Prediabetes: Secondary | ICD-10-CM

## 2019-11-26 LAB — HEMOGLOBIN A1C
Est. average glucose Bld gHb Est-mCnc: 146 mg/dL
Hgb A1c MFr Bld: 6.7 % — ABNORMAL HIGH (ref 4.8–5.6)

## 2019-12-04 ENCOUNTER — Other Ambulatory Visit: Payer: Self-pay

## 2019-12-04 ENCOUNTER — Ambulatory Visit: Payer: Self-pay | Admitting: Gerontology

## 2019-12-04 DIAGNOSIS — E119 Type 2 diabetes mellitus without complications: Secondary | ICD-10-CM | POA: Insufficient documentation

## 2019-12-04 DIAGNOSIS — R21 Rash and other nonspecific skin eruption: Secondary | ICD-10-CM | POA: Insufficient documentation

## 2019-12-04 MED ORDER — METFORMIN HCL 1000 MG PO TABS: 500.0000 mg | ORAL_TABLET | Freq: Every day | ORAL | 0 refills | Status: DC

## 2019-12-04 MED ORDER — PREDNISONE 10 MG PO TABS: 10.0000 mg | ORAL_TABLET | Freq: Every day | ORAL | 0 refills | Status: DC

## 2019-12-04 NOTE — Progress Notes (Signed)
Established Patient Office Visit  Subjective:  Patient ID: Kathy [name], female    DOB: Aug 23, 1965  Age: 54 y.o. MRN: [id number]  CC: No chief complaint on file.  Patient consents to telephone visit and 2 patient identifiers was used to identify patient.  HPI Kathy [name] presents for c/o of pruritic mild erythematous rash to her abdomen, breast and back that started couple of weeks ago. She states that the rash erupted 2nd day after she ate barbecue flavored pork skin. She states that she has low tolerance to pork product. She states that she has been taking 50 mg of Benadrly bid which minimally relieve her symptoms. She denies chest tightness, wheezing and respiratory distress. She states that she had similar rash episode in October- November of 2020, applying Triamcinolone cream doesn't relieve symptoms and was treated with oral steroid. She was treated for generalized rash with Methylprednisolone dosepak during her 02/13/2019 clinic visit. Her HgbA1c done on 11/25/2019 was 6.7%, she denies hypoglycemic/ hyperglycemic symptoms, and peripheral neuropathy. Overall, she states that she's doing well, but for the rash, and she offers no further complaint.  Past Medical History:  Diagnosis Date  . Asthma   . CHF (congestive heart failure) ([place])   . Coronary artery disease   . Heart murmur   . Hyperlipidemia   . Hypertension   . Myocardial infarction ([place])   . Obesity   . Seizures ([place])     Past Surgical History:  Procedure Laterality Date  . ANTERIOR CRUCIATE LIGAMENT REPAIR    . BACK SURGERY     L4 and L5  . CARDIAC CATHETERIZATION  04/09/2016  . COLONOSCOPY    . COLONOSCOPY WITH PROPOFOL N/A 08/04/2019   Procedure: COLONOSCOPY WITH PROPOFOL;  Surgeon: Lin Landsman, MD;  Location: [facility] ENDOSCOPY;  Service: Gastroenterology;  Laterality: N/A;  . TONSILLECTOMY    . TONSILLECTOMY AND ADENOIDECTOMY  2008  . TUBAL LIGATION      Family History  Problem Relation Age of  Onset  . Kidney disease Sister   . Diabetes Sister   . COPD Sister   . Stroke Sister   . Diabetes Mother   . Stroke Mother   . Heart attack Mother   . Stroke Father   . Heart attack Father   . Asthma Daughter   . Asthma Son     Social History   Socioeconomic History  . Marital status: Divorced    Spouse name: Not on file  . Number of children: 2  . Years of education: pre-requisites   . Highest education level: GED or equivalent  Occupational History  . Occupation: unemployed  Tobacco Use  . Smoking status: Former Smoker    Packs/day: 0.20    Years: 20.00    Pack years: 4.00    Types: Cigarettes    Quit date: 08/16/2017    Years since quitting: 2.3  . Smokeless tobacco: Never Used  . Tobacco comment: Decreased smoking to less than 5 cigs/day  Vaping Use  . Vaping Use: Never used  Substance and Sexual Activity  . Alcohol use: No  . Drug use: No  . Sexual activity: Yes    Birth control/protection: Condom  Other Topics Concern  . Not on file  Social History Narrative   Currently on food stamps. They help somewhat but still difficult to buy food that she needs. Boyfriend helps pay rent with disability, they live together. Roof is leaking really badly. Lives in modular home. Social services bought  tarp which helped, but it deteriorated from heat.    Social Determinants of Health   Financial Resource Strain:   . Difficulty of Paying Living Expenses: Not on file  Food Insecurity:   . Worried About Charity fundraiser in the Last Year: Not on file  . Ran Out of Food in the Last Year: Not on file  Transportation Needs:   . Lack of Transportation (Medical): Not on file  . Lack of Transportation (Non-Medical): Not on file  Physical Activity:   . Days of Exercise per Week: Not on file  . Minutes of Exercise per Session: Not on file  Stress:   . Feeling of Stress : Not on file  Social Connections:   . Frequency of Communication with Friends and Family: Not on file  .  Frequency of Social Gatherings with Friends and Family: Not on file  . Attends Religious Services: Not on file  . Active Member of Clubs or Organizations: Not on file  . Attends Archivist Meetings: Not on file  . Marital Status: Not on file  Intimate Partner Violence:   . Fear of Current or Ex-Partner: Not on file  . Emotionally Abused: Not on file  . Physically Abused: Not on file  . Sexually Abused: Not on file    Outpatient Medications Prior to Visit  Medication Sig Dispense Refill  . clopidogrel (PLAVIX) 75 MG tablet Take 1 tablet (75 mg total) by mouth daily. 90 tablet 0  . diphenhydrAMINE (BENADRYL) 50 MG tablet Take 50 mg by mouth every 8 (eight) hours as needed for itching.    . furosemide (LASIX) 40 MG tablet Take 1 tablet (40 mg total) by mouth daily. 90 tablet 0  . isosorbide mononitrate (IMDUR) 60 MG 24 hr tablet Take 60 mg in 24 hour 120 tablet 0  . meclizine (ANTIVERT) 25 MG tablet Take 1 tablet (25 mg total) by mouth 3 (three) times daily as needed for dizziness. 90 tablet 6  . metoprolol tartrate (LOPRESSOR) 25 MG tablet Take  25 mg by mouth daily 180 tablet 0  . nitroGLYCERIN (NITROSTAT) 0.4 MG SL tablet 1 TABLET UNDER TONGUE AS NEEDED FOR CHEST PAIN EVERY 5 MINUTES FOR MAX OF 3 DOSES IN 15 MINUTES; IF NO RELIEF AFTER 1ST DOSE CALL 911 25 tablet 0  . pantoprazole (PROTONIX) 40 MG tablet Take 1 tablet (40 mg total) by mouth daily. 90 tablet 0  . potassium chloride SA (KLOR-CON) 20 MEQ tablet Take 1 tablet (20 mEq total) by mouth daily. 90 tablet 0  . ranolazine (RANEXA) 500 MG 12 hr tablet Take 1 tablet (500 mg total) by mouth 2 (two) times daily. 60 tablet 11  . simvastatin (ZOCOR) 20 MG tablet Take 1 tablet (20 mg total) by mouth daily. 90 tablet 0  . triamcinolone ointment (KENALOG) 0.5 % APPLY 1 APPLICATION 2 TIMES A DAY 80 g 0   Facility-Administered Medications Prior to Visit  Medication Dose Route Frequency Provider Last Rate Last Admin  . EPINEPHrine  (EPI-PEN) injection 0.3 mg  0.3 mg Intramuscular Once PRN Hugelmeyer, Alexis, DO        Allergies  Allergen Reactions  . Aspirin Anaphylaxis  . Celebrex [Celecoxib] Anaphylaxis and Hives  . Fentanyl Anaphylaxis  . Latex Hives  . Lisinopril Shortness Of Breath  . Percocet [Oxycodone-Acetaminophen] Shortness Of Breath  . Simvastatin-High Dose Hives and Shortness Of Breath    Can tolerate 20 mg dose.   Jonah Blue [Metaxalone] Anaphylaxis  .  Morphine Swelling  . Vicodin [Hydrocodone-Acetaminophen] Hives and Other (See Comments)    Feels like her tongue swells  . Albuterol Palpitations    ROS Review of Systems  Constitutional: Negative.   Respiratory: Negative.   Cardiovascular: Negative.   Endocrine: Negative.   Skin: Positive for rash (Puritic rash to back, breast and back.).  Neurological: Negative.       Objective:    Physical Exam No physical exam was done There were no vitals taken for this visit. Wt Readings from Last 3 Encounters:  10/30/19 (!) 311 lb 1.6 oz (141.1 kg)  08/28/19 (!) 316 lb 12 oz (143.7 kg)  08/04/19 (!) 301 lb (136.5 kg)     Health Maintenance Due  Topic Date Due  . Hepatitis C Screening  Never done  . OPHTHALMOLOGY EXAM  Never done  . COVID-19 Vaccine (1) Never done  . HIV Screening  Never done  . TETANUS/TDAP  Never done  . PAP SMEAR-Modifier  Never done  . MAMMOGRAM  Never done  . INFLUENZA VACCINE  11/09/2019  . URINE MICROALBUMIN  01/22/2020    There are no preventive care reminders to display for this patient.  Lab Results  Component Value Date   TSH 1.770 10/04/2017   Lab Results  Component Value Date   WBC 6.2 09/04/2018   HGB 13.8 09/04/2018   HCT 42.1 09/04/2018   MCV 86 09/04/2018   PLT 253 09/04/2018   Lab Results  Component Value Date   NA 143 06/04/2019   K 4.7 06/04/2019   CO2 22 06/04/2019   GLUCOSE 126 (H) 06/04/2019   BUN 9 06/04/2019   CREATININE 0.61 06/04/2019   BILITOT 0.3 01/22/2019   ALKPHOS 95  01/22/2019   AST 21 01/22/2019   ALT 24 01/22/2019   PROT 6.8 01/22/2019   ALBUMIN 3.4 (L) 01/22/2019   CALCIUM 9.3 06/04/2019   ANIONGAP 8 04/09/2016   Lab Results  Component Value Date   CHOL 149 01/22/2019   Lab Results  Component Value Date   HDL 27 (L) 01/22/2019   Lab Results  Component Value Date   LDLCALC 93 01/22/2019   Lab Results  Component Value Date   TRIG 167 (H) 01/22/2019   Lab Results  Component Value Date   CHOLHDL 5.5 (H) 01/22/2019   Lab Results  Component Value Date   HGBA1C 6.7 (H) 11/25/2019      Assessment & Plan:   1. Type 2 diabetes mellitus without complication, without long-term current use of insulin (HCC) - Her HgbA1c was 6.7%,  She will start Metformin, was educated on medication side effects and advised to notify clinic. She was advised to continue on low carb/non concentrated diet and exercise as tolerated. - metFORMIN (GLUCOPHAGE) 1000 MG tablet; Take 0.5 tablets (500 mg total) by mouth daily with breakfast.  Dispense: 30 tablet; Refill: 0  2. Rash - She was advised to take 10 mg Prednisone daily x 5 days, advised on medication side effects and to notify clinic. She was advised to go to the ED for worsening rash and symptoms. - predniSONE (DELTASONE) 10 MG tablet; Take 1 tablet (10 mg total) by mouth daily with breakfast.  Dispense: 5 tablet; Refill: 0     Follow-up: Return in about 4 weeks (around 01/01/2020), or if symptoms worsen or fail to improve.    Chaka Boyson Jerold Coombe, NP

## 2019-12-04 NOTE — Patient Instructions (Signed)
Carbohydrate Counting for Diabetes Mellitus, Adult  Carbohydrate counting is a method of keeping track of how many carbohydrates you eat. Eating carbohydrates naturally increases the amount of sugar (glucose) in the blood. Counting how many carbohydrates you eat helps keep your blood glucose within normal limits, which helps you manage your diabetes (diabetes mellitus). It is important to know how many carbohydrates you can safely have in each meal. This is different for every person. A diet and nutrition specialist (registered dietitian) can help you make a meal plan and calculate how many carbohydrates you should have at each meal and snack. Carbohydrates are found in the following foods:  Grains, such as breads and cereals.  Dried beans and soy products.  Starchy vegetables, such as potatoes, peas, and corn.  Fruit and fruit juices.  Milk and yogurt.  Sweets and snack foods, such as cake, cookies, candy, chips, and soft drinks. How do I count carbohydrates? There are two ways to count carbohydrates in food. You can use either of the methods or a combination of both. Reading "Nutrition Facts" on packaged food The "Nutrition Facts" list is included on the labels of almost all packaged foods and beverages in the [place] It includes:  The serving size.  Information about nutrients in each serving, including the grams (g) of carbohydrate per serving. To use the "Nutrition Facts":  Decide how many servings you will have.  Multiply the number of servings by the number of carbohydrates per serving.  The resulting number is the total amount of carbohydrates that you will be having. Learning standard serving sizes of other foods When you eat carbohydrate foods that are not packaged or do not include "Nutrition Facts" on the label, you need to measure the servings in order to count the amount of carbohydrates:  Measure the foods that you will eat with a food scale or measuring cup, if  needed.  Decide how many standard-size servings you will eat.  Multiply the number of servings by 15. Most carbohydrate-rich foods have about 15 g of carbohydrates per serving. ? For example, if you eat 8 oz (170 g) of strawberries, you will have eaten 2 servings and 30 g of carbohydrates (2 servings x 15 g = 30 g).  For foods that have more than one food mixed, such as soups and casseroles, you must count the carbohydrates in each food that is included. The following list contains standard serving sizes of common carbohydrate-rich foods. Each of these servings has about 15 g of carbohydrates:   hamburger bun or  English muffin.   oz (15 mL) syrup.   oz (14 g) jelly.  1 slice of bread.  1 six-inch tortilla.  3 oz (85 g) cooked rice or pasta.  4 oz (113 g) cooked dried beans.  4 oz (113 g) starchy vegetable, such as peas, corn, or potatoes.  4 oz (113 g) hot cereal.  4 oz (113 g) mashed potatoes or  of a large baked potato.  4 oz (113 g) canned or frozen fruit.  4 oz (120 mL) fruit juice.  4-6 crackers.  6 chicken nuggets.  6 oz (170 g) unsweetened dry cereal.  6 oz (170 g) plain fat-free yogurt or yogurt sweetened with artificial sweeteners.  8 oz (240 mL) milk.  8 oz (170 g) fresh fruit or one small piece of fruit.  24 oz (680 g) popped popcorn. Example of carbohydrate counting Sample meal  3 oz (85 g) chicken breast.  6 oz (170 g)   brown rice.  4 oz (113 g) corn.  8 oz (240 mL) milk.  8 oz (170 g) strawberries with sugar-free whipped topping. Carbohydrate calculation 1. Identify the foods that contain carbohydrates: ? Rice. ? Corn. ? Milk. ? Strawberries. 2. Calculate how many servings you have of each food: ? 2 servings rice. ? 1 serving corn. ? 1 serving milk. ? 1 serving strawberries. 3. Multiply each number of servings by 15 g: ? 2 servings rice x 15 g = 30 g. ? 1 serving corn x 15 g = 15 g. ? 1 serving milk x 15 g = 15 g. ? 1  serving strawberries x 15 g = 15 g. 4. Add together all of the amounts to find the total grams of carbohydrates eaten: ? 30 g + 15 g + 15 g + 15 g = 75 g of carbohydrates total. Summary  Carbohydrate counting is a method of keeping track of how many carbohydrates you eat.  Eating carbohydrates naturally increases the amount of sugar (glucose) in the blood.  Counting how many carbohydrates you eat helps keep your blood glucose within normal limits, which helps you manage your diabetes.  A diet and nutrition specialist (registered dietitian) can help you make a meal plan and calculate how many carbohydrates you should have at each meal and snack. This information is not intended to replace advice given to you by your health care provider. Make sure you discuss any questions you have with your health care provider. Document Revised: 10/19/2016 Document Reviewed: 09/08/2015 Elsevier Patient Education  2020 [employer] Inc.  

## 2019-12-09 ENCOUNTER — Other Ambulatory Visit: Payer: Self-pay

## 2019-12-09 ENCOUNTER — Encounter: Payer: Self-pay | Admitting: Family

## 2019-12-09 ENCOUNTER — Ambulatory Visit: Payer: Self-pay | Attending: Family | Admitting: Family

## 2019-12-09 VITALS — BP 138/77 | HR 66 | Resp 18 | Ht 67.0 in | Wt 315.5 lb

## 2019-12-09 DIAGNOSIS — Z79899 Other long term (current) drug therapy: Secondary | ICD-10-CM | POA: Insufficient documentation

## 2019-12-09 DIAGNOSIS — Z7984 Long term (current) use of oral hypoglycemic drugs: Secondary | ICD-10-CM | POA: Insufficient documentation

## 2019-12-09 DIAGNOSIS — Z885 Allergy status to narcotic agent status: Secondary | ICD-10-CM | POA: Insufficient documentation

## 2019-12-09 DIAGNOSIS — Z7902 Long term (current) use of antithrombotics/antiplatelets: Secondary | ICD-10-CM | POA: Insufficient documentation

## 2019-12-09 DIAGNOSIS — E119 Type 2 diabetes mellitus without complications: Secondary | ICD-10-CM | POA: Insufficient documentation

## 2019-12-09 DIAGNOSIS — I2510 Atherosclerotic heart disease of native coronary artery without angina pectoris: Secondary | ICD-10-CM | POA: Insufficient documentation

## 2019-12-09 DIAGNOSIS — I5032 Chronic diastolic (congestive) heart failure: Secondary | ICD-10-CM | POA: Insufficient documentation

## 2019-12-09 DIAGNOSIS — J45909 Unspecified asthma, uncomplicated: Secondary | ICD-10-CM | POA: Insufficient documentation

## 2019-12-09 DIAGNOSIS — Z87891 Personal history of nicotine dependence: Secondary | ICD-10-CM | POA: Insufficient documentation

## 2019-12-09 DIAGNOSIS — Z8616 Personal history of COVID-19: Secondary | ICD-10-CM | POA: Insufficient documentation

## 2019-12-09 DIAGNOSIS — E669 Obesity, unspecified: Secondary | ICD-10-CM | POA: Insufficient documentation

## 2019-12-09 DIAGNOSIS — I10 Essential (primary) hypertension: Secondary | ICD-10-CM

## 2019-12-09 DIAGNOSIS — Z8249 Family history of ischemic heart disease and other diseases of the circulatory system: Secondary | ICD-10-CM | POA: Insufficient documentation

## 2019-12-09 DIAGNOSIS — R569 Unspecified convulsions: Secondary | ICD-10-CM | POA: Insufficient documentation

## 2019-12-09 DIAGNOSIS — I252 Old myocardial infarction: Secondary | ICD-10-CM | POA: Insufficient documentation

## 2019-12-09 DIAGNOSIS — I110 Hypertensive heart disease with heart failure: Secondary | ICD-10-CM | POA: Insufficient documentation

## 2019-12-09 DIAGNOSIS — Z886 Allergy status to analgesic agent status: Secondary | ICD-10-CM | POA: Insufficient documentation

## 2019-12-09 DIAGNOSIS — R0683 Snoring: Secondary | ICD-10-CM | POA: Insufficient documentation

## 2019-12-09 DIAGNOSIS — Z7952 Long term (current) use of systemic steroids: Secondary | ICD-10-CM | POA: Insufficient documentation

## 2019-12-09 DIAGNOSIS — Z888 Allergy status to other drugs, medicaments and biological substances status: Secondary | ICD-10-CM | POA: Insufficient documentation

## 2019-12-09 DIAGNOSIS — E785 Hyperlipidemia, unspecified: Secondary | ICD-10-CM | POA: Insufficient documentation

## 2019-12-09 LAB — GLUCOSE, CAPILLARY: Glucose-Capillary: 174 mg/dL — ABNORMAL HIGH (ref 70–99)

## 2019-12-09 NOTE — Patient Instructions (Signed)
Continue weighing daily and call for an overnight weight gain of > 2 pounds or a weekly weight gain of >5 pounds. 

## 2019-12-09 NOTE — Progress Notes (Signed)
Patient ID: Kathy [name], female    DOB: July 21, 1965, 54 y.o.   MRN: [id number]  HPI  Kathy [name] is a 54 y/o female with a history of asthma, CAD, hyperlipidemia, HTN, seizures, DM, previous tobacco use and chronic heart failure.   Echo report from 02/27/17 reviewed and showed an EF of 55-60%.  Has not been admitted or been in the ED in the last 6 months.   She presents today for her initial visit with a chief complaint of minimal shortness of breath upon moderate exertion. She describes this as chronic in nature having been present for several years. She has associated fatigue, chest pain, pedal edema, palpitations, light-headedness, apnea, snoring and syncopal sensations along with this. She denies any abdominal distention, cough or head congestion.   Drinking a lot of fluids daily and says that she can drink 8-10 water bottles daily because her mouth stays dry. Does not have scales to weigh herself. Significant other that is with her says that she does snore at night and also stops breathing while she sleeps.   Past Medical History:  Diagnosis Date  . Asthma   . CHF (congestive heart failure) ([place])   . Coronary artery disease   . Diabetes mellitus without complication ([place])   . Heart murmur   . Hyperlipidemia   . Hypertension   . Myocardial infarction ([place])   . Obesity   . Seizures ([place])    Past Surgical History:  Procedure Laterality Date  . ANTERIOR CRUCIATE LIGAMENT REPAIR    . BACK SURGERY     L4 and L5  . CARDIAC CATHETERIZATION  04/09/2016  . COLONOSCOPY    . COLONOSCOPY WITH PROPOFOL N/A 08/04/2019   Procedure: COLONOSCOPY WITH PROPOFOL;  Surgeon: Lin Landsman, MD;  Location[facility] ENDOSCOPY;  Service: Gastroenterology;  Laterality: N/A;  . TONSILLECTOMY    . TONSILLECTOMY AND ADENOIDECTOMY  2008  . TUBAL LIGATION     Family History  Problem Relation Age of Onset  . Kidney disease Sister   . Diabetes Sister   . COPD Sister   . Stroke Sister   . Diabetes  Mother   . Stroke Mother   . Heart attack Mother   . Stroke Father   . Heart attack Father   . Asthma Daughter   . Asthma Son    Social History   Tobacco Use  . Smoking status: Former Smoker    Packs/day: 0.20    Years: 20.00    Pack years: 4.00    Types: Cigarettes    Quit date: 08/16/2017    Years since quitting: 2.3  . Smokeless tobacco: Never Used  . Tobacco comment: Decreased smoking to less than 5 cigs/day  Substance Use Topics  . Alcohol use: No   Allergies  Allergen Reactions  . Aspirin Anaphylaxis  . Celebrex [Celecoxib] Anaphylaxis and Hives  . Fentanyl Anaphylaxis  . Latex Hives  . Lisinopril Shortness Of Breath  . Percocet [Oxycodone-Acetaminophen] Shortness Of Breath  . Simvastatin-High Dose Hives and Shortness Of Breath    Can tolerate 20 mg dose.   Jonah Blue [Metaxalone] Anaphylaxis  . Morphine Swelling  . Vicodin [Hydrocodone-Acetaminophen] Hives and Other (See Comments)    Feels like her tongue swells  . Albuterol Palpitations   Prior to Admission medications   Medication Sig Start Date End Date Taking? Authorizing Provider  clopidogrel (PLAVIX) 75 MG tablet Take 1 tablet (75 mg total) by mouth daily. 10/30/19  Yes Iloabachie, Chioma E, NP  diphenhydrAMINE (BENADRYL) 50 MG tablet Take 50 mg by mouth 2 (two) times daily.    Yes [provider]  furosemide (LASIX) 40 MG tablet Take 1 tablet (40 mg total) by mouth daily. 10/30/19  Yes Iloabachie, Chioma E, NP  isosorbide mononitrate (IMDUR) 60 MG 24 hr tablet Take 60 mg in 24 hour 10/30/19  Yes Iloabachie, Chioma E, NP  meclizine (ANTIVERT) 25 MG tablet Take 1 tablet (25 mg total) by mouth 3 (three) times daily as needed for dizziness. 12/16/15  Yes McGowan, Larene Beach A, PA-C  metFORMIN (GLUCOPHAGE) 1000 MG tablet Take 0.5 tablets (500 mg total) by mouth daily with breakfast. 12/04/19  Yes Iloabachie, Chioma E, NP  metoprolol tartrate (LOPRESSOR) 25 MG tablet Take  25 mg by mouth daily 10/30/19  Yes  Iloabachie, Chioma E, NP  nitroGLYCERIN (NITROSTAT) 0.4 MG SL tablet 1 TABLET UNDER TONGUE AS NEEDED FOR CHEST PAIN EVERY 5 MINUTES FOR MAX OF 3 DOSES IN 15 MINUTES; IF NO RELIEF AFTER 1ST DOSE CALL 911 05/13/19  Yes McGowan, Shannon A, PA-C  pantoprazole (PROTONIX) 40 MG tablet Take 1 tablet (40 mg total) by mouth daily. 10/30/19  Yes Iloabachie, Chioma E, NP  potassium chloride SA (KLOR-CON) 20 MEQ tablet Take 1 tablet (20 mEq total) by mouth daily. 10/30/19  Yes Iloabachie, Chioma E, NP  predniSONE (DELTASONE) 10 MG tablet Take 1 tablet (10 mg total) by mouth daily with breakfast. 12/04/19  Yes Iloabachie, Chioma E, NP  simvastatin (ZOCOR) 20 MG tablet Take 1 tablet (20 mg total) by mouth daily. 10/30/19  Yes Iloabachie, Chioma E, NP  ranolazine (RANEXA) 500 MG 12 hr tablet Take 1 tablet (500 mg total) by mouth 2 (two) times daily. Patient not taking: Reported on 12/09/2019 10/31/19   Wellington Hampshire, MD    Review of Systems  Constitutional: Positive for fatigue. Negative for appetite change.  HENT: Negative for congestion, postnasal drip and sore throat.   Eyes: Negative.   Respiratory: Positive for apnea and shortness of breath. Negative for cough.   Cardiovascular: Positive for chest pain, palpitations and leg swelling (around left ankle).  Gastrointestinal: Negative for abdominal distention and abdominal pain.  Endocrine: Negative.   Genitourinary: Negative.   Musculoskeletal: Negative for back pain and neck pain.  Skin: Negative.   Allergic/Immunologic: Positive for food allergies.  Neurological: Positive for syncope ("blacking out sensation") and light-headedness.  Hematological: Negative for adenopathy. Does not bruise/bleed easily.  Psychiatric/Behavioral: Negative for sleep disturbance. The patient is nervous/anxious.     Vitals:   12/09/19 1338  BP: 138/77  Pulse: 66  Resp: 18  SpO2: 98%  Weight: (!) 315 lb 8 oz (143.1 kg)  Height: 5\' 7"  (1.702 m)   Wt Readings from Last 3  Encounters:  12/09/19 (!) 315 lb 8 oz (143.1 kg)  10/30/19 (!) 311 lb 1.6 oz (141.1 kg)  08/28/19 (!) 316 lb 12 oz (143.7 kg)   Lab Results  Component Value Date   CREATININE 0.61 06/04/2019   CREATININE 0.82 01/22/2019   CREATININE 0.79 09/04/2018     Physical Exam Vitals and nursing note reviewed.  Constitutional:      Appearance: She is well-developed.  HENT:     Head: Normocephalic and atraumatic.  Neck:     Vascular: No JVD.  Cardiovascular:     Rate and Rhythm: Normal rate and regular rhythm.  Pulmonary:     Effort: Pulmonary effort is normal. No respiratory distress.     Breath sounds: No wheezing  or rales.  Abdominal:     Palpations: Abdomen is soft.     Tenderness: There is no abdominal tenderness.  Musculoskeletal:     Cervical back: Neck supple.     Right lower leg: No tenderness. No edema.     Left lower leg: No tenderness. Edema (trace around ankle) present.  Skin:    General: Skin is warm and dry.  Neurological:     General: No focal deficit present.     Mental Status: She is alert and oriented to person, place, and time.  Psychiatric:        Mood and Affect: Mood normal.        Behavior: Behavior normal.     Assessment & Plan:  1: Chronic heart failure with preserved ejection fraction- - NYHA class II - euvolemic today - scales given and she was instructed to weigh every morning and call for an overnight weight gain of >2 pounds or a weekly weight gain of >5 pounds - not adding salt but does eat out "often" and is aware that she's getting more salt when she eats out; reviewed the importance of following a low sodium diet and written dietary information and low sodium cookbook were given to her - drinking 8-10 water bottles daily and discussed that that was too much fluid intake and that she needed to be closer to 60-64 ounces/ day unless she's sweating - Echo scheduled for 01/06/20 as patient currently has [facility] assistance - saw PCP Fletcher Anon) 08/28/19 -  has previously had covid infection and is greatly concerned about the vaccine because of all her allergies  2: HTN- - BP looks good today - saw PCP (Iloabachie) at [facility] 12/04/19 - BMP 06/04/19 reviewed and showed sodium 143, potassium 4.7, creatinine 0.61 and GFR 104  3: DM- - A1c 11/25/19 was 6.7% - nonfasting glucose in clinic today was 174  4: Snoring- - messaged PCP to see how we can get a sleep study ordered and was informed that they refer to [facility] neurology for the test and cpap equipment if needed - PCP says she will discuss with patient at her appointment on 12/11/19 as she will have to fill out paperwork to be eligible for [facility] assistance   Medication bottles reviewed.  Return in 1 month or sooner for any questions/problems before then.

## 2019-12-11 ENCOUNTER — Ambulatory Visit: Payer: Self-pay | Admitting: Family Medicine

## 2019-12-11 ENCOUNTER — Other Ambulatory Visit: Payer: Self-pay

## 2019-12-11 VITALS — BP 124/84 | HR 81 | Temp 97.4°F | Ht 67.0 in | Wt 317.0 lb

## 2019-12-11 DIAGNOSIS — L209 Atopic dermatitis, unspecified: Secondary | ICD-10-CM

## 2019-12-11 MED ORDER — METHYLPREDNISOLONE 4 MG PO TBPK: ORAL_TABLET | ORAL | 0 refills | Status: DC

## 2019-12-11 NOTE — Patient Instructions (Signed)

## 2019-12-11 NOTE — Progress Notes (Signed)
  [facility]   Progress Note: General Provider: Lanae Boast, FNP  SUBJECTIVE:   Kathy [name] is a 54 y.o. female who  has a past medical history of Asthma, CHF (congestive heart failure) ([place]), Coronary artery disease, Diabetes mellitus without complication ([place]), Heart murmur, Hyperlipidemia, Hypertension, Myocardial infarction ([place]), Obesity, and Seizures ([place]).. Patient presents today for Follow-up Patient states that she ate a new food and a rash occurred. She states that she was on prednisone x 5 days with slight relief. She would like to have another round of steroids. She denies new cosmetics. She has 3 dogs, but states that they have flea collars. No one else in the home with similar symptoms. She does have a history of atopic dermatitis. Relieved with taking benadryl.  She states that she started taking metformin last week without problems.  Review of Systems  Constitutional: Negative.   HENT: Negative.   Respiratory: Negative.   Cardiovascular: Negative.   Skin: Positive for rash.  Psychiatric/Behavioral: Negative.      OBJECTIVE: BP 124/84 (BP Location: Right Arm, Patient Position: Sitting)   Pulse 81   Temp (!) 97.4 F (36.3 C)   Ht 5\' 7"  (1.702 m)   Wt (!) 317 lb (143.8 kg)   SpO2 94%   BMI 49.65 kg/m   Wt Readings from Last 3 Encounters:  12/11/19 (!) 317 lb (143.8 kg)  12/09/19 (!) 315 lb 8 oz (143.1 kg)  10/30/19 (!) 311 lb 1.6 oz (141.1 kg)     Physical Exam Constitutional:      General: She is not in acute distress.    Appearance: Normal appearance. She is obese.  Skin:    General: Skin is warm.     Findings: Rash present. Rash is macular and papular.       Neurological:     Mental Status: She is alert and oriented to person, place, and time.  Psychiatric:        Mood and Affect: Mood and affect normal.        Speech: Speech normal.        Behavior: Behavior normal. Behavior is cooperative.     ASSESSMENT/PLAN:  1.  Atopic dermatitis, unspecified type - methylPREDNISolone (MEDROL DOSEPAK) 4 MG TBPK tablet; Take as directed on pack  Dispense: 21 tablet; Refill: 0  Education given on atopic derm. Advised patient that steroids can increase her blood glucose. She will need to monitor closely during this time.   Return if symptoms worsen or fail to improve, for already scheduled.    The patient was given clear instructions to go to ER or return to medical center if symptoms do not improve, worsen or new problems develop. The patient verbalized understanding and agreed with plan of care.   Ms. Doug Sou. Nathaneil Canary, FNP-BC [place]

## 2020-01-01 ENCOUNTER — Encounter: Payer: Self-pay | Admitting: Gerontology

## 2020-01-01 ENCOUNTER — Ambulatory Visit: Payer: Self-pay | Admitting: Gerontology

## 2020-01-01 ENCOUNTER — Other Ambulatory Visit: Payer: Self-pay

## 2020-01-01 VITALS — BP 122/70 | HR 69 | Resp 18 | Ht 67.0 in | Wt 322.1 lb

## 2020-01-01 DIAGNOSIS — H9190 Unspecified hearing loss, unspecified ear: Secondary | ICD-10-CM | POA: Insufficient documentation

## 2020-01-01 DIAGNOSIS — I10 Essential (primary) hypertension: Secondary | ICD-10-CM

## 2020-01-01 DIAGNOSIS — Z0000 Encounter for general adult medical examination without abnormal findings: Secondary | ICD-10-CM

## 2020-01-01 DIAGNOSIS — H9191 Unspecified hearing loss, right ear: Secondary | ICD-10-CM

## 2020-01-01 DIAGNOSIS — M25512 Pain in left shoulder: Secondary | ICD-10-CM

## 2020-01-01 DIAGNOSIS — E119 Type 2 diabetes mellitus without complications: Secondary | ICD-10-CM

## 2020-01-01 DIAGNOSIS — M79662 Pain in left lower leg: Secondary | ICD-10-CM | POA: Insufficient documentation

## 2020-01-01 MED ORDER — METFORMIN HCL 1000 MG PO TABS: 500.0000 mg | ORAL_TABLET | Freq: Every day | ORAL | 3 refills | Status: DC

## 2020-01-01 MED ORDER — EPINEPHRINE 0.3 MG/0.3ML IJ SOAJ: 0.3000 mg | INTRAMUSCULAR | 0 refills | Status: DC | PRN

## 2020-01-01 NOTE — Progress Notes (Signed)
Established Patient Office Visit  Subjective:  Patient ID: Kathy [name], female    DOB: Jul 18, 1965  Age: 54 y.o. MRN: [id number]  CC:  Chief Complaint  Patient presents with  . Diabetes  . Hypertension    HPI Kathy [name] presents for follow up of her type 2 diabetes mellitus and hypertension. Her HgbA1c done on 11/25/2019 was 6.7%, she denies hypoglycemic/ hyperglycemic symptoms, and peripheral neuropathy. Her blood glucose in clinic is 111. She reports that she is tolerating her metformin well. Denies abdominal pain, diarrhea, or nausea. She reports that she does not check her blood pressure at home. She is compliant with her blood pressure medication. Denies dizziness or headaches. She states that she has been increasing her activity by walking, but has noticed L calf pain for the last 3 weeks when exercising. States that the pain radiates to her hip and ankle. She states that the pain is "cramping" and "sharp." She states that it only happens with exercise and states that it is relieved by rest. She has not tried any treatments at home and states that it is 10/10 when she experiences it. Hx of ACL surgery in that L knee. No redness, warmth, or swelling noted. She also reports L shoulder pain for the last 2 months that radiates into her neck and to her forearm. She states that she cannot lift it over her head and it hurts to move her arm forward or backward. States that it hurts to lay on it. She denies any treatments at home and rates the pain 8/10. Reports a hx of breaking that L clavicle. Patient also reports R sided hearing loss x4 months. Denies injury, discharge or pain. Overall, patient states that she is doing well and offers no further complaint.   Past Medical History:  Diagnosis Date  . Asthma   . CHF (congestive heart failure) ([place])   . Coronary artery disease   . Diabetes mellitus without complication ([place])   . Heart murmur   . Hyperlipidemia   . Hypertension   .  Myocardial infarction ([place])   . Obesity   . Seizures ([place])     Past Surgical History:  Procedure Laterality Date  . ANTERIOR CRUCIATE LIGAMENT REPAIR    . BACK SURGERY     L4 and L5  . CARDIAC CATHETERIZATION  04/09/2016  . COLONOSCOPY    . COLONOSCOPY WITH PROPOFOL N/A 08/04/2019   Procedure: COLONOSCOPY WITH PROPOFOL;  Surgeon: Lin Landsman, MD;  Location: [facility] ENDOSCOPY;  Service: Gastroenterology;  Laterality: N/A;  . EMPYEMA DRAINAGE     at age 44  . TONSILLECTOMY    . TONSILLECTOMY AND ADENOIDECTOMY  2008  . TUBAL LIGATION      Family History  Problem Relation Age of Onset  . Kidney disease Sister   . Diabetes Sister   . COPD Sister   . Stroke Sister   . Diabetes Mother   . Stroke Mother   . Heart attack Mother   . Stroke Father   . Heart attack Father   . Asthma Daughter   . Asthma Son     Social History   Socioeconomic History  . Marital status: Divorced    Spouse name: Not on file  . Number of children: 2  . Years of education: pre-requisites   . Highest education level: GED or equivalent  Occupational History  . Occupation: unemployed  Tobacco Use  . Smoking status: Former Smoker    Packs/day: 0.20  Years: 20.00    Pack years: 4.00    Types: Cigarettes    Quit date: 08/16/2017    Years since quitting: 2.3  . Smokeless tobacco: Never Used  . Tobacco comment: Decreased smoking to less than 5 cigs/day  Vaping Use  . Vaping Use: Never used  Substance and Sexual Activity  . Alcohol use: No  . Drug use: No  . Sexual activity: Yes    Birth control/protection: Condom  Other Topics Concern  . Not on file  Social History Narrative   Currently on food stamps. They help somewhat but still difficult to buy food that she needs. Boyfriend helps pay rent with disability, they live together. Roof is leaking really badly. Lives in modular home. Social services bought tarp which helped, but it deteriorated from heat.    Social Determinants of Health    Financial Resource Strain:   . Difficulty of Paying Living Expenses: Not on file  Food Insecurity:   . Worried About Charity fundraiser in the Last Year: Not on file  . Ran Out of Food in the Last Year: Not on file  Transportation Needs:   . Lack of Transportation (Medical): Not on file  . Lack of Transportation (Non-Medical): Not on file  Physical Activity:   . Days of Exercise per Week: Not on file  . Minutes of Exercise per Session: Not on file  Stress:   . Feeling of Stress : Not on file  Social Connections:   . Frequency of Communication with Friends and Family: Not on file  . Frequency of Social Gatherings with Friends and Family: Not on file  . Attends Religious Services: Not on file  . Active Member of Clubs or Organizations: Not on file  . Attends Archivist Meetings: Not on file  . Marital Status: Not on file  Intimate Partner Violence:   . Fear of Current or Ex-Partner: Not on file  . Emotionally Abused: Not on file  . Physically Abused: Not on file  . Sexually Abused: Not on file    Outpatient Medications Prior to Visit  Medication Sig Dispense Refill  . clopidogrel (PLAVIX) 75 MG tablet Take 1 tablet (75 mg total) by mouth daily. 90 tablet 0  . diphenhydrAMINE (BENADRYL) 50 MG tablet Take 50 mg by mouth 2 (two) times daily.     . furosemide (LASIX) 40 MG tablet Take 1 tablet (40 mg total) by mouth daily. 90 tablet 0  . isosorbide mononitrate (IMDUR) 60 MG 24 hr tablet Take 60 mg in 24 hour (Patient taking differently: 2 (two) times daily. Take 60 mg in 24 hour) 120 tablet 0  . meclizine (ANTIVERT) 25 MG tablet Take 1 tablet (25 mg total) by mouth 3 (three) times daily as needed for dizziness. 90 tablet 6  . metoprolol tartrate (LOPRESSOR) 25 MG tablet Take  25 mg by mouth daily (Patient taking differently: 2 (two) times daily. Take  25 mg by mouth daily) 180 tablet 0  . nitroGLYCERIN (NITROSTAT) 0.4 MG SL tablet 1 TABLET UNDER TONGUE AS NEEDED FOR CHEST  PAIN EVERY 5 MINUTES FOR MAX OF 3 DOSES IN 15 MINUTES; IF NO RELIEF AFTER 1ST DOSE CALL 911 25 tablet 0  . pantoprazole (PROTONIX) 40 MG tablet Take 1 tablet (40 mg total) by mouth daily. 90 tablet 0  . potassium chloride SA (KLOR-CON) 20 MEQ tablet Take 1 tablet (20 mEq total) by mouth daily. 90 tablet 0  . simvastatin (ZOCOR) 20 MG tablet  Take 1 tablet (20 mg total) by mouth daily. 90 tablet 0  . metFORMIN (GLUCOPHAGE) 1000 MG tablet Take 0.5 tablets (500 mg total) by mouth daily with breakfast. (Patient taking differently: Take 1,000 mg by mouth daily with breakfast. ) 30 tablet 0  . ranolazine (RANEXA) 500 MG 12 hr tablet Take 1 tablet (500 mg total) by mouth 2 (two) times daily. (Patient not taking: Reported on 12/09/2019) 60 tablet 11  . methylPREDNISolone (MEDROL DOSEPAK) 4 MG TBPK tablet Take as directed on pack (Patient not taking: Reported on 01/01/2020) 21 tablet 0  . EPINEPHrine (EPI-PEN) injection 0.3 mg      No facility-administered medications prior to visit.    Allergies  Allergen Reactions  . Aspirin Anaphylaxis  . Celebrex [Celecoxib] Anaphylaxis and Hives  . Fentanyl Anaphylaxis  . Latex Hives  . Lisinopril Shortness Of Breath  . Morphine Anaphylaxis  . Percocet [Oxycodone-Acetaminophen] Hives and Shortness Of Breath  . Simvastatin-High Dose Hives and Shortness Of Breath    Can tolerate 20 mg dose.   Jonah Blue [Metaxalone] Anaphylaxis  . Pork-Derived Metallurgist  . Vicodin [Hydrocodone-Acetaminophen] Hives and Other (See Comments)    Feels like her tongue swells  . Albuterol Palpitations    ROS Review of Systems  Constitutional: Positive for appetite change.       Decreased since starting metformin  HENT: Positive for hearing loss.        R ear  Respiratory: Negative.   Cardiovascular: Negative.   Musculoskeletal: Positive for arthralgias and myalgias.       Pain in her L shoulder and L calf      Objective:    Physical Exam Constitutional:       Appearance: She is obese.  HENT:     Right Ear: Tympanic membrane, ear canal and external ear normal. Decreased hearing noted.     Left Ear: Hearing, tympanic membrane, ear canal and external ear normal.     Ears:     Weber exam findings: lateralizes left.    Right Rinne: BC > AC.    Left Rinne: AC > BC. Cardiovascular:     Rate and Rhythm: Normal rate. Rhythm irregular.  Pulmonary:     Breath sounds: Normal breath sounds.  Musculoskeletal:     Left shoulder: Decreased range of motion.     Right lower leg: Normal.     Left lower leg: Normal.  Neurological:     Mental Status: She is alert.     BP 122/70 (BP Location: Right Arm, Patient Position: Sitting)   Pulse 69   Resp 18   Ht 5\' 7"  (1.702 m)   Wt (!) 322 lb 1.6 oz (146.1 kg)   SpO2 96%   BMI 50.45 kg/m  Wt Readings from Last 3 Encounters:  01/01/20 (!) 322 lb 1.6 oz (146.1 kg)  12/11/19 (!) 317 lb (143.8 kg)  12/09/19 (!) 315 lb 8 oz (143.1 kg)   Pt advised to lose weight.   Health Maintenance Due  Topic Date Due  . Hepatitis C Screening  Never done  . OPHTHALMOLOGY EXAM  Never done  . COVID-19 Vaccine (1) Never done  . HIV Screening  Never done  . TETANUS/TDAP  Never done  . PAP SMEAR-Modifier  Never done  . MAMMOGRAM  Never done  . INFLUENZA VACCINE  11/09/2019  . URINE MICROALBUMIN  01/22/2020    There are no preventive care reminders to display for this patient.  Lab Results  Component Value  Date   TSH 1.770 10/04/2017   Lab Results  Component Value Date   WBC 6.2 09/04/2018   HGB 13.8 09/04/2018   HCT 42.1 09/04/2018   MCV 86 09/04/2018   PLT 253 09/04/2018   Lab Results  Component Value Date   NA 143 06/04/2019   K 4.7 06/04/2019   CO2 22 06/04/2019   GLUCOSE 126 (H) 06/04/2019   BUN 9 06/04/2019   CREATININE 0.61 06/04/2019   BILITOT 0.3 01/22/2019   ALKPHOS 95 01/22/2019   AST 21 01/22/2019   ALT 24 01/22/2019   PROT 6.8 01/22/2019   ALBUMIN 3.4 (L) 01/22/2019   CALCIUM 9.3  06/04/2019   ANIONGAP 8 04/09/2016   Lab Results  Component Value Date   CHOL 149 01/22/2019   Lab Results  Component Value Date   HDL 27 (L) 01/22/2019   Lab Results  Component Value Date   LDLCALC 93 01/22/2019   Lab Results  Component Value Date   TRIG 167 (H) 01/22/2019   Lab Results  Component Value Date   CHOLHDL 5.5 (H) 01/22/2019   Lab Results  Component Value Date   HGBA1C 6.7 (H) 11/25/2019      Assessment & Plan:   1. Essential hypertension -Continue taking your blood pressure medications as prescribed.  -Adhere to the DASH diet.  - Basic Metabolic Panel (BMET); Future  2. Type 2 diabetes mellitus without complication, without long-term current use of insulin ([facility]) -Continue diet changes (low carb, low concentrated sugar) -Increase activity and exercise daily.  - metFORMIN (GLUCOPHAGE) 1000 MG tablet; Take 0.5 tablets (500 mg total) by mouth daily with breakfast.  Dispense: 30 tablet; Refill: 3 - HgB A1c; Future - Urine Microalbumin w/creat. ratio; Future  3. Class 3 severe obesity with body mass index (BMI) of 45.0 to 49.9 in adult, unspecified obesity type, unspecified whether serious comorbidity present ([place]) Continue diet changes and increasing activity as tolerated.   4. Left shoulder pain, unspecified chronicity -Follow up Dr. Vickki Hearing in clinic  5. Pain of left calf -Follow up with Dr. Vickki Hearing in clinic - Monitor symptoms. If you develop swelling, redness or heat to the area or shortness of breath please call the clinic or go to the nearest hospital.  6. Health care maintenance - EPINEPHrine 0.3 mg/0.3 mL IJ SOAJ injection; Inject 0.3 mg into the muscle as needed for anaphylaxis.  Dispense: 1 each; Refill: 0 - Ambulatory referral to Hematology / Oncology for mammogram and papsmear.  7. Hearing loss of right ear, unspecified hearing loss type -Monitor hearing loss symptoms and follow up with [facility]NT.  - Ambulatory referral to  ENT   Follow-up: Return in about 2 months (around 03/02/2020), or if symptoms worsen or fail to improve.    Marina Gravel, Student-NP

## 2020-01-06 ENCOUNTER — Other Ambulatory Visit: Payer: Self-pay

## 2020-01-06 ENCOUNTER — Encounter: Payer: Self-pay | Admitting: Family

## 2020-01-06 ENCOUNTER — Ambulatory Visit: Payer: Self-pay | Admitting: Family

## 2020-01-06 ENCOUNTER — Ambulatory Visit
Admission: RE | Admit: 2020-01-06 | Discharge: 2020-01-06 | Disposition: A | Discharge status: Home / Self Care | Payer: Self-pay | Source: Ambulatory Visit | Attending: Family | Admitting: Family

## 2020-01-06 VITALS — BP 164/85 | HR 75 | Resp 20 | Ht 67.0 in | Wt 321.2 lb

## 2020-01-06 DIAGNOSIS — Z6841 Body Mass Index (BMI) 40.0 and over, adult: Secondary | ICD-10-CM | POA: Insufficient documentation

## 2020-01-06 DIAGNOSIS — R1030 Lower abdominal pain, unspecified: Secondary | ICD-10-CM | POA: Insufficient documentation

## 2020-01-06 DIAGNOSIS — I5032 Chronic diastolic (congestive) heart failure: Secondary | ICD-10-CM

## 2020-01-06 DIAGNOSIS — I10 Essential (primary) hypertension: Secondary | ICD-10-CM

## 2020-01-06 DIAGNOSIS — Z7952 Long term (current) use of systemic steroids: Secondary | ICD-10-CM | POA: Insufficient documentation

## 2020-01-06 DIAGNOSIS — I2510 Atherosclerotic heart disease of native coronary artery without angina pectoris: Secondary | ICD-10-CM | POA: Insufficient documentation

## 2020-01-06 DIAGNOSIS — I110 Hypertensive heart disease with heart failure: Secondary | ICD-10-CM | POA: Insufficient documentation

## 2020-01-06 DIAGNOSIS — J45909 Unspecified asthma, uncomplicated: Secondary | ICD-10-CM | POA: Insufficient documentation

## 2020-01-06 DIAGNOSIS — Z79899 Other long term (current) drug therapy: Secondary | ICD-10-CM | POA: Insufficient documentation

## 2020-01-06 DIAGNOSIS — E119 Type 2 diabetes mellitus without complications: Secondary | ICD-10-CM | POA: Insufficient documentation

## 2020-01-06 DIAGNOSIS — I252 Old myocardial infarction: Secondary | ICD-10-CM | POA: Insufficient documentation

## 2020-01-06 DIAGNOSIS — Z7984 Long term (current) use of oral hypoglycemic drugs: Secondary | ICD-10-CM | POA: Insufficient documentation

## 2020-01-06 DIAGNOSIS — E785 Hyperlipidemia, unspecified: Secondary | ICD-10-CM | POA: Insufficient documentation

## 2020-01-06 DIAGNOSIS — R0683 Snoring: Secondary | ICD-10-CM | POA: Insufficient documentation

## 2020-01-06 DIAGNOSIS — Z87891 Personal history of nicotine dependence: Secondary | ICD-10-CM | POA: Insufficient documentation

## 2020-01-06 DIAGNOSIS — E669 Obesity, unspecified: Secondary | ICD-10-CM | POA: Insufficient documentation

## 2020-01-06 DIAGNOSIS — R1031 Right lower quadrant pain: Secondary | ICD-10-CM

## 2020-01-06 LAB — ECHOCARDIOGRAM COMPLETE
AR max vel: 1.79 cm2
AV Area VTI: 1.84 cm2
AV Area mean vel: 1.48 cm2
AV Mean grad: 5.3 mmHg
AV Peak grad: 9.2 mmHg
Ao pk vel: 1.52 m/s
Area-P 1/2: 3.15 cm2
S' Lateral: 2.78 cm

## 2020-01-06 LAB — GLUCOSE, CAPILLARY: Glucose-Capillary: 128 mg/dL — ABNORMAL HIGH (ref 70–99)

## 2020-01-06 NOTE — Progress Notes (Signed)
*  PRELIMINARY RESULTS* Echocardiogram 2D Echocardiogram has been performed.  Kathy [name] 01/06/2020, 10:31 AM

## 2020-01-06 NOTE — Patient Instructions (Addendum)
Take 40mg  of lasix x2 a day for 2 days Take 20mg  of Potassium x2 for 2 days  Follow back up Friday at 2pm   If you have start having worsening pain or are unable to move leg, please seek help immediately at the ER or call 911.    Deep Vein Thrombosis  Deep vein thrombosis (DVT) is a condition in which a blood clot forms in a deep vein, such as a lower leg, thigh, or arm vein. A clot is blood that has thickened into a gel or solid. This condition is dangerous. It can lead to serious and even life-threatening complications if the clot travels to the lungs and causes a blockage (pulmonary embolism). It can also damage veins in the leg. This can result in leg pain, swelling, discoloration, and sores (post-thrombotic syndrome). What are the causes? This condition may be caused by:  A slowdown of blood flow.  Damage to a vein.  A condition that causes blood to clot more easily, such as an inherited clotting disorder. What increases the risk? The following factors may make you more likely to develop this condition:  Being overweight.  Being older, especially over age 76.  Sitting or lying down for more than four hours.  Being in the hospital.  Lack of physical activity (sedentary lifestyle).  Pregnancy, being in childbirth, or having recently given birth.  Taking medicines that contain estrogen, such as medicines to prevent pregnancy.  Smoking.  A history of any of the following: ? Blood clots or a blood clotting disease. ? Peripheral vascular disease. ? Inflammatory bowel disease. ? Cancer. ? Heart disease. ? Genetic conditions that affect how your blood clots, such as Factor V Leiden mutation. ? Neurological diseases that affect your legs (leg paresis). ? A recent injury, such as a car accident. ? Major or lengthy surgery. ? A central line placed inside a large vein. What are the signs or symptoms? Symptoms of this condition include:  Swelling, pain, or tenderness in  an arm or leg.  Warmth, redness, or discoloration in an arm or leg. If the clot is in your leg, symptoms may be more noticeable or worse when you stand or walk. Some people may not develop any symptoms. How is this diagnosed? This condition is diagnosed with:  A medical history and physical exam.  Tests, such as: ? Blood tests. These are done to check how well your blood clots. ? Ultrasound. This is done to check for clots. ? Venogram. For this test, contrast dye is injected into a vein and X-rays are taken to check for any clots. How is this treated? Treatment for this condition depends on:  The cause of your DVT.  Your risk for bleeding or developing more clots.  Any other medical conditions that you have. Treatment may include:  Taking a blood thinner (anticoagulant). This type of medicine prevents clots from forming. It may be taken by mouth, injected under the skin, or injected through an IV (catheter).  Injecting clot-dissolving medicines into the affected vein (catheter-directed thrombolysis).  Having surgery. Surgery may be done to: ? Remove the clot. ? Place a filter in a large vein to catch blood clots before they reach the lungs. Some treatments may be continued for up to six months. Follow these instructions at home: If you are taking blood thinners:  Take the medicine exactly as told by your health care provider. Some blood thinners need to be taken at the same time every day. Do not  skip a dose.  Talk with your health care provider before you take any medicines that contain aspirin or NSAIDs. These medicines increase your risk for dangerous bleeding.  Ask your health care provider about foods and drugs that could change the way the medicine works (may interact). Avoid those things if your health care provider tells you to do so.  Blood thinners can cause easy bruising and may make it difficult to stop bleeding. Because of this: ? Be very careful when using  knives, scissors, or other sharp objects. ? Use an electric razor instead of a blade. ? Avoid activities that could cause injury or bruising, and follow instructions about how to prevent falls.  Wear a medical alert bracelet or carry a card that lists what medicines you take. General instructions  Take over-the-counter and prescription medicines only as told by your health care provider.  Return to your normal activities as told by your health care provider. Ask your health care provider what activities are safe for you.  Wear compression stockings if recommended by your health care provider.  Keep all follow-up visits as told by your health care provider. This is important. How is this prevented? To lower your risk of developing this condition again:  For 30 or more minutes every day, do an activity that: ? Involves moving your arms and legs. ? Increases your heart rate.  When traveling for longer than four hours: ? Exercise your arms and legs every hour. ? Drink plenty of water. ? Avoid drinking alcohol.  Avoid sitting or lying for a long time without moving your legs.  If you have surgery or you are hospitalized, ask about ways to prevent blood clots. These may include taking frequent walks or using anticoagulants.  Stay at a healthy weight.  If you are a woman who is older than age 83, avoid unnecessary use of medicines that contain estrogen, such as some birth control pills.  Do not use any products that contain nicotine or tobacco, such as cigarettes and e-cigarettes. This is especially important if you take estrogen medicines. If you need help quitting, ask your health care provider. Contact a health care provider if:  You miss a dose of your blood thinner.  Your menstrual period is heavier than usual.  You have unusual bruising. Get help right away if:  You have: ? New or increased pain, swelling, or redness in an arm or leg. ? Numbness or tingling in an arm or  leg. ? Shortness of breath. ? Chest pain. ? A rapid or irregular heartbeat. ? A severe headache or confusion. ? A cut that will not stop bleeding.  There is blood in your vomit, stool, or urine.  You have a serious fall or accident, or you hit your head.  You feel light-headed or dizzy.  You cough up blood. These symptoms may represent a serious problem that is an emergency. Do not wait to see if the symptoms will go away. Get medical help right away. Call your local emergency services (911 in the [place] Do not drive yourself to the hospital. Summary  Deep vein thrombosis (DVT) is a condition in which a blood clot forms in a deep vein, such as a lower leg, thigh, or arm vein.  Symptoms can include swelling, warmth, pain, and redness in your leg or arm.  This condition may be treated with a blood thinner (anticoagulant medicine), medicine that is injected to dissolve blood clots,compression stockings, or surgery.  If you are prescribed  blood thinners, take them exactly as told. This information is not intended to replace advice given to you by your health care provider. Make sure you discuss any questions you have with your health care provider. Document Revised: 03/09/2017 Document Reviewed: 08/25/2016 Elsevier Patient Education  [place].

## 2020-01-06 NOTE — Progress Notes (Signed)
Patient ID: Kathy [name], female    DOB: March 15, 1966, 54 y.o.   MRN: [id number]  Kathy [name] is a 54 y/o female with a history of asthma, CAD, hyperlipidemia, HTN, seizures, DM, previous tobacco use and chronic heart failure.   Echo report from 02/27/17 reviewed and showed an EF of 55-60%.  Has not been admitted or been in the ED in the last 6 months.   She presents today for her follow up visit with a chief complaint of minimal shortness of breath upon moderate exertion and right sided groin pain.   Patient reports she is drinking 6-7 bottles of water daily and has noticed swelling in her legs. Patient reports the left legs swells more than the right due to knee surgery. Patient has not yet been able to schedule a sleep study and is planning to attempt to renew her charity care.   Past Medical History:  Diagnosis Date  . Asthma   . CHF (congestive heart failure) ([place])   . Coronary artery disease   . Diabetes mellitus without complication ([place])   . Heart murmur   . Hyperlipidemia   . Hypertension   . Myocardial infarction ([place])   . Obesity   . Seizures ([place])    Past Surgical History:  Procedure Laterality Date  . ANTERIOR CRUCIATE LIGAMENT REPAIR    . BACK SURGERY     L4 and L5  . CARDIAC CATHETERIZATION  04/09/2016  . COLONOSCOPY    . COLONOSCOPY WITH PROPOFOL N/A 08/04/2019   Procedure: COLONOSCOPY WITH PROPOFOL;  Surgeon: Lin Landsman, MD;  Location: [facility] ENDOSCOPY;  Service: Gastroenterology;  Laterality: N/A;  . EMPYEMA DRAINAGE     at age 63  . TONSILLECTOMY    . TONSILLECTOMY AND ADENOIDECTOMY  2008  . TUBAL LIGATION     Family History  Problem Relation Age of Onset  . Kidney disease Sister   . Diabetes Sister   . COPD Sister   . Stroke Sister   . Diabetes Mother   . Stroke Mother   . Heart attack Mother   . Stroke Father   . Heart attack Father   . Asthma Daughter   . Asthma Son    Social History   Tobacco Use  . Smoking status: Former Smoker     Packs/day: 0.20    Years: 20.00    Pack years: 4.00    Types: Cigarettes    Quit date: 08/16/2017    Years since quitting: 2.3  . Smokeless tobacco: Never Used  . Tobacco comment: Decreased smoking to less than 5 cigs/day  Substance Use Topics  . Alcohol use: No   Allergies  Allergen Reactions  . Aspirin Anaphylaxis  . Celebrex [Celecoxib] Anaphylaxis and Hives  . Fentanyl Anaphylaxis  . Latex Hives  . Lisinopril Shortness Of Breath  . Morphine Anaphylaxis  . Percocet [Oxycodone-Acetaminophen] Hives and Shortness Of Breath  . Simvastatin-High Dose Hives and Shortness Of Breath    Can tolerate 20 mg dose.   Jonah Blue [Metaxalone] Anaphylaxis  . Pork-Derived Metallurgist  . Vicodin [Hydrocodone-Acetaminophen] Hives and Other (See Comments)    Feels like her tongue swells  . Albuterol Palpitations   Prior to Admission medications   Medication Sig Start Date End Date Taking? Authorizing Provider  clopidogrel (PLAVIX) 75 MG tablet Take 1 tablet (75 mg total) by mouth daily. 10/30/19  Yes Iloabachie, Chioma E, NP  diphenhydrAMINE (BENADRYL) 50 MG tablet Take 50 mg by mouth 2 (two) times  daily.    Yes [provider]  furosemide (LASIX) 40 MG tablet Take 1 tablet (40 mg total) by mouth daily. 10/30/19  Yes Iloabachie, Chioma E, NP  isosorbide mononitrate (IMDUR) 60 MG 24 hr tablet Take 60 mg in 24 hour 10/30/19  Yes Iloabachie, Chioma E, NP  meclizine (ANTIVERT) 25 MG tablet Take 1 tablet (25 mg total) by mouth 3 (three) times daily as needed for dizziness. 12/16/15  Yes McGowan, Larene Beach A, PA-C  metFORMIN (GLUCOPHAGE) 1000 MG tablet Take 0.5 tablets (500 mg total) by mouth daily with breakfast. 12/04/19  Yes Iloabachie, Chioma E, NP  metoprolol tartrate (LOPRESSOR) 25 MG tablet Take  25 mg by mouth daily 10/30/19  Yes Iloabachie, Chioma E, NP  nitroGLYCERIN (NITROSTAT) 0.4 MG SL tablet 1 TABLET UNDER TONGUE AS NEEDED FOR CHEST PAIN EVERY 5 MINUTES FOR MAX OF 3 DOSES IN 15 MINUTES;  IF NO RELIEF AFTER 1ST DOSE CALL 911 05/13/19  Yes McGowan, Shannon A, PA-C  pantoprazole (PROTONIX) 40 MG tablet Take 1 tablet (40 mg total) by mouth daily. 10/30/19  Yes Iloabachie, Chioma E, NP  potassium chloride SA (KLOR-CON) 20 MEQ tablet Take 1 tablet (20 mEq total) by mouth daily. 10/30/19  Yes Iloabachie, Chioma E, NP  predniSONE (DELTASONE) 10 MG tablet Take 1 tablet (10 mg total) by mouth daily with breakfast. 12/04/19  Yes Iloabachie, Chioma E, NP  simvastatin (ZOCOR) 20 MG tablet Take 1 tablet (20 mg total) by mouth daily. 10/30/19  Yes Iloabachie, Chioma E, NP  ranolazine (RANEXA) 500 MG 12 hr tablet Take 1 tablet (500 mg total) by mouth 2 (two) times daily. Patient not taking: Reported on 12/09/2019 10/31/19   Wellington Hampshire, MD    Review of Systems  Constitutional: Positive for fatigue. Negative for appetite change.  HENT: Negative for congestion, postnasal drip and sore throat.   Eyes: Negative.   Respiratory: Positive for shortness of breath. Negative for apnea and cough.   Cardiovascular: Positive for chest pain (Reports tightness in chest), palpitations and leg swelling (around left ankle).  Gastrointestinal: Negative for abdominal distention and abdominal pain.  Endocrine: Negative.   Genitourinary: Negative.   Musculoskeletal: Negative for back pain and neck pain.  Skin: Negative.   Allergic/Immunologic: Positive for food allergies (Had allergic reaction to pork rinds).  Neurological: Negative for dizziness, syncope ("blacking out sensation") and light-headedness.  Hematological: Negative for adenopathy. Does not bruise/bleed easily.  Psychiatric/Behavioral: Negative for sleep disturbance. The patient is nervous/anxious.     Vitals:   01/06/20 1040  BP: (!) 164/85  Pulse: 75  Resp: 20  SpO2: 97%  Weight: (!) 321 lb 4 oz (145.7 kg)  Height: 5\' 7"  (1.702 m)   Wt Readings from Last 3 Encounters:  01/06/20 (!) 321 lb 4 oz (145.7 kg)  01/01/20 (!) 322 lb 1.6 oz (146.1  kg)  12/11/19 (!) 317 lb (143.8 kg)   Lab Results  Component Value Date   CREATININE 0.61 06/04/2019   CREATININE 0.82 01/22/2019   CREATININE 0.79 09/04/2018     Physical Exam Vitals and nursing note reviewed.  Constitutional:      General: She is not in acute distress.    Appearance: Normal appearance. She is well-developed. She is not ill-appearing, toxic-appearing or diaphoretic.  HENT:     Head: Normocephalic and atraumatic.  Neck:     Vascular: No JVD.  Cardiovascular:     Rate and Rhythm: Normal rate and regular rhythm.  Pulmonary:  Effort: Pulmonary effort is normal. No respiratory distress.     Breath sounds: No wheezing, rhonchi or rales.  Abdominal:     Palpations: Abdomen is soft.     Tenderness: There is no abdominal tenderness.  Musculoskeletal:     Cervical back: Neck supple.     Right lower leg: No tenderness. No edema.     Left lower leg: No tenderness. Edema (+1 pitting edema from ankle up to knee.) present.  Skin:    General: Skin is warm and dry.  Neurological:     General: No focal deficit present.     Mental Status: She is alert and oriented to person, place, and time.  Psychiatric:        Mood and Affect: Mood normal.        Behavior: Behavior normal.     Assessment & Plan:  1: Chronic heart failure with preserved ejection fraction- - NYHA class II - euvolemic today with +1 pitting edema in legs - weighing daily; reminded to call for an overnight weight gain of >2 pounds or a weekly weight gain of >5 pounds - not adding salt but does eat out 1-2 times weekly and is aware that she's getting more salt when she eats out; reviewed the importance of following a low sodium diet  - Drinking 6-7 bottles of water daily. Discussed that this is too much fluid but slightly improved since last visit which she reported to drinking 8-10 bottles of water daily.  - Echo completed today, awaiting reading at this time.  - Patient to take 40mg  of Lasix x2  daily x2 days and Potassium 20mg  x2 daily for 2 days and follow up for re-eval on Friday.   2: HTN- - BP elevated today, reports it is due to pain - saw PCP (Iloabachie) at [facility] 01/01/20 - BMP 06/04/19 reviewed and showed sodium 143, potassium 4.7, creatinine 0.61 and GFR 104  3: DM- -Saw PCP on 01/01/2020 - A1c 11/25/19 was 6.7% - nonfasting glucose in clinic today was 128 -Reports she has decreased her intake of sweets  4: Snoring- -Patient is in the process of scheduling a sleep study with [facility]. Patient reports she has to do the paperwork for charity care.   5. Groin Pain -Patient reports right groin swelling, differential diagnosis includes musculoskeletal strain to groin, hernia, or DVT in groin. Slight redness and swelling noted to area but no bruising or radiating heat. Patient reports that counter pressure to the area provides relief and the pain in less with standing. Patient states she has increased pain with sitting and lying down and describes pain as "sharp and throbbing" -Patient to have Venous ultrasound completed to rule out DVT. Patient reports understanding with plan of care.   Medication bottles reviewed.  Return for recheck on Friday at 2pm.

## 2020-01-07 ENCOUNTER — Ambulatory Visit
Admission: RE | Admit: 2020-01-07 | Discharge: 2020-01-07 | Disposition: A | Discharge status: Home / Self Care | Payer: Self-pay | Source: Ambulatory Visit | Attending: Family | Admitting: Family

## 2020-01-07 DIAGNOSIS — R1031 Right lower quadrant pain: Secondary | ICD-10-CM | POA: Insufficient documentation

## 2020-01-08 ENCOUNTER — Other Ambulatory Visit: Payer: Self-pay | Admitting: Gerontology

## 2020-01-08 NOTE — Progress Notes (Signed)
Patient ID: Kathy [name], female    DOB: 11-26-1965, 54 y.o.   MRN: [id number]  Kathy [name] is a 54 y/o female with a history of asthma, CAD, hyperlipidemia, HTN, seizures, DM, previous tobacco use and chronic heart failure.   Echo report from 01/06/20 reviewed and showed an EF of 55-60% along with mild LVH and mild MR. Echo report from 02/27/17 reviewed and showed an EF of 55-60%.  Has not been admitted or been in the ED in the last 6 months.   She presents today for her follow up visit with a chief complaint of minimal fatigue upon moderate exertion. She describes this as chronic in nature having been present for several years. She has associated left ankle swelling, palpitations, continued right groin pain and anxiety along with this. She denies any dizziness, abdominal distention, chest pain, shortness of breath, cough or weight gain.   Has been taking extra diuretic for last 2 days since she was here last and feels like her breathing and swelling have improved.    Past Medical History:  Diagnosis Date  . Asthma   . CHF (congestive heart failure) ([place])   . Coronary artery disease   . Diabetes mellitus without complication ([place])   . Heart murmur   . Hyperlipidemia   . Hypertension   . Myocardial infarction ([place])   . Obesity   . Seizures ([place])    Past Surgical History:  Procedure Laterality Date  . ANTERIOR CRUCIATE LIGAMENT REPAIR    . BACK SURGERY     L4 and L5  . CARDIAC CATHETERIZATION  04/09/2016  . COLONOSCOPY    . COLONOSCOPY WITH PROPOFOL N/A 08/04/2019   Procedure: COLONOSCOPY WITH PROPOFOL;  Surgeon: Lin Landsman, MD;  Location: [facility] ENDOSCOPY;  Service: Gastroenterology;  Laterality: N/A;  . EMPYEMA DRAINAGE     at age 66  . TONSILLECTOMY    . TONSILLECTOMY AND ADENOIDECTOMY  2008  . TUBAL LIGATION     Family History  Problem Relation Age of Onset  . Kidney disease Sister   . Diabetes Sister   . COPD Sister   . Stroke Sister   . Diabetes Mother   .  Stroke Mother   . Heart attack Mother   . Stroke Father   . Heart attack Father   . Asthma Daughter   . Asthma Son    Social History   Tobacco Use  . Smoking status: Former Smoker    Packs/day: 0.20    Years: 20.00    Pack years: 4.00    Types: Cigarettes    Quit date: 08/16/2017    Years since quitting: 2.3  . Smokeless tobacco: Never Used  . Tobacco comment: Decreased smoking to less than 5 cigs/day  Substance Use Topics  . Alcohol use: No   Allergies  Allergen Reactions  . Aspirin Anaphylaxis  . Celebrex [Celecoxib] Anaphylaxis and Hives  . Fentanyl Anaphylaxis  . Latex Hives  . Lisinopril Shortness Of Breath  . Morphine Anaphylaxis  . Percocet [Oxycodone-Acetaminophen] Hives and Shortness Of Breath  . Simvastatin-High Dose Hives and Shortness Of Breath    Can tolerate 20 mg dose.   Jonah Blue [Metaxalone] Anaphylaxis  . Pork-Derived Metallurgist  . Vicodin [Hydrocodone-Acetaminophen] Hives and Other (See Comments)    Feels like her tongue swells  . Albuterol Palpitations   Prior to Admission medications   Medication Sig Start Date End Date Taking? Authorizing Provider  clopidogrel (PLAVIX) 75 MG tablet Take 1 tablet (  75 mg total) by mouth daily. 10/30/19  Yes Iloabachie, Chioma E, NP  diphenhydrAMINE (BENADRYL) 50 MG tablet Take 50 mg by mouth 2 (two) times daily.    Yes [provider]  EPINEPHrine 0.3 mg/0.3 mL IJ SOAJ injection Inject 0.3 mg into the muscle as needed for anaphylaxis. 01/01/20  Yes Iloabachie, Chioma E, NP  furosemide (LASIX) 40 MG tablet Take 2 tablets (80 mg total) by mouth daily. 01/09/20  Yes Darylene Price A, FNP  isosorbide mononitrate (IMDUR) 60 MG 24 hr tablet Take 60 mg in 24 hour Patient taking differently: 2 (two) times daily. Take 60 mg in 24 hour 10/30/19  Yes Iloabachie, Chioma E, NP  meclizine (ANTIVERT) 25 MG tablet Take 1 tablet (25 mg total) by mouth 3 (three) times daily as needed for dizziness. 12/16/15  Yes McGowan, Larene Beach  A, PA-C  metFORMIN (GLUCOPHAGE) 1000 MG tablet Take 0.5 tablets (500 mg total) by mouth daily with breakfast. 01/01/20  Yes Iloabachie, Chioma E, NP  metoprolol tartrate (LOPRESSOR) 25 MG tablet Take  25 mg by mouth daily Patient taking differently: 2 (two) times daily. Take  25 mg by mouth daily 10/30/19  Yes Iloabachie, Chioma E, NP  nitroGLYCERIN (NITROSTAT) 0.4 MG SL tablet 1 TABLET UNDER TONGUE AS NEEDED FOR CHEST PAIN EVERY 5 MINUTES FOR MAX OF 3 DOSES IN 15 MINUTES; IF NO RELIEF AFTER 1ST DOSE CALL 911 05/13/19  Yes McGowan, Shannon A, PA-C  pantoprazole (PROTONIX) 40 MG tablet Take 1 tablet (40 mg total) by mouth daily. 10/30/19  Yes Iloabachie, Chioma E, NP  potassium chloride SA (KLOR-CON) 20 MEQ tablet Take 2 tablets (40 mEq total) by mouth daily. 01/09/20  Yes Monalisa Bayless, Otila Kluver A, FNP  simvastatin (ZOCOR) 20 MG tablet Take 1 tablet (20 mg total) by mouth daily. 10/30/19  Yes Iloabachie, Chioma E, NP     Review of Systems  Constitutional: Positive for fatigue. Negative for appetite change.  HENT: Negative for congestion, postnasal drip and sore throat.   Eyes: Negative.   Respiratory: Negative for apnea, cough and shortness of breath.   Cardiovascular: Positive for palpitations and leg swelling (around left ankle). Negative for chest pain.  Gastrointestinal: Negative for abdominal distention and abdominal pain.  Endocrine: Negative.   Genitourinary: Negative.   Musculoskeletal: Positive for arthralgias (left shoulder). Negative for back pain and neck pain.  Skin: Negative.   Allergic/Immunologic: Positive for food allergies (Had allergic reaction to pork rinds).  Neurological: Negative for dizziness and light-headedness.  Hematological: Negative for adenopathy. Does not bruise/bleed easily.  Psychiatric/Behavioral: Negative for sleep disturbance. The patient is nervous/anxious.     Vitals:   01/09/20 1015 01/09/20 1035  BP: (!) 141/103 140/90  Pulse: 78   Resp: 18   SpO2: 98%    Weight: (!) 317 lb 4 oz (143.9 kg)   Height: 5\' 7"  (1.702 m)    Wt Readings from Last 3 Encounters:  01/09/20 (!) 317 lb 4 oz (143.9 kg)  01/06/20 (!) 321 lb 4 oz (145.7 kg)  01/01/20 (!) 322 lb 1.6 oz (146.1 kg)   Lab Results  Component Value Date   CREATININE 0.61 06/04/2019   CREATININE 0.82 01/22/2019   CREATININE 0.79 09/04/2018     Physical Exam Vitals and nursing note reviewed.  Constitutional:      General: She is not in acute distress.    Appearance: Normal appearance. She is well-developed. She is not ill-appearing, toxic-appearing or diaphoretic.  HENT:     Head: Normocephalic and  atraumatic.  Neck:     Vascular: No JVD.  Cardiovascular:     Rate and Rhythm: Normal rate and regular rhythm.  Pulmonary:     Effort: Pulmonary effort is normal. No respiratory distress.     Breath sounds: No wheezing, rhonchi or rales.  Abdominal:     Palpations: Abdomen is soft.     Tenderness: There is no abdominal tenderness.  Musculoskeletal:     Cervical back: Neck supple.     Right lower leg: No tenderness. No edema.     Left lower leg: No tenderness. Edema (+1 pitting edema around left ankle) present.  Skin:    General: Skin is warm and dry.  Neurological:     General: No focal deficit present.     Mental Status: She is alert and oriented to person, place, and time.  Psychiatric:        Mood and Affect: Mood normal.        Behavior: Behavior normal.     Assessment & Plan:  1: Chronic heart failure with preserved ejection fraction with structural changes (mild LVH)- - NYHA class II - euvolemic today  - weighing daily; reminded to call for an overnight weight gain of >2 pounds or a weekly weight gain of >5 pounds - weight down 4 pounds since she was last here 2 days ago - not adding salt but does eat out 1-2 times weekly and is aware that she's getting more salt when she eats out; reviewed the importance of following a low sodium diet  - Drinking 6-7 bottles of  water daily; encouraged her to decrease down to ~ 5 bottles  - sees cardiology [facility] Anon) November  - Patient took 40mg  of Lasix x2 daily x2 days and Potassium 61meq x2 daily for 2 days; will continue furosemide 80mg  daily/ potassium 63meq daily - check BMP today - had shortness of breath with lisinopril; may not be a good candidate for entresto  2: HTN- - BP initially elevated but had improved upon recheck with manual cuff - saw PCP (Iloabachie) at [facility] 01/01/20 & returns 01/20/20 - BMP 06/04/19 reviewed and showed sodium 143, potassium 4.7, creatinine 0.61 and GFR 104  3: DM- -Saw PCP on 01/01/2020 - A1c 11/25/19 was 6.7% - nonfasting glucose in clinic today was 155 - does not have a glucometer at home  4: Snoring- -Patient is in the process of scheduling a sleep study with [facility].   5. Groin Pain -recent ultrasound was negative for DVT - to f/u with PCP for further work-up    Patient did not bring her medications nor a list. Each medication was verbally reviewed with the patient and she was encouraged to bring the bottles to every visit to confirm accuracy of list.  Return in 3 months or sooner for any questions/problems before then.

## 2020-01-09 ENCOUNTER — Ambulatory Visit: Payer: Self-pay | Attending: Family | Admitting: Family

## 2020-01-09 ENCOUNTER — Other Ambulatory Visit: Payer: Self-pay | Admitting: Family

## 2020-01-09 ENCOUNTER — Encounter: Payer: Self-pay | Admitting: Family

## 2020-01-09 ENCOUNTER — Other Ambulatory Visit: Payer: Self-pay

## 2020-01-09 VITALS — BP 140/90 | HR 78 | Resp 18 | Ht 67.0 in | Wt 317.2 lb

## 2020-01-09 DIAGNOSIS — I5032 Chronic diastolic (congestive) heart failure: Secondary | ICD-10-CM | POA: Insufficient documentation

## 2020-01-09 DIAGNOSIS — Z888 Allergy status to other drugs, medicaments and biological substances status: Secondary | ICD-10-CM | POA: Insufficient documentation

## 2020-01-09 DIAGNOSIS — Z79899 Other long term (current) drug therapy: Secondary | ICD-10-CM | POA: Insufficient documentation

## 2020-01-09 DIAGNOSIS — R0683 Snoring: Secondary | ICD-10-CM | POA: Insufficient documentation

## 2020-01-09 DIAGNOSIS — R1031 Right lower quadrant pain: Secondary | ICD-10-CM

## 2020-01-09 DIAGNOSIS — I2510 Atherosclerotic heart disease of native coronary artery without angina pectoris: Secondary | ICD-10-CM | POA: Insufficient documentation

## 2020-01-09 DIAGNOSIS — Z6841 Body Mass Index (BMI) 40.0 and over, adult: Secondary | ICD-10-CM | POA: Insufficient documentation

## 2020-01-09 DIAGNOSIS — I252 Old myocardial infarction: Secondary | ICD-10-CM | POA: Insufficient documentation

## 2020-01-09 DIAGNOSIS — I110 Hypertensive heart disease with heart failure: Secondary | ICD-10-CM | POA: Insufficient documentation

## 2020-01-09 DIAGNOSIS — Z87891 Personal history of nicotine dependence: Secondary | ICD-10-CM | POA: Insufficient documentation

## 2020-01-09 DIAGNOSIS — Z8249 Family history of ischemic heart disease and other diseases of the circulatory system: Secondary | ICD-10-CM | POA: Insufficient documentation

## 2020-01-09 DIAGNOSIS — Z7902 Long term (current) use of antithrombotics/antiplatelets: Secondary | ICD-10-CM | POA: Insufficient documentation

## 2020-01-09 DIAGNOSIS — Z886 Allergy status to analgesic agent status: Secondary | ICD-10-CM | POA: Insufficient documentation

## 2020-01-09 DIAGNOSIS — E669 Obesity, unspecified: Secondary | ICD-10-CM | POA: Insufficient documentation

## 2020-01-09 DIAGNOSIS — E785 Hyperlipidemia, unspecified: Secondary | ICD-10-CM | POA: Insufficient documentation

## 2020-01-09 DIAGNOSIS — J45909 Unspecified asthma, uncomplicated: Secondary | ICD-10-CM | POA: Insufficient documentation

## 2020-01-09 DIAGNOSIS — Z7984 Long term (current) use of oral hypoglycemic drugs: Secondary | ICD-10-CM | POA: Insufficient documentation

## 2020-01-09 DIAGNOSIS — E119 Type 2 diabetes mellitus without complications: Secondary | ICD-10-CM | POA: Insufficient documentation

## 2020-01-09 DIAGNOSIS — Z885 Allergy status to narcotic agent status: Secondary | ICD-10-CM | POA: Insufficient documentation

## 2020-01-09 DIAGNOSIS — I10 Essential (primary) hypertension: Secondary | ICD-10-CM

## 2020-01-09 LAB — BASIC METABOLIC PANEL
Anion gap: 12 (ref 5–15)
BUN: 10 mg/dL (ref 6–20)
CO2: 24 mmol/L (ref 22–32)
Calcium: 9 mg/dL (ref 8.9–10.3)
Chloride: 99 mmol/L (ref 98–111)
Creatinine, Ser: 0.77 mg/dL (ref 0.44–1.00)
GFR calc Af Amer: >60 mL/min (ref >60)
GFR calc non Af Amer: >60 mL/min (ref >60)
Glucose, Bld: 141 mg/dL — ABNORMAL HIGH (ref 70–99)
Potassium: 3.7 mmol/L (ref 3.5–5.1)
Sodium: 135 mmol/L (ref 135–145)

## 2020-01-09 LAB — GLUCOSE, CAPILLARY: Glucose-Capillary: 155 mg/dL — ABNORMAL HIGH (ref 70–99)

## 2020-01-09 MED ORDER — FUROSEMIDE 40 MG PO TABS: 80.0000 mg | ORAL_TABLET | Freq: Every day | ORAL | 3 refills | Status: DC

## 2020-01-09 MED ORDER — POTASSIUM CHLORIDE CRYS ER 20 MEQ PO TBCR: 40.0000 meq | EXTENDED_RELEASE_TABLET | Freq: Every day | ORAL | 3 refills | Status: DC

## 2020-01-09 NOTE — Patient Instructions (Signed)
Continue weighing daily and call for an overnight weight gain of > 2 pounds or a weekly weight gain of >5 pounds. 

## 2020-01-20 ENCOUNTER — Encounter: Payer: Self-pay | Admitting: Gerontology

## 2020-01-20 ENCOUNTER — Other Ambulatory Visit: Payer: Self-pay

## 2020-01-21 NOTE — Progress Notes (Signed)
This encounter was created in error - please disregard.

## 2020-02-02 ENCOUNTER — Other Ambulatory Visit: Payer: Self-pay | Admitting: Gerontology

## 2020-02-02 DIAGNOSIS — Z8679 Personal history of other diseases of the circulatory system: Secondary | ICD-10-CM

## 2020-02-02 DIAGNOSIS — I10 Essential (primary) hypertension: Secondary | ICD-10-CM

## 2020-02-02 DIAGNOSIS — I152 Hypertension secondary to endocrine disorders: Secondary | ICD-10-CM

## 2020-02-02 DIAGNOSIS — E1159 Type 2 diabetes mellitus with other circulatory complications: Secondary | ICD-10-CM

## 2020-02-04 ENCOUNTER — Other Ambulatory Visit: Payer: Self-pay | Admitting: Gerontology

## 2020-02-04 DIAGNOSIS — E1159 Type 2 diabetes mellitus with other circulatory complications: Secondary | ICD-10-CM

## 2020-02-04 DIAGNOSIS — I10 Essential (primary) hypertension: Secondary | ICD-10-CM

## 2020-02-04 DIAGNOSIS — K219 Gastro-esophageal reflux disease without esophagitis: Secondary | ICD-10-CM

## 2020-02-04 DIAGNOSIS — Z8679 Personal history of other diseases of the circulatory system: Secondary | ICD-10-CM

## 2020-02-04 DIAGNOSIS — I152 Hypertension secondary to endocrine disorders: Secondary | ICD-10-CM

## 2020-02-04 MED ORDER — ISOSORBIDE MONONITRATE ER 60 MG PO TB24: ORAL_TABLET | ORAL | 0 refills | Status: DC

## 2020-02-04 MED ORDER — METOPROLOL TARTRATE 25 MG PO TABS: 25.0000 mg | ORAL_TABLET | Freq: Two times a day (BID) | ORAL | 3 refills | Status: DC

## 2020-02-04 MED ORDER — PANTOPRAZOLE SODIUM 40 MG PO TBEC: 40.0000 mg | DELAYED_RELEASE_TABLET | Freq: Every day | ORAL | 0 refills | Status: DC

## 2020-02-04 MED ORDER — CLOPIDOGREL BISULFATE 75 MG PO TABS: 75.0000 mg | ORAL_TABLET | Freq: Every day | ORAL | 0 refills | Status: DC

## 2020-02-19 ENCOUNTER — Telehealth: Payer: Self-pay

## 2020-02-19 NOTE — Telephone Encounter (Signed)
Called patient and asked her if she would like to be screened for the [facility] program and gave her a little information about the program.  Patient wants to be contacted by [facility] for more information only at this time and did not want to commit to being screened.

## 2020-02-24 ENCOUNTER — Other Ambulatory Visit: Payer: Self-pay

## 2020-02-24 ENCOUNTER — Ambulatory Visit: Payer: Self-pay

## 2020-02-24 ENCOUNTER — Other Ambulatory Visit: Payer: Self-pay | Admitting: Gerontology

## 2020-02-24 VITALS — BP 126/84 | HR 84 | Wt 318.0 lb

## 2020-02-24 DIAGNOSIS — Z79899 Other long term (current) drug therapy: Secondary | ICD-10-CM

## 2020-02-24 DIAGNOSIS — Z0000 Encounter for general adult medical examination without abnormal findings: Secondary | ICD-10-CM

## 2020-02-24 NOTE — Progress Notes (Signed)
Medication Management Clinic Visit Note  Patient: Kathy [name] MRN: [id number] Date of Birth: 1965/06/11 PCP: Langston Reusing, NP   [name] 54 y.o. female presents for a medication therapy management visit today.  LMP 06/09/2018   Patient Information   Past Medical History:  Diagnosis Date  . Asthma   . CHF (congestive heart failure) ([place])   . Coronary artery disease   . Diabetes mellitus without complication ([place])   . Heart murmur   . Hyperlipidemia   . Hypertension   . Myocardial infarction ([place])   . Obesity   . Seizures ([place])       Past Surgical History:  Procedure Laterality Date  . ANTERIOR CRUCIATE LIGAMENT REPAIR    . BACK SURGERY     L4 and L5  . CARDIAC CATHETERIZATION  04/09/2016  . COLONOSCOPY    . COLONOSCOPY WITH PROPOFOL N/A 08/04/2019   Procedure: COLONOSCOPY WITH PROPOFOL;  Surgeon: Lin Landsman, MD;  Location: [facility] ENDOSCOPY;  Service: Gastroenterology;  Laterality: N/A;  . EMPYEMA DRAINAGE     at age 16  . TONSILLECTOMY    . TONSILLECTOMY AND ADENOIDECTOMY  2008  . TUBAL LIGATION       Family History  Problem Relation Age of Onset  . Kidney disease Sister   . Diabetes Sister   . COPD Sister   . Stroke Sister   . Diabetes Mother   . Stroke Mother   . Heart attack Mother   . Stroke Father   . Heart attack Father   . Asthma Daughter   . Asthma Son     New Diagnoses (since last visit): None  Family Support: Good  Lifestyle Diet: Breakfast: None Lunch: Noodles or popcorn, chips Dinner: chicken and rice or spaghetti or tacos or [place] rice Drinks: water    Current Exercise Habits: The patient has a physically strenuous job, but has no regular exercise apart from work. 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 Packs/day: 0.20  . Years: 20.00  . Pack years: 4.00  . Types: Cigarettes  . Quit date: 08/16/2017  . Years since quitting: 2.5  Smokeless Tobacco Never Used  Tobacco Comment   Decreased smoking to less than 5 cigs/day      Health Maintenance  Topic Date Due  . Hepatitis C Screening  Never done  . OPHTHALMOLOGY EXAM  Never done  . COVID-19 Vaccine (1) Never done  . HIV Screening  Never done  . TETANUS/TDAP  Never done  . PAP SMEAR-Modifier  Never done  . MAMMOGRAM  Never done  . INFLUENZA VACCINE  11/09/2019  . URINE MICROALBUMIN  01/22/2020  . HEMOGLOBIN A1C  05/27/2020  . FOOT EXAM  06/11/2020  . COLONOSCOPY  08/04/2026  . PNEUMOCOCCAL POLYSACCHARIDE VACCINE AGE 38-64 HIGH RISK  Completed   . Outpatient Encounter Medications as of 02/24/2020  Medication Sig  . clopidogrel (PLAVIX) 75 MG tablet Take 1 tablet (75 mg total) by mouth daily.  . diphenhydrAMINE (BENADRYL) 50 MG tablet Take 50 mg by mouth 2 (two) times daily.   . furosemide (LASIX) 40 MG tablet Take 2 tablets (80 mg total) by mouth daily.  . isosorbide mononitrate (IMDUR) 60 MG 24 hr tablet Take 60 mg  in 24 hour (Patient taking differently: 60 mg in the morning and at bedtime. )  . meclizine (ANTIVERT) 25 MG tablet Take 1 tablet (25 mg total) by mouth 3 (three) times daily as needed for dizziness.  . metFORMIN (GLUCOPHAGE) 1000 MG tablet Take 0.5 tablets (500 mg total) by mouth daily with breakfast.  . metoprolol tartrate (LOPRESSOR) 25 MG tablet Take 1 tablet (25 mg total) by mouth 2 (two) times daily. Take  25 mg by mouth daily  . nitroGLYCERIN (NITROSTAT) 0.4 MG SL tablet 1 TABLET UNDER TONGUE AS NEEDED FOR CHEST PAIN EVERY 5 MINUTES FOR MAX OF 3 DOSES IN 15 MINUTES; IF NO RELIEF AFTER 1ST DOSE CALL 911  . pantoprazole (PROTONIX) 40 MG tablet Take 1 tablet (40 mg total) by mouth daily.  . potassium chloride SA (KLOR-CON) 20 MEQ tablet Take 2 tablets (40 mEq total) by mouth daily. (Patient taking  differently: Take 20 mEq by mouth daily. )  . simvastatin (ZOCOR) 20 MG tablet Take 1 tablet (20 mg total) by mouth daily.  Marland Kitchen EPINEPHrine 0.3 mg/0.3 mL IJ SOAJ injection Inject 0.3 mg into the muscle as needed for anaphylaxis.   No facility-administered encounter medications on file as of 02/24/2020.   Health Maintenance/Date Completed  Last ED visit: 10/21/2016 Last Visit to PCP: 02/04/20 Next Visit to PCP: 03/02/20 Dental Exam: 2018-2019 Eye Exam: Unknown maybe 2017  Pelvic/PAP Exam: Unknown Mammogram: 2015 Colonoscopy: 2021 Flu Vaccine: 2020 Pneumonia Vaccine: 2019 or 2020 COVID-19 Vaccine: pt refused  Assessment and Plan:  Access/Adherence: Pt does not report issues with access to housing, food, or transportation. Pt does not report issues with missed medications.  Diabetes: Pt takes metformin 500 mg daily. A1c 11/25/19 6.7%.  Plan: Continue metformin daily.   Coronary artery disease/History of myocardial infarction/Hypertension: Pt takes simvastatin 20 mg daily, furosemide 80 mg daily, potassium chloride 20 mEq daily, metoprolol tartrate 25 mg twice daily,  isosorbide mononitrate 60 mg twice daily, and has nitroglycerin 0.4 mg sublingual tablets on hand for chest pain.  Plan: Updated patients nitroglycerin as hers was from 2019. Recommended replacing every 6 months to 1 year. Continue taking simvastatin, furosemide, potassium chloride, metoprolol and isosorbide mononitrate.  Allergic reactions: Pt is currently waiting for in process patient application for an EpiPen.   Dizziness: Pt takes meclizine 25 mg three times daily as needed. Plan: Continue meclizine as needed.   GERD: Pt takes pantoprazole 40 mg daily. Plan: Continue pantoprazole.   Follow up in 1 year for Outreach MTM  Benn Moulder, PharmD Pharmacy Resident  02/24/2020 2:50 PM

## 2020-02-25 ENCOUNTER — Other Ambulatory Visit: Payer: Self-pay

## 2020-02-25 ENCOUNTER — Telehealth: Payer: Self-pay | Admitting: Pharmacist

## 2020-02-25 DIAGNOSIS — E119 Type 2 diabetes mellitus without complications: Secondary | ICD-10-CM

## 2020-02-25 DIAGNOSIS — I10 Essential (primary) hypertension: Secondary | ICD-10-CM

## 2020-02-25 NOTE — Telephone Encounter (Signed)
02/25/2020 9:33:38 AM - [employer] application faxed for EpiPen  -- Elmer Picker - Wednesday, February 25, 2020 9:31 AM - Faxed [employer] application for Epipen 0.3mg  (2 pak) #1 requested to ship to our office.

## 2020-02-26 LAB — BASIC METABOLIC PANEL
BUN/Creatinine Ratio: 14 (ref 9–23)
BUN: 10 mg/dL (ref 6–24)
CO2: 23 mmol/L (ref 20–29)
Calcium: 9.3 mg/dL (ref 8.7–10.2)
Chloride: 104 mmol/L (ref 96–106)
Creatinine, Ser: 0.73 mg/dL (ref 0.57–1.00)
GFR calc Af Amer: 109 mL/min/{1.73_m2} (ref >59)
GFR calc non Af Amer: 94 mL/min/{1.73_m2} (ref >59)
Glucose: 138 mg/dL — ABNORMAL HIGH (ref 65–99)
Potassium: 4.2 mmol/L (ref 3.5–5.2)
Sodium: 142 mmol/L (ref 134–144)

## 2020-02-26 LAB — MICROALBUMIN / CREATININE URINE RATIO
Creatinine, Urine: 296.4 mg/dL
Microalb/Creat Ratio: 3 mg/g creat (ref 0–29)
Microalbumin, Urine: 9.6 ug/mL

## 2020-02-26 LAB — HEMOGLOBIN A1C
Est. average glucose Bld gHb Est-mCnc: 151 mg/dL
Hgb A1c MFr Bld: 6.9 % — ABNORMAL HIGH (ref 4.8–5.6)

## 2020-03-02 ENCOUNTER — Ambulatory Visit: Payer: Self-pay | Admitting: Gerontology

## 2020-03-02 ENCOUNTER — Encounter: Payer: Self-pay | Admitting: Gerontology

## 2020-03-02 ENCOUNTER — Other Ambulatory Visit: Payer: Self-pay

## 2020-03-02 ENCOUNTER — Ambulatory Visit (INDEPENDENT_AMBULATORY_CARE_PROVIDER_SITE_OTHER): Payer: Self-pay | Admitting: Cardiovascular Disease

## 2020-03-02 ENCOUNTER — Ambulatory Visit: Payer: Self-pay | Admitting: Specialist

## 2020-03-02 ENCOUNTER — Encounter: Payer: Self-pay | Admitting: Cardiovascular Disease

## 2020-03-02 VITALS — BP 116/82 | HR 75 | Resp 16 | Wt 320.2 lb

## 2020-03-02 VITALS — BP 118/68 | HR 83 | Ht 67.0 in | Wt 320.0 lb

## 2020-03-02 DIAGNOSIS — Z8679 Personal history of other diseases of the circulatory system: Secondary | ICD-10-CM

## 2020-03-02 DIAGNOSIS — M25512 Pain in left shoulder: Secondary | ICD-10-CM

## 2020-03-02 DIAGNOSIS — R600 Localized edema: Secondary | ICD-10-CM

## 2020-03-02 DIAGNOSIS — E785 Hyperlipidemia, unspecified: Secondary | ICD-10-CM

## 2020-03-02 DIAGNOSIS — E119 Type 2 diabetes mellitus without complications: Secondary | ICD-10-CM

## 2020-03-02 DIAGNOSIS — R011 Cardiac murmur, unspecified: Secondary | ICD-10-CM

## 2020-03-02 DIAGNOSIS — I25118 Atherosclerotic heart disease of native coronary artery with other forms of angina pectoris: Secondary | ICD-10-CM

## 2020-03-02 DIAGNOSIS — E782 Mixed hyperlipidemia: Secondary | ICD-10-CM

## 2020-03-02 MED ORDER — CLOPIDOGREL BISULFATE 75 MG PO TABS: 75.0000 mg | ORAL_TABLET | Freq: Every day | ORAL | 0 refills | Status: DC

## 2020-03-02 MED ORDER — BLOOD GLUCOSE MONITOR KIT: PACK | 0 refills | Status: AC

## 2020-03-02 MED ORDER — METFORMIN HCL 1000 MG PO TABS: 500.0000 mg | ORAL_TABLET | Freq: Two times a day (BID) | ORAL | 2 refills | Status: DC

## 2020-03-02 MED ORDER — SIMVASTATIN 20 MG PO TABS
20.0000 mg | ORAL_TABLET | Freq: Every day | ORAL | 0 refills | Status: DC
  Filled 2020-09-10: qty 4, 4d supply, fill #0

## 2020-03-02 NOTE — Progress Notes (Signed)
HPI A year long hx of left shulder pain. Hx of multiple fractures. clavical as a child. She is not able to sleep on left side. She could not fasten her bra from the back. She has trouldble reaching over shoulder level.  Recent hx of left calf pain radiaditon from the lateral boarder of her foot. No hx of trauma.  Assessment left shoulder most probably RTC The calf pain I am not as certain doesn't appear to be coming from her back. I will try her on a course of Meloxocam. She will return in one month. Physical Assessment She has a normal gate. She declines to toe walk because it cause pain. She has tandem gate but has balance issues. She was able to heal walk. She declines to do a squat because she will be unable to rise.   MMT about the ankle 5/5 DTR-S 2+=bil.  On inspection there is no deformity. There is no TTP. Seated SLR is neg. Sens. Is in tact.    Right shoulder: ROM.  Left shoulder 130 degrees FF.  Ext.30 degrees. Add. 0 Abd: 110.  Ir:9. Er:20 MMT. She is weak in ER and Abd.   Plan Xrays on left shoulders. Start Meloxocam 7.5 mg qd. Follow up on 12/14.

## 2020-03-02 NOTE — Patient Instructions (Signed)
Carbohydrate Counting for Diabetes Mellitus, Adult  Carbohydrate counting is a method of keeping track of how many carbohydrates you eat. Eating carbohydrates naturally increases the amount of sugar (glucose) in the blood. Counting how many carbohydrates you eat helps keep your blood glucose within normal limits, which helps you manage your diabetes (diabetes mellitus). It is important to know how many carbohydrates you can safely have in each meal. This is different for every person. A diet and nutrition specialist (registered dietitian) can help you make a meal plan and calculate how many carbohydrates you should have at each meal and snack. Carbohydrates are found in the following foods:  Grains, such as breads and cereals.  Dried beans and soy products.  Starchy vegetables, such as potatoes, peas, and corn.  Fruit and fruit juices.  Milk and yogurt.  Sweets and snack foods, such as cake, cookies, candy, chips, and soft drinks. How do I count carbohydrates? There are two ways to count carbohydrates in food. You can use either of the methods or a combination of both. Reading "Nutrition Facts" on packaged food The "Nutrition Facts" list is included on the labels of almost all packaged foods and beverages in the [place] It includes:  The serving size.  Information about nutrients in each serving, including the grams (g) of carbohydrate per serving. To use the "Nutrition Facts":  Decide how many servings you will have.  Multiply the number of servings by the number of carbohydrates per serving.  The resulting number is the total amount of carbohydrates that you will be having. Learning standard serving sizes of other foods When you eat carbohydrate foods that are not packaged or do not include "Nutrition Facts" on the label, you need to measure the servings in order to count the amount of carbohydrates:  Measure the foods that you will eat with a food scale or measuring cup, if  needed.  Decide how many standard-size servings you will eat.  Multiply the number of servings by 15. Most carbohydrate-rich foods have about 15 g of carbohydrates per serving. ? For example, if you eat 8 oz (170 g) of strawberries, you will have eaten 2 servings and 30 g of carbohydrates (2 servings x 15 g = 30 g).  For foods that have more than one food mixed, such as soups and casseroles, you must count the carbohydrates in each food that is included. The following list contains standard serving sizes of common carbohydrate-rich foods. Each of these servings has about 15 g of carbohydrates:   hamburger bun or  English muffin.   oz (15 mL) syrup.   oz (14 g) jelly.  1 slice of bread.  1 six-inch tortilla.  3 oz (85 g) cooked rice or pasta.  4 oz (113 g) cooked dried beans.  4 oz (113 g) starchy vegetable, such as peas, corn, or potatoes.  4 oz (113 g) hot cereal.  4 oz (113 g) mashed potatoes or  of a large baked potato.  4 oz (113 g) canned or frozen fruit.  4 oz (120 mL) fruit juice.  4-6 crackers.  6 chicken nuggets.  6 oz (170 g) unsweetened dry cereal.  6 oz (170 g) plain fat-free yogurt or yogurt sweetened with artificial sweeteners.  8 oz (240 mL) milk.  8 oz (170 g) fresh fruit or one small piece of fruit.  24 oz (680 g) popped popcorn. Example of carbohydrate counting Sample meal  3 oz (85 g) chicken breast.  6 oz (170 g)   brown rice.  4 oz (113 g) corn.  8 oz (240 mL) milk.  8 oz (170 g) strawberries with sugar-free whipped topping. Carbohydrate calculation 1. Identify the foods that contain carbohydrates: ? Rice. ? Corn. ? Milk. ? Strawberries. 2. Calculate how many servings you have of each food: ? 2 servings rice. ? 1 serving corn. ? 1 serving milk. ? 1 serving strawberries. 3. Multiply each number of servings by 15 g: ? 2 servings rice x 15 g = 30 g. ? 1 serving corn x 15 g = 15 g. ? 1 serving milk x 15 g = 15 g. ? 1  serving strawberries x 15 g = 15 g. 4. Add together all of the amounts to find the total grams of carbohydrates eaten: ? 30 g + 15 g + 15 g + 15 g = 75 g of carbohydrates total. Summary  Carbohydrate counting is a method of keeping track of how many carbohydrates you eat.  Eating carbohydrates naturally increases the amount of sugar (glucose) in the blood.  Counting how many carbohydrates you eat helps keep your blood glucose within normal limits, which helps you manage your diabetes.  A diet and nutrition specialist (registered dietitian) can help you make a meal plan and calculate how many carbohydrates you should have at each meal and snack. This information is not intended to replace advice given to you by your health care provider. Make sure you discuss any questions you have with your health care provider. Document Revised: 10/19/2016 Document Reviewed: 09/08/2015 Elsevier Patient Education  2020 [employer] Inc.  

## 2020-03-02 NOTE — Progress Notes (Signed)
Established Patient Office Visit  Subjective:  Patient ID: Kathy [name], female    DOB: [birth year]  Age: 54 y.o. MRN: [id number]  CC: No chief complaint on file.   HPI Kathy [name] presents for follow up of type 2 diabetes, lab review and medication refill. Kathy [name] HgbA1c done on 02/25/2020 was 6.9%, increased from 6.7%. Kathy [name] denies hypo/hyperglycemia and peripheral neuropathy. Kathy [name] states that Kathy [name] is compliant with Kathy [name] medications and continues to make healthy lifestyle changes. Kathy [name] blood glucose was 118 mg/dl when checked during Kathy [name] visit. Kathy [name] c/o bilateral leg swelling though Kathy [name] takes 80 mg Furosemide. Kathy [name] denies shortness of breath, chest pain and palpitation. Kathy [name] was seen by [facility] Orthopedic Surgeon Dr Vickki Hearing for Kathy [name] chronic left shoulder pain and X ray was recommended. Overall, Kathy [name] states that Kathy [name]'s doing well and offers no further complaint.  Past Medical History:  Diagnosis Date  . Asthma   . CHF (congestive heart failure) ([place])   . Coronary artery disease   . Diabetes mellitus without complication ([place])   . Heart murmur   . Hyperlipidemia   . Hypertension   . Myocardial infarction ([place])   . Obesity   . Seizures ([place])     Past Surgical History:  Procedure Laterality Date  . ANTERIOR CRUCIATE LIGAMENT REPAIR    . BACK SURGERY     L4 and L5  . CARDIAC CATHETERIZATION  04/09/2016  . COLONOSCOPY    . COLONOSCOPY WITH PROPOFOL N/A 08/04/2019   Procedure: COLONOSCOPY WITH PROPOFOL;  Surgeon: Lin Landsman, MD;  Location: [facility] ENDOSCOPY;  Service: Gastroenterology;  Laterality: N/A;  . EMPYEMA DRAINAGE     at age 49  . TONSILLECTOMY    . TONSILLECTOMY AND ADENOIDECTOMY  2008  . TUBAL LIGATION      Family History  Problem Relation Age of Onset  . Kidney disease Sister   . Diabetes Sister   . COPD Sister   . Stroke Sister   . Diabetes Mother   . Stroke Mother   . Heart attack Mother   . Stroke Father   . Heart attack Father   . Asthma Daughter   . Asthma Son      Social History   Socioeconomic History  . Marital status: Divorced    Spouse name: Not on file  . Number of children: 2  . Years of education: pre-requisites   . Highest education level: GED or equivalent  Occupational History  . Occupation: unemployed  Tobacco Use  . Smoking status: Former Smoker    Packs/day: 0.20    Years: 20.00    Pack years: 4.00    Types: Cigarettes    Quit date: 08/16/2017    Years since quitting: 2.5  . Smokeless tobacco: Never Used  . Tobacco comment: Decreased smoking to less than 5 cigs/day  Vaping Use  . Vaping Use: Never used  Substance and Sexual Activity  . Alcohol use: No  . Drug use: No  . Sexual activity: Yes    Birth control/protection: Condom  Other Topics Concern  . Not on file  Social History Narrative   Currently on food stamps. They help somewhat but still difficult to buy food that Kathy [name] needs. Boyfriend helps pay rent with disability, they live together. Roof is leaking really badly. Lives in modular home. Social services bought tarp which helped, but it deteriorated from heat.    Social Determinants of Health   Financial Resource Strain:   . Difficulty of Paying Living Expenses:  Not on file  Food Insecurity:   . Worried About Charity fundraiser in the Last Year: Not on file  . Ran Out of Food in the Last Year: Not on file  Transportation Needs:   . Lack of Transportation (Medical): Not on file  . Lack of Transportation (Non-Medical): Not on file  Physical Activity:   . Days of Exercise per Week: Not on file  . Minutes of Exercise per Session: Not on file  Stress:   . Feeling of Stress : Not on file  Social Connections:   . Frequency of Communication with Friends and Family: Not on file  . Frequency of Social Gatherings with Friends and Family: Not on file  . Attends Religious Services: Not on file  . Active Member of Clubs or Organizations: Not on file  . Attends Archivist Meetings: Not on file  . Marital  Status: Not on file  Intimate Partner Violence:   . Fear of Current or Ex-Partner: Not on file  . Emotionally Abused: Not on file  . Physically Abused: Not on file  . Sexually Abused: Not on file    Outpatient Medications Prior to Visit  Medication Sig Dispense Refill  . diphenhydrAMINE (BENADRYL) 50 MG tablet Take 50 mg by mouth 2 (two) times daily.     . isosorbide mononitrate (IMDUR) 60 MG 24 hr tablet Take 60 mg in 24 hour (Patient taking differently: 60 mg in the morning and at bedtime. ) 120 tablet 0  . meclizine (ANTIVERT) 25 MG tablet Take 1 tablet (25 mg total) by mouth 3 (three) times daily as needed for dizziness. 90 tablet 6  . metoprolol tartrate (LOPRESSOR) 25 MG tablet Take 1 tablet (25 mg total) by mouth 2 (two) times daily. Take  25 mg by mouth daily 60 tablet 3  . nitroGLYCERIN (NITROSTAT) 0.4 MG SL tablet 1 TABLET UNDER TONGUE AS NEEDED FOR CHEST PAIN EVERY 5 MINUTES FOR MAX OF 3 DOSES IN 15 MINUTES; IF NO RELIEF AFTER 1ST DOSE CALL 911 25 tablet 0  . pantoprazole (PROTONIX) 40 MG tablet Take 1 tablet (40 mg total) by mouth daily. 90 tablet 0  . potassium chloride SA (KLOR-CON) 20 MEQ tablet Take 2 tablets (40 mEq total) by mouth daily. (Patient taking differently: Take 20 mEq by mouth daily. ) 180 tablet 3  . clopidogrel (PLAVIX) 75 MG tablet Take 1 tablet (75 mg total) by mouth daily. 90 tablet 0  . furosemide (LASIX) 40 MG tablet Take 2 tablets (80 mg total) by mouth daily. 180 tablet 3  . metFORMIN (GLUCOPHAGE) 1000 MG tablet Take 0.5 tablets (500 mg total) by mouth daily with breakfast. 30 tablet 3  . simvastatin (ZOCOR) 20 MG tablet Take 1 tablet (20 mg total) by mouth daily. 90 tablet 0  . EPINEPHrine 0.3 mg/0.3 mL IJ SOAJ injection Inject 0.3 mg into the muscle as needed for anaphylaxis. 1 each 0   No facility-administered medications prior to visit.    Allergies  Allergen Reactions  . Aspirin Anaphylaxis  . Celebrex [Celecoxib] Anaphylaxis and Hives  .  Fentanyl Anaphylaxis  . Latex Hives  . Lisinopril Shortness Of Breath  . Morphine Anaphylaxis  . Percocet [Oxycodone-Acetaminophen] Hives and Shortness Of Breath  . Simvastatin-High Dose Hives and Shortness Of Breath    Can tolerate 20 mg dose.   Jonah Blue [Metaxalone] Anaphylaxis  . Pork-Derived Metallurgist  . Vicodin [Hydrocodone-Acetaminophen] Hives and Other (See Comments)    Feels like Kathy [name]  tongue swells  . Albuterol Palpitations    ROS Review of Systems  Constitutional: Negative.   Eyes: Negative.   Respiratory: Negative.   Cardiovascular: Positive for leg swelling.  Endocrine: Negative.   Musculoskeletal: Positive for arthralgias (left shoulder pain).  Neurological: Negative.   Hematological: Negative.   Psychiatric/Behavioral: Negative.       Objective:    Physical Exam HENT:     Head: Normocephalic.  Cardiovascular:     Rate and Rhythm: Normal rate and regular rhythm.     Pulses: Normal pulses.     Heart sounds: Normal heart sounds.  Pulmonary:     Effort: Pulmonary effort is normal.     Breath sounds: Normal breath sounds.  Musculoskeletal:        General: Swelling (trace edema to bilateral lower extremity) present.  Skin:    General: Skin is warm and dry.  Neurological:     General: No focal deficit present.     Mental Status: Kathy [name] is alert and oriented to person, place, and time. Mental status is at baseline.  Psychiatric:        Mood and Affect: Mood normal.        Behavior: Behavior normal.        Thought Content: Thought content normal.        Judgment: Judgment normal.     BP 116/82 (BP Location: Left Arm, Patient Position: Sitting, Cuff Size: Large)   Pulse 75   Resp 16   Wt (!) 320 lb 3.2 oz (145.2 kg)   LMP 06/09/2018   SpO2 96%   BMI 50.15 kg/m  Wt Readings from Last 3 Encounters:  03/02/20 (!) 320 lb (145.2 kg)  03/02/20 (!) 320 lb 3.2 oz (145.2 kg)  02/24/20 (!) 318 lb (144.2 kg)   Kathy [name] was strongly encouraged to loose  weight  Health Maintenance Due  Topic Date Due  . Hepatitis C Screening  Never done  . OPHTHALMOLOGY EXAM  Never done  . COVID-19 Vaccine (1) Never done  . HIV Screening  Never done  . TETANUS/TDAP  Never done  . PAP SMEAR-Modifier  Never done  . MAMMOGRAM  Never done    Kathy [name] states that Kathy [name] will schedule Kathy [name] Mammogram and Pap smear at the beginning of the year.  Lab Results  Component Value Date   TSH 1.770 10/04/2017   Lab Results  Component Value Date   WBC 6.2 09/04/2018   HGB 13.8 09/04/2018   HCT 42.1 09/04/2018   MCV 86 09/04/2018   PLT 253 09/04/2018   Lab Results  Component Value Date   NA 142 02/25/2020   K 4.2 02/25/2020   CO2 23 02/25/2020   GLUCOSE 138 (H) 02/25/2020   BUN 10 02/25/2020   CREATININE 0.73 02/25/2020   BILITOT 0.3 01/22/2019   ALKPHOS 95 01/22/2019   AST 21 01/22/2019   ALT 24 01/22/2019   PROT 6.8 01/22/2019   ALBUMIN 3.4 (L) 01/22/2019   CALCIUM 9.3 02/25/2020   ANIONGAP 12 01/09/2020   Lab Results  Component Value Date   CHOL 149 01/22/2019   Lab Results  Component Value Date   HDL 27 (L) 01/22/2019   Lab Results  Component Value Date   LDLCALC 93 01/22/2019   Lab Results  Component Value Date   TRIG 167 (H) 01/22/2019   Lab Results  Component Value Date   CHOLHDL 5.5 (H) 01/22/2019   Lab Results  Component Value Date   HGBA1C 6.9 (H) 02/25/2020  Assessment & Plan:    1. History of CHF (congestive heart failure) -Kathy [name] will continue Kathy [name] current treatment regimen. Kathy [name] was advised to go to the ED for hematuria, hematochezia or active bleeding. - clopidogrel (PLAVIX) 75 MG tablet; Take 1 tablet (75 mg total) by mouth daily.  Dispense: 90 tablet; Refill: 0  2. Type 2 diabetes mellitus without complication, without long-term current use of insulin (HCC) - Kathy [name] HgbA1c was 6.9%, Kathy [name] Metformin was increased to 500 mg bid, was advised to check blood glucose at least daily and Kathy [name] fasting reading should be between  80-130 mg/dl. Kathy [name] was advised to continue on low carb/non concentrated sweet diet and loose weight. - metFORMIN (GLUCOPHAGE) 1000 MG tablet; Take 0.5 tablets (500 mg total) by mouth 2 (two) times daily with a meal.  Dispense: 60 tablet; Refill: 2 - blood glucose meter kit and supplies KIT; Dispense based on patient and insurance preference. Use up to four times daily as directed. (FOR ICD-9 250.00, 250.01).  Dispense: 1 each; Refill: 0 - HgB A1c; Future  3. Mixed hyperlipidemia -Kathy [name] will continue Kathy [name] current treatment regimen, advised to continue on low fat/cholesterol diet and exercise as tolerated. - simvastatin (ZOCOR) 20 MG tablet; Take 1 tablet (20 mg total) by mouth daily.  Dispense: 90 tablet; Refill: 0  4. Left shoulder pain, unspecified chronicity - Kathy [name] was advised to schedule left shoulder X Ray and follow up with Dr Vickki Hearing. - DG Shoulder Left; Future  5. Edema of both lower extremities - Kathy [name] was advised to continue on 80 mg Furosemide, weigh self daily and notify CHF clinic . Kathy [name] was advised to elevate legs while siting down and wear compression stockings. Kathy [name] is to notify CHF clinic for worsening edema to bilateral lower extremities.    Follow-up: Return in about 3 months (around 06/02/2020), or if symptoms worsen or fail to improve.    Nikolina Simerson Jerold Coombe, NP

## 2020-03-02 NOTE — Progress Notes (Signed)
Cardiology Office Note   Date:  03/02/2020   ID:  Kathy [name], DOB [birth year], MRN [id number]  PCP:  Langston Reusing, NP  Cardiologist:   [name], MD   Chief Complaint  Patient presents with  . Follow-up    6 months  Pt c/o swelling in both legs and SOB on exertion. She states chest pain is not as often, but still happens occasionally when she is moving around cleaning her house.      History of Present Illness: Kathy [name] is a 54 y.o. female who is here today for follow-up visit regarding chronic chest pain.    The patient has known history of recurrent chest pain usually responsive to nitroglycerin which is thought to be due to coronary spasm or endothelial dysfunction.    She had cardiac catheterization done 4 times in the past in 2007, 2011, 2012 and most recently in December 2017 . All these showed mild nonobstructive coronary artery disease.  She is known to have rate dependent left bundle branch block.  She is allergic to aspirin and thus on long-term Plavix.  Other medical problems include previous tobacco use, obesity, chronic back pain and hyperlipidemia.  She has family history of coronary artery disease. We tried her on Ranexa last year with significant improvement in symptoms.  However, she could not continue the medication due to cost.   Her chest pain is overall improved although she continues to be limited by significant exertional dyspnea.  In addition, she continues to gain weight.  That seems to be the biggest problem for her.  She gets more leg edema after she eats out.  She is currently taking furosemide 80 mg once daily.  Echocardiogram in September of this year showed an EF of 55 to 60%, mild left ventricular hypertrophy, grade 2 diastolic dysfunction and mild mitral regurgitation.  Past Medical History:  Diagnosis Date  . Asthma   . CHF (congestive heart failure) ([place])   . Coronary artery disease   . Diabetes mellitus without  complication ([place])   . Heart murmur   . Hyperlipidemia   . Hypertension   . Myocardial infarction ([place])   . Obesity   . Seizures ([place])     Past Surgical History:  Procedure Laterality Date  . ANTERIOR CRUCIATE LIGAMENT REPAIR    . BACK SURGERY     L4 and L5  . CARDIAC CATHETERIZATION  04/09/2016  . COLONOSCOPY    . COLONOSCOPY WITH PROPOFOL N/A 08/04/2019   Procedure: COLONOSCOPY WITH PROPOFOL;  Surgeon: Lin Landsman, MD;  Location: [facility] ENDOSCOPY;  Service: Gastroenterology;  Laterality: N/A;  . EMPYEMA DRAINAGE     at age 6  . TONSILLECTOMY    . TONSILLECTOMY AND ADENOIDECTOMY  2008  . TUBAL LIGATION       Current Outpatient Medications  Medication Sig Dispense Refill  . blood glucose meter kit and supplies KIT Dispense based on patient and insurance preference. Use up to four times daily as directed. (FOR ICD-9 250.00, 250.01). 1 each 0  . clopidogrel (PLAVIX) 75 MG tablet Take 1 tablet (75 mg total) by mouth daily. 90 tablet 0  . diphenhydrAMINE (BENADRYL) 50 MG tablet Take 50 mg by mouth 2 (two) times daily.     Marland Kitchen EPINEPHrine 0.3 mg/0.3 mL IJ SOAJ injection Inject 0.3 mg into the muscle as needed for anaphylaxis. 1 each 0  . furosemide (LASIX) 80 MG tablet Take 80 mg by mouth daily.    Marland Kitchen  isosorbide mononitrate (IMDUR) 60 MG 24 hr tablet Take 60 mg in 24 hour (Patient taking differently: 60 mg in the morning and at bedtime. ) 120 tablet 0  . meclizine (ANTIVERT) 25 MG tablet Take 1 tablet (25 mg total) by mouth 3 (three) times daily as needed for dizziness. 90 tablet 6  . metFORMIN (GLUCOPHAGE) 1000 MG tablet Take 0.5 tablets (500 mg total) by mouth 2 (two) times daily with a meal. 60 tablet 2  . metoprolol tartrate (LOPRESSOR) 25 MG tablet Take 1 tablet (25 mg total) by mouth 2 (two) times daily. Take  25 mg by mouth daily 60 tablet 3  . nitroGLYCERIN (NITROSTAT) 0.4 MG SL tablet 1 TABLET UNDER TONGUE AS NEEDED FOR CHEST PAIN EVERY 5 MINUTES FOR MAX OF 3 DOSES IN 15  MINUTES; IF NO RELIEF AFTER 1ST DOSE CALL 911 25 tablet 0  . pantoprazole (PROTONIX) 40 MG tablet Take 1 tablet (40 mg total) by mouth daily. 90 tablet 0  . potassium chloride SA (KLOR-CON) 20 MEQ tablet Take 2 tablets (40 mEq total) by mouth daily. (Patient taking differently: Take 20 mEq by mouth daily. ) 180 tablet 3  . simvastatin (ZOCOR) 20 MG tablet Take 1 tablet (20 mg total) by mouth daily. 90 tablet 0   No current facility-administered medications for this visit.    Allergies:   Aspirin, Celebrex [celecoxib], Fentanyl, Latex, Lisinopril, Morphine, Percocet [oxycodone-acetaminophen], Simvastatin-high dose, Skelaxin [metaxalone], Pork-derived products, Vicodin [hydrocodone-acetaminophen], and Albuterol    Social History:  The patient  reports that she quit smoking about 2 years ago. Her smoking use included cigarettes. She has a 4.00 pack-year smoking history. She has never used smokeless tobacco. She reports that she does not drink alcohol and does not use drugs.   Family History:  The patient's family history includes Asthma in her daughter and son; COPD in her sister; Diabetes in her mother and sister; Heart attack in her father and mother; Kidney disease in her sister; Stroke in her father, mother, and sister.    ROS:  Please see the history of present illness.   Otherwise, review of systems are positive for none.   All other systems are reviewed and negative.    PHYSICAL EXAM: VS:  BP 118/68   Pulse 83   Ht _0  (1.702 m)   Wt (!) 320 lb (145.2 kg)   LMP 06/09/2018   BMI 50.12 kg/m  , BMI Body mass index is 50.12 kg/m. GEN: Well nourished, well developed, in no acute distress  HEENT: normal  Neck: no JVD, carotid bruits, or masses Cardiac: RRR; no  rubs, or gallops .  1/ 6 systolic murmur in the aortic area.  Mild bilateral leg edema Respiratory:  clear to auscultation bilaterally, normal work of breathing GI: soft, nontender, nondistended, + BS MS: no deformity or  atrophy  Skin: warm and dry, no rash Neuro:  Strength and sensation are intact Psych: euthymic mood, full affect   EKG:  EKG is ordered today. The ekg ordered today demonstrates normal sinus rhythm with left bundle branch block.   Recent Labs: 02/25/2020: BUN 10; Creatinine, Ser 0.73; Potassium 4.2; Sodium 142    Lipid Panel    Component Value Date/Time   CHOL 149 01/22/2019 1216   TRIG 167 (H) 01/22/2019 1216   HDL 27 (L) 01/22/2019 1216   CHOLHDL 5.5 (H) 01/22/2019 1216   CHOLHDL 5.7 02/24/2016 0618   VLDL 30 02/24/2016 0618   LDLCALC 93 01/22/2019 1216  Wt Readings from Last 3 Encounters:  03/02/20 (!) 320 lb (145.2 kg)  03/02/20 (!) 320 lb 3.2 oz (145.2 kg)  02/24/20 (!) 318 lb (144.2 kg)       PAD Screen 02/23/2017  Previous PAD dx? No  Previous surgical procedure? No  Pain with walking? Yes  Subsides with rest? No  Feet/toe relief with dangling? No  Painful, non-healing ulcers? No  Extremities discolored? No      ASSESSMENT AND PLAN:  1.  Mild coronary artery disease with other forms of angina:  She has chronic angina thought to be due to coronary spasm or endothelial dysfunction.  Significant improvement in symptoms with Ranexa but she could not continue the medication due to cost.  Continue antianginal therapy with Imdur and metoprolol.  We checked the price of Ranexa with good Rx and it was $25 per month.  The patient is not able to afford that.  2.  Cardiac murmur: Seems to be physiologic with no significant change since last visit.  Echocardiogram showed no significant valvular abnormalities.  3.  Hyperlipidemia: Continue treatment with simvastatin.  Most recent LDL was 93.  4.  Previous tobacco use: No relapse.  5.  Morbid obesity: This seems to be the biggest issue for him.  She has not been able to lose any weight and if anything, her weight is gradually increasing.  She is interested in surgical weight loss but does not have health  insurance.   Disposition:   FU with me in 6 months  Signed,  [name], MD  03/02/2020 4:15 PM    [facility]

## 2020-03-02 NOTE — Patient Instructions (Signed)
Medication Instructions:  Your physician recommends that you continue on your current medications as directed. Please refer to the Current Medication list given to you today.  *If you need a refill on your cardiac medications before your next appointment, please call your pharmacy*   Lab Work: None ordered If you have labs (blood work) drawn today and your tests are completely normal, you will receive your results only by: Marland Kitchen MyChart Message (if you have MyChart) OR . A paper copy in the mail If you have any lab test that is abnormal or we need to change your treatment, we will call you to review the results.   Testing/Procedures: None ordered   Follow-Up: At [facility], you and your health needs are our priority.  As part of our continuing mission to provide you with exceptional heart care, we have created designated Provider Care Teams.  These Care Teams include your primary Cardiologist (physician) and Advanced Practice Providers (APPs -  Physician Assistants and Nurse Practitioners) who all work together to provide you with the care you need, when you need it.  We recommend signing up for the patient portal called "MyChart".  Sign up information is provided on this After Visit Summary.  MyChart is used to connect with patients for Virtual Visits (Telemedicine).  Patients are able to view lab/test results, encounter notes, upcoming appointments, etc.  Non-urgent messages can be sent to your provider as well.   To learn more about what you can do with MyChart, go to [email or web address].    Your next appointment:   6 month(s)  The format for your next appointment:   In Person  Provider:   You may see  Dr. Fletcher Anon or one of the following Advanced Practice Providers on your designated Care Team:    Murray Hodgkins, NP  Christell Faith, PA-C  Marrianne Mood, PA-C  Cadence Painter, [place]  Laurann Montana, NP    Other Instructions N/A

## 2020-03-09 ENCOUNTER — Ambulatory Visit
Admission: RE | Admit: 2020-03-09 | Discharge: 2020-03-09 | Disposition: A | Discharge status: Home / Self Care | Payer: Self-pay | Attending: Gerontology | Admitting: Gerontology

## 2020-03-09 ENCOUNTER — Ambulatory Visit
Admission: RE | Admit: 2020-03-09 | Discharge: 2020-03-09 | Disposition: A | Discharge status: Home / Self Care | Payer: Self-pay | Source: Ambulatory Visit | Attending: Gerontology | Admitting: Gerontology

## 2020-03-09 ENCOUNTER — Other Ambulatory Visit: Payer: Self-pay

## 2020-03-09 DIAGNOSIS — M25512 Pain in left shoulder: Secondary | ICD-10-CM

## 2020-03-23 ENCOUNTER — Other Ambulatory Visit: Payer: Self-pay

## 2020-03-23 ENCOUNTER — Ambulatory Visit: Payer: Self-pay | Admitting: Specialist

## 2020-03-23 DIAGNOSIS — M25512 Pain in left shoulder: Secondary | ICD-10-CM

## 2020-03-23 NOTE — Progress Notes (Unsigned)
HPI The shoulder is worst. She now is unable to drive. She has difficulty turning the steering wheel.    Physical Assessment  ROM: FF 90 degrees EXT 30 ADD 0 ABD 45 IR/ER 90/90 MMT: 4/5. I am still concerned about the rotator cuff.we will send her for MRI of Left Shoulder   Plan:  We will send her for MRI of Left Shoulder. Return once MRI is complete.

## 2020-04-05 ENCOUNTER — Other Ambulatory Visit: Payer: Self-pay

## 2020-04-06 ENCOUNTER — Ambulatory Visit: Payer: Self-pay | Admitting: Family

## 2020-04-06 NOTE — Progress Notes (Signed)
Patient ID: Kathy [name], female    DOB: 01/23/1966, 54 y.o.   MRN: [id number]  Kathy [name] is a 54 y/o female with a history of asthma, CAD, hyperlipidemia, HTN, seizures, DM, previous tobacco use and chronic heart failure.   Echo report from 01/06/20 reviewed and showed an EF of 55-60% along with mild LVH and mild MR. Echo report from 02/27/17 reviewed and showed an EF of 55-60%.  Has not been admitted or been in the ED in the last 6 months.   She presents today for her follow up visit with a chief complaint of moderate shortness of breath with moderate exertion. Did walk to the office today from the [place] entrance and reports being winded upon arrival. She has associated fatigue, chest heaviness, pedal edema (worsening), palpitations, light-headedness, difficulty sleeping and continued weight gain along with this. She denies any abdominal distention, chest pain or cough.   Says that she's been on furosemide for numerous years. She did the cooking for Christmas and doesn't cook with salt although she did notice that her creamed potatoes tasted salty to her (used sour cream, little bit of cheese and unsalted butter).   Voices concern over her inability to obtain her epi-pen through [facility].She says that she hasn't had one in ~ 5 years and she's had terrible reactions to bee stings, foods etc so it scares her that she can't get one.   She's also interested in bariatric surgery as she recognizes that many of her symptoms could be related to her weight. She applied once for medicaid and was denied.     Past Medical History:  Diagnosis Date  . Asthma   . CHF (congestive heart failure) ([place])   . Coronary artery disease   . Diabetes mellitus without complication ([place])   . Heart murmur   . Hyperlipidemia   . Hypertension   . Myocardial infarction ([place])   . Obesity   . Seizures ([place])    Past Surgical History:  Procedure Laterality Date  . ANTERIOR CRUCIATE LIGAMENT  REPAIR    . BACK SURGERY     L4 and L5  . CARDIAC CATHETERIZATION  04/09/2016  . COLONOSCOPY    . COLONOSCOPY WITH PROPOFOL N/A 08/04/2019   Procedure: COLONOSCOPY WITH PROPOFOL;  Surgeon: Lin Landsman, MD;  Location: [facility] ENDOSCOPY;  Service: Gastroenterology;  Laterality: N/A;  . EMPYEMA DRAINAGE     at age 3  . TONSILLECTOMY    . TONSILLECTOMY AND ADENOIDECTOMY  2008  . TUBAL LIGATION     Family History  Problem Relation Age of Onset  . Kidney disease Sister   . Diabetes Sister   . COPD Sister   . Stroke Sister   . Diabetes Mother   . Stroke Mother   . Heart attack Mother   . Stroke Father   . Heart attack Father   . Asthma Daughter   . Asthma Son    Social History   Tobacco Use  . Smoking status: Former Smoker    Packs/day: 0.20    Years: 20.00    Pack years: 4.00    Types: Cigarettes    Quit date: 08/16/2017    Years since quitting: 2.6  . Smokeless tobacco: Never Used  . Tobacco comment: Decreased smoking to less than 5 cigs/day  Substance Use Topics  . Alcohol use: No   Allergies  Allergen Reactions  . Aspirin Anaphylaxis  . Celebrex [Celecoxib] Anaphylaxis and Hives  . Fentanyl Anaphylaxis  .  Latex Hives  . Lisinopril Shortness Of Breath  . Morphine Anaphylaxis  . Percocet [Oxycodone-Acetaminophen] Hives and Shortness Of Breath  . Simvastatin-High Dose Hives and Shortness Of Breath    Can tolerate 20 mg dose.   Jonah Blue [Metaxalone] Anaphylaxis  . Pork-Derived Metallurgist  . Vicodin [Hydrocodone-Acetaminophen] Hives and Other (See Comments)    Feels like her tongue swells  . Albuterol Palpitations   Prior to Admission medications   Medication Sig Start Date End Date Taking? Authorizing Provider  blood glucose meter kit and supplies KIT Dispense based on patient and insurance preference. Use up to four times daily as directed. (FOR ICD-9 250.00, 250.01). 03/02/20  Yes Iloabachie, Chioma E, NP  clopidogrel (PLAVIX) 75 MG tablet Take 1  tablet (75 mg total) by mouth daily. 03/02/20  Yes Iloabachie, Chioma E, NP  diphenhydrAMINE (BENADRYL) 50 MG tablet Take 50 mg by mouth 2 (two) times daily.   Yes [provider]  EPINEPHrine 0.3 mg/0.3 mL IJ SOAJ injection Inject 0.3 mg into the muscle as needed for anaphylaxis. 01/01/20  Yes Iloabachie, Chioma E, NP  furosemide (LASIX) 80 MG tablet Take 80 mg by mouth daily.   Yes [provider]  isosorbide mononitrate (IMDUR) 60 MG 24 hr tablet Take 60 mg in 24 hour Patient taking differently: 60 mg in the morning and at bedtime. 02/04/20  Yes Iloabachie, Chioma E, NP  meclizine (ANTIVERT) 25 MG tablet Take 1 tablet (25 mg total) by mouth 3 (three) times daily as needed for dizziness. 12/16/15  Yes McGowan, Larene Beach A, PA-C  metFORMIN (GLUCOPHAGE) 1000 MG tablet Take 0.5 tablets (500 mg total) by mouth 2 (two) times daily with a meal. 03/02/20  Yes Iloabachie, Chioma E, NP  metoprolol tartrate (LOPRESSOR) 25 MG tablet Take 1 tablet (25 mg total) by mouth 2 (two) times daily. Take  25 mg by mouth daily 02/04/20  Yes Iloabachie, Chioma E, NP  nitroGLYCERIN (NITROSTAT) 0.4 MG SL tablet 1 TABLET UNDER TONGUE AS NEEDED FOR CHEST PAIN EVERY 5 MINUTES FOR MAX OF 3 DOSES IN 15 MINUTES; IF NO RELIEF AFTER 1ST DOSE CALL 911 05/13/19  Yes McGowan, Shannon A, PA-C  pantoprazole (PROTONIX) 40 MG tablet Take 1 tablet (40 mg total) by mouth daily. 02/04/20  Yes Iloabachie, Chioma E, NP  potassium chloride SA (KLOR-CON) 20 MEQ tablet Take 2 tablets (40 mEq total) by mouth daily. Patient taking differently: Take 20 mEq by mouth daily. 01/09/20  Yes [place] Rico, Otila Kluver A, FNP  simvastatin (ZOCOR) 20 MG tablet Take 1 tablet (20 mg total) by mouth daily. 03/02/20  Yes Iloabachie, Chioma E, NP    Review of Systems  Constitutional: Positive for fatigue. Negative for appetite change.  HENT: Negative for congestion, postnasal drip and sore throat.   Eyes: Negative.   Respiratory: Positive for chest tightness  ("heaviness at times") and shortness of breath. Negative for apnea and cough.   Cardiovascular: Positive for palpitations and leg swelling (both lower legs). Negative for chest pain.  Gastrointestinal: Negative for abdominal distention and abdominal pain.  Endocrine: Negative.   Genitourinary: Negative.   Musculoskeletal: Positive for arthralgias (left shoulder). Negative for back pain and neck pain.  Skin: Negative.   Allergic/Immunologic: Positive for food allergies (Had allergic reaction to pork rinds).  Neurological: Positive for light-headedness ("sometimes"). Negative for dizziness.  Hematological: Negative for adenopathy. Does not bruise/bleed easily.  Psychiatric/Behavioral: Positive for sleep disturbance (sleeping on 1 pillow). Negative for dysphoric mood. The patient is not nervous/anxious.  Vitals:   04/07/20 1016  BP: (!) 142/73  Pulse: 80  Resp: 18  SpO2: 97%  Weight: (!) 324 lb 4 oz (147.1 kg)  Height: 5' 7" (1.702 m)   Wt Readings from Last 3 Encounters:  04/07/20 (!) 324 lb 4 oz (147.1 kg)  03/02/20 (!) 320 lb (145.2 kg)  03/02/20 (!) 320 lb 3.2 oz (145.2 kg)   Lab Results  Component Value Date   CREATININE 0.73 02/25/2020   CREATININE 0.77 01/09/2020   CREATININE 0.61 06/04/2019    Physical Exam Vitals and nursing note reviewed.  Constitutional:      General: She is not in acute distress.    Appearance: Normal appearance. She is well-developed. She is not ill-appearing, toxic-appearing or diaphoretic.  HENT:     Head: Normocephalic and atraumatic.  Neck:     Vascular: No JVD.  Cardiovascular:     Rate and Rhythm: Normal rate and regular rhythm.  Pulmonary:     Effort: Pulmonary effort is normal. No respiratory distress.     Breath sounds: No wheezing, rhonchi or rales.  Abdominal:     Palpations: Abdomen is soft.     Tenderness: There is no abdominal tenderness.  Musculoskeletal:     Cervical back: Neck supple.     Right lower leg: No tenderness.  Edema (1+ pitting lower leg) present.     Left lower leg: No tenderness. Edema (1+ pitting lower leg) present.  Skin:    General: Skin is warm and dry.     Comments: Pealing skin on right palm  Neurological:     General: No focal deficit present.     Mental Status: She is alert and oriented to person, place, and time.  Psychiatric:        Mood and Affect: Mood normal.        Behavior: Behavior normal.     Assessment & Plan:  1: Chronic heart failure with preserved ejection fraction with structural changes (mild LVH)- - NYHA class II - euvolemic today  - weighing daily; reminded to call for an overnight weight gain of >2 pounds or a weekly weight gain of >5 pounds - weight up 7 pounds since she was last here 3 months ago - not adding salt but does eat out 1-2 times weekly and is aware that she's getting more salt when she eats out; reviewed the importance of following a low sodium diet  - Drinking 6-7 bottles of water daily - saw cardiology Fletcher Anon) 03/02/20 - will change her diuretic to torsemide 61m daily; advised patient to continue her furosemide until [facility] gets her the torsemide; then she can stop the furosemide and begin the torsemide - will check BMP at next visit - had shortness of breath with lisinopril; may not be a good candidate for entresto  2: HTN- - BP mildly elevated today; changing diuretic per above - [facility] - BMP 02/25/20 reviewed and showed sodium 142, potassium 4.2, creatinine 0.73 and GFR 94  3: DM- - A1c 02/25/20 was 6.9% - glucose at home today was 132  4: Obesity- - will send secure message [facility] - encouraged patient to re-apply for medicaid   Sent secure chat to PCP and [facility] about patient's need for epi-pen.    Patient did not bring her medications nor a list. Each medication  was  verbally reviewed with the patient and she was encouraged to bring the bottles to every visit to confirm accuracy of list.  Return in 6 weeks or sooner for any questions/problems before then.

## 2020-04-07 ENCOUNTER — Ambulatory Visit: Payer: Self-pay | Attending: Family | Admitting: Family

## 2020-04-07 ENCOUNTER — Other Ambulatory Visit: Payer: Self-pay

## 2020-04-07 ENCOUNTER — Encounter: Payer: Self-pay | Admitting: Family

## 2020-04-07 ENCOUNTER — Other Ambulatory Visit: Payer: Self-pay | Admitting: Family

## 2020-04-07 VITALS — BP 142/73 | HR 80 | Resp 18 | Ht 67.0 in | Wt 324.2 lb

## 2020-04-07 DIAGNOSIS — J45909 Unspecified asthma, uncomplicated: Secondary | ICD-10-CM | POA: Insufficient documentation

## 2020-04-07 DIAGNOSIS — I110 Hypertensive heart disease with heart failure: Secondary | ICD-10-CM | POA: Insufficient documentation

## 2020-04-07 DIAGNOSIS — Z888 Allergy status to other drugs, medicaments and biological substances status: Secondary | ICD-10-CM | POA: Insufficient documentation

## 2020-04-07 DIAGNOSIS — I2510 Atherosclerotic heart disease of native coronary artery without angina pectoris: Secondary | ICD-10-CM | POA: Insufficient documentation

## 2020-04-07 DIAGNOSIS — Z7902 Long term (current) use of antithrombotics/antiplatelets: Secondary | ICD-10-CM | POA: Insufficient documentation

## 2020-04-07 DIAGNOSIS — Z6841 Body Mass Index (BMI) 40.0 and over, adult: Secondary | ICD-10-CM | POA: Insufficient documentation

## 2020-04-07 DIAGNOSIS — I10 Essential (primary) hypertension: Secondary | ICD-10-CM

## 2020-04-07 DIAGNOSIS — Z79899 Other long term (current) drug therapy: Secondary | ICD-10-CM | POA: Insufficient documentation

## 2020-04-07 DIAGNOSIS — E669 Obesity, unspecified: Secondary | ICD-10-CM | POA: Insufficient documentation

## 2020-04-07 DIAGNOSIS — E119 Type 2 diabetes mellitus without complications: Secondary | ICD-10-CM | POA: Insufficient documentation

## 2020-04-07 DIAGNOSIS — I252 Old myocardial infarction: Secondary | ICD-10-CM | POA: Insufficient documentation

## 2020-04-07 DIAGNOSIS — E785 Hyperlipidemia, unspecified: Secondary | ICD-10-CM | POA: Insufficient documentation

## 2020-04-07 DIAGNOSIS — Z8249 Family history of ischemic heart disease and other diseases of the circulatory system: Secondary | ICD-10-CM | POA: Insufficient documentation

## 2020-04-07 DIAGNOSIS — Z7984 Long term (current) use of oral hypoglycemic drugs: Secondary | ICD-10-CM | POA: Insufficient documentation

## 2020-04-07 DIAGNOSIS — Z886 Allergy status to analgesic agent status: Secondary | ICD-10-CM | POA: Insufficient documentation

## 2020-04-07 DIAGNOSIS — Z885 Allergy status to narcotic agent status: Secondary | ICD-10-CM | POA: Insufficient documentation

## 2020-04-07 DIAGNOSIS — Z87891 Personal history of nicotine dependence: Secondary | ICD-10-CM | POA: Insufficient documentation

## 2020-04-07 DIAGNOSIS — I5032 Chronic diastolic (congestive) heart failure: Secondary | ICD-10-CM | POA: Insufficient documentation

## 2020-04-07 DIAGNOSIS — R569 Unspecified convulsions: Secondary | ICD-10-CM | POA: Insufficient documentation

## 2020-04-07 MED ORDER — TORSEMIDE 20 MG PO TABS: 60.0000 mg | ORAL_TABLET | Freq: Every day | ORAL | 3 refills | Status: DC

## 2020-04-07 NOTE — Patient Instructions (Addendum)
Continue weighing daily and call for an overnight weight gain of > 2 pounds or a weekly weight gain of >5 pounds.   Continue furosemide until [facility] calls you with the torsemide. When you pick up the torsemide, you will stop taking furosemide and begin taking torsemide 3 tablets once daily (60mg  total)

## 2020-05-04 ENCOUNTER — Other Ambulatory Visit: Payer: Self-pay | Admitting: Gerontology

## 2020-05-04 DIAGNOSIS — K219 Gastro-esophageal reflux disease without esophagitis: Secondary | ICD-10-CM

## 2020-05-04 DIAGNOSIS — E1159 Type 2 diabetes mellitus with other circulatory complications: Secondary | ICD-10-CM

## 2020-05-05 ENCOUNTER — Other Ambulatory Visit: Payer: Self-pay | Admitting: Gerontology

## 2020-05-05 DIAGNOSIS — Z8679 Personal history of other diseases of the circulatory system: Secondary | ICD-10-CM

## 2020-05-13 ENCOUNTER — Other Ambulatory Visit: Payer: Self-pay | Admitting: Gerontology

## 2020-05-17 NOTE — Progress Notes (Signed)
Patient ID: Kathy [name], female    DOB: [birth year], 55 y.o.   MRN: [id number]  Ms Kathy [name] is a 55 y/o female with a history of asthma, CAD, hyperlipidemia, HTN, seizures, DM, previous tobacco use and chronic heart failure.   Echo report from 01/06/20 reviewed and showed an EF of 55-60% along with mild LVH and mild MR. Echo report from 02/27/17 reviewed and showed an EF of 55-60%.  Has not been admitted or been in the ED in the last 6 months.   She presents today for her follow up visit with a chief complaint of minimal shortness of breath upon moderate exertion. She describes this as chronic in nature. She has associated fatigue, pedal edema (improving), light-headedness, difficulty sleeping, chronic pain and rash along with this. She denies any abdominal distention, palpitations, chest pain, cough or weight gain.   Has been taking 57m torsemide daily and says that she feels like this diuretic is working better for her. However, about 1 week after starting medication she developed a rash/ swelling around the right eye with some red streaks present along with a rash over her lower abdomen and palms of her hands. All of these symptoms have been slowly improving although the abdominal rash is still present. She's not sure if it was the torsemide or not.   Voices concern over her inability to obtain her epi-pen through [facility].She says that she hasn't had one in ~ 5 years and she's had terrible reactions to bee stings, foods etc so it scares her that she can't get one. She says that she's filled out another application since she was last here but still hasn't gotten one.    She's also interested in bariatric surgery as she recognizes that many of her symptoms could be related to her weight. She applied once for medicaid and was denied.     Past Medical History:  Diagnosis Date  . Asthma   . CHF (congestive heart failure) ([place]   . Coronary artery disease   . Diabetes mellitus  without complication ([place]   . Heart murmur   . Hyperlipidemia   . Hypertension   . [place]ocardial infarction ([place]   . Obesity   . Seizures ([place]    Past Surgical History:  Procedure Laterality Date  . ANTERIOR CRUCIATE LIGAMENT REPAIR    . BACK SURGERY     L4 and L5  . CARDIAC CATHETERIZATION  04/09/2016  . COLONOSCOPY    . COLONOSCOPY WITH PROPOFOL N/A 08/04/2019   Procedure: COLONOSCOPY WITH PROPOFOL;  Surgeon: VLin Landsman MD;  [facility];  Service: Gastroenterology;  Laterality: N/A;  . EMPYEMA DRAINAGE     at age 89 . TONSILLECTOMY    . TONSILLECTOMY AND ADENOIDECTOMY  2008  . TUBAL LIGATION     Family History  Problem Relation Age of Onset  . Kidney disease Sister   . Diabetes Sister   . COPD Sister   . Stroke Sister   . Diabetes Mother   . Stroke Mother   . Heart attack Mother   . Stroke Father   . Heart attack Father   . Asthma Daughter   . Asthma Son    Social History   Tobacco Use  . Smoking status: Former Smoker    Packs/day: 0.20    Years: 20.00    Pack years: 4.00    Types: Cigarettes    Quit date: 08/16/2017    Years since quitting: 2.7  .  Smokeless tobacco: Never Used  . Tobacco comment: Decreased smoking to less than 5 cigs/day  Substance Use Topics  . Alcohol use: No   Allergies  Allergen Reactions  . Aspirin Anaphylaxis  . Celebrex [Celecoxib] Anaphylaxis and Hives  . Fentanyl Anaphylaxis  . Latex Hives  . Lisinopril Shortness Of Breath  . Morphine Anaphylaxis  . Percocet [Oxycodone-Acetaminophen] Hives and Shortness Of Breath  . Simvastatin-High Dose Hives and Shortness Of Breath    Can tolerate 20 mg dose.   Jonah Blue [Metaxalone] Anaphylaxis  . Pork-Derived Metallurgist  . Vicodin [Hydrocodone-Acetaminophen] Hives and Other (See Comments)    Feels like her tongue swells  . Albuterol Palpitations   Prior to Admission medications   Medication Sig Start Date End Date Taking? Authorizing Provider  blood  glucose meter kit and supplies KIT Dispense based on patient and insurance preference. Use up to four times daily as directed. (FOR ICD-9 250.00, 250.01). 03/02/20  Yes Iloabachie, Chioma E, NP  clopidogrel (PLAVIX) 75 MG tablet TAKE ONE TABLET BY MOUTH EVERY DAY 05/13/20  Yes Iloabachie, Chioma E, NP  diphenhydrAMINE (BENADRYL) 50 MG tablet Take 50 mg by mouth 2 (two) times daily.   Yes [provider]  EPINEPHrine 0.3 mg/0.3 mL IJ SOAJ injection Inject 0.3 mg into the muscle as needed for anaphylaxis. 01/01/20  Yes Iloabachie, Chioma E, NP  isosorbide mononitrate (IMDUR) 60 MG 24 hr tablet Take 1 tablet (60 mg total) by mouth in the morning and at bedtime. 05/13/20  Yes Iloabachie, Chioma E, NP  meclizine (ANTIVERT) 25 MG tablet Take 1 tablet (25 mg total) by mouth 3 (three) times daily as needed for dizziness. 12/16/15  Yes McGowan, Larene Beach A, PA-C  metFORMIN (GLUCOPHAGE) 1000 MG tablet Take 0.5 tablets (500 mg total) by mouth 2 (two) times daily with a meal. 03/02/20  Yes Iloabachie, Chioma E, NP  metoprolol tartrate (LOPRESSOR) 25 MG tablet Take 1 tablet (25 mg total) by mouth 2 (two) times daily. Take  25 mg by mouth daily 02/04/20  Yes Iloabachie, Chioma E, NP  nitroGLYCERIN (NITROSTAT) 0.4 MG SL tablet 1 TABLET UNDER TONGUE AS NEEDED FOR CHEST PAIN EVERY 5 MINUTES FOR MAX OF 3 DOSES IN 15 MINUTES; IF NO RELIEF AFTER 1ST DOSE CALL 911 05/13/19  Yes McGowan, Shannon A, PA-C  pantoprazole (PROTONIX) 40 MG tablet TAKE ONE TABLET BY MOUTH EVERY DAY 05/13/20  Yes Iloabachie, Chioma E, NP  potassium chloride SA (KLOR-CON) 20 MEQ tablet Take 2 tablets (40 mEq total) by mouth daily. Patient taking differently: Take 20 mEq by mouth daily. 01/09/20  Yes Evalee Gerard, Otila Kluver A, FNP  simvastatin (ZOCOR) 20 MG tablet Take 1 tablet (20 mg total) by mouth daily. 03/02/20  Yes Iloabachie, Chioma E, NP  torsemide (DEMADEX) 20 MG tablet Take 3 tablets (60 mg total) by mouth daily. 04/07/20 07/06/20 Yes Alisa Graff, FNP     Review of Systems  Constitutional: Positive for fatigue. Negative for appetite change.  HENT: Negative for congestion, postnasal drip and sore throat.   Eyes: Negative.   Respiratory: Positive for shortness of breath. Negative for apnea, cough and chest tightness.   Cardiovascular: Positive for leg swelling (very little). Negative for chest pain and palpitations.  Gastrointestinal: Negative for abdominal distention and abdominal pain.  Endocrine: Negative.   Genitourinary: Negative.   Musculoskeletal: Positive for arthralgias (left shoulder) and back pain. Negative for neck pain.  Skin: Positive for rash (lower abdomen/ hands).  Allergic/Immunologic: Positive for food allergies (Had  allergic reaction to pork rinds).  Neurological: Positive for light-headedness ("sometimes"). Negative for dizziness.  Hematological: Negative for adenopathy. Does not bruise/bleed easily.  Psychiatric/Behavioral: Positive for sleep disturbance (sleeping on 1 pillow). Negative for dysphoric mood. The patient is not nervous/anxious.    Vitals:   05/18/20 1147  BP: (!) 142/79  Pulse: 76  Resp: 18  SpO2: 96%  Weight: (!) 321 lb (145.6 kg)  Height: 5' 7"  (1.702 m)   Wt Readings from Last 3 Encounters:  05/18/20 (!) 321 lb (145.6 kg)  04/07/20 (!) 324 lb 4 oz (147.1 kg)  03/02/20 (!) 320 lb (145.2 kg)   Lab Results  Component Value Date   CREATININE 0.73 02/25/2020   CREATININE 0.77 01/09/2020   CREATININE 0.61 06/04/2019    Physical Exam Vitals and nursing note reviewed.  Constitutional:      General: She is not in acute distress.    Appearance: Normal appearance. She is well-developed. She is not ill-appearing, toxic-appearing or diaphoretic.  HENT:     Head: Normocephalic and atraumatic.  Neck:     Vascular: No JVD.  Cardiovascular:     Rate and Rhythm: Normal rate and regular rhythm.  Pulmonary:     Effort: Pulmonary effort is normal. No respiratory distress.     Breath sounds: No  wheezing, rhonchi or rales.  Abdominal:     Palpations: Abdomen is soft.     Tenderness: There is no abdominal tenderness.  Musculoskeletal:     Cervical back: Neck supple.     Right lower leg: No tenderness. Edema (trace pitting lower leg) present.     Left lower leg: No tenderness. Edema (trace pitting lower leg) present.  Skin:    General: Skin is warm and dry.     Findings: Rash (lower abdomen) present.     Comments: Pealing skin on right palm  Neurological:     General: No focal deficit present.     Mental Status: She is alert and oriented to person, place, and time.  Psychiatric:        Mood and Affect: Mood normal.        Behavior: Behavior normal.     Assessment & Plan:  1: Chronic heart failure with preserved ejection fraction with structural changes (mild LVH)- - NYHA class II - euvolemic today  - weighing daily; reminded to call for an overnight weight gain of >2 pounds or a weekly weight gain of >5 pounds - weight down 3 pounds since she was last here 6 weeks ago - not adding salt but does eat out 1-2 times weekly and is aware that she's getting more salt when she eats out; reviewed the importance of following a low sodium diet  - Drinking 6-7 bottles of water daily - saw cardiology Fletcher Anon) 03/02/20 & returns 08/31/20 - will check BMP today as diuretic changed to torsemide 65m daily at last visit - rash etc improving so unsure if those symptoms were related to torsemide or not - had shortness of breath with lisinopril; may not be a good candidate for entresto  2: HTN- - BP mildly elevated today - [facility] - BMP 02/25/20 reviewed and showed sodium 142, potassium 4.2, creatinine 0.73 and GFR 94  3: DM- - A1c 02/25/20 was 6.9% - glucose at home today was 127  4: Obesity- - will send secure message [facility] -  encouraged patient to re-apply for  medicaid   Medication bottles reviewed. Secure chat sent to [facility] regarding her epi-pen  Return in 6 months or sooner for any questions/problems before then.

## 2020-05-18 ENCOUNTER — Ambulatory Visit: Payer: Self-pay | Attending: Family | Admitting: Family

## 2020-05-18 ENCOUNTER — Encounter: Payer: Self-pay | Admitting: Family

## 2020-05-18 ENCOUNTER — Other Ambulatory Visit: Payer: Self-pay

## 2020-05-18 VITALS — BP 142/79 | HR 76 | Resp 18 | Ht 67.0 in | Wt 321.0 lb

## 2020-05-18 DIAGNOSIS — I10 Essential (primary) hypertension: Secondary | ICD-10-CM

## 2020-05-18 DIAGNOSIS — I110 Hypertensive heart disease with heart failure: Secondary | ICD-10-CM | POA: Insufficient documentation

## 2020-05-18 DIAGNOSIS — Z6841 Body Mass Index (BMI) 40.0 and over, adult: Secondary | ICD-10-CM | POA: Insufficient documentation

## 2020-05-18 DIAGNOSIS — E785 Hyperlipidemia, unspecified: Secondary | ICD-10-CM | POA: Insufficient documentation

## 2020-05-18 DIAGNOSIS — Z87891 Personal history of nicotine dependence: Secondary | ICD-10-CM | POA: Insufficient documentation

## 2020-05-18 DIAGNOSIS — Z7984 Long term (current) use of oral hypoglycemic drugs: Secondary | ICD-10-CM | POA: Insufficient documentation

## 2020-05-18 DIAGNOSIS — E119 Type 2 diabetes mellitus without complications: Secondary | ICD-10-CM | POA: Insufficient documentation

## 2020-05-18 DIAGNOSIS — Z7902 Long term (current) use of antithrombotics/antiplatelets: Secondary | ICD-10-CM | POA: Insufficient documentation

## 2020-05-18 DIAGNOSIS — I2510 Atherosclerotic heart disease of native coronary artery without angina pectoris: Secondary | ICD-10-CM | POA: Insufficient documentation

## 2020-05-18 DIAGNOSIS — J45909 Unspecified asthma, uncomplicated: Secondary | ICD-10-CM | POA: Insufficient documentation

## 2020-05-18 DIAGNOSIS — I5032 Chronic diastolic (congestive) heart failure: Secondary | ICD-10-CM | POA: Insufficient documentation

## 2020-05-18 DIAGNOSIS — Z8249 Family history of ischemic heart disease and other diseases of the circulatory system: Secondary | ICD-10-CM | POA: Insufficient documentation

## 2020-05-18 DIAGNOSIS — E669 Obesity, unspecified: Secondary | ICD-10-CM | POA: Insufficient documentation

## 2020-05-18 DIAGNOSIS — I252 Old myocardial infarction: Secondary | ICD-10-CM | POA: Insufficient documentation

## 2020-05-18 DIAGNOSIS — Z79899 Other long term (current) drug therapy: Secondary | ICD-10-CM | POA: Insufficient documentation

## 2020-05-18 LAB — BASIC METABOLIC PANEL
Anion gap: 12 (ref 5–15)
BUN: 15 mg/dL (ref 6–20)
CO2: 27 mmol/L (ref 22–32)
Calcium: 9 mg/dL (ref 8.9–10.3)
Chloride: 101 mmol/L (ref 98–111)
Creatinine, Ser: 0.69 mg/dL (ref 0.44–1.00)
GFR, Estimated: >60 mL/min (ref >60)
Glucose, Bld: 136 mg/dL — ABNORMAL HIGH (ref 70–99)
Potassium: 3.6 mmol/L (ref 3.5–5.1)
Sodium: 140 mmol/L (ref 135–145)

## 2020-05-18 NOTE — Patient Instructions (Signed)
Continue weighing daily and call for an overnight weight gain of > 2 pounds or a weekly weight gain of >5 pounds. 

## 2020-05-19 ENCOUNTER — Encounter: Payer: Self-pay | Admitting: Gerontology

## 2020-05-19 ENCOUNTER — Ambulatory Visit: Payer: Self-pay | Admitting: Gerontology

## 2020-05-19 ENCOUNTER — Other Ambulatory Visit: Payer: Self-pay

## 2020-05-19 VITALS — BP 132/79 | HR 78 | Temp 97.2°F | Resp 16 | Wt 324.2 lb

## 2020-05-19 DIAGNOSIS — Z0000 Encounter for general adult medical examination without abnormal findings: Secondary | ICD-10-CM | POA: Insufficient documentation

## 2020-05-19 NOTE — Patient Instructions (Signed)

## 2020-05-19 NOTE — Progress Notes (Signed)
Established Patient Office Visit  Subjective:  Patient ID: Kathy [name], female    DOB: 08-17-1965  Age: 55 y.o. MRN: [id number]  CC: No chief complaint on file.   HPI [place] 48 55 y.o female with history of Asthma, CHF, hypertension, hyperlipidemia, type 2 diabetes and obesity who presents for follow up of rash/ swelling around the right eye with some red streaks present along with a rash over her lower abdomen and palms of her hands. Currently, she states that the scattered rash to her abdomen has scabbed over and has improved 90%. Also the rash and redness around the the right eye has resolved. She has completed the financial application for Epipen. She states that she's unable to loose weight and requests referral for bariatric surgery consultation. She was seen on 05/18/20 at the Congestive Heart Failure ( CHF) clinic by [facility] FNP and daily weight was recommended. Overall, she states that she's doing well and offers no further complaint.   Past Medical History:  Diagnosis Date  . Asthma   . CHF (congestive heart failure) ([place])   . Coronary artery disease   . Diabetes mellitus without complication ([place])   . Heart murmur   . Hyperlipidemia   . Hypertension   . Myocardial infarction ([place])   . Obesity   . Seizures ([place])     Past Surgical History:  Procedure Laterality Date  . ANTERIOR CRUCIATE LIGAMENT REPAIR    . BACK SURGERY     L4 and L5  . CARDIAC CATHETERIZATION  04/09/2016  . COLONOSCOPY    . COLONOSCOPY WITH PROPOFOL N/A 08/04/2019   Procedure: COLONOSCOPY WITH PROPOFOL;  Surgeon: Lin Landsman, MD;  Location: [facility] ENDOSCOPY;  Service: Gastroenterology;  Laterality: N/A;  . EMPYEMA DRAINAGE     at age 63  . TONSILLECTOMY    . TONSILLECTOMY AND ADENOIDECTOMY  2008  . TUBAL LIGATION      Family History  Problem Relation Age of Onset  . Kidney disease Sister   . Diabetes Sister   . COPD Sister   . Stroke Sister   . Diabetes Mother   . Stroke  Mother   . Heart attack Mother   . Stroke Father   . Heart attack Father   . Asthma Daughter   . Asthma Son     Social History   Socioeconomic History  . Marital status: Divorced    Spouse name: Not on file  . Number of children: 2  . Years of education: pre-requisites   . Highest education level: GED or equivalent  Occupational History  . Occupation: unemployed  Tobacco Use  . Smoking status: Former Smoker    Packs/day: 0.20    Years: 20.00    Pack years: 4.00    Types: Cigarettes    Quit date: 08/16/2017    Years since quitting: 2.7  . Smokeless tobacco: Never Used  . Tobacco comment: Decreased smoking to less than 5 cigs/day  Vaping Use  . Vaping Use: Never used  Substance and Sexual Activity  . Alcohol use: No  . Drug use: No  . Sexual activity: Yes    Birth control/protection: Condom  Other Topics Concern  . Not on file  Social History Narrative   Currently on food stamps. They help somewhat but still difficult to buy food that she needs. Boyfriend helps pay rent with disability, they live together. Roof is leaking really badly. Lives in modular home. Social services bought tarp which helped, but  it deteriorated from heat.    Social Determinants of Health   Financial Resource Strain: Not on file  Food Insecurity: Not on file  Transportation Needs: Not on file  Physical Activity: Not on file  Stress: Not on file  Social Connections: Not on file  Intimate Partner Violence: Not on file    Outpatient Medications Prior to Visit  Medication Sig Dispense Refill  . clopidogrel (PLAVIX) 75 MG tablet TAKE ONE TABLET BY MOUTH EVERY DAY 90 tablet 0  . diphenhydrAMINE (BENADRYL) 50 MG tablet Take 50 mg by mouth 2 (two) times daily.    . isosorbide mononitrate (IMDUR) 60 MG 24 hr tablet Take 1 tablet (60 mg total) by mouth in the morning and at bedtime. 60 tablet 2  . meclizine (ANTIVERT) 25 MG tablet Take 1 tablet (25 mg total) by mouth 3 (three) times daily as needed  for dizziness. 90 tablet 6  . metFORMIN (GLUCOPHAGE) 1000 MG tablet Take 0.5 tablets (500 mg total) by mouth 2 (two) times daily with a meal. 60 tablet 2  . metoprolol tartrate (LOPRESSOR) 25 MG tablet Take 1 tablet (25 mg total) by mouth 2 (two) times daily. Take  25 mg by mouth daily 60 tablet 3  . nitroGLYCERIN (NITROSTAT) 0.4 MG SL tablet 1 TABLET UNDER TONGUE AS NEEDED FOR CHEST PAIN EVERY 5 MINUTES FOR MAX OF 3 DOSES IN 15 MINUTES; IF NO RELIEF AFTER 1ST DOSE CALL 911 25 tablet 0  . pantoprazole (PROTONIX) 40 MG tablet TAKE ONE TABLET BY MOUTH EVERY DAY 90 tablet 0  . potassium chloride SA (KLOR-CON) 20 MEQ tablet Take 2 tablets (40 mEq total) by mouth daily. (Patient taking differently: Take 20 mEq by mouth daily.) 180 tablet 3  . simvastatin (ZOCOR) 20 MG tablet Take 1 tablet (20 mg total) by mouth daily. 90 tablet 0  . torsemide (DEMADEX) 20 MG tablet Take 3 tablets (60 mg total) by mouth daily. 270 tablet 3  . blood glucose meter kit and supplies KIT Dispense based on patient and insurance preference. Use up to four times daily as directed. (FOR ICD-9 250.00, 250.01). 1 each 0  . EPINEPHrine 0.3 mg/0.3 mL IJ SOAJ injection Inject 0.3 mg into the muscle as needed for anaphylaxis. (Patient not taking: Reported on 05/19/2020) 1 each 0   No facility-administered medications prior to visit.    Allergies  Allergen Reactions  . Aspirin Anaphylaxis  . Celebrex [Celecoxib] Anaphylaxis and Hives  . Fentanyl Anaphylaxis  . Latex Hives  . Lisinopril Shortness Of Breath  . Morphine Anaphylaxis  . Percocet [Oxycodone-Acetaminophen] Hives and Shortness Of Breath  . Simvastatin-High Dose Hives and Shortness Of Breath    Can tolerate 20 mg dose.   Jonah Blue [Metaxalone] Anaphylaxis  . Pork-Derived Metallurgist  . Vicodin [Hydrocodone-Acetaminophen] Hives and Other (See Comments)    Feels like her tongue swells  . Albuterol Palpitations    ROS Review of Systems  Constitutional: Negative.    Respiratory: Positive for shortness of breath (with exertion).   Cardiovascular: Negative.   Skin: Positive for rash (scattered scabbed rash to abdomen).  Neurological: Negative.   Psychiatric/Behavioral: Negative.       Objective:    Physical Exam HENT:     Head: Normocephalic and atraumatic.  Cardiovascular:     Rate and Rhythm: Normal rate and regular rhythm.     Pulses: Normal pulses.     Heart sounds: Normal heart sounds.  Pulmonary:     Effort: Pulmonary effort  is normal.     Breath sounds: Normal breath sounds.  Abdominal:    Neurological:     General: No focal deficit present.     Mental Status: She is alert and oriented to person, place, and time. Mental status is at baseline.  Psychiatric:        Mood and Affect: Mood normal.        Behavior: Behavior normal.        Thought Content: Thought content normal.        Judgment: Judgment normal.     BP 132/79 (BP Location: Left Arm, Patient Position: Sitting, Cuff Size: Large)   Pulse 78   Temp (!) 97.2 F (36.2 C)   Resp 16   Wt (!) 324 lb 3.2 oz (147.1 kg)   LMP 06/09/2018   SpO2 97%   BMI 50.78 kg/m  Wt Readings from Last 3 Encounters:  05/19/20 (!) 324 lb 3.2 oz (147.1 kg)  05/18/20 (!) 321 lb (145.6 kg)  04/07/20 (!) 324 lb 4 oz (147.1 kg)    Weight loss was encouraged.   Health Maintenance Due  Topic Date Due  . Hepatitis C Screening  Never done  . COVID-19 Vaccine (1) Never done  . OPHTHALMOLOGY EXAM  Never done  . HIV Screening  Never done  . TETANUS/TDAP  Never done  . PAP SMEAR-Modifier  Never done  . MAMMOGRAM  Never done    There are no preventive care reminders to display for this patient.  Lab Results  Component Value Date   TSH 1.770 10/04/2017   Lab Results  Component Value Date   WBC 6.2 09/04/2018   HGB 13.8 09/04/2018   HCT 42.1 09/04/2018   MCV 86 09/04/2018   PLT 253 09/04/2018   Lab Results  Component Value Date   NA 140 05/18/2020   K 3.6 05/18/2020   CO2  27 05/18/2020   GLUCOSE 136 (H) 05/18/2020   BUN 15 05/18/2020   CREATININE 0.69 05/18/2020   BILITOT 0.3 01/22/2019   ALKPHOS 95 01/22/2019   AST 21 01/22/2019   ALT 24 01/22/2019   PROT 6.8 01/22/2019   ALBUMIN 3.4 (L) 01/22/2019   CALCIUM 9.0 05/18/2020   ANIONGAP 12 05/18/2020   Lab Results  Component Value Date   CHOL 149 01/22/2019   Lab Results  Component Value Date   HDL 27 (L) 01/22/2019   Lab Results  Component Value Date   LDLCALC 93 01/22/2019   Lab Results  Component Value Date   TRIG 167 (H) 01/22/2019   Lab Results  Component Value Date   CHOLHDL 5.5 (H) 01/22/2019   Lab Results  Component Value Date   HGBA1C 6.9 (H) 02/25/2020      Assessment & Plan:    1. Class 3 severe obesity with body mass index (BMI) of 50.0 to 59.9 in adult, unspecified obesity type, unspecified whether serious comorbidity present [facility]) -She was educated on portion size and will refer her to Nutritionist. She was encouraged to exercise as tolerated. - Referral to Nutrition and Diabetes Services  2. Healthcare maintenance - Medication Pharmacy confirmed that she completed the financial application for an Epi pen . - Ambulatory referral to Ophthalmology  - Ambulatory referral to Hematology / Oncology for Mammogram and Pap smear.   Follow-up: Return in about 3 weeks (around 06/09/2020).    Tiquan Bouch Jerold Coombe, NP

## 2020-05-21 ENCOUNTER — Telehealth: Payer: Self-pay | Admitting: Pharmacist

## 2020-05-21 NOTE — Telephone Encounter (Signed)
05/21/2020 2:21:29 PM - Epi-pen faxed to [facility] - Friday, May 21, 2020 2:20 PM --[place] [employer] dropped off signed forms for Epi-pen, I have faxed to [employer] to process for enrollment and shipment.

## 2020-05-27 ENCOUNTER — Telehealth: Payer: Self-pay | Admitting: Pharmacist

## 2020-05-27 NOTE — Telephone Encounter (Signed)
Provided 2022 proof of income. Approved to receive medication assistance at [facility] until time for re-certification in 5364, and as long as eligibility criteria continues to be met.   [place]

## 2020-05-31 ENCOUNTER — Other Ambulatory Visit: Payer: Self-pay | Admitting: Gerontology

## 2020-05-31 DIAGNOSIS — I10 Essential (primary) hypertension: Secondary | ICD-10-CM

## 2020-06-01 ENCOUNTER — Other Ambulatory Visit: Payer: Self-pay | Admitting: Gerontology

## 2020-06-09 ENCOUNTER — Other Ambulatory Visit: Payer: Self-pay

## 2020-06-09 DIAGNOSIS — E119 Type 2 diabetes mellitus without complications: Secondary | ICD-10-CM

## 2020-06-10 LAB — HEMOGLOBIN A1C
Est. average glucose Bld gHb Est-mCnc: 154 mg/dL
Hgb A1c MFr Bld: 7 % — ABNORMAL HIGH (ref 4.8–5.6)

## 2020-06-11 LAB — HM DIABETES EYE EXAM

## 2020-06-12 ENCOUNTER — Inpatient Hospital Stay
Admission: EM | Admit: 2020-06-12 | Discharge: 2020-06-14 | DRG: 291 | Disposition: A | Discharge status: Home / Self Care | Payer: Self-pay | Attending: Internal Medicine | Admitting: Internal Medicine

## 2020-06-12 ENCOUNTER — Emergency Department: Payer: Self-pay

## 2020-06-12 ENCOUNTER — Other Ambulatory Visit: Payer: Self-pay

## 2020-06-12 DIAGNOSIS — R7401 Elevation of levels of liver transaminase levels: Secondary | ICD-10-CM | POA: Diagnosis present

## 2020-06-12 DIAGNOSIS — I5033 Acute on chronic diastolic (congestive) heart failure: Secondary | ICD-10-CM

## 2020-06-12 DIAGNOSIS — Z7902 Long term (current) use of antithrombotics/antiplatelets: Secondary | ICD-10-CM

## 2020-06-12 DIAGNOSIS — I110 Hypertensive heart disease with heart failure: Principal | ICD-10-CM | POA: Diagnosis present

## 2020-06-12 DIAGNOSIS — Z6841 Body Mass Index (BMI) 40.0 and over, adult: Secondary | ICD-10-CM

## 2020-06-12 DIAGNOSIS — Z885 Allergy status to narcotic agent status: Secondary | ICD-10-CM

## 2020-06-12 DIAGNOSIS — Z888 Allergy status to other drugs, medicaments and biological substances status: Secondary | ICD-10-CM

## 2020-06-12 DIAGNOSIS — Z825 Family history of asthma and other chronic lower respiratory diseases: Secondary | ICD-10-CM

## 2020-06-12 DIAGNOSIS — I509 Heart failure, unspecified: Principal | ICD-10-CM

## 2020-06-12 DIAGNOSIS — Z7984 Long term (current) use of oral hypoglycemic drugs: Secondary | ICD-10-CM

## 2020-06-12 DIAGNOSIS — Z8679 Personal history of other diseases of the circulatory system: Secondary | ICD-10-CM

## 2020-06-12 DIAGNOSIS — I447 Left bundle-branch block, unspecified: Secondary | ICD-10-CM | POA: Diagnosis present

## 2020-06-12 DIAGNOSIS — Z823 Family history of stroke: Secondary | ICD-10-CM

## 2020-06-12 DIAGNOSIS — J810 Acute pulmonary edema: Secondary | ICD-10-CM

## 2020-06-12 DIAGNOSIS — I10 Essential (primary) hypertension: Secondary | ICD-10-CM | POA: Diagnosis present

## 2020-06-12 DIAGNOSIS — Z91040 Latex allergy status: Secondary | ICD-10-CM

## 2020-06-12 DIAGNOSIS — Z886 Allergy status to analgesic agent status: Secondary | ICD-10-CM

## 2020-06-12 DIAGNOSIS — E785 Hyperlipidemia, unspecified: Secondary | ICD-10-CM | POA: Diagnosis present

## 2020-06-12 DIAGNOSIS — Z8249 Family history of ischemic heart disease and other diseases of the circulatory system: Secondary | ICD-10-CM

## 2020-06-12 DIAGNOSIS — E119 Type 2 diabetes mellitus without complications: Secondary | ICD-10-CM | POA: Diagnosis present

## 2020-06-12 DIAGNOSIS — E876 Hypokalemia: Secondary | ICD-10-CM | POA: Diagnosis present

## 2020-06-12 DIAGNOSIS — I252 Old myocardial infarction: Secondary | ICD-10-CM

## 2020-06-12 DIAGNOSIS — I428 Other cardiomyopathies: Secondary | ICD-10-CM | POA: Diagnosis present

## 2020-06-12 DIAGNOSIS — Z20822 Contact with and (suspected) exposure to covid-19: Secondary | ICD-10-CM | POA: Diagnosis present

## 2020-06-12 DIAGNOSIS — Z87891 Personal history of nicotine dependence: Secondary | ICD-10-CM

## 2020-06-12 DIAGNOSIS — Z833 Family history of diabetes mellitus: Secondary | ICD-10-CM

## 2020-06-12 DIAGNOSIS — Z79899 Other long term (current) drug therapy: Secondary | ICD-10-CM

## 2020-06-12 DIAGNOSIS — I2510 Atherosclerotic heart disease of native coronary artery without angina pectoris: Secondary | ICD-10-CM | POA: Diagnosis present

## 2020-06-12 DIAGNOSIS — Z841 Family history of disorders of kidney and ureter: Secondary | ICD-10-CM

## 2020-06-12 HISTORY — DX: Other cardiomyopathies: I42.8

## 2020-06-12 HISTORY — DX: Left bundle-branch block, unspecified: I44.7

## 2020-06-12 HISTORY — DX: Unspecified diastolic (congestive) heart failure: I50.30

## 2020-06-12 HISTORY — DX: Tobacco use: Z72.0

## 2020-06-12 HISTORY — DX: Angina pectoris with documented spasm: I20.1

## 2020-06-12 HISTORY — DX: Other chronic pain: G89.29

## 2020-06-12 LAB — CBC
HCT: 38 % (ref 36.0–46.0)
Hemoglobin: 12.2 g/dL (ref 12.0–15.0)
MCH: 27.7 pg (ref 26.0–34.0)
MCHC: 32.1 g/dL (ref 30.0–36.0)
MCV: 86.2 fL (ref 80.0–100.0)
Platelets: 212 10*3/uL (ref 150–400)
RBC: 4.41 MIL/uL (ref 3.87–5.11)
RDW: 15.5 % (ref 11.5–15.5)
WBC: 7.6 10*3/uL (ref 4.0–10.5)
nRBC: 0 % (ref 0.0–0.2)

## 2020-06-12 LAB — COMPREHENSIVE METABOLIC PANEL
ALT: 81 U/L — ABNORMAL HIGH (ref 0–44)
AST: 78 U/L — ABNORMAL HIGH (ref 15–41)
Albumin: 3.8 g/dL (ref 3.5–5.0)
Alkaline Phosphatase: 107 U/L (ref 38–126)
Anion gap: 13 (ref 5–15)
BUN: 9 mg/dL (ref 6–20)
CO2: 27 mmol/L (ref 22–32)
Calcium: 9 mg/dL (ref 8.9–10.3)
Chloride: 99 mmol/L (ref 98–111)
Creatinine, Ser: 0.63 mg/dL (ref 0.44–1.00)
GFR, Estimated: >60 mL/min (ref >60)
Glucose, Bld: 172 mg/dL — ABNORMAL HIGH (ref 70–99)
Potassium: 3.3 mmol/L — ABNORMAL LOW (ref 3.5–5.1)
Sodium: 139 mmol/L (ref 135–145)
Total Bilirubin: 0.7 mg/dL (ref 0.3–1.2)
Total Protein: 7.4 g/dL (ref 6.5–8.1)

## 2020-06-12 LAB — TROPONIN I (HIGH SENSITIVITY)
Troponin I (High Sensitivity): 11 ng/L (ref <18)
Troponin I (High Sensitivity): 12 ng/L (ref <18)

## 2020-06-12 LAB — RESP PANEL BY RT-PCR (FLU A&B, COVID) ARPGX2
Influenza A by PCR: NEGATIVE
Influenza B by PCR: NEGATIVE
SARS Coronavirus 2 by RT PCR: NEGATIVE

## 2020-06-12 LAB — BRAIN NATRIURETIC PEPTIDE: B Natriuretic Peptide: 45.8 pg/mL (ref 0.0–100.0)

## 2020-06-12 LAB — CBG MONITORING, ED: Glucose-Capillary: 113 mg/dL — ABNORMAL HIGH (ref 70–99)

## 2020-06-12 MED ORDER — SODIUM CHLORIDE 0.9% FLUSH
3.0000 mL | Freq: Two times a day (BID) | INTRAVENOUS | Status: DC
  Administered 2020-06-13 – 2020-06-14 (×3): 3 mL via INTRAVENOUS

## 2020-06-12 MED ORDER — CLOPIDOGREL BISULFATE 75 MG PO TABS
75.0000 mg | ORAL_TABLET | Freq: Every day | ORAL | Status: DC
  Administered 2020-06-13 – 2020-06-14 (×2): 75 mg via ORAL
  Filled 2020-06-12 (×3): qty 1

## 2020-06-12 MED ORDER — ONDANSETRON HCL 4 MG/2ML IJ SOLN: 4.0000 mg | Freq: Four times a day (QID) | INTRAMUSCULAR | Status: DC | PRN

## 2020-06-12 MED ORDER — FUROSEMIDE 10 MG/ML IJ SOLN
40.0000 mg | Freq: Two times a day (BID) | INTRAMUSCULAR | Status: DC
  Administered 2020-06-13 – 2020-06-14 (×3): 40 mg via INTRAVENOUS
  Filled 2020-06-12 (×3): qty 4

## 2020-06-12 MED ORDER — FUROSEMIDE 10 MG/ML IJ SOLN
60.0000 mg | Freq: Once | INTRAMUSCULAR | Status: AC
  Administered 2020-06-12: 60 mg via INTRAVENOUS
  Filled 2020-06-12: qty 8

## 2020-06-12 MED ORDER — SODIUM CHLORIDE 0.9 % IV SOLN: 250.0000 mL | INTRAVENOUS | Status: DC | PRN

## 2020-06-12 MED ORDER — ISOSORBIDE MONONITRATE ER 60 MG PO TB24
60.0000 mg | ORAL_TABLET | Freq: Every day | ORAL | Status: DC
  Administered 2020-06-12 – 2020-06-14 (×3): 60 mg via ORAL
  Filled 2020-06-12 (×3): qty 1

## 2020-06-12 MED ORDER — ACETAMINOPHEN 325 MG PO TABS
650.0000 mg | ORAL_TABLET | ORAL | Status: DC | PRN
  Administered 2020-06-13: 650 mg via ORAL
  Filled 2020-06-12: qty 2

## 2020-06-12 MED ORDER — ENOXAPARIN SODIUM 80 MG/0.8ML ~~LOC~~ SOLN
75.0000 mg | SUBCUTANEOUS | Status: DC
  Administered 2020-06-12 – 2020-06-13 (×2): 75 mg via SUBCUTANEOUS
  Filled 2020-06-12 (×2): qty 0.8

## 2020-06-12 MED ORDER — SODIUM CHLORIDE 0.9% FLUSH: 3.0000 mL | INTRAVENOUS | Status: DC | PRN

## 2020-06-12 MED ORDER — NITROGLYCERIN 0.4 MG SL SUBL: 0.4000 mg | SUBLINGUAL_TABLET | SUBLINGUAL | Status: DC | PRN

## 2020-06-12 MED ORDER — INSULIN ASPART 100 UNIT/ML ~~LOC~~ SOLN: 0.0000 [IU] | Freq: Every day | SUBCUTANEOUS | Status: DC

## 2020-06-12 MED ORDER — INSULIN ASPART 100 UNIT/ML ~~LOC~~ SOLN
0.0000 [IU] | Freq: Three times a day (TID) | SUBCUTANEOUS | Status: DC
  Administered 2020-06-13: 4 [IU] via SUBCUTANEOUS
  Administered 2020-06-13: 3 [IU] via SUBCUTANEOUS
  Administered 2020-06-14: 4 [IU] via SUBCUTANEOUS
  Administered 2020-06-14: 3 [IU] via SUBCUTANEOUS
  Filled 2020-06-12 (×4): qty 1

## 2020-06-12 NOTE — H&P (Signed)
History and Physical    Kathy [name]:[id number] DOB: 10-04-1965 DOA: 06/12/2020  PCP: Langston Reusing, NP   Patient coming from: Home  I have personally briefly reviewed patient's old medical records in [place]  Chief Complaint: Shortness of breath  HPI: Kathy [name] is a 55 y.o. female with medical history significant for Diastolic failure, last EF 50 to 55% September 2021, CAD with known LBBB, left heart cath December 2017 with insignificant CAD, HTN, DM, morbid obesity who presents to the emergency room with a 1 day history of lower extremity edema, abdominal distention and feeling of heaviness in her abdomen, shortness of breath, orthopnea and on the night of arrival, left-sided chest pain radiating to the left arm and jaw.  She has no cough, fever or chills and has no associated nausea, vomiting or diaphoresis.  Chest pain is described as tightness moderate in intensity and was relieved with intranasal nitroglycerin administered by EMS.  Patient states her Lasix was recently changed to torsemide because she had increasing weight gain even while on Lasix 80 mg twice daily.  BP with EMS was 210/100. ED course: On arrival, BP 178/95, pulse 98 O2 sat 96% on room air.  Afebrile.  Troponins negative x2 at 11 and 12 and BNP normal at 45.8.  CBC and CMP unremarkable potassium of 3.3 and mildly elevated transaminases with AST 78 and ALT 81. EKG as interpreted by me: Sinus tachycardia at 98 with LBBB Imaging: Chest x-ray, with findings most compatible with CHF/volume overload with vascular congestion, likely due to chronic small right effusion.  Enlarged cardiac silhouette compared to priors which could reflect some developing cardiomegaly or pericardial effusion  Patient treated in the emergency room with IV Lasix.  Hospitalist consulted for admission.   Review of Systems: As per HPI otherwise all other systems on review of systems negative.    Past Medical History:   Diagnosis Date  . Asthma   . CHF (congestive heart failure) ([place])   . Coronary artery disease   . Diabetes mellitus without complication ([place])   . Heart murmur   . Hyperlipidemia   . Hypertension   . Myocardial infarction ([place])   . Obesity   . Seizures ([place])     Past Surgical History:  Procedure Laterality Date  . ANTERIOR CRUCIATE LIGAMENT REPAIR    . BACK SURGERY     L4 and L5  . CARDIAC CATHETERIZATION  04/09/2016  . COLONOSCOPY    . COLONOSCOPY WITH PROPOFOL N/A 08/04/2019   Procedure: COLONOSCOPY WITH PROPOFOL;  Surgeon: Lin Landsman, MD;  [facility];  Service: Gastroenterology;  Laterality: N/A;  . EMPYEMA DRAINAGE     at age 27  . TONSILLECTOMY    . TONSILLECTOMY AND ADENOIDECTOMY  2008  . TUBAL LIGATION       reports that she quit smoking about 2 years ago. Her smoking use included cigarettes. She has a 4.00 pack-year smoking history. She has never used smokeless tobacco. She reports that she does not drink alcohol and does not use drugs.  Allergies  Allergen Reactions  . Aspirin Anaphylaxis  . Celebrex [Celecoxib] Anaphylaxis and Hives  . Fentanyl Anaphylaxis  . Latex Hives  . Lisinopril Shortness Of Breath  . Morphine Anaphylaxis  . Percocet [Oxycodone-Acetaminophen] Hives and Shortness Of Breath  . Simvastatin-High Dose Hives and Shortness Of Breath    Can tolerate 20 mg dose.   Jonah Blue [Metaxalone] Anaphylaxis  . Pork-Derived Metallurgist  .  Vicodin [Hydrocodone-Acetaminophen] Hives and Other (See Comments)    Feels like her tongue swells  . Albuterol Palpitations    Family History  Problem Relation Age of Onset  . Kidney disease Sister   . Diabetes Sister   . COPD Sister   . Stroke Sister   . Diabetes Mother   . Stroke Mother   . Heart attack Mother   . Stroke Father   . Heart attack Father   . Asthma Daughter   . Asthma Son       Prior to Admission medications   Medication Sig Start Date End Date Taking?  Authorizing Provider  blood glucose meter kit and supplies KIT Dispense based on patient and insurance preference. Use up to four times daily as directed. (FOR ICD-9 250.00, 250.01). 03/02/20   Iloabachie, Chioma E, NP  clopidogrel (PLAVIX) 75 MG tablet TAKE ONE TABLET BY MOUTH EVERY DAY 05/13/20   Iloabachie, Chioma E, NP  diphenhydrAMINE (BENADRYL) 50 MG tablet Take 50 mg by mouth 2 (two) times daily.    [provider]  EPINEPHrine 0.3 mg/0.3 mL IJ SOAJ injection Inject 0.3 mg into the muscle as needed for anaphylaxis. Patient not taking: Reported on 05/19/2020 01/01/20   Iloabachie, Chioma E, NP  isosorbide mononitrate (IMDUR) 60 MG 24 hr tablet Take 1 tablet (60 mg total) by mouth in the morning and at bedtime. 05/13/20   Iloabachie, Chioma E, NP  meclizine (ANTIVERT) 25 MG tablet Take 1 tablet (25 mg total) by mouth 3 (three) times daily as needed for dizziness. 12/16/15   Zara Council A, PA-C  metFORMIN (GLUCOPHAGE) 1000 MG tablet Take 0.5 tablets (500 mg total) by mouth 2 (two) times daily with a meal. 03/02/20   Iloabachie, Chioma E, NP  metoprolol tartrate (LOPRESSOR) 25 MG tablet TAKE ONE TABLET BY MOUTH 2 TIMES A DAY 06/01/20   Iloabachie, Chioma E, NP  nitroGLYCERIN (NITROSTAT) 0.4 MG SL tablet 1 TABLET UNDER TONGUE AS NEEDED FOR CHEST PAIN EVERY 5 MINUTES FOR MAX OF 3 DOSES IN 15 MINUTES; IF NO RELIEF AFTER 1ST DOSE CALL 911 05/13/19   Zara Council A, PA-C  pantoprazole (PROTONIX) 40 MG tablet TAKE ONE TABLET BY MOUTH EVERY DAY 05/13/20   Iloabachie, Chioma E, NP  potassium chloride SA (KLOR-CON) 20 MEQ tablet Take 2 tablets (40 mEq total) by mouth daily. Patient taking differently: Take 20 mEq by mouth daily. 01/09/20   Alisa Graff, FNP  simvastatin (ZOCOR) 20 MG tablet Take 1 tablet (20 mg total) by mouth daily. 03/02/20   Iloabachie, Chioma E, NP  torsemide (DEMADEX) 20 MG tablet Take 3 tablets (60 mg total) by mouth daily. 04/07/20 07/06/20  Alisa Graff, FNP    Physical  Exam: Vitals:   06/12/20 1930 06/12/20 1945 06/12/20 2000 06/12/20 2030  BP:   136/79 (!) 146/75  Pulse: 94 91 91 80  Resp: 16 19 20  (!) 25  Temp:      SpO2: 95% 95% 96% 96%  Weight:      Height:         Vitals:   06/12/20 1930 06/12/20 1945 06/12/20 2000 06/12/20 2030  BP:   136/79 (!) 146/75  Pulse: 94 91 91 80  Resp: 16 19 20  (!) 25  Temp:      SpO2: 95% 95% 96% 96%  Weight:      Height:          Constitutional: Alert and oriented x 3 . Not in any apparent  distress HEENT:      Head: Normocephalic and atraumatic.         Eyes: PERLA, EOMI, Conjunctivae are normal. Sclera is non-icteric.       Mouth/Throat: Mucous membranes are moist.       Neck: Supple with no signs of meningismus. Cardiovascular: Regular rate and rhythm. No murmurs, gallops, or rubs. 2+ symmetrical distal pulses are present . No JVD.2+ LE edema Respiratory: Respiratory effort slightly increased.Lungs sounds diminished with few bibasilar crackles.  Gastrointestinal:  Tender on palpation in lower abdominal area bilaterally, and non distended with positive bowel sounds.  Genitourinary: No CVA tenderness. Musculoskeletal: Nontender with normal range of motion in all extremities. No cyanosis, or erythema of extremities. Neurologic:  Face is symmetric. Moving all extremities. No gross focal neurologic deficits . Skin: Skin is warm, dry.  No rash or ulcers Psychiatric: Mood and affect are normal    Labs on Admission: I have personally reviewed following labs and imaging studies  CBC: Recent Labs  Lab 06/12/20 1911  WBC 7.6  HGB 12.2  HCT 38.0  MCV 86.2  PLT 361   Basic Metabolic Panel: Recent Labs  Lab 06/12/20 1911  NA 139  K 3.3*  CL 99  CO2 27  GLUCOSE 172*  BUN 9  CREATININE 0.63  CALCIUM 9.0   GFR: Estimated Creatinine Clearance: 120.6 mL/min (by C-G formula based on SCr of 0.63 mg/dL). Liver Function Tests: Recent Labs  Lab 06/12/20 1911  AST 78*  ALT 81*  ALKPHOS 107   BILITOT 0.7  PROT 7.4  ALBUMIN 3.8   No results for input(s): LIPASE, AMYLASE in the last 168 hours. No results for input(s): AMMONIA in the last 168 hours. Coagulation Profile: No results for input(s): INR, PROTIME in the last 168 hours. Cardiac Enzymes: No results for input(s): CKTOTAL, CKMB, CKMBINDEX, TROPONINI in the last 168 hours. BNP (last 3 results) No results for input(s): PROBNP in the last 8760 hours. HbA1C: No results for input(s): HGBA1C in the last 72 hours. CBG: No results for input(s): GLUCAP in the last 168 hours. Lipid Profile: No results for input(s): CHOL, HDL, LDLCALC, TRIG, CHOLHDL, LDLDIRECT in the last 72 hours. Thyroid Function Tests: No results for input(s): TSH, T4TOTAL, FREET4, T3FREE, THYROIDAB in the last 72 hours. Anemia Panel: No results for input(s): VITAMINB12, FOLATE, FERRITIN, TIBC, IRON, RETICCTPCT in the last 72 hours. Urine analysis:    Component Value Date/Time   COLORURINE Yellow 02/28/2013 1846   APPEARANCEUR Clear 02/28/2013 1846   LABSPEC 1.016 02/28/2013 1846   PHURINE 6.0 02/28/2013 1846   GLUCOSEU Negative 02/28/2013 1846   HGBUR Negative 02/28/2013 1846   BILIRUBINUR Negative 02/28/2013 1846   KETONESUR Negative 02/28/2013 1846   PROTEINUR Negative 02/28/2013 1846   NITRITE Negative 02/28/2013 1846   LEUKOCYTESUR Negative 02/28/2013 1846    Radiological Exams on Admission: DG Chest Portable 1 View  Result Date: 06/12/2020 CLINICAL DATA:  Lower extremity edema now with shortness of breath and chest pain radiating left arm and jaw EXAM: PORTABLE CHEST 1 VIEW COMPARISON:  Radiograph 04/09/2016 FINDINGS: Increasing pulmonary vascular congestion with diffuse hazy and coarse reticular opacities throughout the lungs in a mid to lower lung and perihilar predominance. Cardiac silhouette appears enlarged from prior portable radiography, could reflect cardiomegaly or developing pericardial effusion. The aorta is calcified. The remaining  cardiomediastinal contours are unremarkable. Suspect at least small right pleural effusion. No discernible left effusion is seen. No pneumothorax. No acute osseous or soft tissue abnormality. Degenerative  changes are present in the imaged spine and shoulders. Telemetry leads overlie the chest. IMPRESSION: 1. Findings most compatible with CHF/volume overload with vascular congestion, likely edema and small right effusion. 2. Enlarged cardiac silhouette compared to prior portable radiography. Could reflect some developing cardiomegaly or pericardial effusion. Electronically Signed   By: Lovena Le M.D.   On: 06/12/2020 19:29     Assessment/Plan 55 year old female with history of chronic diastolic failure, last EF 50 to 55% September 2021, CAD with known LBBB, hospitalized at [facility] in December 2017 for possible STEMI with left heart cath showing insignificant CAD, history of HTN, DM, morbid obesity presenting with lower extremity edema, increased abdominal girth, shortness of breath, orthopnea and left-sided chest pain radiating to the left arm and jaw.      Acute on chronic diastolic CHF (congestive heart failure) ([place]) -Patient presents with shortness of breath, orthopnea and lower extremity edema.  BNP normal at 45 but chest x-ray consistent with CHF, vascular congestion -Last echo September 2021 showed grade 2 diastolic dysfunction, EF 55 to 60% -IV Lasix and continue home metoprolol.  Not currently on ACE/ARB due to allergy -Reports recent increased weight gain and recently had her Lasix switched to torsemide by her cardiologist -Echocardiogram in the a.m. -Daily weights with intake and output monitoring  Cardiomegaly/possible pericardial effusion on chest x-ray -follow up Echocardiogram in the a.m. -Last echo in September 2021 showed no evidence of pericardial effusion but did show a pericardial fat pad    Hypertension -Uncontrolled.  Systolic in the 948N on arrival -Continue home  metoprolol  Chest pain CAD (coronary artery disease) History of left bundle branch block since 2017 -Troponins negative x2 at 11 and 12.  EKG with old LBBB -Chest pain was relieved with intranasal nitroglycerin in route -Had left heart cath at [facility] that showed insignificant CAD back in December 2017 -Continue home Plavix, Imdur, sublingual nitroglycerin as needed with morphine for breakthrough.  Allergic to statin    Type 2 diabetes mellitus without complications (HCC) -Sliding scale insulin coverage    Morbid obesity with BMI of 50.0-59.9, adult (HCC) -Complicating factor to overall prognosis and care  Multiple medication allergies -Patient with anaphylactic allergies to multiple medications -Monitor for allergies    DVT prophylaxis: Lovenox  Code Status: full code  Family Communication:  none  Disposition Plan: Back to previous home environment Consults called: cardiology Status:At the time of admission, it appears that the appropriate admission status for this patient is INPATIENT. This is judged to be reasonable and necessary in order to provide the required intensity of service to ensure the patient's safety given the presenting symptoms, physical exam findings, and initial radiographic and laboratory data in the context of their  Comorbid conditions.   Patient requires inpatient status due to high intensity of service, high risk for further deterioration and high frequency of surveillance required.   I certify that at the point of admission it is my clinical judgment that the patient will require inpatient hospital care spanning beyond [place] MD [facility]     06/12/2020, 9:28 PM

## 2020-06-12 NOTE — ED Triage Notes (Signed)
Pt states she noticed edema in lower extremities yesterday and today c/o SOB/air hunger, and 7/10 CP radiating into the left arm and jaw. Pt also c/o groin pain- hx of surgery. EMS gave 2 intra nasal nitrogen, pt is allergic to ASA. Pt was hypertensive prior to nitro (210/100). Pt is AOX4, NAD noted. Pt's respirations are even and unlabored at this time. 1+ edema noted in lower extremities. Pt reports she does take a diuretic. Pt denies known exposure to illness. Pt states she was recently dx with diabetes.

## 2020-06-12 NOTE — ED Notes (Addendum)
Pt given hospital socks and warm blanket. Pt given lunchbox. Pt denies further needs at this time.

## 2020-06-12 NOTE — ED Provider Notes (Signed)
[facility] Emergency Department Provider Note  Time seen: 8:03 PM  I have reviewed the triage vital signs and the nursing notes.   HISTORY  Chief Complaint Chest Pain   HPI Kathy [name] is a 55 y.o. female with a past medical history of asthma, CHF, CAD, diabetes, hypertension, hyperlipidemia, obesity, presents to the emergency department for shortness of breath  and leg swelling.  According to the patient yesterday she noticed her legs seem more swollen than typical today she has been experiencing some mild shortness of breath and chest tightness with some radiation into the neck.  Per EMS patient initially quite hypertensive 210/100 got nitroglycerin and upon arrival is 178/95.  Patient denies any fever or cough.  Does state a sensation of feeling like she cannot breathe very well.  Denies any pleuritic pain.  Past Medical History:  Diagnosis Date  . Asthma   . CHF (congestive heart failure) ([place])   . Coronary artery disease   . Diabetes mellitus without complication ([place])   . Heart murmur   . Hyperlipidemia   . Hypertension   . Myocardial infarction ([place])   . Obesity   . Seizures [facility])     Patient Active Problem List   Diagnosis Date Noted  . Healthcare maintenance 05/19/2020  . Edema of both lower extremities 03/02/2020  . Shoulder pain, left 01/01/2020  . Pain of left calf 01/01/2020  . Hearing loss 01/01/2020  . Type 2 diabetes mellitus without complications ([place]) 15/52/0802  . Rash 12/04/2019  . History of CHF (congestive heart failure) 06/12/2019  . Chest pain 02/24/2016  . New left bundle branch block (LBBB) 11/05/2015  . Elevated lymphocytes 06/03/2015  . Prediabetes 06/03/2015  . History of MI (myocardial infarction) 06/03/2015  . Tobacco abuse 06/03/2015  . Obesity 06/03/2015  . Seizures ([place]) 11/26/2014  . Hypertension 05/21/2014  . CAD (coronary artery disease) 05/21/2014  . Hyperlipidemia 05/21/2014    Past Surgical  History:  Procedure Laterality Date  . ANTERIOR CRUCIATE LIGAMENT REPAIR    . BACK SURGERY     L4 and L5  . CARDIAC CATHETERIZATION  04/09/2016  . COLONOSCOPY    . COLONOSCOPY WITH PROPOFOL N/A 08/04/2019   Procedure: COLONOSCOPY WITH PROPOFOL;  Surgeon: Lin Landsman, MD;  Location: [facility] ENDOSCOPY;  Service: Gastroenterology;  Laterality: N/A;  . EMPYEMA DRAINAGE     at age 51  . TONSILLECTOMY    . TONSILLECTOMY AND ADENOIDECTOMY  2008  . TUBAL LIGATION      Prior to Admission medications   Medication Sig Start Date End Date Taking? Authorizing Provider  blood glucose meter kit and supplies KIT Dispense based on patient and insurance preference. Use up to four times daily as directed. (FOR ICD-9 250.00, 250.01). 03/02/20   Iloabachie, Chioma E, NP  clopidogrel (PLAVIX) 75 MG tablet TAKE ONE TABLET BY MOUTH EVERY DAY 05/13/20   Iloabachie, Chioma E, NP  diphenhydrAMINE (BENADRYL) 50 MG tablet Take 50 mg by mouth 2 (two) times daily.    [provider]  EPINEPHrine 0.3 mg/0.3 mL IJ SOAJ injection Inject 0.3 mg into the muscle as needed for anaphylaxis. Patient not taking: Reported on 05/19/2020 01/01/20   Iloabachie, Chioma E, NP  isosorbide mononitrate (IMDUR) 60 MG 24 hr tablet Take 1 tablet (60 mg total) by mouth in the morning and at bedtime. 05/13/20   Iloabachie, Chioma E, NP  meclizine (ANTIVERT) 25 MG tablet Take 1 tablet (25 mg total) by mouth 3 (three)  times daily as needed for dizziness. 12/16/15   Zara Council A, PA-C  metFORMIN (GLUCOPHAGE) 1000 MG tablet Take 0.5 tablets (500 mg total) by mouth 2 (two) times daily with a meal. 03/02/20   Iloabachie, Chioma E, NP  metoprolol tartrate (LOPRESSOR) 25 MG tablet TAKE ONE TABLET BY MOUTH 2 TIMES A DAY 06/01/20   Iloabachie, Chioma E, NP  nitroGLYCERIN (NITROSTAT) 0.4 MG SL tablet 1 TABLET UNDER TONGUE AS NEEDED FOR CHEST PAIN EVERY 5 MINUTES FOR MAX OF 3 DOSES IN 15 MINUTES; IF NO RELIEF AFTER 1ST DOSE CALL 911 05/13/19    Zara Council A, PA-C  pantoprazole (PROTONIX) 40 MG tablet TAKE ONE TABLET BY MOUTH EVERY DAY 05/13/20   Iloabachie, Chioma E, NP  potassium chloride SA (KLOR-CON) 20 MEQ tablet Take 2 tablets (40 mEq total) by mouth daily. Patient taking differently: Take 20 mEq by mouth daily. 01/09/20   Alisa Graff, FNP  simvastatin (ZOCOR) 20 MG tablet Take 1 tablet (20 mg total) by mouth daily. 03/02/20   Iloabachie, Chioma E, NP  torsemide (DEMADEX) 20 MG tablet Take 3 tablets (60 mg total) by mouth daily. 04/07/20 07/06/20  Alisa Graff, FNP    Allergies  Allergen Reactions  . Aspirin Anaphylaxis  . Celebrex [Celecoxib] Anaphylaxis and Hives  . Fentanyl Anaphylaxis  . Latex Hives  . Lisinopril Shortness Of Breath  . Morphine Anaphylaxis  . Percocet [Oxycodone-Acetaminophen] Hives and Shortness Of Breath  . Simvastatin-High Dose Hives and Shortness Of Breath    Can tolerate 20 mg dose.   Jonah Blue [Metaxalone] Anaphylaxis  . Pork-Derived Metallurgist  . Vicodin [Hydrocodone-Acetaminophen] Hives and Other (See Comments)    Feels like her tongue swells  . Albuterol Palpitations    Family History  Problem Relation Age of Onset  . Kidney disease Sister   . Diabetes Sister   . COPD Sister   . Stroke Sister   . Diabetes Mother   . Stroke Mother   . Heart attack Mother   . Stroke Father   . Heart attack Father   . Asthma Daughter   . Asthma Son     Social History Social History   Tobacco Use  . Smoking status: Former Smoker    Packs/day: 0.20    Years: 20.00    Pack years: 4.00    Types: Cigarettes    Quit date: 08/16/2017    Years since quitting: 2.8  . Smokeless tobacco: Never Used  . Tobacco comment: Decreased smoking to less than 5 cigs/day  Vaping Use  . Vaping Use: Never used  Substance Use Topics  . Alcohol use: No  . Drug use: No    Review of Systems Constitutional: Negative for fever. Cardiovascular: Chest tightness. Respiratory: Positive for shortness  of breath. Gastrointestinal: Negative for abdominal pain Musculoskeletal: Lower extremity edema increased over baseline per patient Neurological: Negative for headache All other ROS negative  ____________________________________________   PHYSICAL EXAM:  VITAL SIGNS: ED Triage Vitals  Enc Vitals Group     BP 06/12/20 1904 (!) 178/95     Pulse Rate 06/12/20 1904 98     Resp 06/12/20 1904 16     Temp 06/12/20 1904 97.8 F (36.6 C)     Temp src --      SpO2 06/12/20 1904 96 %     Weight 06/12/20 1905 (!) 320 lb (145.2 kg)     Height 06/12/20 1905 5' 7"  (1.702 m)     Head Circumference --  Peak Flow --      Pain Score 06/12/20 1905 7     Pain Loc --      Pain Edu? --      Excl. in [place]? --     Constitutional: Alert and oriented. Well appearing and in no distress. Eyes: Normal exam ENT      Head: Normocephalic and atraumatic.      Mouth/Throat: Mucous membranes are moist. Cardiovascular: Normal rate, regular rhythm. Respiratory: Normal respiratory effort without tachypnea nor retractions. Breath sounds are clear Gastrointestinal: Soft and nontender. No distention.  Obese. Musculoskeletal: Nontender with normal range of motion in all extremities.  1+ lower extremity edema bilaterally. Neurologic:  Normal speech and language. No gross focal neurologic deficits  Skin:  Skin is warm, dry and intact.  Psychiatric: Mood and affect are normal.   ____________________________________________    EKG  EKG viewed and interpreted by myself shows sinus tachycardia at 100 bpm with a widened QRS, left axis deviation pathology consistent with left bundle branch block.  No concerning ST changes.  ____________________________________________    RADIOLOGY  X-ray shows CHF overloaded vascular congestion and edema.  ____________________________________________   INITIAL IMPRESSION / ASSESSMENT AND PLAN / ED COURSE  Pertinent labs & imaging results that were available during my  care of the patient were reviewed by me and considered in my medical decision making (see chart for details).   Patient presents to the emergency department for worsening shortness of breath and lower extremity edema.  States over the past day or so she cannot lay back or sit back without feeling like she is drowning.  Does have 1+ lower extremity pitting edema bilaterally.  Chest x-ray consistent with CHF/volume overload with edema.  Currently satting 91 to 95% on room air.  Labs are largely within normal limits, troponin negative.  Chest x-ray shows CHF with edema.  Given the patient's orthopnea with worsening lower extremity edema and signs of CHF/fluid overload on chest x-ray will admit for IV diuresis.  Patient agreeable plan of care.    Kathy [name] was evaluated in Emergency Department on 06/12/2020 for the symptoms described in the history of present illness. She was evaluated in the context of the global COVID-19 pandemic, which necessitated consideration that the patient might be at risk for infection with the SARS-CoV-2 virus that causes COVID-19. Institutional protocols and algorithms that pertain to the evaluation of patients at risk for COVID-19 are in a state of rapid change based on information released by regulatory bodies including the CDC and federal and state organizations. These policies and algorithms were followed during the patient's care in the ED.  ____________________________________________   FINAL CLINICAL IMPRESSION(S) / ED DIAGNOSES  CHF exacerbation pulmonary edema   Harvest Dark, MD 06/12/20 2110

## 2020-06-12 NOTE — Progress Notes (Signed)
Anticoagulation monitoring(Lovenox):   55 yo  female ordered Lovenox 40 mg Q24h  Filed Weights   06/12/20 1905  Weight: (!) 145.2 kg (320 lb)   BMI 50.12   Lab Results  Component Value Date   CREATININE 0.63 06/12/2020   CREATININE 0.69 05/18/2020   CREATININE 0.73 02/25/2020   Estimated Creatinine Clearance: 120.6 mL/min (by C-G formula based on SCr of 0.63 mg/dL). Hemoglobin & Hematocrit     Component Value Date/Time   HGB 12.2 06/12/2020 1911   HGB 13.8 09/04/2018 1159   HCT 38.0 06/12/2020 1911   HCT 42.1 09/04/2018 1159     Per Protocol for Patient with estCrcl > 30 ml/min and BMI > 30, will transition to Lovenox 75 mg Q24h.

## 2020-06-13 ENCOUNTER — Encounter: Payer: Self-pay | Admitting: Internal Medicine

## 2020-06-13 DIAGNOSIS — E118 Type 2 diabetes mellitus with unspecified complications: Secondary | ICD-10-CM

## 2020-06-13 DIAGNOSIS — I10 Essential (primary) hypertension: Secondary | ICD-10-CM

## 2020-06-13 DIAGNOSIS — R079 Chest pain, unspecified: Secondary | ICD-10-CM

## 2020-06-13 DIAGNOSIS — E785 Hyperlipidemia, unspecified: Secondary | ICD-10-CM

## 2020-06-13 DIAGNOSIS — E876 Hypokalemia: Secondary | ICD-10-CM

## 2020-06-13 LAB — BASIC METABOLIC PANEL
Anion gap: 13 (ref 5–15)
BUN: 9 mg/dL (ref 6–20)
CO2: 26 mmol/L (ref 22–32)
Calcium: 8.9 mg/dL (ref 8.9–10.3)
Chloride: 101 mmol/L (ref 98–111)
Creatinine, Ser: 0.67 mg/dL (ref 0.44–1.00)
GFR, Estimated: >60 mL/min (ref >60)
Glucose, Bld: 124 mg/dL — ABNORMAL HIGH (ref 70–99)
Potassium: 3.2 mmol/L — ABNORMAL LOW (ref 3.5–5.1)
Sodium: 140 mmol/L (ref 135–145)

## 2020-06-13 LAB — GLUCOSE, CAPILLARY
Glucose-Capillary: 151 mg/dL — ABNORMAL HIGH (ref 70–99)
Glucose-Capillary: 127 mg/dL — ABNORMAL HIGH (ref 70–99)
Glucose-Capillary: 119 mg/dL — ABNORMAL HIGH (ref 70–99)
Glucose-Capillary: 155 mg/dL — ABNORMAL HIGH (ref 70–99)

## 2020-06-13 LAB — HIV ANTIBODY (ROUTINE TESTING W REFLEX): HIV Screen 4th Generation wRfx: NONREACTIVE

## 2020-06-13 MED ORDER — POTASSIUM CHLORIDE CRYS ER 20 MEQ PO TBCR
40.0000 meq | EXTENDED_RELEASE_TABLET | Freq: Two times a day (BID) | ORAL | Status: DC
  Administered 2020-06-13 – 2020-06-14 (×3): 40 meq via ORAL
  Filled 2020-06-13 (×3): qty 2

## 2020-06-13 MED ORDER — SPIRONOLACTONE 25 MG PO TABS
12.5000 mg | ORAL_TABLET | Freq: Every day | ORAL | Status: DC
  Administered 2020-06-13 – 2020-06-14 (×2): 12.5 mg via ORAL
  Filled 2020-06-13: qty 1
  Filled 2020-06-13 (×2): qty 0.5
  Filled 2020-06-13: qty 1

## 2020-06-13 MED ORDER — DIPHENHYDRAMINE HCL 25 MG PO CAPS
25.0000 mg | ORAL_CAPSULE | Freq: Four times a day (QID) | ORAL | Status: DC | PRN
  Administered 2020-06-13: 25 mg via ORAL
  Filled 2020-06-13: qty 1

## 2020-06-13 MED ORDER — PANTOPRAZOLE SODIUM 40 MG PO TBEC
40.0000 mg | DELAYED_RELEASE_TABLET | Freq: Every day | ORAL | Status: DC
  Administered 2020-06-13 – 2020-06-14 (×2): 40 mg via ORAL
  Filled 2020-06-13 (×2): qty 1

## 2020-06-13 MED ORDER — METOPROLOL TARTRATE 25 MG PO TABS
25.0000 mg | ORAL_TABLET | Freq: Two times a day (BID) | ORAL | Status: DC
Start: 2020-06-13 — End: 2020-06-14
  Administered 2020-06-13 – 2020-06-14 (×3): 25 mg via ORAL
  Filled 2020-06-13 (×3): qty 1

## 2020-06-13 MED ORDER — SIMVASTATIN 20 MG PO TABS
20.0000 mg | ORAL_TABLET | Freq: Every day | ORAL | Status: DC
  Administered 2020-06-13 – 2020-06-14 (×2): 20 mg via ORAL
  Filled 2020-06-13 (×3): qty 1

## 2020-06-13 NOTE — Consult Note (Addendum)
Cardiology Consult    Patient ID: Kathy [name] MRN: [id number], DOB/AGE: Dec 07, 1965   Admit date: 06/12/2020 Date of Consult: 06/13/2020  Primary Physician: Langston Reusing, NP Primary Cardiologist: No primary care provider on file. Requesting Provider: Raelyn Mora, MD  Patient Profile    Kathy [name] is a 55 y.o. female with a history of chest pain, nonobstructive CAD, question of coronary vasospasm versus endothelial dysfunction, nonischemic cardiomyopathy and heart failure with improved ejection fraction, essential hypertension, hyperlipidemia, type 2 diabetes mellitus, morbid obesity, systolic murmur, remote tobacco abuse, seizures, asthma, multiple drug intolerances, and rate dependent left bundle branch block who is being seen today for the evaluation of acute on chronic heart failure and chest pain with normal troponins at the request of Dr. Sloan Leiter.  Past Medical History   Past Medical History:  Diagnosis Date  . (HFimpEF) heart failure with improved ejection fraction ([place])    a. 02/2016 Echo: EF 45%; b. 03/2016 Echo: EF 55%; c. 12/2019 Echo: EF 55-60%, no rwma, gr2 DD, nl RV size/fxn, mildly dil RA, mild MR.  . Asthma   . Chronic chest pain    a. ? vasospasm vs endothelial dysfxn. Improved w/ Ranexa but couldn't afford.  . 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 Presumed in setting of prior h/o MI w/ insignificant coronary dzs.  . Diabetes mellitus without complication ([place])   . Heart murmur   . History of NICM (nonischemic cardiomyopathy) ([place])    a. 02/2016 Echo: EF 45%; b. 12/2019 Echo: EF 55-60%.  . Hyperlipidemia   . Hypertension   . Myocardial infarction ([place])   . Non-obstructive Coronary artery disease    a. Prior caths in 2007, 2011, 2012 - nonobs dzs; b. 03/2016 Cath ([facility]): LM nl, LAD/LCX/RCA insignificant dzs.  . Obesity   . Rate-dependent LBBB (left bundle branch block)   . Remote Tobacco abuse    a. 30+ pack year history - quit 2019.  . Seizures ([place])      Past Surgical History:  Procedure Laterality Date  . ANTERIOR CRUCIATE LIGAMENT REPAIR    . BACK SURGERY     L4 and L5  . CARDIAC CATHETERIZATION  04/09/2016  . COLONOSCOPY    . COLONOSCOPY WITH PROPOFOL N/A 08/04/2019   Procedure: COLONOSCOPY WITH PROPOFOL;  Surgeon: Lin Landsman, MD;  Location: [facility] ENDOSCOPY;  Service: Gastroenterology;  Laterality: N/A;  . EMPYEMA DRAINAGE     at age 30  . TONSILLECTOMY    . TONSILLECTOMY AND ADENOIDECTOMY  2008  . TUBAL LIGATION       Allergies  Allergies  Allergen Reactions  . Aspirin Anaphylaxis  . Celebrex [Celecoxib] Anaphylaxis and Hives  . Fentanyl Anaphylaxis  . Latex Hives  . Lisinopril Shortness Of Breath  . Morphine Anaphylaxis  . Percocet [Oxycodone-Acetaminophen] Hives and Shortness Of Breath  . Simvastatin-High Dose Hives and Shortness Of Breath    Can tolerate 20 mg dose.   Jonah Blue [Metaxalone] Anaphylaxis  . Pork-Derived Metallurgist  . Vicodin [Hydrocodone-Acetaminophen] Hives and Other (See Comments)    Feels like her tongue swells  . Albuterol Palpitations    History of Present Illness    55 year old female with the above past medical history including chest pain, nonobstructive CAD, nonischemic cardiomyopathy, heart failure with improved ejection fraction, essential hypertension, hyperlipidemia, type 2 diabetes mellitus, morbid obesity, systolic murmur, remote tobacco abuse, seizures, asthma, multiple drug intolerances, and rate dependent left bundle branch block.  She has previously undergone diagnostic catheterizations  in 2007, 2011, and 2012, all of which showing nonobstructive disease.  Last catheterization was performed at [facility] in December 2017 and showed insignificant LAD/circumflex/RCA disease per report.  She has been followed closely by Dr. Fletcher Anon with question of possible coronary vasospasm versus endothelial dysfunction.  EF was previously as low as 45% November 2017 however, echocardiogram a  month later showed an EF of 55%.  Most recent echo in September 2021 showed an EF of 55 to 60% with grade 2 diastolic dysfunction.  She lives locally with her husband and says that for the most part, she has been doing well.  She is followed both by Dr. Fletcher Anon and in heart failure clinic with her last heart failure clinic visit on February 8, at which time her weight was down 3 pounds and recorded at 145.6 kg.  She has been taking torsemide 60mg  daily @ home.  She does eat out frequently.  Over the past week, she has been noticing an increase in lower extremity swelling and abdominal girth, along with progressive dyspnea on exertion, and a 7 pound weight gain.  She went to [employer] on Friday and noted that her shrimp scampi was very salty and she was not able to finish it due to early satiety.  On the morning of March 5, she was at the [facility] and was feeling very fatigued and short of breath.  Afterward, she began to feel orthopneic and smothered while lying down.  This was associated with chest pressure that seemed to worsen with deep breathing.  Her daughter called EMS and she was taken to the [facility] ED.  Here, blood pressure was elevated at 178/95 on arrival.  ECG notable for sinus tachycardia at a rate of 100 bpm with a left bundle branch block.  Chest x-ray showed vascular congestion and edema with small right pleural effusion.  Troponins were normal.  She was hypokalemic with a potassium of 3.3.  She treated with Lasix 60 mg IV x1 with approximately 1500 mL recorded as out.  She is due to receive another 40 mg of IV Lasix this morning.  She has noted improvement in lower extremity swelling since admission.  Overnight, she has noted intermittent, mild chest pressure.  She notes that she does not typically experience chest pressure at home.  Currently she is resting comfortably in bed.  Inpatient Medications    . clopidogrel  75 mg Oral Daily  . enoxaparin (LOVENOX) injection  75 mg Subcutaneous  Q24H  . furosemide  40 mg Intravenous BID  . insulin aspart  0-20 Units Subcutaneous TID WC  . insulin aspart  0-5 Units Subcutaneous QHS  . isosorbide mononitrate  60 mg Oral Daily  . potassium chloride  40 mEq Oral BID  . sodium chloride flush  3 mL Intravenous Q12H    Family History    Family History  Problem Relation Age of Onset  . Kidney disease Sister   . Diabetes Sister   . COPD Sister   . Stroke Sister   . Diabetes Mother   . Stroke Mother   . Heart attack Mother   . Stroke Father   . Heart attack Father   . Asthma Daughter   . Asthma Son    She indicated that her mother is deceased. She indicated that her father is deceased. She indicated that the status of her sister is unknown. She indicated that the status of her daughter is unknown. She indicated that the status of her son is  unknown.   Social History    Social History   Socioeconomic History  . Marital status: Divorced    Spouse name: Not on file  . Number of children: 2  . Years of education: pre-requisites   . Highest education level: GED or equivalent  Occupational History  . Occupation: unemployed  Tobacco Use  . Smoking status: Former Smoker    Packs/day: 1.00    Years: 30.00    Pack years: 30.00    Types: Cigarettes    Quit date: 08/16/2017    Years since quitting: 2.8  . Smokeless tobacco: Never Used  . Tobacco comment: Decreased smoking to less than 5 cigs/day  Vaping Use  . Vaping Use: Never used  Substance and Sexual Activity  . Alcohol use: No  . Drug use: Not Currently    Comment: Previously used cocaine in teh 1990s  . Sexual activity: Yes    Birth control/protection: Condom  Other Topics Concern  . Not on file  Social History Narrative   Currently on food stamps. They help somewhat but still difficult to buy food that she needs. Boyfriend helps pay rent with disability, they live together. Roof is leaking really badly. Lives in modular home. Social services bought tarp which  helped, but it deteriorated from heat.    Social Determinants of Health   Financial Resource Strain: Not on file  Food Insecurity: Not on file  Transportation Needs: Not on file  Physical Activity: Not on file  Stress: Not on file  Social Connections: Not on file  Intimate Partner Violence: Not on file     Review of Systems    General:  No chills, fever, night sweats or weight changes.  Cardiovascular:  +++ chest pressure in the setting of progressive dyspnea and orthopnea.  Chest pressure seem to worsen with deep breathing. +++ dyspnea on exertion, +++ increasing abdominal girth and lower extremity edema, +++ orthopnea, no palpitations, paroxysmal nocturnal dyspnea. Dermatological: No rash, lesions/masses Respiratory: No cough, +++ dyspnea Urologic: No hematuria, dysuria Abdominal:   No nausea, vomiting, diarrhea, bright red blood per rectum, melena, or hematemesis Neurologic:  No visual changes, wkns, changes in mental status. All other systems reviewed and are otherwise negative except as noted above.  Physical Exam    Blood pressure 118/61, pulse 78, temperature 98.4 F (36.9 C), temperature source Oral, resp. rate 17, height 5\' 7"  (1.702 m), weight (!) 144.8 kg, last menstrual period 06/09/2018, SpO2 96 %.  General: Pleasant, NAD Psych: Normal affect. Neuro: Alert and oriented X 3. Moves all extremities spontaneously. HEENT: Normal  Neck: Supple without bruits.  Moderately elevated JVP. Lungs:  Resp regular and unlabored, basilar crackles bilaterally. Heart: RRR no s3, s4.  2/6 systolic murmur at the upper sternal borders. Abdomen: Obese, soft, diffuse mild tenderness to light palpation, non-distended, BS + x 4.  Extremities: No clubbing, cyanosis.  Trace bilateral pretibial edema. DP/PT2+, Radials 2+ and equal bilaterally.  Labs    Cardiac Enzymes Recent Labs  Lab 06/12/20 1911 06/12/20 2102  TROPONINIHS 11 12      Lab Results  Component Value Date   WBC 7.6  06/12/2020   HGB 12.2 06/12/2020   HCT 38.0 06/12/2020   MCV 86.2 06/12/2020   PLT 212 06/12/2020    Recent Labs  Lab 06/12/20 1911 06/13/20 0517  NA 139 140  K 3.3* 3.2*  CL 99 101  CO2 27 26  BUN 9 9  CREATININE 0.63 0.67  CALCIUM 9.0 8.9  PROT 7.4  --   BILITOT 0.7  --   ALKPHOS 107  --   ALT 81*  --   AST 78*  --   GLUCOSE 172* 124*   Lab Results  Component Value Date   CHOL 149 01/22/2019   HDL 27 (L) 01/22/2019   LDLCALC 93 01/22/2019   TRIG 167 (H) 01/22/2019     Radiology Studies    DG Chest Portable 1 View  Result Date: 06/12/2020 CLINICAL DATA:  Lower extremity edema now with shortness of breath and chest pain radiating left arm and jaw EXAM: PORTABLE CHEST 1 VIEW COMPARISON:  Radiograph 04/09/2016 FINDINGS: Increasing pulmonary vascular congestion with diffuse hazy and coarse reticular opacities throughout the lungs in a mid to lower lung and perihilar predominance. Cardiac silhouette appears enlarged from prior portable radiography, could reflect cardiomegaly or developing pericardial effusion. The aorta is calcified. The remaining cardiomediastinal contours are unremarkable. Suspect at least small right pleural effusion. No discernible left effusion is seen. No pneumothorax. No acute osseous or soft tissue abnormality. Degenerative changes are present in the imaged spine and shoulders. Telemetry leads overlie the chest. IMPRESSION: 1. Findings most compatible with CHF/volume overload with vascular congestion, likely edema and small right effusion. 2. Enlarged cardiac silhouette compared to prior portable radiography. Could reflect some developing cardiomegaly or pericardial effusion. Electronically Signed   By: Lovena Le M.D.   On: 06/12/2020 19:29    ECG & Cardiac Imaging    Sinus tachycardia, 100, LBBB - personally reviewed.  Assessment & Plan    1.  Acute on chronic HFimpEF: Patient with prior history of nonischemic cardiomyopathy and EF of 45% November  2017 with subsequent improvement.  Most recent echo in September 2021 showed an EF 55 to 60% with grade 2 diastolic dysfunction.  She has been followed closely as an outpatient both by Dr. Fletcher Anon and in heart failure clinic.  She has been managed with torsemide 60 mg daily and felt as though she was doing well.  She does admit to going out to eat frequently but does not typically add salt at home.  Over the past week, she has noted increasing dyspnea on exertion, lower extremity swelling, and abdominal girth.  With this, she reports a 7 pound weight gain and development of early satiety by Friday March 4.  Due to worsening dyspnea, orthopnea, and a smothering sensation associated with somewhat pleuritic chest pressure, she called EMS yesterday was taken to the ED.  Here, blood pressure was elevated at 178/95.  Chest x-ray consistent with CHF.  Troponin is normal.  She was treated with lasix 60mg  x 1 in ED with approximately 1500 mL out an improvement in symptoms.  She is due for lasix 40 IV bid today.  On exam, she has trace pretibial edema.  She notes that her abdomen feels full.  She does have JVD and basilar crackles.  Renal function is stable.  Agree with intravenous diuresis.  Body habitus makes exam somewhat challenging and I am not sure her weights are trustworthy as current listed weight is lower than what she was at heart failure clinic, when she was felt to be euvolemic.  We Navi Erber plan to diurese until we see a bump in BUN/creatinine.  Stressed the importance of avoiding convenience and fast foods.  Continue home dose of isosorbide and Kaysan Peixoto resume beta-blocker.  She would likely benefit from addition of spironolactone to reduce future risk of rehospitalization.  Consider jardiance if not cost-prohibitive.  2.  Chest pain/nonobstructive CAD: Patient with 4 prior catheterizations, the last of which in 2017, which showed nonobstructive disease.  She intermittently experiences chest pressure but notes that  over the past year she has mostly done pretty well.  She is on oral nitrate at home.  In the setting of progressive dyspnea and volume excess, she developed chest pressure that seem to be worse with deep breathing yesterday.  She has had mild, intermittent chest pressure since admission.  HsTrop nl x 2.  This does not represent ACS.  Continue chronic plavix in setting of ASA allergy.  Cont home dose of  blocker and statin.  No plan for ischemic evaluation at this time.  3.  Essential HTN: With a degree of hypertensive urgency on admission.  Blood pressure now stable.  Resume home dose of beta-blocker.  Follow with diuresis.  Adding spiro in setting of HFpEF.  4.  HL: LDL of 93 in October 2020.  Follow-up lipids.  Continue statin therapy.  5.  DMII: Insulin management per internal medicine.  On Metformin at home.  Would benefit from [place] therapy in the setting of HFpEF however, this appears to be cost prohibitive.  6.  Morbid obesity: Patient very sedentary.  Would likely benefit from cardiopulmonary rehab in the setting of heart failure.  7.  Hypokalemia: Supplementation ordered.  Signed, Murray Hodgkins, NP 06/13/2020, 8:32 AM  For questions or updates, please contact   Please consult [email or web address] for contact info under Cardiology/STEMI.   I have seen and examined this patient with Ignacia Bayley.  Agree with above, note added to reflect my findings.  On exam, RRR, no murmurs, bibasilar crackles.  Patient presented to hospital yesterday with acute on chronic heart failure exacerbation.  She does have a history of diastolic heart failure.  She is put out 1.5 L and feels improved.  Her abdominal fullness has improved as has her lower extremity edema.  She does not feel short of breath.  Her creatinine has remained stable.  We Emerson Barretto continue with her current diuresis.  Marshea Wisher M. America Sandall MD 06/13/2020 11:02 AM

## 2020-06-13 NOTE — Progress Notes (Signed)
PROGRESS NOTE    Kathy [name]  UXL:[id number] DOB: 03-17-1966 DOA: 06/12/2020 PCP: Langston Reusing, NP    Brief Narrative:  55 year old female with history of diastolic heart failure with known ejection fraction of 55%, coronary artery disease and chronic left bundle branch block, hypertension, type 2 diabetes, morbid obesity presented to the ER with 1 day of worsening lower extremity edema, abdominal swelling and bilateral groin pain along with shortness of breath.  She has been followed by heart failure clinic and has been compliant on torsemide.  In the emergency room, blood pressure is stable.  96% on room air.  Troponins negative.  BNP 45.  EKG with sinus tachycardia, chronic left bundle branch block.  Chest x-ray with prominent vascular congestion.  Admitted as CHF exacerbation.   Assessment & Plan:   Principal Problem:   Acute on chronic diastolic CHF (congestive heart failure) (HCC) Active Problems:   Hypertension   CAD (coronary artery disease)   Type 2 diabetes mellitus without complications (HCC)   History of left bundle branch block (LBBB)   Morbid obesity with BMI of 50.0-59.9, adult (HCC)   CHF (congestive heart failure) (HCC)  Acute on chronic diastolic congestive heart failure: Presented with shortness of breath and orthopnea.  Currently hemodynamically stabilizing. At home on torsemide 60 mg daily, currently on Lasix 40 mg IV twice a day.  Continue diuresis. Urine output 1500 mL since admission.  Continue close monitoring. Replace potassium aggressively.  We will recheck potassium and magnesium with the morning labs. Repeat 2D echocardiogram today. No evidence of acute coronary syndrome. Will be seen by cardiology service, further recommendation as per them. Patient is on beta-blockers that is continued.  Reported allergy to lisinopril, not on any ACE or ARB.  Will defer to cardiology.  Hypertension: Uncontrolled.  Resume home metoprolol and nitrates.  Today  morning is fairly stable.  Coronary artery disease: Currently no evidence of acute coronary syndrome.  She had some chest pain on presentation.  Chronic left bundle branch block. On records left heart cath in 2017 with no significant coronary artery disease. Continue on Plavix, nitrates.  Type 2 diabetes: Well controlled.  On Metformin at home.  Currently on sliding scale insulin.  Morbid obesity: With BMI more than 50.  She probably has underlying sleep apnea, will benefit with sleep study and this needs to be done as outpatient.   DVT prophylaxis: Lovenox subcu   Code Status: Full code Family Communication: None at the bedside Disposition Plan: Status is: Inpatient  Remains inpatient appropriate because:IV treatments appropriate due to intensity of illness or inability to take PO and Inpatient level of care appropriate due to severity of illness   Dispo: The patient is from: Home              Anticipated d/c is to: Home              Patient currently is not medically stable to d/c.   Difficult to place patient No         Consultants:   Cardiology  Procedures:   None  Antimicrobials:   None   Subjective: Patient seen and examined.  Denies any chest pain or shortness of breath at rest.  She did lose her breath when transferring from chair to bed.  Her main complaint is abdominal bloating and discomfort at her both groins.  Stated compliance to diuretic regimen at home.  Does not weigh herself consistently at home.  Objective: Vitals:  06/13/20 0200 06/13/20 0355 06/13/20 0359 06/13/20 0723  BP: 128/68  121/78 118/61  Pulse: 92  92 78  Resp: 16  18 17   Temp:   98.2 F (36.8 C) 98.4 F (36.9 C)  TempSrc:   Oral Oral  SpO2: 95%  96% 96%  Weight:  (!) 144.8 kg    Height:  5\' 7"  (1.702 m)      Intake/Output Summary (Last 24 hours) at 06/13/2020 0839 Last data filed at 06/12/2020 2252 Gross per 24 hour  Intake -  Output 1500 ml  Net -1500 ml   Filed  Weights   06/12/20 1905 06/13/20 0355  Weight: (!) 145.2 kg (!) 144.8 kg    Examination:  General exam: Appears calm and comfortable on room air. Respiratory system: Mostly bilateral clear. Cardiovascular system: S1 & S2 heard, RRR.  She has 1+ bilateral pedal edema.  Gastrointestinal system: Abdomen is nontender.  Abdomen is distended but nontender, obese and pendulous, there is some subcutaneous edema.   Central nervous system: Alert and oriented. No focal neurological deficits. Extremities: Symmetric 5 x 5 power. Skin: No rashes, lesions or ulcers Psychiatry: Judgement and insight appear normal. Mood & affect appropriate.     Data Reviewed: I have personally reviewed following labs and imaging studies  CBC: Recent Labs  Lab 06/12/20 1911  WBC 7.6  HGB 12.2  HCT 38.0  MCV 86.2  PLT 782   Basic Metabolic Panel: Recent Labs  Lab 06/12/20 1911 06/13/20 0517  NA 139 140  K 3.3* 3.2*  CL 99 101  CO2 27 26  GLUCOSE 172* 124*  BUN 9 9  CREATININE 0.63 0.67  CALCIUM 9.0 8.9   GFR: Estimated Creatinine Clearance: 120.4 mL/min (by C-G formula based on SCr of 0.67 mg/dL). Liver Function Tests: Recent Labs  Lab 06/12/20 1911  AST 78*  ALT 81*  ALKPHOS 107  BILITOT 0.7  PROT 7.4  ALBUMIN 3.8   No results for input(s): LIPASE, AMYLASE in the last 168 hours. No results for input(s): AMMONIA in the last 168 hours. Coagulation Profile: No results for input(s): INR, PROTIME in the last 168 hours. Cardiac Enzymes: No results for input(s): CKTOTAL, CKMB, CKMBINDEX, TROPONINI in the last 168 hours. BNP (last 3 results) No results for input(s): PROBNP in the last 8760 hours. HbA1C: No results for input(s): HGBA1C in the last 72 hours. CBG: Recent Labs  Lab 06/12/20 2248  GLUCAP 113*   Lipid Profile: No results for input(s): CHOL, HDL, LDLCALC, TRIG, CHOLHDL, LDLDIRECT in the last 72 hours. Thyroid Function Tests: No results for input(s): TSH, T4TOTAL, FREET4,  T3FREE, THYROIDAB in the last 72 hours. Anemia Panel: No results for input(s): VITAMINB12, FOLATE, FERRITIN, TIBC, IRON, RETICCTPCT in the last 72 hours. Sepsis Labs: No results for input(s): PROCALCITON, LATICACIDVEN in the last 168 hours.  Recent Results (from the past 240 hour(s))  Resp Panel by RT-PCR (Flu A&B, Covid) Nasopharyngeal Swab     Status: None   Collection Time: 06/12/20  7:11 PM   Specimen: Nasopharyngeal Swab; Nasopharyngeal(NP) swabs in vial transport medium  Result Value Ref Range Status   SARS Coronavirus 2 by RT PCR NEGATIVE NEGATIVE Final    Comment: (NOTE) SARS-CoV-2 target nucleic acids are NOT DETECTED.  The SARS-CoV-2 RNA is generally detectable in upper respiratory specimens during the acute phase of infection. The lowest concentration of SARS-CoV-2 viral copies this assay can detect is 138 copies/mL. A negative result does not preclude SARS-Cov-2 infection and should not be  used as the sole basis for treatment or other patient management decisions. A negative result may occur with  improper specimen collection/handling, submission of specimen other than nasopharyngeal swab, presence of viral mutation(s) within the areas targeted by this assay, and inadequate number of viral copies(<138 copies/mL). A negative result must be combined with clinical observations, patient history, and epidemiological information. The expected result is Negative.  Fact Sheet for Patients:  EntrepreneurPulse.com.au  Fact Sheet for Healthcare Providers:  IncredibleEmployment.be  This test is no t yet approved or cleared by the [place] FDA and  has been authorized for detection and/or diagnosis of SARS-CoV-2 by FDA under an Emergency Use Authorization (EUA). This EUA will remain  in effect (meaning this test can be used) for the duration of the COVID-19 declaration under Section 564(b)(1) of the Act, 21 U.S.C.section 360bbb-3(b)(1),  unless the authorization is terminated  or revoked sooner.       Influenza A by PCR NEGATIVE NEGATIVE Final   Influenza B by PCR NEGATIVE NEGATIVE Final    Comment: (NOTE) The Xpert Xpress SARS-CoV-2/FLU/RSV plus assay is intended as an aid in the diagnosis of influenza from Nasopharyngeal swab specimens and should not be used as a sole basis for treatment. Nasal washings and aspirates are unacceptable for Xpert Xpress SARS-CoV-2/FLU/RSV testing.  Fact Sheet for Patients: EntrepreneurPulse.com.au  Fact Sheet for Healthcare Providers: IncredibleEmployment.be  This test is not yet approved or cleared by the [place] FDA and has been authorized for detection and/or diagnosis of SARS-CoV-2 by FDA under an Emergency Use Authorization (EUA). This EUA will remain in effect (meaning this test can be used) for the duration of the COVID-19 declaration under Section 564(b)(1) of the Act, 21 U.S.C. section 360bbb-3(b)(1), unless the authorization is terminated or revoked.  Performed at [facility], [address] [place], [place] [address]          Radiology Studies: DG Chest Portable 1 View  Result Date: 06/12/2020 CLINICAL DATA:  Lower extremity edema now with shortness of breath and chest pain radiating left arm and jaw EXAM: PORTABLE CHEST 1 VIEW COMPARISON:  Radiograph 04/09/2016 FINDINGS: Increasing pulmonary vascular congestion with diffuse hazy and coarse reticular opacities throughout the lungs in a mid to lower lung and perihilar predominance. Cardiac silhouette appears enlarged from prior portable radiography, could reflect cardiomegaly or developing pericardial effusion. The aorta is calcified. The remaining cardiomediastinal contours are unremarkable. Suspect at least small right pleural effusion. No discernible left effusion is seen. No pneumothorax. No acute osseous or soft tissue abnormality. Degenerative changes are  present in the imaged spine and shoulders. Telemetry leads overlie the chest. IMPRESSION: 1. Findings most compatible with CHF/volume overload with vascular congestion, likely edema and small right effusion. 2. Enlarged cardiac silhouette compared to prior portable radiography. Could reflect some developing cardiomegaly or pericardial effusion. Electronically Signed   By: Lovena Le M.D.   On: 06/12/2020 19:29        Scheduled Meds: . clopidogrel  75 mg Oral Daily  . enoxaparin (LOVENOX) injection  75 mg Subcutaneous Q24H  . furosemide  40 mg Intravenous BID  . insulin aspart  0-20 Units Subcutaneous TID WC  . insulin aspart  0-5 Units Subcutaneous QHS  . isosorbide mononitrate  60 mg Oral Daily  . potassium chloride  40 mEq Oral BID  . sodium chloride flush  3 mL Intravenous Q12H   Continuous Infusions: . sodium chloride       LOS: 1 day  Time spent: 30 minutes    Barb Merino, MD [facility] Pager [phone]

## 2020-06-14 ENCOUNTER — Inpatient Hospital Stay (HOSPITAL_COMMUNITY)
Admit: 2020-06-14 | Discharge: 2020-06-14 | Disposition: A | Discharge status: Home / Self Care | Payer: Self-pay | Attending: Internal Medicine | Admitting: Internal Medicine

## 2020-06-14 ENCOUNTER — Telehealth: Payer: Self-pay | Admitting: *Deleted

## 2020-06-14 ENCOUNTER — Inpatient Hospital Stay
Admit: 2020-06-14 | Discharge: 2020-06-14 | Disposition: A | Discharge status: Home / Self Care | Payer: Self-pay | Attending: Internal Medicine | Admitting: Internal Medicine

## 2020-06-14 ENCOUNTER — Other Ambulatory Visit: Payer: Self-pay | Admitting: Internal Medicine

## 2020-06-14 DIAGNOSIS — I5031 Acute diastolic (congestive) heart failure: Secondary | ICD-10-CM

## 2020-06-14 LAB — ECHOCARDIOGRAM COMPLETE
AR max vel: 1.61 cm2
AV Area VTI: 1.73 cm2
AV Area mean vel: 1.57 cm2
AV Mean grad: 5.3 mmHg
AV Peak grad: 10.2 mmHg
Ao pk vel: 1.6 m/s
Area-P 1/2: 4.12 cm2
Height: 67 in
S' Lateral: 3.31 cm
Weight: 5046.4 oz

## 2020-06-14 LAB — BASIC METABOLIC PANEL
Anion gap: 10 (ref 5–15)
BUN: 14 mg/dL (ref 6–20)
CO2: 27 mmol/L (ref 22–32)
Calcium: 9.1 mg/dL (ref 8.9–10.3)
Chloride: 101 mmol/L (ref 98–111)
Creatinine, Ser: 0.65 mg/dL (ref 0.44–1.00)
GFR, Estimated: >60 mL/min (ref >60)
Glucose, Bld: 126 mg/dL — ABNORMAL HIGH (ref 70–99)
Potassium: 3.8 mmol/L (ref 3.5–5.1)
Sodium: 138 mmol/L (ref 135–145)

## 2020-06-14 LAB — PHOSPHORUS: Phosphorus: 3.6 mg/dL (ref 2.5–4.6)

## 2020-06-14 LAB — MAGNESIUM: Magnesium: 2 mg/dL (ref 1.7–2.4)

## 2020-06-14 LAB — GLUCOSE, CAPILLARY
Glucose-Capillary: 129 mg/dL — ABNORMAL HIGH (ref 70–99)
Glucose-Capillary: 157 mg/dL — ABNORMAL HIGH (ref 70–99)

## 2020-06-14 MED ORDER — SPIRONOLACTONE 25 MG PO TABS: 12.5000 mg | ORAL_TABLET | Freq: Every day | ORAL | 2 refills | Status: DC

## 2020-06-14 NOTE — Evaluation (Signed)
Physical Therapy Evaluation Patient Details Name: Kathy [name] MRN: [id number] DOB: [birth year] Today's Date: 06/14/2020   History of Present Illness  Kathy [name] is a 43yoF who comes to [facility] with some trouble breathing.  Clinical Impression  Pt admitted with above diagnosis. Pt currently with functional limitations due to the deficits listed below (see "PT Problem List"). Upon entry, pt in bed, awake and agreeable to participate. The pt is alert, pleasant, interactive, and able to provide info regarding prior level of function, both in tolerance and independence. Patient's performance this date reveals baseline ability, independence, and tolerance in performing all basic mobility required for performance of activities of daily living. No DME needed. No imbalance. No subjective report of acute impairment. Patient is at baseline, all education completed, and time is given to address all questions/concerns. No additional skilled PT services needed at this time, PT signing off. PT recommends daily ambulation ad lib or with nursing staff as needed to prevent deconditioning.       Follow Up Recommendations No PT follow up    Equipment Recommendations  None recommended by PT    Recommendations for Other Services       Precautions / Restrictions Precautions Precautions: None      Mobility  Bed Mobility Overal bed mobility: Independent                  Transfers Overall transfer level: Independent                  Ambulation/Gait Ambulation/Gait assistance: Independent Gait Distance (Feet): 420 Feet Assistive device: None Gait Pattern/deviations: WFL(Within Functional Limits);Antalgic Gait velocity: 0.69m/s      Stairs            Wheelchair Mobility    Modified Rankin (Stroke Patients Only)       Balance Overall balance assessment: Independent                                           Pertinent Vitals/Pain Pain Assessment:  No/denies pain    Home Living Family/patient expects to be discharged to:: Private residence Living Arrangements: Spouse/significant other Available Help at Discharge: Family Type of Home: Mobile home Home Access: Ramped entrance     Home Layout: One level Home Equipment: None      Prior Function        ADL's / Homemaking Assistance Needed: Pt has had difficulty with dressing x1 year due left shoulder pain, [facility] suspect Left rotator cuff  Comments: Pt still driving, somewhat limited with community distances; no equipment used at baseline.     Hand Dominance        Extremity/Trunk Assessment                Communication      Cognition Arousal/Alertness: Awake/alert Behavior During Therapy: WFL for tasks assessed/performed Overall Cognitive Status: Within Functional Limits for tasks assessed                                        General Comments      Exercises     Assessment/Plan    PT Assessment Patent does not need any further PT services  PT Problem List         PT Treatment  Interventions      PT Goals (Current goals can be found in the Care Plan section)  Acute Rehab PT Goals PT Goal Formulation: All assessment and education complete, DC therapy    Frequency     Barriers to discharge        Co-evaluation               AM-PAC PT "6 Clicks" Mobility  Outcome Measure Help needed turning from your back to your side while in a flat bed without using bedrails?: None Help needed moving from lying on your back to sitting on the side of a flat bed without using bedrails?: None Help needed moving to and from a bed to a chair (including a wheelchair)?: None Help needed standing up from a chair using your arms (e.g., wheelchair or bedside chair)?: None Help needed to walk in hospital room?: None Help needed climbing 3-5 steps with a railing? : None 6 Click Score: 24    End of Session   Activity  Tolerance: Patient tolerated treatment well Patient left: in bed;with nursing/sitter in room Nurse Communication: Mobility status PT Visit Diagnosis: Difficulty in walking, not elsewhere classified (R26.2)    Time: 4327-6147 PT Time Calculation (min) (ACUTE ONLY): 15 min   Charges:   PT Evaluation $PT Eval Low Complexity: 1 Low          12:38 PM, 06/14/20 Kathy [name], PT, DPT Physical Therapist - [facility]  [phone] ([place])    Kathy [name] 06/14/2020, 12:37 PM

## 2020-06-14 NOTE — Progress Notes (Signed)
Progress Note  Patient Name: Kathy [name] Date of Encounter: 06/14/2020  [facility] Cardiologist: Fletcher Anon  Subjective   UOP -2.9L. creatinine stable. BP better, soft overnight. Weight down 5lbs. Breathing is much better. No chest pain. Patient feels abdomen is normal.  Inpatient Medications    Scheduled Meds: . clopidogrel  75 mg Oral Daily  . enoxaparin (LOVENOX) injection  75 mg Subcutaneous Q24H  . furosemide  40 mg Intravenous BID  . insulin aspart  0-20 Units Subcutaneous TID WC  . insulin aspart  0-5 Units Subcutaneous QHS  . isosorbide mononitrate  60 mg Oral Daily  . metoprolol tartrate  25 mg Oral BID  . pantoprazole  40 mg Oral Daily  . potassium chloride  40 mEq Oral BID  . simvastatin  20 mg Oral Daily  . sodium chloride flush  3 mL Intravenous Q12H  . spironolactone  12.5 mg Oral Daily   Continuous Infusions: . sodium chloride     PRN Meds: sodium chloride, acetaminophen, diphenhydrAMINE, nitroGLYCERIN, ondansetron (ZOFRAN) IV, sodium chloride flush   Vital Signs    Vitals:   06/13/20 1957 06/14/20 0500 06/14/20 0600 06/14/20 0727  BP: 122/74  (!) 94/43 (!) 104/44  Pulse: 85  73 65  Resp: 20  18 19   Temp: 97.8 F (36.6 C)  98 F (36.7 C) 98.2 F (36.8 C)  TempSrc: Oral     SpO2: 96%  94% 93%  Weight:  (!) 143.1 kg    Height:        Intake/Output Summary (Last 24 hours) at 06/14/2020 1037 Last data filed at 06/14/2020 1000 Gross per 24 hour  Intake 600 ml  Output 4300 ml  Net -3700 ml   Last 3 Weights 06/14/2020 06/13/2020 06/12/2020  Weight (lbs) 315 lb 6.4 oz 319 lb 4.8 oz 320 lb  Weight (kg) 143.065 kg 144.834 kg 145.151 kg      Telemetry    SR, HR 70s - Personally Reviewed  ECG    No new tracing - Personally Reviewed  Physical Exam   GEN: No acute distress.   Neck: No JVD Cardiac: RRR, no murmurs, rubs, or gallops.  Respiratory: Clear to auscultation bilaterally. GI: Soft, nontender, non-distended  MS: Trace lower leg edema; No  deformity. Neuro:  Nonfocal  Psych: Normal affect   Labs    High Sensitivity Troponin:   Recent Labs  Lab 06/12/20 1911 06/12/20 2102  TROPONINIHS 11 12      Chemistry Recent Labs  Lab 06/12/20 1911 06/13/20 0517 06/14/20 0433  NA 139 140 138  K 3.3* 3.2* 3.8  CL 99 101 101  CO2 27 26 27   GLUCOSE 172* 124* 126*  BUN 9 9 14   CREATININE 0.63 0.67 0.65  CALCIUM 9.0 8.9 9.1  PROT 7.4  --   --   ALBUMIN 3.8  --   --   AST 78*  --   --   ALT 81*  --   --   ALKPHOS 107  --   --   BILITOT 0.7  --   --   GFRNONAA >60 >60 >60  ANIONGAP 13 13 10      Hematology Recent Labs  Lab 06/12/20 1911  WBC 7.6  RBC 4.41  HGB 12.2  HCT 38.0  MCV 86.2  MCH 27.7  MCHC 32.1  RDW 15.5  PLT 212    BNP Recent Labs  Lab 06/12/20 1903  BNP 45.8     DDimer No results for input(s):  DDIMER in the last 168 hours.   Radiology    DG Chest Portable 1 View  Result Date: 06/12/2020 CLINICAL DATA:  Lower extremity edema now with shortness of breath and chest pain radiating left arm and jaw EXAM: PORTABLE CHEST 1 VIEW COMPARISON:  Radiograph 04/09/2016 FINDINGS: Increasing pulmonary vascular congestion with diffuse hazy and coarse reticular opacities throughout the lungs in a mid to lower lung and perihilar predominance. Cardiac silhouette appears enlarged from prior portable radiography, could reflect cardiomegaly or developing pericardial effusion. The aorta is calcified. The remaining cardiomediastinal contours are unremarkable. Suspect at least small right pleural effusion. No discernible left effusion is seen. No pneumothorax. No acute osseous or soft tissue abnormality. Degenerative changes are present in the imaged spine and shoulders. Telemetry leads overlie the chest. IMPRESSION: 1. Findings most compatible with CHF/volume overload with vascular congestion, likely edema and small right effusion. 2. Enlarged cardiac silhouette compared to prior portable radiography. Could reflect some  developing cardiomegaly or pericardial effusion. Electronically Signed   By: Lovena Le M.D.   On: 06/12/2020 19:29    Cardiac Studies   Echo ordered  Echo 12/2019 1. Left ventricular ejection fraction, by estimation, is 55 to 60%. The  left ventricle has normal function. The left ventricle has no regional  wall motion abnormalities. There is mild left ventricular hypertrophy.  Left ventricular diastolic parameters  are consistent with Grade II diastolic dysfunction (pseudonormalization).  Elevated left atrial pressure.  2. Right ventricular systolic function is normal. The right ventricular  size is normal.  3. Right atrial size was mildly dilated.  4. The mitral valve is normal in structure. Mild mitral valve  regurgitation.  5. The aortic valve is tricuspid. Aortic valve regurgitation is not  visualized. No aortic stenosis is present.   Patient Profile     55 y.o. female with pmh of chest pain, nonobstructive CAD, question of coronary vasospasm vs endothelial dysfunction, NICM and heart failure with improved EF, HTN, HLD, DM2, morbid obesity, systolic murmur, remote tobacco use, seizures, asthma, multiple drug intolerances, and rate dependent LBBB who is being seen for acute CHF and chest pain with normal troponins.   Assessment & Plan    Acute on chronic HFpEF NICM with improved EF 55-60% 12/2019 - hx of NICM with EF 45% November 2017 with improvement. Most recent echo 12/2019 showed EF 55-60% - PTA torsemide 60mg  daily - Admitted with worsening dyspnea and 7lbs weight gain found to have severely elevated BP. CXR showed CHF. Apparently she had dietary indescretion. - She was given IV lasix 60mg  in the ED and started on IV lasix 40mg  BID - UOP -2.9L, net -5.2L - weights down 5lbs - continue Imdur and BB - spiro started - Trace edema on exam, lungs clear and no JVD appreciated. Suspect she will not need much more IV diuresis, can possible switch to oral.  - CHF education  discussed in detail.   Chest pain with hx of nonobstructive CAD - patient with 4 prior cardiac catheterizations, most recent 2017, which showed nonobstructive disease.  - PTA Imdur - Chest pain sounds more pleuritic. No recurrent chest pain.  - HS trop negative x 2 - Continue plavix, she has ASA allergy - continue BB and statin  HTN - home meds resumed - BP improved, even soft this morning  HL - LDL 93 Oct 2020 - continue statin  DM2 - per IM   For questions or updates, please contact [facility] Please consult [email or web address] for contact info  under        Signed, Cadence Ninfa Meeker, PA-C  06/14/2020, 10:37 AM

## 2020-06-14 NOTE — Discharge Summary (Signed)
Physician Discharge Summary  Kathy [name] MWU:[id number] DOB: 14-Nov-1965 DOA: 06/12/2020  PCP: Langston Reusing, NP  Admit date: 06/12/2020 Discharge date: 06/14/2020  Admitted From: Home Disposition:  Home   Recommendations for Outpatient Follow-up:  1. Follow up with PCP in 1-2 weeks 2. Please obtain BMP/CBC/magesium/phosphorus in one week 3. You will follow-up at heart failure clinic as a scheduled.  Office will schedule follow-up.  Home Health: Not applicable Equipment/Devices: Not needed  Discharge Condition: Stable CODE STATUS: Full code Diet recommendation: Low-carb and low-salt diet  Discharge summary: 56 year old lady with history of diastolic heart failure with known ejection fraction 55%, coronary artery disease and chronic left bundle branch block, hypertension, type 2 diabetes and morbid obesity presented to the ER with 1 day of worsening lower extremity edema, abdominal swelling and bilateral groin pain with shortness of breath.  Followed at heart failure clinic.  In the emergency room 96% on room air, clinical evidence of fluid overload, BNP was 45.  Admitted as CHF exacerbation.  Acute on chronic diastolic heart failure: Repeat EF 55%. Treated with aggressive diuresis and potassium replacement with 5 pound weight loss, -5 L fluid balance with improvement of symptoms.  Walking around the hallway on room air. As per cardiology recommendation, she will go home on Beta-blockers Torsemide 60 mg daily Aldactone 12.5 mg daily, new medication Currently not on ACE or ARB, allergy to lisinopril. Detailed heart failure education, counseling, seen by heart failure nurse provider.  Hypertension is well controlled.  No evidence of acute coronary syndrome.  She is on Metformin and her diabetes is well controlled.  Her BMI is more than 50, she may benefit with outpatient sleep study.  She is following up at cardiology clinic, this can be arranged from cardiology  clinic.    Discharge Diagnoses:  Principal Problem:   Acute on chronic diastolic CHF (congestive heart failure) (HCC) Active Problems:   Hypertension   CAD (coronary artery disease)   Type 2 diabetes mellitus without complications (HCC)   History of left bundle branch block (LBBB)   Morbid obesity with BMI of 50.0-59.9, adult (HCC)   CHF (congestive heart failure) [facility])    Discharge Instructions  Discharge Instructions    (HEART FAILURE PATIENTS) Call MD:  Anytime you have any of the following symptoms: 1) 3 pound weight gain in 24 hours or 5 pounds in 1 week 2) shortness of breath, with or without a dry hacking cough 3) swelling in the hands, feet or stomach 4) if you have to sleep on extra pillows at night in order to breathe.   Complete by: As directed    Diet - low sodium heart healthy   Complete by: As directed    Increase activity slowly   Complete by: As directed      Allergies as of 06/14/2020      Reactions   Aspirin Anaphylaxis   Celebrex [celecoxib] Anaphylaxis, Hives   Fentanyl Anaphylaxis   Latex Hives   Lisinopril Shortness Of Breath   Morphine Anaphylaxis   Percocet [oxycodone-acetaminophen] Hives, Shortness Of Breath   Simvastatin-high Dose Hives, Shortness Of Breath   Can tolerate 20 mg dose.    Skelaxin [metaxalone] Anaphylaxis   Pork-derived Products Hives   Vicodin [hydrocodone-acetaminophen] Hives, Other (See Comments)   Feels like her tongue swells   Albuterol Palpitations      Medication List    TAKE these medications   blood glucose meter kit and supplies Kit Dispense based on patient and insurance  preference. Use up to four times daily as directed. (FOR ICD-9 250.00, 250.01).   clopidogrel 75 MG tablet Commonly known as: PLAVIX TAKE ONE TABLET BY MOUTH EVERY DAY   diphenhydrAMINE 50 MG tablet Commonly known as: BENADRYL Take 50 mg by mouth 2 (two) times daily.   EPINEPHrine 0.3 mg/0.3 mL Soaj injection Commonly known as:  EPI-PEN Inject 0.3 mg into the muscle as needed for anaphylaxis.   isosorbide mononitrate 60 MG 24 hr tablet Commonly known as: IMDUR Take 1 tablet (60 mg total) by mouth in the morning and at bedtime.   meclizine 25 MG tablet Commonly known as: ANTIVERT Take 1 tablet (25 mg total) by mouth 3 (three) times daily as needed for dizziness.   metFORMIN 1000 MG tablet Commonly known as: GLUCOPHAGE Take 0.5 tablets (500 mg total) by mouth 2 (two) times daily with a meal.   metoprolol tartrate 25 MG tablet Commonly known as: LOPRESSOR TAKE ONE TABLET BY MOUTH 2 TIMES A DAY   nitroGLYCERIN 0.4 MG SL tablet Commonly known as: NITROSTAT 1 TABLET UNDER TONGUE AS NEEDED FOR CHEST PAIN EVERY 5 MINUTES FOR MAX OF 3 DOSES IN 15 MINUTES; IF NO RELIEF AFTER 1ST DOSE CALL 911   pantoprazole 40 MG tablet Commonly known as: PROTONIX TAKE ONE TABLET BY MOUTH EVERY DAY   potassium chloride SA 20 MEQ tablet Commonly known as: KLOR-CON Take 2 tablets (40 mEq total) by mouth daily. What changed: how much to take   simvastatin 20 MG tablet Commonly known as: ZOCOR Take 1 tablet (20 mg total) by mouth daily.   spironolactone 25 MG tablet Commonly known as: ALDACTONE Take 0.5 tablets (12.5 mg total) by mouth daily. Start taking on: June 15, 2020   torsemide 20 MG tablet Commonly known as: DEMADEX Take 3 tablets (60 mg total) by mouth daily.       Follow-up Information    [facility] up on 06/21/2020.   Specialty: Cardiology Why: at 9:30am.  Contact information: [place] [id number] [phone]             Allergies  Allergen Reactions   Aspirin Anaphylaxis   Celebrex [Celecoxib] Anaphylaxis and Hives   Fentanyl Anaphylaxis   Latex Hives   Lisinopril Shortness Of Breath   Morphine Anaphylaxis   Percocet [Oxycodone-Acetaminophen] Hives and Shortness Of Breath   Simvastatin-High  Dose Hives and Shortness Of Breath    Can tolerate 20 mg dose.    Skelaxin [Metaxalone] Anaphylaxis   Pork-Derived Products Hives   Vicodin [Hydrocodone-Acetaminophen] Hives and Other (See Comments)    Feels like her tongue swells   Albuterol Palpitations    Consultations:  Cardiology   Procedures/Studies: DG Chest Portable 1 View  Result Date: 06/12/2020 CLINICAL DATA:  Lower extremity edema now with shortness of breath and chest pain radiating left arm and jaw EXAM: PORTABLE CHEST 1 VIEW COMPARISON:  Radiograph 04/09/2016 FINDINGS: Increasing pulmonary vascular congestion with diffuse hazy and coarse reticular opacities throughout the lungs in a mid to lower lung and perihilar predominance. Cardiac silhouette appears enlarged from prior portable radiography, could reflect cardiomegaly or developing pericardial effusion. The aorta is calcified. The remaining cardiomediastinal contours are unremarkable. Suspect at least small right pleural effusion. No discernible left effusion is seen. No pneumothorax. No acute osseous or soft tissue abnormality. Degenerative changes are present in the imaged spine and shoulders. Telemetry leads overlie the chest. IMPRESSION: 1. Findings most compatible  with CHF/volume overload with vascular congestion, likely edema and small right effusion. 2. Enlarged cardiac silhouette compared to prior portable radiography. Could reflect some developing cardiomegaly or pericardial effusion. Electronically Signed   By: Lovena Le M.D.   On: 06/12/2020 19:29   ECHOCARDIOGRAM COMPLETE  Result Date: 06/14/2020    ECHOCARDIOGRAM REPORT   Patient Name:   Kathy [name] Date of Exam: 06/14/2020 Medical Rec #:  [id number]       Height:       67.0 in Accession #:    [id number]      Weight:       315.4 lb Date of Birth:  Nov 06, 1965       BSA:          2.454 m Patient Age:    43 years        BP:           109/76 mmHg Patient Gender: F               HR:           85 bpm. Exam  Location:  [facility] Procedure: 2D Echo, Cardiac Doppler, Color Doppler and Strain Analysis Indications:     CHF-acute diastolic J09.32  History:         Patient has prior history of Echocardiogram examinations, most                  recent 01/06/2020. Previous Myocardial Infarction; Risk                  Factors:Hypertension. LBBB.  Sonographer:     Sherrie Sport RDCS (AE) Referring Phys:  [id number] Barb Merino Diagnosing Phys: [name] MD  Sonographer Comments: Global longitudinal strain was attempted. IMPRESSIONS  1. Left ventricular ejection fraction, by estimation, is 50 to 55%. The left ventricle has low normal function. Left ventricular endocardial border not optimally defined to evaluate regional wall motion. There is mild left ventricular hypertrophy. Left ventricular diastolic parameters are indeterminate.  2. Right ventricular systolic function is normal. The right ventricular size is normal. Tricuspid regurgitation signal is inadequate for assessing PA pressure.  3. Left atrial size was mildly dilated.  4. The mitral valve is normal in structure. Trivial mitral valve regurgitation. No evidence of mitral stenosis.  5. The aortic valve is normal in structure. Aortic valve regurgitation is not visualized. Mild aortic valve sclerosis is present, with no evidence of aortic valve stenosis.  6. Challenging image quality. FINDINGS  Left Ventricle: Left ventricular ejection fraction, by estimation, is 50 to 55%. The left ventricle has low normal function. Left ventricular endocardial border not optimally defined to evaluate regional wall motion. The left ventricular internal cavity  size was normal in size. There is mild left ventricular hypertrophy. Left ventricular diastolic parameters are indeterminate. Right Ventricle: The right ventricular size is normal. No increase in right ventricular wall thickness. Right ventricular systolic function is normal. Tricuspid regurgitation signal is inadequate for assessing PA  pressure. Left Atrium: Left atrial size was mildly dilated. Right Atrium: Right atrial size was normal in size. Pericardium: There is no evidence of pericardial effusion. Mitral Valve: The mitral valve is normal in structure. Trivial mitral valve regurgitation. No evidence of mitral valve stenosis. Tricuspid Valve: The tricuspid valve is normal in structure. Tricuspid valve regurgitation is not demonstrated. No evidence of tricuspid stenosis. Aortic Valve: The aortic valve is normal in structure. Aortic valve regurgitation is not visualized. Mild aortic valve sclerosis is present, with  no evidence of aortic valve stenosis. Aortic valve mean gradient measures 5.3 mmHg. Aortic valve peak gradient measures 10.2 mmHg. Aortic valve area, by VTI measures 1.73 cm. Pulmonic Valve: The pulmonic valve was normal in structure. Pulmonic valve regurgitation is not visualized. No evidence of pulmonic stenosis. Aorta: The aortic root is normal in size and structure. Venous: The inferior vena cava was not well visualized. IAS/Shunts: No atrial level shunt detected by color flow Doppler.  LEFT VENTRICLE PLAX 2D LVIDd:         4.54 cm  Diastology LVIDs:         3.31 cm  LV e' medial:    5.33 cm/s LV PW:         1.32 cm  LV E/e' medial:  16.0 LV IVS:        1.03 cm  LV e' lateral:   5.77 cm/s LVOT diam:     2.00 cm  LV E/e' lateral: 14.8 LV SV:         41 LV SV Index:   17 LVOT Area:     3.14 cm                          3D Volume EF:                         3D EF:        68 %                         LV EDV:       88 ml                         LV ESV:       28 ml                         LV SV:        60 ml RIGHT VENTRICLE RV Basal diam:  4.20 cm RV S prime:     14.60 cm/s TAPSE (M-mode): 3.9 cm LEFT ATRIUM           Index       RIGHT ATRIUM           Index LA diam:      3.50 cm 1.43 cm/m  RA Area:     12.40 cm LA Vol (A2C): 97.3 ml 39.65 ml/m RA Volume:   27.80 ml  11.33 ml/m LA Vol (A4C): 29.5 ml 12.02 ml/m  AORTIC VALVE                     PULMONIC VALVE AV Area (Vmax):    1.61 cm     PV Vmax:        0.98 m/s AV Area (Vmean):   1.57 cm     PV Peak grad:   3.8 mmHg AV Area (VTI):     1.73 cm     RVOT Peak grad: 5 mmHg AV Vmax:           160.00 cm/s AV Vmean:          103.667 cm/s AV VTI:            0.236 m AV Peak Grad:      10.2 mmHg AV Mean Grad:      5.3 mmHg LVOT Vmax:  82.20 cm/s LVOT Vmean:        51.700 cm/s LVOT VTI:          0.130 m LVOT/AV VTI ratio: 0.55  AORTA Ao Root diam: 2.70 cm MITRAL VALVE                TRICUSPID VALVE MV Area (PHT): 4.12 cm     TR Peak grad:   22.1 mmHg MV Decel Time: 184 msec     TR Vmax:        235.00 cm/s MV E velocity: 85.30 cm/s MV A velocity: 104.00 cm/s  SHUNTS MV E/A ratio:  0.82         Systemic VTI:  0.13 m                             Systemic Diam: 2.00 cm [name] MD Electronically signed by [name] MD Signature Date/Time: 06/14/2020/2:13:22 PM    Final    (Echo, Carotid, EGD, Colonoscopy, ERCP)    Subjective: Patient seen and examined.  No overnight events.  Most of her groin pain and leg swelling has improved.  She still has some belly distention but feels much better.  She was able to walk around with some shortness of breath but no limitation of activities.   Discharge Exam: Vitals:   06/14/20 0727 06/14/20 1132  BP: (!) 104/44 109/76  Pulse: 65 85  Resp: 19 17  Temp: 98.2 F (36.8 C) 98.2 F (36.8 C)  SpO2: 93% 96%   Vitals:   06/14/20 0500 06/14/20 0600 06/14/20 0727 06/14/20 1132  BP:  (!) 94/43 (!) 104/44 109/76  Pulse:  73 65 85  Resp:  _0 Temp:  98 F (36.7 C) 98.2 F (36.8 C) 98.2 F (36.8 C)  TempSrc:      SpO2:  94% 93% 96%  Weight: (!) 143.1 kg     Height:        General: Pt is alert, awake, not in acute distress Cardiovascular: RRR, S1/S2 +, no rubs, no gallops Respiratory: Fine basal crackles mostly in the posterior lung base. Abdominal: Soft, NT, ND, bowel sounds +, obese and pendulous. Extremities: Trace  bilateral edema.  No cyanosis.    The results of significant diagnostics from this hospitalization (including imaging, microbiology, ancillary and laboratory) are listed below for reference.     Microbiology: Recent Results (from the past 240 hour(s))  Resp Panel by RT-PCR (Flu A&B, Covid) Nasopharyngeal Swab     Status: None   Collection Time: 06/12/20  7:11 PM   Specimen: Nasopharyngeal Swab; Nasopharyngeal(NP) swabs in vial transport medium  Result Value Ref Range Status   SARS Coronavirus 2 by RT PCR NEGATIVE NEGATIVE Final    Comment: (NOTE) SARS-CoV-2 target nucleic acids are NOT DETECTED.  The SARS-CoV-2 RNA is generally detectable in upper respiratory specimens during the acute phase of infection. The lowest concentration of SARS-CoV-2 viral copies this assay can detect is 138 copies/mL. A negative result does not preclude SARS-Cov-2 infection and should not be used as the sole basis for treatment or other patient management decisions. A negative result may occur with  improper specimen collection/handling, submission of specimen other than nasopharyngeal swab, presence of viral mutation(s) within the areas targeted by this assay, and inadequate number of viral copies(<138 copies/mL). A negative result must be combined with clinical observations, patient history, and epidemiological information. The expected result is Negative.  Fact Sheet  for Patients:  EntrepreneurPulse.com.au  Fact Sheet for Healthcare Providers:  IncredibleEmployment.be  This test is no t yet approved or cleared by the [place] FDA and  has been authorized for detection and/or diagnosis of SARS-CoV-2 by FDA under an Emergency Use Authorization (EUA). This EUA will remain  in effect (meaning this test can be used) for the duration of the COVID-19 declaration under Section 564(b)(1) of the Act, 21 U.S.C.section 360bbb-3(b)(1), unless the authorization is  terminated  or revoked sooner.       Influenza A by PCR NEGATIVE NEGATIVE Final   Influenza B by PCR NEGATIVE NEGATIVE Final    Comment: (NOTE) The Xpert Xpress SARS-CoV-2/FLU/RSV plus assay is intended as an aid in the diagnosis of influenza from Nasopharyngeal swab specimens and should not be used as a sole basis for treatment. Nasal washings and aspirates are unacceptable for Xpert Xpress SARS-CoV-2/FLU/RSV testing.  Fact Sheet for Patients: EntrepreneurPulse.com.au  Fact Sheet for Healthcare Providers: IncredibleEmployment.be  This test is not yet approved or cleared by the [place] FDA and has been authorized for detection and/or diagnosis of SARS-CoV-2 by FDA under an Emergency Use Authorization (EUA). This EUA will remain in effect (meaning this test can be used) for the duration of the COVID-19 declaration under Section 564(b)(1) of the Act, 21 U.S.C. section 360bbb-3(b)(1), unless the authorization is terminated or revoked.  Performed at [facility], [address] [place], [place] [address]      Labs: BNP (last 3 results) Recent Labs    06/12/20 1903  BNP 37.8   Basic Metabolic Panel: Recent Labs  Lab 06/12/20 1911 06/13/20 0517 06/14/20 0433  NA 139 140 138  K 3.3* 3.2* 3.8  CL 99 101 101  CO2 _0 GLUCOSE 172* 124* 126*  BUN _1 CREATININE 0.63 0.67 0.65  CALCIUM 9.0 8.9 9.1  MG  --   --  2.0  PHOS  --   --  3.6   Liver Function Tests: Recent Labs  Lab 06/12/20 1911  AST 78*  ALT 81*  ALKPHOS 107  BILITOT 0.7  PROT 7.4  ALBUMIN 3.8   No results for input(s): LIPASE, AMYLASE in the last 168 hours. No results for input(s): AMMONIA in the last 168 hours. CBC: Recent Labs  Lab 06/12/20 1911  WBC 7.6  HGB 12.2  HCT 38.0  MCV 86.2  PLT 212   Cardiac Enzymes: No results for input(s): CKTOTAL, CKMB, CKMBINDEX, TROPONINI in the last 168 hours. BNP: Invalid input(s):  POCBNP CBG: Recent Labs  Lab 06/13/20 1133 06/13/20 1637 06/13/20 2029 06/14/20 0727 06/14/20 1131  GLUCAP 127* 119* 155* 129* 157*   D-Dimer No results for input(s): DDIMER in the last 72 hours. Hgb A1c No results for input(s): HGBA1C in the last 72 hours. Lipid Profile No results for input(s): CHOL, HDL, LDLCALC, TRIG, CHOLHDL, LDLDIRECT in the last 72 hours. Thyroid function studies No results for input(s): TSH, T4TOTAL, T3FREE, THYROIDAB in the last 72 hours.  Invalid input(s): FREET3 Anemia work up No results for input(s): VITAMINB12, FOLATE, FERRITIN, TIBC, IRON, RETICCTPCT in the last 72 hours. Urinalysis    Component Value Date/Time   COLORURINE Yellow 02/28/2013 1846   APPEARANCEUR Clear 02/28/2013 1846   LABSPEC 1.016 02/28/2013 1846   PHURINE 6.0 02/28/2013 1846   GLUCOSEU Negative 02/28/2013 1846   HGBUR Negative 02/28/2013 1846   BILIRUBINUR Negative 02/28/2013 1846   KETONESUR Negative 02/28/2013 1846   PROTEINUR Negative 02/28/2013 1846  NITRITE Negative 02/28/2013 1846   LEUKOCYTESUR Negative 02/28/2013 1846   Sepsis Labs Invalid input(s): PROCALCITONIN,  WBC,  LACTICIDVEN Microbiology Recent Results (from the past 240 hour(s))  Resp Panel by RT-PCR (Flu A&B, Covid) Nasopharyngeal Swab     Status: None   Collection Time: 06/12/20  7:11 PM   Specimen: Nasopharyngeal Swab; Nasopharyngeal(NP) swabs in vial transport medium  Result Value Ref Range Status   SARS Coronavirus 2 by RT PCR NEGATIVE NEGATIVE Final    Comment: (NOTE) SARS-CoV-2 target nucleic acids are NOT DETECTED.  The SARS-CoV-2 RNA is generally detectable in upper respiratory specimens during the acute phase of infection. The lowest concentration of SARS-CoV-2 viral copies this assay can detect is 138 copies/mL. A negative result does not preclude SARS-Cov-2 infection and should not be used as the sole basis for treatment or other patient management decisions. A negative result may  occur with  improper specimen collection/handling, submission of specimen other than nasopharyngeal swab, presence of viral mutation(s) within the areas targeted by this assay, and inadequate number of viral copies(<138 copies/mL). A negative result must be combined with clinical observations, patient history, and epidemiological information. The expected result is Negative.  Fact Sheet for Patients:  EntrepreneurPulse.com.au  Fact Sheet for Healthcare Providers:  IncredibleEmployment.be  This test is no t yet approved or cleared by the [place] FDA and  has been authorized for detection and/or diagnosis of SARS-CoV-2 by FDA under an Emergency Use Authorization (EUA). This EUA will remain  in effect (meaning this test can be used) for the duration of the COVID-19 declaration under Section 564(b)(1) of the Act, 21 U.S.C.section 360bbb-3(b)(1), unless the authorization is terminated  or revoked sooner.       Influenza A by PCR NEGATIVE NEGATIVE Final   Influenza B by PCR NEGATIVE NEGATIVE Final    Comment: (NOTE) The Xpert Xpress SARS-CoV-2/FLU/RSV plus assay is intended as an aid in the diagnosis of influenza from Nasopharyngeal swab specimens and should not be used as a sole basis for treatment. Nasal washings and aspirates are unacceptable for Xpert Xpress SARS-CoV-2/FLU/RSV testing.  Fact Sheet for Patients: EntrepreneurPulse.com.au  Fact Sheet for Healthcare Providers: IncredibleEmployment.be  This test is not yet approved or cleared by the [place] FDA and has been authorized for detection and/or diagnosis of SARS-CoV-2 by FDA under an Emergency Use Authorization (EUA). This EUA will remain in effect (meaning this test can be used) for the duration of the COVID-19 declaration under Section 564(b)(1) of the Act, 21 U.S.C. section 360bbb-3(b)(1), unless the authorization is terminated  or revoked.  Performed at [facility], [address] [place], [place] [address]      Time coordinating discharge:  32 minutes  SIGNED:   Barb Merino, MD  [facility] 06/14/2020, 3:50 PM

## 2020-06-14 NOTE — Telephone Encounter (Signed)
-----   Message from Dodge, PA-C sent at 06/14/2020  4:41 PM EST ----- Regarding: hospital f/u Please call patient and arrange for hospital follow-up within 2-3 weeks with APP or MD for CHF/volume status follow-up.

## 2020-06-14 NOTE — Evaluation (Signed)
Occupational Therapy Evaluation Patient Details Name: Kathy [name] MRN: [id number] DOB: 1965-11-02 Today's Date: 06/14/2020    History of Present Illness Lamanda Rudder is a 79yoF who comes to [facility] with some trouble breathing.   Clinical Impression   Pt reports living at home with significant other who is also on disability. Pt reports, " We help each other with things". Pt reports being independent at home without use of AD. Pt also endorses possible L rotator cuff tear she is seeking treatment for but has made modifications for self care tasks and remains independent. Pt demonstrates ambulation, toileting, and balance with hygiene while standing without LOB or assistance. OT did review energy conservation techniques for self care tasks and community mobility for discharge with pt verbalizing understanding. Pt does not have skilled acute OT needs at this time. OT to SIGN OFF. Thank you for this referral.     Follow Up Recommendations  No OT follow up    Equipment Recommendations  None recommended by OT       Precautions / Restrictions Precautions Precautions: None      Mobility Bed Mobility Overal bed mobility: Independent                  Transfers Overall transfer level: Independent                    Balance Overall balance assessment: Independent                                         ADL either performed or assessed with clinical judgement   ADL Overall ADL's : Modified independent;At baseline                                             Vision Baseline Vision/History: No visual deficits Patient Visual Report: No change from baseline Vision Assessment?: No apparent visual deficits            Pertinent Vitals/Pain Pain Assessment: No/denies pain     Hand Dominance Right   Extremity/Trunk Assessment Upper Extremity Assessment Upper Extremity Assessment: Overall WFL for tasks assessed;LUE  deficits/detail LUE Deficits / Details: limited shoulder elevation secondary to possible rotator cuff tear.   Lower Extremity Assessment Lower Extremity Assessment: Overall WFL for tasks assessed       Communication Communication Communication: No difficulties   Cognition Arousal/Alertness: Awake/alert Behavior During Therapy: WFL for tasks assessed/performed Overall Cognitive Status: Within Functional Limits for tasks assessed                                                Home Living Family/patient expects to be discharged to:: Private residence Living Arrangements: Spouse/significant other Available Help at Discharge: Family Type of Home: Mobile home Home Access: Ramped entrance     Home Layout: One level     Bathroom Shower/Tub: Tub/shower unit         Home Equipment: Grab bars - tub/shower          Prior Functioning/Environment Level of Independence: Independent    ADL's / Homemaking Assistance Needed: Pt has had difficulty with dressing x1 year due left  shoulder pain, [facility] suspect Left rotator cuff. Requires increased time and modifications to tasks. Communication / Swallowing Assistance Needed: does not work, is disabled. Comments: Pt still driving, somewhat limited with community distances; no equipment used at baseline.         AM-PAC OT "6 Clicks" Daily Activity     Outcome Measure Help from another person eating meals?: None Help from another person taking care of personal grooming?: None Help from another person toileting, which includes using toliet, bedpan, or urinal?: None Help from another person bathing (including washing, rinsing, drying)?: None Help from another person to put on and taking off regular upper body clothing?: None Help from another person to put on and taking off regular lower body clothing?: None 6 Click Score: 24   End of Session    Activity Tolerance: Patient tolerated  treatment well Patient left: in bed;with call bell/phone within reach                   Time: 8472-0721 OT Time Calculation (min): 21 min Charges:  OT General Charges $OT Visit: 1 Visit OT Evaluation $OT Eval Low Complexity: 1 Low OT Treatments $Self Care/Home Management : 8-22 mins  Darleen Crocker, MS, OTR/L , CBIS ascom [phone]  06/14/20, 1:25 PM

## 2020-06-14 NOTE — Telephone Encounter (Signed)
To scheduling pool to arrange follow up.

## 2020-06-14 NOTE — Consult Note (Signed)
   Heart Failure Nurse Navigator Note  HFpEF 55-60%.  Mild mitral regurgitation.  She presented to the emergency room by way of EMS due to complaints of increasing shortness of breath and orthopnea.   Comorbidities:  Coronary artery disease Hypertension Diabetes Obesity    History of chronic left bundle branch block.  She also has listed an allergy to lisinopril is not on ACE, ARB or Arni due to this reason.   Medications:   Plavix 75 mg daily Furosemide 40 mg 2 times a day Isosorbide mononitrate 60 mg daily Metoprolol tartrate 25 mg 2 times a day Potassium chloride 40 mEq 2 times a day Simvastatin 20 mg daily Spironolactone 12-1/2 mg daily   Labs:  Sodium 138, potassium 3.8, chloride 101, CO2 27, BUN 14, creatinine 0.65 Weight 143.1 kg yesterday 144.8 kg Intake 120 mL Output 3100 mL   Assessment:  General-she is awake and alert sitting upright in bed, states that her breathing has improved and does not have as much abdominal swelling nor lower extremity edema.  HEENT-pulse are equal, normocephalic has a pierced tongue.  No JVD  Cardiac-heart tones are of regular rate and rhythm no murmurs or rubs appreciated.  Chest-breath sounds are clear to posterior auscultation.  Abdomen-rounded soft nontender.  Musculoskeletal-there is no lower extremity edema noted, pulses are palpable.  Psych-she is pleasant and appropriate, makes good eye contact.  Neurologic-speech is clear, moves all extremities without difficulty.     Initial meeting with patient.  She admits to not weighing herself daily.  Also had been out to eat on Friday evening and  Ate shrimp which she said tasted very salty.  When she woke up on Saturday morning she felt more abdominal distention and lower extremity edema.  Later on Saturday she had tried to lay down to sleep and was woken up with having to sit up to breathe.  Stressed the importance of being more cognizant of the foods that she is  eating and the sodium that they contain.  Discussed reading all labels, and when she is eating out asked servers if they can give her an idea of how much sodium is contained in what she wants to order.  Also discussed the importance of limiting her fluid intake, she had been drinking as much as 20-20 ounce bottles in a days time.  Discussed sucking on ice instead of drinking so much water, she states that the coldness of the ice she has problems with her teeth.  She was given zone magnet, and discussed again the various areas and the importance of daily weights, and reporting weight gains along with increased of symptoms.  She voices understanding.  She has follow-up appointment with Darylene Price in the heart failure clinic on March 14.    Pricilla Riffle RN [facility]

## 2020-06-14 NOTE — Progress Notes (Signed)
*  PRELIMINARY RESULTS* Echocardiogram 2D Echocardiogram has been performed.  Kathy [name] 06/14/2020, 1:25 PM

## 2020-06-15 ENCOUNTER — Encounter: Payer: Self-pay | Admitting: Gerontology

## 2020-06-15 ENCOUNTER — Other Ambulatory Visit: Payer: Self-pay | Admitting: Gerontology

## 2020-06-15 ENCOUNTER — Ambulatory Visit: Payer: Self-pay | Admitting: Gerontology

## 2020-06-15 ENCOUNTER — Other Ambulatory Visit: Payer: Self-pay

## 2020-06-15 VITALS — BP 118/80 | HR 86 | Temp 97.0°F | Resp 16 | Wt 313.2 lb

## 2020-06-15 DIAGNOSIS — E119 Type 2 diabetes mellitus without complications: Secondary | ICD-10-CM

## 2020-06-15 DIAGNOSIS — Z09 Encounter for follow-up examination after completed treatment for conditions other than malignant neoplasm: Secondary | ICD-10-CM | POA: Insufficient documentation

## 2020-06-15 MED ORDER — METFORMIN HCL 1000 MG PO TABS: 1000.0000 mg | ORAL_TABLET | Freq: Two times a day (BID) | ORAL | 2 refills | Status: DC

## 2020-06-15 NOTE — Progress Notes (Signed)
Established Patient Office Visit  Subjective:  Patient ID: Kathy [name], female    DOB: [birth year]  Age: 55 y.o. MRN: [id number]  CC: No chief complaint on file.   HPI [place] 55 y/o female who has history of diastolic heart failure with known ejection fraction 55%, coronary artery disease and chronic left bundle branch block, hypertension, type 2 diabetes and morbid obesity presents for follow up after hospital discharge. She was discharge on 06/14/20  after been treated for Acute or chronic diastolic heart failure. She had Echo done yesterday and it showed Left Ventricle: Left ventricular ejection fraction, by estimation, is 50 to 55%. The left ventricle has low normal function. Left ventricular endocardial border not optimally defined to evaluate regional wall motion. The left ventricular internal cavity size was normal in size. There is mild left ventricular hypertrophy. Left ventricular diastolic parameters are indeterminate. Right Ventricle: The right ventricular size is normal. No increase in right ventricular wall thickness. Right ventricular systolic function is normal. Tricuspid regurgitation signal is inadequate for assessing PA. Currently, she denies lower extremity edema, abdominal edema, chest pain and shortness of breath. She states that she adheres to her discharge instructions, and continues to work on making healthy lifestyle changes. Her HgbA1c done on 06/09/20 increased from 6.9% to 7%. She checks her fasting blood glucose dailhy and it was 123 mg/dl. She denies hypo/hyperglycemic symptoms and performs daily foot checks. Overall, she states that she's doing well and offers no further complaint.  Past Medical History:  Diagnosis Date  . (HFimpEF) heart failure with improved ejection fraction ([place])    a. 02/2016 Echo: EF 45%; b. 03/2016 Echo: EF 55%; c. 12/2019 Echo: EF 55-60%, no rwma, gr2 DD, nl RV size/fxn, mildly dil RA, mild MR.  . Asthma   . Chronic chest pain    a.  ? vasospasm vs endothelial dysfxn. Improved w/ Ranexa but couldn't afford.  . 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 Presumed in setting of prior h/o MI w/ insignificant coronary dzs.  . Diabetes mellitus without complication ([place])   . Heart murmur   . History of NICM (nonischemic cardiomyopathy) ([place])    a. 02/2016 Echo: EF 45%; b. 12/2019 Echo: EF 55-60%.  . Hyperlipidemia   . Hypertension   . Myocardial infarction ([employer])   . Non-obstructive Coronary artery disease    a. Prior caths in 2007, 2011, 2012 - nonobs dzs; b. 03/2016 Cath ([facility]): LM nl, LAD/LCX/RCA insignificant dzs.  . Obesity   . Rate-dependent LBBB (left bundle branch block)   . Remote Tobacco abuse    a. 30+ pack year history - quit 2019.  . Seizures ([place])     Past Surgical History:  Procedure Laterality Date  . ANTERIOR CRUCIATE LIGAMENT REPAIR    . BACK SURGERY     L4 and L5  . CARDIAC CATHETERIZATION  04/09/2016  . COLONOSCOPY    . COLONOSCOPY WITH PROPOFOL N/A 08/04/2019   Procedure: COLONOSCOPY WITH PROPOFOL;  Surgeon: Lin Landsman, MD;  [facility]COPY;  Service: Gastroenterology;  Laterality: N/A;  . EMPYEMA DRAINAGE     at age 83  . TONSILLECTOMY    . TONSILLECTOMY AND ADENOIDECTOMY  2008  . TUBAL LIGATION      Family History  Problem Relation Age of Onset  . Kidney disease Sister   . Diabetes Sister   . COPD Sister   . Stroke Sister   . Diabetes Mother   . Stroke Mother   .  Heart attack Mother   . Stroke Father   . Heart attack Father   . Asthma Daughter   . Asthma Son     Social History   Socioeconomic History  . Marital status: Divorced    Spouse name: Not on file  . Number of children: 2  . Years of education: pre-requisites   . Highest education level: GED or equivalent  Occupational History  . Occupation: unemployed  Tobacco Use  . Smoking status: Former Smoker    Packs/day: 1.00    Years: 30.00    Pack years: 30.00    Types: Cigarettes    Quit date: 08/16/2017     Years since quitting: 2.8  . Smokeless tobacco: Never Used  . Tobacco comment: Decreased smoking to less than 5 cigs/day  Vaping Use  . Vaping Use: Never used  Substance and Sexual Activity  . Alcohol use: No  . Drug use: Not Currently    Comment: Previously used cocaine in teh 1990s  . Sexual activity: Yes    Birth control/protection: Condom  Other Topics Concern  . Not on file  Social History Narrative   Currently on food stamps. They help somewhat but still difficult to buy food that she needs. Boyfriend helps pay rent with disability, they live together. Roof is leaking really badly. Lives in modular home. Social services bought tarp which helped, but it deteriorated from heat.    Social Determinants of Health   Financial Resource Strain: Not on file  Food Insecurity: Not on file  Transportation Needs: Not on file  Physical Activity: Not on file  Stress: Not on file  Social Connections: Not on file  Intimate Partner Violence: Not on file    Outpatient Medications Prior to Visit  Medication Sig Dispense Refill  . clopidogrel (PLAVIX) 75 MG tablet TAKE ONE TABLET BY MOUTH EVERY DAY 90 tablet 0  . diphenhydrAMINE (BENADRYL) 50 MG tablet Take 50 mg by mouth 2 (two) times daily.    Marland Kitchen EPINEPHrine 0.3 mg/0.3 mL IJ SOAJ injection Inject 0.3 mg into the muscle as needed for anaphylaxis. 1 each 0  . isosorbide mononitrate (IMDUR) 60 MG 24 hr tablet Take 1 tablet (60 mg total) by mouth in the morning and at bedtime. 60 tablet 2  . meclizine (ANTIVERT) 25 MG tablet Take 1 tablet (25 mg total) by mouth 3 (three) times daily as needed for dizziness. 90 tablet 6  . metFORMIN (GLUCOPHAGE) 1000 MG tablet Take 0.5 tablets (500 mg total) by mouth 2 (two) times daily with a meal. 60 tablet 2  . metoprolol tartrate (LOPRESSOR) 25 MG tablet TAKE ONE TABLET BY MOUTH 2 TIMES A DAY 120 tablet 0  . nitroGLYCERIN (NITROSTAT) 0.4 MG SL tablet 1 TABLET UNDER TONGUE AS NEEDED FOR CHEST PAIN EVERY 5  MINUTES FOR MAX OF 3 DOSES IN 15 MINUTES; IF NO RELIEF AFTER 1ST DOSE CALL 911 25 tablet 0  . pantoprazole (PROTONIX) 40 MG tablet TAKE ONE TABLET BY MOUTH EVERY DAY 90 tablet 0  . simvastatin (ZOCOR) 20 MG tablet Take 1 tablet (20 mg total) by mouth daily. 90 tablet 0  . spironolactone (ALDACTONE) 25 MG tablet Take 0.5 tablets (12.5 mg total) by mouth daily. 15 tablet 2  . torsemide (DEMADEX) 20 MG tablet Take 3 tablets (60 mg total) by mouth daily. 270 tablet 3  . blood glucose meter kit and supplies KIT Dispense based on patient and insurance preference. Use up to four times daily as directed. (FOR  ICD-9 250.00, 250.01). 1 each 0  . potassium chloride SA (KLOR-CON) 20 MEQ tablet Take 2 tablets (40 mEq total) by mouth daily. (Patient taking differently: Take 20 mEq by mouth daily.) 180 tablet 3   No facility-administered medications prior to visit.    Allergies  Allergen Reactions  . Aspirin Anaphylaxis  . Celebrex [Celecoxib] Anaphylaxis and Hives  . Fentanyl Anaphylaxis  . Latex Hives  . Lisinopril Shortness Of Breath  . Morphine Anaphylaxis  . Percocet [Oxycodone-Acetaminophen] Hives and Shortness Of Breath  . Simvastatin-High Dose Hives and Shortness Of Breath    Can tolerate 20 mg dose.   Jonah Blue [Metaxalone] Anaphylaxis  . Pork-Derived Metallurgist  . Vicodin [Hydrocodone-Acetaminophen] Hives and Other (See Comments)    Feels like her tongue swells  . Albuterol Palpitations    ROS Review of Systems  Constitutional: Negative.   Respiratory: Negative.   Cardiovascular: Negative.   Gastrointestinal: Negative.   Neurological: Negative.   Psychiatric/Behavioral: Negative.       Objective:    Physical Exam HENT:     Head: Normocephalic and atraumatic.  Cardiovascular:     Rate and Rhythm: Normal rate and regular rhythm.     Pulses: Normal pulses.     Heart sounds: Normal heart sounds.  Pulmonary:     Effort: Pulmonary effort is normal.     Breath sounds:  Normal breath sounds.  Abdominal:     General: Bowel sounds are normal. There is no distension.     Palpations: Abdomen is soft.     Tenderness: There is no abdominal tenderness.  Musculoskeletal:     Right lower leg: No edema.     Left lower leg: No edema.  Neurological:     General: No focal deficit present.     Mental Status: She is alert and oriented to person, place, and time. Mental status is at baseline.  Psychiatric:        Mood and Affect: Mood normal.        Thought Content: Thought content normal.        Judgment: Judgment normal.     BP 118/80 (BP Location: Left Arm, Patient Position: Sitting, Cuff Size: Large)   Pulse 86   Temp (!) 97 F (36.1 C)   Resp 16   Wt (!) 313 lb 3.2 oz (142.1 kg)   LMP 06/09/2018   SpO2 95%   BMI 49.05 kg/m  Wt Readings from Last 3 Encounters:  06/15/20 (!) 313 lb 3.2 oz (142.1 kg)  06/14/20 (!) 315 lb 6.4 oz (143.1 kg)  06/09/20 (!) 320 lb 9.6 oz (145.4 kg)   Encouraged weight loss  Health Maintenance Due  Topic Date Due  . Hepatitis C Screening  Never done  . COVID-19 Vaccine (1) Never done  . TETANUS/TDAP  Never done  . PAP SMEAR-Modifier  Never done  . MAMMOGRAM  Never done  . FOOT EXAM  06/11/2020    There are no preventive care reminders to display for this patient.  Lab Results  Component Value Date   TSH 1.770 10/04/2017   Lab Results  Component Value Date   WBC 7.6 06/12/2020   HGB 12.2 06/12/2020   HCT 38.0 06/12/2020   MCV 86.2 06/12/2020   PLT 212 06/12/2020   Lab Results  Component Value Date   NA 138 06/14/2020   K 3.8 06/14/2020   CO2 27 06/14/2020   GLUCOSE 126 (H) 06/14/2020   BUN 14 06/14/2020   CREATININE 0.65  06/14/2020   BILITOT 0.7 06/12/2020   ALKPHOS 107 06/12/2020   AST 78 (H) 06/12/2020   ALT 81 (H) 06/12/2020   PROT 7.4 06/12/2020   ALBUMIN 3.8 06/12/2020   CALCIUM 9.1 06/14/2020   ANIONGAP 10 06/14/2020   Lab Results  Component Value Date   CHOL 149 01/22/2019   Lab  Results  Component Value Date   HDL 27 (L) 01/22/2019   Lab Results  Component Value Date   LDLCALC 93 01/22/2019   Lab Results  Component Value Date   TRIG 167 (H) 01/22/2019   Lab Results  Component Value Date   CHOLHDL 5.5 (H) 01/22/2019   Lab Results  Component Value Date   HGBA1C 7.0 (H) 06/09/2020      Assessment & Plan:    1. Type 2 diabetes mellitus without complication, without long-term current use of insulin (HCC) - Her HgbA1c was 7% and her goal should be less than 7%, she was encouraged to continue on her treatment regimen, check her fasting blood glucose daily, record and bring log to follow up appointment. Her fasting goal should be between 80-130 mg/dl. She was encouraged to continue on low carb/non concentrated sweet diet. - HgB A1c; Future  2. Hospital discharge follow-up - She was encouraged to adhere to her discharge instructions and follow up with her appointments at the CHF clinic and Cardiology. She was advised to go to the ED for worsening symptoms.    Follow-up: Return in about 13 weeks (around 09/14/2020), or if symptoms worsen or fail to improve.    Rome Echavarria Jerold Coombe, NP

## 2020-06-15 NOTE — Telephone Encounter (Signed)
Patient contacted regarding discharge from [facility] on 06/14/20.  Patient understands to follow up with provider Laurann Montana NP on 06/28/20 at 3:00 PM at [facility]. Patient understands discharge instructions? Yes Patient understands medications and regiment? Yes Patient understands to bring all medications to this visit? Yes  Patient confirmed upcoming appointment with no further questions at this time.

## 2020-06-15 NOTE — Patient Instructions (Signed)

## 2020-06-15 NOTE — Telephone Encounter (Signed)
Patient scheduled on 06/28/20 at 3 pm with Owens Shark, NP.  1st TCM attempt.  No answer. Left message to call back.

## 2020-06-16 ENCOUNTER — Ambulatory Visit: Payer: Self-pay | Attending: Oncology

## 2020-06-16 ENCOUNTER — Other Ambulatory Visit: Payer: Self-pay | Admitting: Oncology

## 2020-06-16 ENCOUNTER — Ambulatory Visit
Admission: RE | Admit: 2020-06-16 | Discharge: 2020-06-16 | Disposition: A | Discharge status: Home / Self Care | Payer: Self-pay | Source: Ambulatory Visit | Attending: Oncology | Admitting: Oncology

## 2020-06-16 VITALS — BP 129/99 | HR 103 | Temp 98.4°F | Ht 64.96 in | Wt 313.0 lb

## 2020-06-16 DIAGNOSIS — Z0000 Encounter for general adult medical examination without abnormal findings: Secondary | ICD-10-CM

## 2020-06-16 NOTE — Progress Notes (Signed)
  Subjective:     Patient ID: Kathy [name], female   DOB: [birth year], 55 y.o.   MRN: [id number]  HPI   Review of Systems     Objective:   Physical Exam Chest:  Breasts:     Right: No swelling, bleeding, inverted nipple, mass, nipple discharge, skin change or tenderness.     Left: No swelling, bleeding, inverted nipple, mass, nipple discharge, skin change or tenderness.    Genitourinary:    Labia:        Right: No rash, tenderness, lesion or injury.        Left: No rash, tenderness, lesion or injury.      Cervix: No cervical motion tenderness, discharge, friability, lesion, erythema, cervical bleeding or eversion.     Uterus: Not deviated, not enlarged, not fixed, not tender and no uterine prolapse.      Adnexa:        Right: No mass, tenderness or fullness.         Left: No mass, tenderness or fullness.          Assessment:     55 year old patient presents for [facility] clinic visit.  Patient screened, and meets BCCCP eligibility.  Patient does not have insurance, Medicare or Medicaid. Instructed patient on breast self awareness using teach back method.  Clinical breast exam unremarkable.  No mass or lump. Pelvic exam normal. Risk Assessment    Risk Scores      06/16/2020   Last edited by: Rico Junker, RN   5-year risk: 0.9 %   Lifetime risk: 6.7 %            Plan:     Sent for bilateral screening mammogram.  Specimen collected.

## 2020-06-18 NOTE — Progress Notes (Signed)
Patient ID: Kathy [name], female    DOB: May 14, 1965, 55 y.o.   MRN: [id number]  Ms Kathy [name] is a 55 y/o female with a history of asthma, CAD, hyperlipidemia, HTN, seizures, DM, previous tobacco use and chronic heart failure.   Echo report from 06/14/20 reviewed and showed an EF of 50-55% along with mild LVH. Echo report from 01/06/20 reviewed and showed an EF of 55-60% along with mild LVH and mild MR. Echo report from 02/27/17 reviewed and showed an EF of 55-60%.  Admitted 06/12/20 due to acute on chronic HF. Cardiology consult obtained. Initially given IV lasix with transition to oral diuretics. Discharged after 2 days.   She presents today for her follow up visit with a chief complaint of minimal shortness of breath upon moderate exertion. She describes this as chronic in nature having been present for a few years although is better since her recent hospital discharge. She has associated fatigue, difficulty sleeping, chronic pain and continued allergic reactions. She denies any dizziness, abdominal distention, palpitations, pedal edema, chest pain, cough or weight gain.   Now has her epi-pen. Says that while admitted, she had allergic reaction to either the sheets themselves or to the detergent that they were washed in. Skin was red, itchy and started to peel. This has been slowly improving since she's been home.   Was started on spironolactone in the hospital so will get lab work today with possible stoppage of her potassium supplements.   Past Medical History:  Diagnosis Date  . 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 02/2016 Echo: EF 45%; b. 03/2016 Echo: EF 55%; c. 12/2019 Echo: EF 55-60%, no rwma, gr2 DD, nl RV size/fxn, mildly dil RA, mild MR.  . Asthma   . Chronic chest pain    a. ? vasospasm vs endothelial dysfxn. Improved w/ Ranexa but couldn't afford.  . 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 Presumed in setting of prior h/o MI w/ insignificant coronary dzs.  .  Diabetes mellitus without complication ([place])   . Heart murmur   . History of NICM (nonischemic cardiomyopathy) ([place])    a. 02/2016 Echo: EF 45%; b. 12/2019 Echo: EF 55-60%.  . Hyperlipidemia   . Hypertension   . Myocardial infarction ([place])   . Non-obstructive Coronary artery disease    a. Prior caths in 2007, 2011, 2012 - nonobs dzs; b. 03/2016 Cath ([facility]): LM nl, LAD/LCX/RCA insignificant dzs.  . Obesity   . Rate-dependent LBBB (left bundle branch block)   . Remote Tobacco abuse    a. 30+ pack year history - quit 2019.  . Seizures ([place])    Past Surgical History:  Procedure Laterality Date  . ANTERIOR CRUCIATE LIGAMENT REPAIR    . BACK SURGERY     L4 and L5  . CARDIAC CATHETERIZATION  04/09/2016  . COLONOSCOPY    . COLONOSCOPY WITH PROPOFOL N/A 08/04/2019   Procedure: COLONOSCOPY WITH PROPOFOL;  Surgeon: Lin Landsman, MD;  Location: [facility] ENDOSCOPY;  Service: Gastroenterology;  Laterality: N/A;  . EMPYEMA DRAINAGE     at age 74  . TONSILLECTOMY    . TONSILLECTOMY AND ADENOIDECTOMY  2008  . TUBAL LIGATION     Family History  Problem Relation Age of Onset  . Kidney disease Sister   . Diabetes Sister   . COPD Sister   . Stroke Sister   . Diabetes Mother   . Stroke Mother   . Heart attack Mother   .  Stroke Father   . Heart attack Father   . Asthma Daughter   . Asthma Son    Social History   Tobacco Use  . Smoking status: Former Smoker    Packs/day: 1.00    Years: 30.00    Pack years: 30.00    Types: Cigarettes    Quit date: 08/16/2017    Years since quitting: 2.8  . Smokeless tobacco: Never Used  . Tobacco comment: Decreased smoking to less than 5 cigs/day  Substance Use Topics  . Alcohol use: No   Allergies  Allergen Reactions  . Aspirin Anaphylaxis  . Celebrex [Celecoxib] Anaphylaxis and Hives  . Fentanyl Anaphylaxis  . Latex Hives  . Lisinopril Shortness Of Breath  . Morphine Anaphylaxis  . Percocet [Oxycodone-Acetaminophen] Hives and Shortness Of  Breath  . Simvastatin-High Dose Hives and Shortness Of Breath    Can tolerate 20 mg dose.   Jonah Blue [Metaxalone] Anaphylaxis  . Pork-Derived Metallurgist  . Vicodin [Hydrocodone-Acetaminophen] Hives and Other (See Comments)    Feels like her tongue swells  . Albuterol Palpitations   Prior to Admission medications   Medication Sig Start Date End Date Taking? Authorizing Provider  blood glucose meter kit and supplies KIT Dispense based on patient and insurance preference. Use up to four times daily as directed. (FOR ICD-9 250.00, 250.01). 03/02/20  Yes Iloabachie, Chioma E, NP  clopidogrel (PLAVIX) 75 MG tablet TAKE ONE TABLET BY MOUTH EVERY DAY 05/13/20  Yes Iloabachie, Chioma E, NP  diphenhydrAMINE (BENADRYL) 50 MG tablet Take 50 mg by mouth 2 (two) times daily.   Yes [provider]  EPINEPHrine 0.3 mg/0.3 mL IJ SOAJ injection Inject 0.3 mg into the muscle as needed for anaphylaxis. 01/01/20  Yes Iloabachie, Chioma E, NP  isosorbide mononitrate (IMDUR) 60 MG 24 hr tablet Take 1 tablet (60 mg total) by mouth in the morning and at bedtime. 05/13/20  Yes Iloabachie, Chioma E, NP  meclizine (ANTIVERT) 25 MG tablet Take 1 tablet (25 mg total) by mouth 3 (three) times daily as needed for dizziness. 12/16/15  Yes McGowan, Larene Beach A, PA-C  metFORMIN (GLUCOPHAGE) 1000 MG tablet Take 1 tablet (1,000 mg total) by mouth 2 (two) times daily with a meal. 06/15/20  Yes Iloabachie, Chioma E, NP  metoprolol tartrate (LOPRESSOR) 25 MG tablet TAKE ONE TABLET BY MOUTH 2 TIMES A DAY 06/01/20  Yes Iloabachie, Chioma E, NP  nitroGLYCERIN (NITROSTAT) 0.4 MG SL tablet 1 TABLET UNDER TONGUE AS NEEDED FOR CHEST PAIN EVERY 5 MINUTES FOR MAX OF 3 DOSES IN 15 MINUTES; IF NO RELIEF AFTER 1ST DOSE CALL 911 05/13/19  Yes McGowan, Shannon A, PA-C  pantoprazole (PROTONIX) 40 MG tablet TAKE ONE TABLET BY MOUTH EVERY DAY 05/13/20  Yes Iloabachie, Chioma E, NP  potassium chloride SA (KLOR-CON) 20 MEQ tablet Take 2 tablets (40 mEq  total) by mouth daily. Patient taking differently: Take 20 mEq by mouth daily. 01/09/20  Yes Jeray Shugart, Otila Kluver A, FNP  simvastatin (ZOCOR) 20 MG tablet Take 1 tablet (20 mg total) by mouth daily. 03/02/20  Yes Iloabachie, Chioma E, NP  spironolactone (ALDACTONE) 25 MG tablet Take 0.5 tablets (12.5 mg total) by mouth daily. 06/15/20 09/13/20 Yes Barb Merino, MD  torsemide (DEMADEX) 20 MG tablet Take 3 tablets (60 mg total) by mouth daily. 04/07/20 07/06/20 Yes Alisa Graff, FNP    Review of Systems  Constitutional: Positive for fatigue. Negative for appetite change.  HENT: Negative for congestion, postnasal drip and sore throat.  Eyes: Negative.   Respiratory: Positive for shortness of breath. Negative for apnea, cough and chest tightness.   Cardiovascular: Negative for chest pain, palpitations and leg swelling.  Gastrointestinal: Negative for abdominal distention and abdominal pain.  Endocrine: Negative.   Genitourinary: Negative.   Musculoskeletal: Positive for arthralgias (left shoulder) and back pain. Negative for neck pain.  Skin: Positive for rash (lower abdomen/ hands).  Allergic/Immunologic: Positive for food allergies (Had allergic reaction to pork rinds).  Neurological: Negative for dizziness and light-headedness.  Hematological: Negative for adenopathy. Does not bruise/bleed easily.  Psychiatric/Behavioral: Positive for sleep disturbance (sleeping on 1 pillow). Negative for dysphoric mood. The patient is not nervous/anxious.    Vitals:   06/21/20 0929  BP: 122/80  Pulse: 77  Resp: 20  SpO2: 99%  Weight: (!) 311 lb (141.1 kg)  Height: 5' 7" (1.702 m)   Wt Readings from Last 3 Encounters:  06/21/20 (!) 311 lb (141.1 kg)  06/16/20 (!) 313 lb (142 kg)  06/15/20 (!) 313 lb 3.2 oz (142.1 kg)   Lab Results  Component Value Date   CREATININE 0.65 06/14/2020   CREATININE 0.67 06/13/2020   CREATININE 0.63 06/12/2020    Physical Exam Vitals and nursing note reviewed.   Constitutional:      General: She is not in acute distress.    Appearance: Normal appearance. She is well-developed. She is not ill-appearing, toxic-appearing or diaphoretic.  HENT:     Head: Normocephalic and atraumatic.  Neck:     Vascular: No JVD.  Cardiovascular:     Rate and Rhythm: Normal rate and regular rhythm.  Pulmonary:     Effort: Pulmonary effort is normal. No respiratory distress.     Breath sounds: No wheezing, rhonchi or rales.  Abdominal:     Palpations: Abdomen is soft.     Tenderness: There is no abdominal tenderness.  Musculoskeletal:        General: No tenderness.     Cervical back: Neck supple.     Right lower leg: No tenderness. No edema.     Left lower leg: No tenderness. No edema.  Skin:    General: Skin is warm and dry.     Findings: Rash (lower abdomen) present.     Comments: Pealing skin on right palm  Neurological:     General: No focal deficit present.     Mental Status: She is alert and oriented to person, place, and time.  Psychiatric:        Mood and Affect: Mood normal.        Behavior: Behavior normal.     Assessment & Plan:  1: Chronic heart failure with preserved ejection fraction with structural changes (mild LVH)- - NYHA class II - euvolemic today  - weighing daily; reminded to call for an overnight weight gain of >2 pounds or a weekly weight gain of >5 pounds - weight down 10 pounds since she was last here 1 month ago - not adding salt but does eat out on occasion; has been trying to monitor her sodium intake when she does eat out - saw cardiology [place] Anon) 03/02/20 & returns 08/31/20 - will check BMP, Mg, Phosphorus and CBC today - spironolactone was started during admission; may be able to stop potassium supplements depending on lab results - had shortness of breath with lisinopril; may not be a good candidate for entresto - BNP 06/12/20 was 45.8  2: HTN- - BP looks good today  - saw PCP at [facility]  06/15/20 - BMP 06/14/20  reviewed and showed sodium 138, potassium 3.8, creatinine 0.65 and GFR >60  3: DM- - A1c 06/09/20 was 7.0% - glucose at home today was 105   Patient did not bring her medications nor a list. Each medication was verbally reviewed with the patient and she was encouraged to bring the bottles to every visit to confirm accuracy of list.  Return in 1 month or sooner for any questions/problems before then.

## 2020-06-19 LAB — IGP, APTIMA HPV: HPV Aptima: NEGATIVE

## 2020-06-21 ENCOUNTER — Other Ambulatory Visit: Payer: Self-pay

## 2020-06-21 ENCOUNTER — Encounter: Payer: Self-pay | Admitting: Family

## 2020-06-21 ENCOUNTER — Other Ambulatory Visit
Admission: RE | Admit: 2020-06-21 | Discharge: 2020-06-21 | Disposition: A | Discharge status: Home / Self Care | Payer: Self-pay | Source: Ambulatory Visit | Attending: Family | Admitting: Family

## 2020-06-21 ENCOUNTER — Telehealth: Payer: Self-pay | Admitting: Family

## 2020-06-21 ENCOUNTER — Ambulatory Visit: Payer: Self-pay | Attending: Family | Admitting: Family

## 2020-06-21 VITALS — BP 122/80 | HR 77 | Resp 20 | Ht 67.0 in | Wt 311.0 lb

## 2020-06-21 DIAGNOSIS — Z8249 Family history of ischemic heart disease and other diseases of the circulatory system: Secondary | ICD-10-CM | POA: Insufficient documentation

## 2020-06-21 DIAGNOSIS — I5032 Chronic diastolic (congestive) heart failure: Secondary | ICD-10-CM | POA: Insufficient documentation

## 2020-06-21 DIAGNOSIS — Z886 Allergy status to analgesic agent status: Secondary | ICD-10-CM | POA: Insufficient documentation

## 2020-06-21 DIAGNOSIS — I10 Essential (primary) hypertension: Secondary | ICD-10-CM

## 2020-06-21 DIAGNOSIS — Z885 Allergy status to narcotic agent status: Secondary | ICD-10-CM | POA: Insufficient documentation

## 2020-06-21 DIAGNOSIS — J45909 Unspecified asthma, uncomplicated: Secondary | ICD-10-CM | POA: Insufficient documentation

## 2020-06-21 DIAGNOSIS — E119 Type 2 diabetes mellitus without complications: Secondary | ICD-10-CM | POA: Insufficient documentation

## 2020-06-21 DIAGNOSIS — I110 Hypertensive heart disease with heart failure: Secondary | ICD-10-CM | POA: Insufficient documentation

## 2020-06-21 DIAGNOSIS — Z79899 Other long term (current) drug therapy: Secondary | ICD-10-CM | POA: Insufficient documentation

## 2020-06-21 DIAGNOSIS — Z888 Allergy status to other drugs, medicaments and biological substances status: Secondary | ICD-10-CM | POA: Insufficient documentation

## 2020-06-21 DIAGNOSIS — Z7984 Long term (current) use of oral hypoglycemic drugs: Secondary | ICD-10-CM | POA: Insufficient documentation

## 2020-06-21 DIAGNOSIS — Z7902 Long term (current) use of antithrombotics/antiplatelets: Secondary | ICD-10-CM | POA: Insufficient documentation

## 2020-06-21 DIAGNOSIS — Z87891 Personal history of nicotine dependence: Secondary | ICD-10-CM | POA: Insufficient documentation

## 2020-06-21 DIAGNOSIS — I2510 Atherosclerotic heart disease of native coronary artery without angina pectoris: Secondary | ICD-10-CM | POA: Insufficient documentation

## 2020-06-21 DIAGNOSIS — E785 Hyperlipidemia, unspecified: Secondary | ICD-10-CM | POA: Insufficient documentation

## 2020-06-21 LAB — BASIC METABOLIC PANEL
Anion gap: 12 (ref 5–15)
BUN: 14 mg/dL (ref 6–20)
CO2: 27 mmol/L (ref 22–32)
Calcium: 9.4 mg/dL (ref 8.9–10.3)
Chloride: 101 mmol/L (ref 98–111)
Creatinine, Ser: 0.73 mg/dL (ref 0.44–1.00)
GFR, Estimated: >60 mL/min (ref >60)
Glucose, Bld: 127 mg/dL — ABNORMAL HIGH (ref 70–99)
Potassium: 4 mmol/L (ref 3.5–5.1)
Sodium: 140 mmol/L (ref 135–145)

## 2020-06-21 LAB — CBC
HCT: 41.1 % (ref 36.0–46.0)
Hemoglobin: 13.4 g/dL (ref 12.0–15.0)
MCH: 28.3 pg (ref 26.0–34.0)
MCHC: 32.6 g/dL (ref 30.0–36.0)
MCV: 86.9 fL (ref 80.0–100.0)
Platelets: 312 10*3/uL (ref 150–400)
RBC: 4.73 MIL/uL (ref 3.87–5.11)
RDW: 15.7 % — ABNORMAL HIGH (ref 11.5–15.5)
WBC: 6.6 10*3/uL (ref 4.0–10.5)
nRBC: 0 % (ref 0.0–0.2)

## 2020-06-21 LAB — PHOSPHORUS: Phosphorus: 4 mg/dL (ref 2.5–4.6)

## 2020-06-21 LAB — MAGNESIUM: Magnesium: 2.1 mg/dL (ref 1.7–2.4)

## 2020-06-21 MED ORDER — POTASSIUM CHLORIDE CRYS ER 20 MEQ PO TBCR: 20.0000 meq | EXTENDED_RELEASE_TABLET | Freq: Every day | ORAL | 0 refills | Status: DC

## 2020-06-21 NOTE — Patient Instructions (Signed)
Continue weighing daily and call for an overnight weight gain of > 2 pounds or a weekly weight gain of >5 pounds. 

## 2020-06-21 NOTE — Telephone Encounter (Signed)
Spoke with patient regarding BMP results obtained earlier today. She was recently placed on spironolactone 12.5mg  daily and she has continued to take her potassium supplements at 73meq daily.   Potassium level today is 4.0 so advised patient to decrease her potassium supplement to 1meq daily. Will plan on rechecking her lab work 07/21/20 with possible stoppage of her potassium supplement at that time as we could always increase her spironolactone to 25mg  daily.   Patient verbalized understanding and was comfortable with this plan.

## 2020-06-23 NOTE — Progress Notes (Signed)
Letter mailed from [facility] to notify of normal mammogram results.  Patient to return in one year for annual screening.  Copy to [facility]. 

## 2020-06-28 ENCOUNTER — Ambulatory Visit: Payer: Self-pay | Admitting: Family

## 2020-06-30 ENCOUNTER — Other Ambulatory Visit: Payer: Self-pay

## 2020-07-02 ENCOUNTER — Other Ambulatory Visit: Payer: Self-pay | Admitting: Gerontology

## 2020-07-02 DIAGNOSIS — E119 Type 2 diabetes mellitus without complications: Secondary | ICD-10-CM

## 2020-07-08 ENCOUNTER — Other Ambulatory Visit: Payer: Self-pay | Admitting: Gerontology

## 2020-07-12 ENCOUNTER — Other Ambulatory Visit: Payer: Self-pay

## 2020-07-21 ENCOUNTER — Other Ambulatory Visit
Admission: RE | Admit: 2020-07-21 | Discharge: 2020-07-21 | Disposition: A | Discharge status: Home / Self Care | Payer: 59 | Source: Ambulatory Visit | Attending: Family | Admitting: Family

## 2020-07-21 ENCOUNTER — Encounter: Payer: Self-pay | Admitting: Family

## 2020-07-21 ENCOUNTER — Ambulatory Visit: Payer: 59 | Admitting: Family

## 2020-07-21 ENCOUNTER — Other Ambulatory Visit: Payer: Self-pay

## 2020-07-21 VITALS — BP 154/78 | HR 84 | Resp 20 | Ht 67.0 in | Wt 304.4 lb

## 2020-07-21 DIAGNOSIS — I5032 Chronic diastolic (congestive) heart failure: Secondary | ICD-10-CM | POA: Insufficient documentation

## 2020-07-21 DIAGNOSIS — I10 Essential (primary) hypertension: Secondary | ICD-10-CM

## 2020-07-21 DIAGNOSIS — E119 Type 2 diabetes mellitus without complications: Secondary | ICD-10-CM

## 2020-07-21 LAB — BASIC METABOLIC PANEL
Anion gap: 11 (ref 5–15)
BUN: 10 mg/dL (ref 6–20)
CO2: 29 mmol/L (ref 22–32)
Calcium: 9.3 mg/dL (ref 8.9–10.3)
Chloride: 100 mmol/L (ref 98–111)
Creatinine, Ser: 0.77 mg/dL (ref 0.44–1.00)
GFR, Estimated: >60 mL/min (ref >60)
Glucose, Bld: 143 mg/dL — ABNORMAL HIGH (ref 70–99)
Potassium: 3.4 mmol/L — ABNORMAL LOW (ref 3.5–5.1)
Sodium: 140 mmol/L (ref 135–145)

## 2020-07-21 NOTE — Progress Notes (Signed)
Patient ID: Kathy [name], female    DOB: 1965/04/29, 55 y.o.   MRN: [id number]  Kathy [name] is a 55 y/o female with a history of asthma, CAD, hyperlipidemia, HTN, seizures, DM, previous tobacco use and chronic heart failure.   Echo report from 06/14/20 reviewed and showed an EF of 50-55% along with mild LVH. Echo report from 01/06/20 reviewed and showed an EF of 55-60% along with mild LVH and mild MR. Echo report from 02/27/17 reviewed and showed an EF of 55-60%.  Admitted 06/12/20 due to acute on chronic HF. Cardiology consult obtained. Initially given IV lasix with transition to oral diuretics. Discharged after 2 days.   She presents today for her follow up visit with a chief complaint of minimal fatigue upon moderate exertion. She describes this as chronic in nature having been present for several months. She has associated difficulty sleeping and chronic pain along with this. She denies any dizziness, abdominal distention, palpitations, pedal edema, chest pain, shortness of breath, cough or weight gain.   Has noted a continued gradual weight loss due to watching her sodium and fluid intake carefully.   She had noted some occasional leg cramps with the reduction in potassium so she's now taking 51mq AM/ 123m PM along with torsemide 6053mM/ 63m78m with improvement in her leg cramps.   Past Medical History:  Diagnosis Date  . 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 02/2016 Echo: EF 45%; b. 03/2016 Echo: EF 55%; c. 12/2019 Echo: EF 55-60%, no rwma, gr2 DD, nl RV size/fxn, mildly dil RA, mild MR.  . Asthma   . Chronic chest pain    a. ? vasospasm vs endothelial dysfxn. Improved w/ Ranexa but couldn't afford.  . 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 Presumed in setting of prior h/o MI w/ insignificant coronary dzs.  . Diabetes mellitus without complication (HCC)[place]. Heart murmur   . History of NICM (nonischemic cardiomyopathy) (HCC)[place] a. 02/2016 Echo: EF 45%; b.  12/2019 Echo: EF 55-60%.  . Hyperlipidemia   . Hypertension   . Myocardial infarction (HCC)[employer]. Non-obstructive Coronary artery disease    a. Prior caths in 2007, 2011, 2012 - nonobs dzs; b. 03/2016 Cath ([facility]): LM nl, LAD/LCX/RCA insignificant dzs.  . Obesity   . Rate-dependent LBBB (left bundle branch block)   . Remote Tobacco abuse    a. 30+ pack year history - quit 2019.  . Seizures (HCC)[place] Past Surgical History:  Procedure Laterality Date  . ANTERIOR CRUCIATE LIGAMENT REPAIR    . BACK SURGERY     L4 and L5  . CARDIAC CATHETERIZATION  04/09/2016  . COLONOSCOPY    . COLONOSCOPY WITH PROPOFOL N/A 08/04/2019   Procedure: COLONOSCOPY WITH PROPOFOL;  Surgeon: VangLin Landsman;  [facility];  Service: Gastroenterology;  Laterality: N/A;  . EMPYEMA DRAINAGE     at age 74  64TONSILLECTOMY    . TONSILLECTOMY AND ADENOIDECTOMY  2008  . TUBAL LIGATION     Family History  Problem Relation Age of Onset  . Kidney disease Sister   . Diabetes Sister   . COPD Sister   . Stroke Sister   . Diabetes Mother   . Stroke Mother   . Heart attack Mother   . Stroke Father   . Heart attack Father   . Asthma Daughter   . Asthma Son    Social  History   Tobacco Use  . Smoking status: Former Smoker    Packs/day: 1.00    Years: 30.00    Pack years: 30.00    Types: Cigarettes    Quit date: 08/16/2017    Years since quitting: 2.9  . Smokeless tobacco: Never Used  . Tobacco comment: Decreased smoking to less than 5 cigs/day  Substance Use Topics  . Alcohol use: No   Allergies  Allergen Reactions  . Aspirin Anaphylaxis  . Celebrex [Celecoxib] Anaphylaxis and Hives  . Fentanyl Anaphylaxis  . Latex Hives  . Lisinopril Shortness Of Breath  . Morphine Anaphylaxis  . Percocet [Oxycodone-Acetaminophen] Hives and Shortness Of Breath  . Simvastatin-High Dose Hives and Shortness Of Breath    Can tolerate 20 mg dose.   Jonah Blue [Metaxalone] Anaphylaxis  . Pork-Derived  Metallurgist  . Vicodin [Hydrocodone-Acetaminophen] Hives and Other (See Comments)    Feels like her tongue swells  . Albuterol Palpitations   Prior to Admission medications   Medication Sig Start Date End Date Taking? Authorizing Provider  blood glucose meter kit and supplies KIT Dispense based on patient and insurance preference. Use up to four times daily as directed. (FOR ICD-9 250.00, 250.01). 03/02/20  Yes Iloabachie, Chioma E, NP  clopidogrel (PLAVIX) 75 MG tablet TAKE ONE TABLET BY MOUTH EVERY DAY 05/13/20 05/13/21 Yes Iloabachie, Chioma E, NP  diphenhydrAMINE (BENADRYL) 50 MG tablet Take 50 mg by mouth 2 (two) times daily.   Yes [provider]  EPINEPHrine 0.3 mg/0.3 mL IJ SOAJ injection Inject 0.3 mg into the muscle as needed for anaphylaxis. 01/01/20  Yes Iloabachie, Chioma E, NP  EPIPEN 2-PAK 0.3 MG/0.3ML SOAJ injection INJECT 0.3MLS INTO THE MUSCLE AS NEEDED FOR ANAPHYLAXIS 01/08/20 01/07/21 Yes Iloabachie, Chioma E, NP  furosemide (LASIX) 80 MG tablet TAKE ONE TABLET BY MOUTH EVERY DAY 01/09/20 01/08/21 Yes Shakeya Kerkman A, FNP  isosorbide mononitrate (IMDUR) 60 MG 24 hr tablet TAKE ONE TABLET BY MOUTH TWICE DAILY 05/13/20 05/13/21 Yes Iloabachie, Chioma E, NP  meclizine (ANTIVERT) 25 MG tablet Take 1 tablet (25 mg total) by mouth 3 (three) times daily as needed for dizziness. 12/16/15  Yes McGowan, Larene Beach A, PA-C  metFORMIN (GLUCOPHAGE) 1000 MG tablet Take 1 tablet (1,000 mg total) by mouth 2 (two) times daily with a meal. 06/15/20  Yes Iloabachie, Chioma E, NP  metFORMIN (GLUCOPHAGE) 500 MG tablet TAKE ONE TABLET BY MOUTH 2 TIMES A DAY WITH MEALS 07/08/20 07/08/21 Yes Iloabachie, Chioma E, NP  metoprolol tartrate (LOPRESSOR) 25 MG tablet TAKE ONE TABLET BY MOUTH 2 TIMES A DAY 06/01/20 06/01/21 Yes Iloabachie, Chioma E, NP  nitroGLYCERIN (NITROSTAT) 0.4 MG SL tablet 1 TABLET UNDER TONGUE AS NEEDED FOR CHEST PAIN EVERY 5 MINUTES FOR MAX OF 3 DOSES IN 15 MINUTES; IF NO RELIEF AFTER 1ST DOSE  CALL 911 05/13/19  Yes McGowan, Shannon A, PA-C  pantoprazole (PROTONIX) 40 MG tablet TAKE ONE TABLET BY MOUTH EVERY DAY 05/13/20 05/13/21 Yes Iloabachie, Chioma E, NP  potassium chloride SA (KLOR-CON) 20 MEQ tablet Take 1 tablet (20 mEq total) by mouth daily. Patient taking differently: Take 20 mEq by mouth daily. 67mq AM/ 162m PM 06/21/20  Yes HaAlisa GraffFNNorth [id number] BLOOD GLUCOSE test strip AS DIRECTED 04/05/20 04/05/21 Yes   simvastatin (ZOCOR) 20 MG tablet Take 1 tablet (20 mg total) by mouth daily. 03/02/20  Yes Iloabachie, Chioma E, NP  spironolactone (ALDACTONE) 25 MG tablet TAKE 1/2 TABLET BY MOUTH EVERY DAY  06/14/20 12/08/20 Yes Ghimire, Dante Gang, MD  torsemide (DEMADEX) 20 MG tablet TAKE 3 TABLETS BY MOUTH EVERY DAY Patient taking differently: Take by mouth daily. 29m AM/ 282mPM 04/07/20 04/07/21 Yes HaAlisa GraffFNP    Review of Systems  Constitutional: Positive for fatigue ("very little"). Negative for appetite change.  HENT: Negative for congestion, postnasal drip and sore throat.   Eyes: Negative.   Respiratory: Negative for apnea, cough, chest tightness and shortness of breath.   Cardiovascular: Negative for chest pain, palpitations and leg swelling.  Gastrointestinal: Negative for abdominal distention and abdominal pain.  Endocrine: Negative.   Genitourinary: Negative.   Musculoskeletal: Positive for arthralgias (left shoulder) and back pain. Negative for neck pain.  Skin: Positive for rash (lower abdomen/ hands).  Allergic/Immunologic: Positive for food allergies (Had allergic reaction to pork rinds).  Neurological: Negative for dizziness and light-headedness.  Hematological: Negative for adenopathy. Does not bruise/bleed easily.  Psychiatric/Behavioral: Positive for sleep disturbance (sleeping on 1 pillow). Negative for dysphoric mood. The patient is not nervous/anxious.    Vitals:   07/21/20 1231  BP: (!) 154/78  Pulse: 84  Resp: 20  SpO2: 95%  Weight: (!)  304 lb 6 oz (138.1 kg)  Height: 5' 7"  (1.702 m)   Wt Readings from Last 3 Encounters:  07/21/20 (!) 304 lb 6 oz (138.1 kg)  06/21/20 (!) 311 lb (141.1 kg)  06/16/20 (!) 313 lb (142 kg)   Lab Results  Component Value Date   CREATININE 0.73 06/21/2020   CREATININE 0.65 06/14/2020   CREATININE 0.67 06/13/2020    Physical Exam Vitals and nursing note reviewed.  Constitutional:      General: She is not in acute distress.    Appearance: Normal appearance. She is well-developed. She is not ill-appearing, toxic-appearing or diaphoretic.  HENT:     Head: Normocephalic and atraumatic.  Neck:     Vascular: No JVD.  Cardiovascular:     Rate and Rhythm: Normal rate and regular rhythm.  Pulmonary:     Effort: Pulmonary effort is normal. No respiratory distress.     Breath sounds: No wheezing, rhonchi or rales.  Abdominal:     Palpations: Abdomen is soft.     Tenderness: There is no abdominal tenderness.  Musculoskeletal:        General: No tenderness.     Cervical back: Neck supple.     Right lower leg: No tenderness. No edema.     Left lower leg: No tenderness. No edema.  Skin:    General: Skin is warm and dry.     Findings: Rash (lower abdomen) present.     Comments: Pealing skin on right palm  Neurological:     General: No focal deficit present.     Mental Status: She is alert and oriented to person, place, and time.  Psychiatric:        Mood and Affect: Mood normal.        Behavior: Behavior normal.     Assessment & Plan:  1: Chronic heart failure with preserved ejection fraction with structural changes (mild LVH)- - NYHA class II - euvolemic today  - weighing daily; reminded to call for an overnight weight gain of >2 pounds or a weekly weight gain of >5 pounds - weight down 7 pounds since she was last here 1 month ago; has been following a daily 2L fluid intake along with closely following a 20009modium diet - saw cardiology ([facility] Anon1/23/21 & returns 08/31/20 -  potassium decreased at last visit after getting lab results back & she says that she started getting leg cramps so she started taking an additional 54mq in the evening with resolution of cramps - will recheck BMP today - had shortness of breath with lisinopril; may not be a good candidate for entresto - BNP 06/12/20 was 45.8  2: HTN- - BP mildly elevated today but patient says that she's been under quite a bit of stress and was upset "about something" prior to coming today - [facility] - BMP 06/21/20 reviewed and showed sodium 140, potassium 4.0, creatinine 0.73 and GFR >60  3: DM- - A1c 06/09/20 was 7.0% - glucose at home today was 127   Patient did not bring her medications nor a list. Each medication was verbally reviewed with the patient and she was encouraged to bring the bottles to every visit to confirm accuracy of list.   Return in 4 months or sooner for any questions/problems before then.

## 2020-07-21 NOTE — Patient Instructions (Signed)
Continue weighing daily and call for an overnight weight gain of > 2 pounds or a weekly weight gain of >5 pounds. 

## 2020-07-22 ENCOUNTER — Telehealth: Payer: Self-pay | Admitting: Family

## 2020-07-22 DIAGNOSIS — I10 Essential (primary) hypertension: Secondary | ICD-10-CM

## 2020-07-22 MED ORDER — POTASSIUM CHLORIDE CRYS ER 20 MEQ PO TBCR: 20.0000 meq | EXTENDED_RELEASE_TABLET | Freq: Two times a day (BID) | ORAL | 0 refills | Status: DC

## 2020-07-22 NOTE — Telephone Encounter (Signed)
Spoke with patient regarding her BMP results obtained yesterday. Renal function looks great but potassium level is slightly low at 3.4.   She is currently taking potassium 37meq AM/ 28meq PM. Advised her to return to taking 1 tablet BID. She has lab appt already scheduled early June.   Patient verbalized understanding.

## 2020-08-12 ENCOUNTER — Other Ambulatory Visit: Payer: Self-pay

## 2020-08-12 MED ORDER — RIGHTEST GL300 LANCETS MISC
11 refills | Status: DC
  Filled 2020-08-12: qty 100, 30d supply, fill #0
  Filled 2021-03-12: qty 100, 30d supply, fill #1
  Filled 2021-05-14: qty 100, 30d supply, fill #2
  Filled 2021-07-29: qty 100, 30d supply, fill #3

## 2020-08-12 MED FILL — Pantoprazole Sodium EC Tab 40 MG (Base Equiv): ORAL | 90 days supply | Qty: 90 | Fill #0 | Status: AC

## 2020-08-12 MED FILL — Spironolactone Tab 25 MG: ORAL | 30 days supply | Qty: 15 | Fill #0 | Status: AC

## 2020-08-12 MED FILL — Glucose Blood Test Strip: 30 days supply | Qty: 100 | Fill #0 | Status: AC

## 2020-08-12 MED FILL — Isosorbide Mononitrate Tab ER 24HR 60 MG: ORAL | 30 days supply | Qty: 60 | Fill #0 | Status: AC

## 2020-08-24 ENCOUNTER — Telehealth: Payer: Self-pay | Admitting: Pharmacist

## 2020-08-24 ENCOUNTER — Other Ambulatory Visit: Payer: Self-pay | Admitting: Gerontology

## 2020-08-24 DIAGNOSIS — M25512 Pain in left shoulder: Secondary | ICD-10-CM

## 2020-08-24 NOTE — Telephone Encounter (Signed)
08/24/2020 10:00:55 AM - EpiPen refill called to [facility]  -- Elmer Picker - Tuesday, Aug 24, 2020 9:59 AM --Katheran Awe [phone] spoke with Charlotte Crumb to place refill, allow 5-10 business days for [place] to receive.

## 2020-08-27 ENCOUNTER — Other Ambulatory Visit: Payer: Self-pay

## 2020-08-31 ENCOUNTER — Other Ambulatory Visit: Payer: Self-pay

## 2020-08-31 ENCOUNTER — Ambulatory Visit (INDEPENDENT_AMBULATORY_CARE_PROVIDER_SITE_OTHER): Payer: 59 | Admitting: Cardiovascular Disease

## 2020-08-31 ENCOUNTER — Telehealth: Payer: Self-pay | Admitting: Pharmacy Technician

## 2020-08-31 VITALS — BP 120/82 | HR 69 | Ht 67.0 in | Wt 301.0 lb

## 2020-08-31 DIAGNOSIS — Z87891 Personal history of nicotine dependence: Secondary | ICD-10-CM | POA: Diagnosis not present

## 2020-08-31 DIAGNOSIS — R011 Cardiac murmur, unspecified: Secondary | ICD-10-CM | POA: Diagnosis not present

## 2020-08-31 DIAGNOSIS — I5032 Chronic diastolic (congestive) heart failure: Secondary | ICD-10-CM

## 2020-08-31 DIAGNOSIS — E785 Hyperlipidemia, unspecified: Secondary | ICD-10-CM

## 2020-08-31 DIAGNOSIS — I2510 Atherosclerotic heart disease of native coronary artery without angina pectoris: Secondary | ICD-10-CM | POA: Diagnosis not present

## 2020-08-31 NOTE — Patient Instructions (Addendum)
Medication Instructions:  Your physician recommends that you continue on your current medications as directed. Please refer to the Current Medication list given to you today.  *If you need a refill on your cardiac medications before your next appointment, please call your pharmacy*   Lab Work: None ordered If you have labs (blood work) drawn today and your tests are completely normal, you will receive your results only by: Marland Kitchen MyChart Message (if you have MyChart) OR . A paper copy in the mail If you have any lab test that is abnormal or we need to change your treatment, we will call you to review the results.   Testing/Procedures: None ordered   Follow-Up: At [facility], you and your health needs are our priority.  As part of our continuing mission to provide you with exceptional heart care, we have created designated Provider Care Teams.  These Care Teams include your primary Cardiologist (physician) and Advanced Practice Providers (APPs -  Physician Assistants and Nurse Practitioners) who all work together to provide you with the care you need, when you need it.  We recommend signing up for the patient portal called "MyChart".  Sign up information is provided on this After Visit Summary.  MyChart is used to connect with patients for Virtual Visits (Telemedicine).  Patients are able to view lab/test results, encounter notes, upcoming appointments, etc.  Non-urgent messages can be sent to your provider as well.   To learn more about what you can do with MyChart, go to [email or web address].    Your next appointment:   Your physician wants you to follow-up in: 6 months You will receive a reminder letter in the mail two months in advance. If you don't receive a letter, please call our office to schedule the follow-up appointment.   The format for your next appointment:   In Person  Provider:   You may see [name], MD or one of the following Advanced Practice Providers on  your designated Care Team:    Murray Hodgkins, NP  Christell Faith, PA-C  Marrianne Mood, PA-C  Cadence Kathlen Mody, [place]  Laurann Montana, NP    Other Instructions  Low-Sodium Eating Plan Sodium, which is an element that makes up salt, helps you maintain a healthy balance of fluids in your body. Too much sodium can increase your blood pressure and cause fluid and waste to be held in your body. Your health care provider or dietitian may recommend following this plan if you have high blood pressure (hypertension), kidney disease, liver disease, or heart failure. Eating less sodium can help lower your blood pressure, reduce swelling, and protect your heart, liver, and kidneys. What are tips for following this plan? Reading food labels  The Nutrition Facts label lists the amount of sodium in one serving of the food. If you eat more than one serving, you must multiply the listed amount of sodium by the number of servings.  Choose foods with less than 140 mg of sodium per serving.  Avoid foods with 300 mg of sodium or more per serving. Shopping  Look for lower-sodium products, often labeled as "low-sodium" or "no salt added."  Always check the sodium content, even if foods are labeled as "unsalted" or "no salt added."  Buy fresh foods. ? Avoid canned foods and pre-made or frozen meals. ? Avoid canned, cured, or processed meats.  Buy breads that have less than 80 mg of sodium per slice.   Cooking  Eat more home-cooked food and less restaurant, buffet,  and fast food.  Avoid adding salt when cooking. Use salt-free seasonings or herbs instead of table salt or sea salt. Check with your health care provider or pharmacist before using salt substitutes.  Cook with plant-based oils, such as canola, sunflower, or olive oil.   Meal planning  When eating at a restaurant, ask that your food be prepared with less salt or no salt, if possible. Avoid dishes labeled as brined, pickled, cured, smoked,  or made with soy sauce, miso, or teriyaki sauce.  Avoid foods that contain MSG (monosodium glutamate). MSG is sometimes added to [place] food, bouillon, and some canned foods.  Make meals that can be grilled, baked, poached, roasted, or steamed. These are generally made with less sodium. General information Most people on this plan should limit their sodium intake to 1,500-2,000 mg (milligrams) of sodium each day. What foods should I eat? Fruits Fresh, frozen, or canned fruit. Fruit juice. Vegetables Fresh or frozen vegetables. "No salt added" canned vegetables. "No salt added" tomato sauce and paste. Low-sodium or reduced-sodium tomato and vegetable juice. Grains Low-sodium cereals, including oats, puffed wheat and rice, and shredded wheat. Low-sodium crackers. Unsalted rice. Unsalted pasta. Low-sodium bread. Whole-grain breads and whole-grain pasta. Meats and other proteins Fresh or frozen (no salt added) meat, poultry, seafood, and fish. Low-sodium canned tuna and salmon. Unsalted nuts. Dried peas, beans, and lentils without added salt. Unsalted canned beans. Eggs. Unsalted nut butters. Dairy Milk. Soy milk. Cheese that is naturally low in sodium, such as ricotta cheese, fresh mozzarella, or Swiss cheese. Low-sodium or reduced-sodium cheese. Cream cheese. Yogurt. Seasonings and condiments Fresh and dried herbs and spices. Salt-free seasonings. Low-sodium mustard and ketchup. Sodium-free salad dressing. Sodium-free light mayonnaise. Fresh or refrigerated horseradish. Lemon juice. Vinegar. Other foods Homemade, reduced-sodium, or low-sodium soups. Unsalted popcorn and pretzels. Low-salt or salt-free chips. The items listed above may not be a complete list of foods and beverages you can eat. Contact a dietitian for more information. What foods should I avoid? Vegetables Sauerkraut, pickled vegetables, and relishes. Olives. [place] fries. Onion rings. Regular canned vegetables (not low-sodium  or reduced-sodium). Regular canned tomato sauce and paste (not low-sodium or reduced-sodium). Regular tomato and vegetable juice (not low-sodium or reduced-sodium). Frozen vegetables in sauces. Grains Instant hot cereals. Bread stuffing, pancake, and biscuit mixes. Croutons. Seasoned rice or pasta mixes. Noodle soup cups. Boxed or frozen macaroni and cheese. Regular salted crackers. Self-rising flour. Meats and other proteins Meat or fish that is salted, canned, smoked, spiced, or pickled. Precooked or cured meat, such as sausages or meat loaves. Berniece Salines. Ham. Pepperoni. Hot dogs. Corned beef. Chipped beef. Salt pork. Jerky. Pickled herring. Anchovies and sardines. Regular canned tuna. Salted nuts. Dairy Processed cheese and cheese spreads. Hard cheeses. Cheese curds. Blue cheese. Feta cheese. String cheese. Regular cottage cheese. Buttermilk. Canned milk. Fats and oils Salted butter. Regular margarine. Ghee. Bacon fat. Seasonings and condiments Onion salt, garlic salt, seasoned salt, table salt, and sea salt. Canned and packaged gravies. Worcestershire sauce. Tartar sauce. Barbecue sauce. Teriyaki sauce. Soy sauce, including reduced-sodium. Steak sauce. Fish sauce. Oyster sauce. Cocktail sauce. Horseradish that you find on the shelf. Regular ketchup and mustard. Meat flavorings and tenderizers. Bouillon cubes. Hot sauce. Pre-made or packaged marinades. Pre-made or packaged taco seasonings. Relishes. Regular salad dressings. Salsa. Other foods Salted popcorn and pretzels. Corn chips and puffs. Potato and tortilla chips. Canned or dried soups. Pizza. Frozen entrees and pot pies. The items listed above may not be a complete list of  foods and beverages you should avoid. Contact a dietitian for more information. Summary  Eating less sodium can help lower your blood pressure, reduce swelling, and protect your heart, liver, and kidneys.  Most people on this plan should limit their sodium intake to  1,500-2,000 mg (milligrams) of sodium each day.  Canned, boxed, and frozen foods are high in sodium. Restaurant foods, fast foods, and pizza are also very high in sodium. You also get sodium by adding salt to food.  Try to cook at home, eat more fresh fruits and vegetables, and eat less fast food and canned, processed, or prepared foods. This information is not intended to replace advice given to you by your health care provider. Make sure you discuss any questions you have with your health care provider. Document Revised: 05/02/2019 Document Reviewed: 02/26/2019 Elsevier Patient Education  2021 [employer].

## 2020-08-31 NOTE — Telephone Encounter (Signed)
Pt has prescription drug coverage with [employer].  No longer meets [facility]'s eligibility criteria.  Pt notified by letter.  [facility]   Charna [address] [place], [place]  [address]  Aug 31, 2020    Kathy [name] [address] [place], [place]  [address]  Dear [place]:  This is to inform you that you are no longer eligible to receive medication assistance at [facility].  The reason(s) are:    _____Your total gross monthly household income exceeds 250% of the Federal Poverty Level.   _____Tangible assets (savings, checking, stocks/bonds, pension, retirement, etc.) exceeds our limit  _____You are eligible to receive benefits from [facility], [facility] or HIV Medication            Assistance program _____You are eligible to receive benefits from a Medicare Part "D" plan __X__You have prescription insurance with [place] _____You are not an [facility] resident _____Failure to provide all requested proof of income information for 2022.    We regret that we are unable to help you at this time.  If your prescription coverage is terminated, please contact [facility], so that we may reassess your eligibility for our program.  If you have questions, we may be contacted at [phone].  Thank you,  [facility]

## 2020-08-31 NOTE — Progress Notes (Signed)
Cardiology Office Note   Date:  08/31/2020   ID:  Kathy [name], DOB [birth year], MRN [id number]  PCP:  Langston Reusing, NP  Cardiologist:   [name], MD   Chief Complaint  Patient presents with  . Other    6 month f/u no complaints today. Meds reviewed verbally with pt.      History of Present Illness: Kathy [name] is a 55 y.o. female who is here today for follow-up visit regarding chronic chest pain, left bundle branch block and chronic diastolic heart failure.    The patient has known history of recurrent chest pain usually responsive to nitroglycerin which is thought to be due to coronary spasm or endothelial dysfunction.    She had cardiac catheterization done 4 times in the past in 2007, 2011, 2012 and most recently in December 2017 . All these showed mild nonobstructive coronary artery disease.  She is known to have rate dependent left bundle branch block.  She is allergic to aspirin and thus on long-term Plavix.  Other medical problems include previous tobacco use, obesity, chronic back pain and hyperlipidemia.  She has family history of coronary artery disease. We tried her on Ranexa last year with significant improvement in symptoms.  However, she could not continue the medication due to cost.   She was hospitalized in March of this year with acute on chronic diastolic heart failure in the setting of excessive sodium and fluid intake.  She improved with IV furosemide.  Echocardiogram showed an EF of 50 to 55%.  She is currently on torsemide 60 mg in the morning and 20 mg in the afternoon.  She has been following low-sodium diet very carefully and has managed to lose about 20 pounds with stable renal function.  She is doing very well and trying to exercise more.  Past Medical History:  Diagnosis Date  . 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 02/2016 Echo: EF 45%; b. 03/2016 Echo: EF 55%; c. 12/2019 Echo: EF 55-60%, no rwma, gr2 DD, nl RV  size/fxn, mildly dil RA, mild MR.  . Asthma   . Chronic chest pain    a. ? vasospasm vs endothelial dysfxn. Improved w/ Ranexa but couldn't afford.  . 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 Presumed in setting of prior h/o MI w/ insignificant coronary dzs.  . Diabetes mellitus without complication ([place])   . Heart murmur   . History of NICM (nonischemic cardiomyopathy) ([place])    a. 02/2016 Echo: EF 45%; b. 12/2019 Echo: EF 55-60%.  . Hyperlipidemia   . Hypertension   . Myocardial infarction ([place])   . Non-obstructive Coronary artery disease    a. Prior caths in 2007, 2011, 2012 - nonobs dzs; b. 03/2016 Cath ([facility]): LM nl, LAD/LCX/RCA insignificant dzs.  . Obesity   . Rate-dependent LBBB (left bundle branch block)   . Remote Tobacco abuse    a. 30+ pack year history - quit 2019.  . Seizures ([place])     Past Surgical History:  Procedure Laterality Date  . ANTERIOR CRUCIATE LIGAMENT REPAIR    . BACK SURGERY     L4 and L5  . CARDIAC CATHETERIZATION  04/09/2016  . COLONOSCOPY    . COLONOSCOPY WITH PROPOFOL N/A 08/04/2019   Procedure: COLONOSCOPY WITH PROPOFOL;  Surgeon: Lin Landsman, MD;  Location: [facility] ENDOSCOPY;  Service: Gastroenterology;  Laterality: N/A;  . EMPYEMA DRAINAGE     at age 40  .  TONSILLECTOMY    . TONSILLECTOMY AND ADENOIDECTOMY  2008  . TUBAL LIGATION       Current Outpatient Medications  Medication Sig Dispense Refill  . blood glucose meter kit and supplies KIT Dispense based on patient and insurance preference. Use up to four times daily as directed. (FOR ICD-9 250.00, 250.01). 1 each 0  . clopidogrel (PLAVIX) 75 MG tablet TAKE ONE TABLET BY MOUTH EVERY DAY 90 tablet 1  . diphenhydrAMINE (BENADRYL) 50 MG tablet Take 50 mg by mouth 2 (two) times daily.    Marland Kitchen EPINEPHrine 0.3 mg/0.3 mL IJ SOAJ injection Inject 0.3 mg into the muscle as needed for anaphylaxis. 1 each 0  . isosorbide mononitrate (IMDUR) 60 MG 24 hr tablet TAKE ONE TABLET BY MOUTH TWICE DAILY 60  tablet 3  . meclizine (ANTIVERT) 25 MG tablet Take 1 tablet (25 mg total) by mouth 3 (three) times daily as needed for dizziness. 90 tablet 6  . metFORMIN (GLUCOPHAGE) 1000 MG tablet Take 1 tablet (1,000 mg total) by mouth 2 (two) times daily with a meal. 60 tablet 2  . metFORMIN (GLUCOPHAGE) 500 MG tablet TAKE ONE TABLET BY MOUTH 2 TIMES A DAY WITH MEALS 60 tablet 2  . metoprolol tartrate (LOPRESSOR) 25 MG tablet TAKE ONE TABLET BY MOUTH 2 TIMES A DAY 120 tablet 1  . nitroGLYCERIN (NITROSTAT) 0.4 MG SL tablet 1 TABLET UNDER TONGUE AS NEEDED FOR CHEST PAIN EVERY 5 MINUTES FOR MAX OF 3 DOSES IN 15 MINUTES; IF NO RELIEF AFTER 1ST DOSE CALL 911 25 tablet 0  . pantoprazole (PROTONIX) 40 MG tablet TAKE ONE TABLET BY MOUTH EVERY DAY 90 tablet 1  . potassium chloride SA (KLOR-CON) 20 MEQ tablet Take 1 tablet (20 mEq total) by mouth 2 (two) times daily. 30 tablet 0  . Rightest GL300 Lancets MISC Use as directed 100 each 11  . RIGHTEST [id number] BLOOD GLUCOSE test strip AS DIRECTED 100 strip 99  . simvastatin (ZOCOR) 20 MG tablet Take 1 tablet (20 mg total) by mouth daily. 90 tablet 0  . spironolactone (ALDACTONE) 25 MG tablet TAKE 1/2 TABLET BY MOUTH EVERY DAY 15 tablet 2  . torsemide (DEMADEX) 20 MG tablet Take 60 mg am and 20 mg pm qd.     No current facility-administered medications for this visit.    Allergies:   Aspirin, Celebrex [celecoxib], Fentanyl, Latex, Lisinopril, Morphine, Percocet [oxycodone-acetaminophen], Simvastatin-high dose, Skelaxin [metaxalone], Pork-derived products, Vicodin [hydrocodone-acetaminophen], and Albuterol    Social History:  The patient  reports that she quit smoking about 3 years ago. Her smoking use included cigarettes. She has a 30.00 pack-year smoking history. She has never used smokeless tobacco. She reports previous drug use. She reports that she does not drink alcohol.   Family History:  The patient's family history includes Asthma in her daughter and son; COPD in  her sister; Diabetes in her mother and sister; Heart attack in her father and mother; Kidney disease in her sister; Stroke in her father, mother, and sister.    ROS:  Please see the history of present illness.   Otherwise, review of systems are positive for none.   All other systems are reviewed and negative.    PHYSICAL EXAM: VS:  BP 120/82 (BP Location: Left Arm, Patient Position: Sitting, Cuff Size: Large)   Pulse 69   Ht _0  (1.702 m)   Wt (!) 301 lb (136.5 kg)   LMP 06/09/2018   SpO2 98%   BMI 47.14 kg/m  ,  BMI Body mass index is 47.14 kg/m. GEN: Well nourished, well developed, in no acute distress  HEENT: normal  Neck: no JVD, carotid bruits, or masses Cardiac: RRR; no  rubs, or gallops .  1/ 6 systolic murmur in the aortic area.  Mild bilateral leg edema Respiratory:  clear to auscultation bilaterally, normal work of breathing GI: soft, nontender, nondistended, + BS MS: no deformity or atrophy  Skin: warm and dry, no rash Neuro:  Strength and sensation are intact Psych: euthymic mood, full affect   EKG:  EKG is ordered today. The ekg ordered today demonstrates normal sinus rhythm with left bundle branch block.   Recent Labs: 06/12/2020: ALT 81; B Natriuretic Peptide 45.8 06/21/2020: Hemoglobin 13.4; Magnesium 2.1; Platelets 312 07/21/2020: BUN 10; Creatinine, Ser 0.77; Potassium 3.4; Sodium 140    Lipid Panel    Component Value Date/Time   CHOL 149 01/22/2019 1216   TRIG 167 (H) 01/22/2019 1216   HDL 27 (L) 01/22/2019 1216   CHOLHDL 5.5 (H) 01/22/2019 1216   CHOLHDL 5.7 02/24/2016 0618   VLDL 30 02/24/2016 0618   LDLCALC 93 01/22/2019 1216      Wt Readings from Last 3 Encounters:  08/31/20 (!) 301 lb (136.5 kg)  07/21/20 (!) 304 lb 6 oz (138.1 kg)  06/21/20 (!) 311 lb (141.1 kg)       PAD Screen 02/23/2017  Previous PAD dx? No  Previous surgical procedure? No  Pain with walking? Yes  Subsides with rest? No  Feet/toe relief with dangling? No   Painful, non-healing ulcers? No  Extremities discolored? No      ASSESSMENT AND PLAN:  1.  Mild coronary artery disease with other forms of angina:  She has chronic angina thought to be due to coronary spasm or endothelial dysfunction.  She seems to be significantly better after treating her diastolic heart failure.  Continue same medications for now.   2.  Chronic diastolic heart failure: Volume overload improved significantly with torsemide and following low-sodium diet.  She appears to be euvolemic at the present time.  3.  Hyperlipidemia: Continue treatment with simvastatin.  Most recent LDL was 93.  4.  Previous tobacco use: No relapse.  5.  Morbid obesity: I discussed with her the importance of continued healthy lifestyle changes and increasing her exercise.   Disposition:   FU with me in 6 months  Signed,  [name], MD  08/31/2020 3:47 PM    [place]

## 2020-09-01 ENCOUNTER — Other Ambulatory Visit: Payer: Self-pay

## 2020-09-03 ENCOUNTER — Other Ambulatory Visit: Payer: Self-pay

## 2020-09-07 ENCOUNTER — Other Ambulatory Visit: Payer: Self-pay

## 2020-09-08 ENCOUNTER — Other Ambulatory Visit: Payer: Self-pay

## 2020-09-09 ENCOUNTER — Other Ambulatory Visit: Payer: Self-pay

## 2020-09-09 ENCOUNTER — Telehealth: Payer: Self-pay | Admitting: Pharmacy Technician

## 2020-09-09 DIAGNOSIS — E119 Type 2 diabetes mellitus without complications: Secondary | ICD-10-CM

## 2020-09-09 NOTE — Telephone Encounter (Signed)
Spoke with [facility] and [facility].  Jaylin verified that patient's prescription drug coverage with [employer] was terminated on 09/07/20.  Case YJ#[id number].  [facility]

## 2020-09-10 ENCOUNTER — Other Ambulatory Visit: Payer: Self-pay

## 2020-09-10 LAB — HEMOGLOBIN A1C
Est. average glucose Bld gHb Est-mCnc: 148 mg/dL
Hgb A1c MFr Bld: 6.8 % — ABNORMAL HIGH (ref 4.8–5.6)

## 2020-09-10 MED FILL — Spironolactone Tab 25 MG: ORAL | 4 days supply | Qty: 2 | Fill #1 | Status: AC

## 2020-09-14 ENCOUNTER — Ambulatory Visit: Payer: Self-pay | Admitting: Gerontology

## 2020-09-14 ENCOUNTER — Other Ambulatory Visit: Payer: Self-pay

## 2020-09-14 ENCOUNTER — Encounter: Payer: Self-pay | Admitting: Gerontology

## 2020-09-14 VITALS — BP 127/94 | HR 84 | Temp 97.2°F | Resp 18 | Ht 67.0 in | Wt 302.0 lb

## 2020-09-14 DIAGNOSIS — M25512 Pain in left shoulder: Secondary | ICD-10-CM

## 2020-09-14 DIAGNOSIS — Z8679 Personal history of other diseases of the circulatory system: Secondary | ICD-10-CM

## 2020-09-14 DIAGNOSIS — I10 Essential (primary) hypertension: Secondary | ICD-10-CM

## 2020-09-14 DIAGNOSIS — E782 Mixed hyperlipidemia: Secondary | ICD-10-CM

## 2020-09-14 DIAGNOSIS — E1159 Type 2 diabetes mellitus with other circulatory complications: Secondary | ICD-10-CM

## 2020-09-14 DIAGNOSIS — E119 Type 2 diabetes mellitus without complications: Secondary | ICD-10-CM

## 2020-09-14 DIAGNOSIS — K219 Gastro-esophageal reflux disease without esophagitis: Secondary | ICD-10-CM

## 2020-09-14 MED ORDER — METFORMIN HCL 500 MG PO TABS
ORAL_TABLET | ORAL | 1 refills | Status: DC
  Filled 2020-09-14: qty 180, fill #0
  Filled 2020-10-05: qty 180, 90d supply, fill #0
  Filled 2021-01-04: qty 180, 90d supply, fill #1

## 2020-09-14 MED ORDER — CLOPIDOGREL BISULFATE 75 MG PO TABS
ORAL_TABLET | Freq: Every day | ORAL | 1 refills | Status: DC
  Filled 2020-09-14: qty 90, fill #0
  Filled 2020-10-05: qty 90, 90d supply, fill #0
  Filled 2021-01-04: qty 90, 90d supply, fill #1

## 2020-09-14 MED ORDER — SIMVASTATIN 20 MG PO TABS
20.0000 mg | ORAL_TABLET | Freq: Every day | ORAL | 1 refills | Status: DC
Start: 2020-09-14 — End: 2020-12-21
  Filled 2020-09-14: qty 90, 90d supply, fill #0
  Filled 2020-12-01 – 2020-12-10 (×2): qty 90, 90d supply, fill #1

## 2020-09-14 MED ORDER — ISOSORBIDE MONONITRATE ER 60 MG PO TB24
ORAL_TABLET | Freq: Two times a day (BID) | ORAL | 1 refills | Status: DC
  Filled 2020-09-14: qty 180, 90d supply, fill #0
  Filled 2020-12-15: qty 60, 30d supply, fill #1
  Filled 2021-01-13: qty 60, 30d supply, fill #2

## 2020-09-14 MED ORDER — PANTOPRAZOLE SODIUM 40 MG PO TBEC
DELAYED_RELEASE_TABLET | Freq: Every day | ORAL | 1 refills | Status: DC
Start: 2020-09-14 — End: 2021-03-15
  Filled 2020-09-14: qty 90, fill #0
  Filled 2020-12-01: qty 90, 90d supply, fill #0
  Filled 2021-03-10: qty 60, 60d supply, fill #1

## 2020-09-14 MED ORDER — METOPROLOL TARTRATE 25 MG PO TABS
ORAL_TABLET | Freq: Two times a day (BID) | ORAL | 1 refills | Status: DC
  Filled 2020-09-14: qty 180, fill #0
  Filled 2020-10-05: qty 180, 90d supply, fill #0
  Filled 2021-01-04: qty 180, 90d supply, fill #1

## 2020-09-14 NOTE — Patient Instructions (Signed)
https://www.nhlbi.nih.gov/files/docs/public/heart/dash_brief.pdf">  DASH Eating Plan DASH stands for Dietary Approaches to Stop Hypertension. The DASH eating plan is a healthy eating plan that has been shown to:  Reduce high blood pressure (hypertension).  Reduce your risk for type 2 diabetes, heart disease, and stroke.  Help with weight loss. What are tips for following this plan? Reading food labels  Check food labels for the amount of salt (sodium) per serving. Choose foods with less than 5 percent of the Daily Value of sodium. Generally, foods with less than 300 milligrams (mg) of sodium per serving fit into this eating plan.  To find whole grains, look for the word "whole" as the first word in the ingredient list. Shopping  Buy products labeled as "low-sodium" or "no salt added."  Buy fresh foods. Avoid canned foods and pre-made or frozen meals. Cooking  Avoid adding salt when cooking. Use salt-free seasonings or herbs instead of table salt or sea salt. Check with your health care provider or pharmacist before using salt substitutes.  Do not fry foods. Cook foods using healthy methods such as baking, boiling, grilling, roasting, and broiling instead.  Cook with heart-healthy oils, such as olive, canola, avocado, soybean, or sunflower oil. Meal planning  Eat a balanced diet that includes: ? 4 or more servings of fruits and 4 or more servings of vegetables each day. Try to fill one-half of your plate with fruits and vegetables. ? 6-8 servings of whole grains each day. ? Less than 6 oz (170 g) of lean meat, poultry, or fish each day. A 3-oz (85-g) serving of meat is about the same size as a deck of cards. One egg equals 1 oz (28 g). ? 2-3 servings of low-fat dairy each day. One serving is 1 cup (237 mL). ? 1 serving of nuts, seeds, or beans 5 times each week. ? 2-3 servings of heart-healthy fats. Healthy fats called omega-3 fatty acids are found in foods such as walnuts,  flaxseeds, fortified milks, and eggs. These fats are also found in cold-water fish, such as sardines, salmon, and mackerel.  Limit how much you eat of: ? Canned or prepackaged foods. ? Food that is high in trans fat, such as some fried foods. ? Food that is high in saturated fat, such as fatty meat. ? Desserts and other sweets, sugary drinks, and other foods with added sugar. ? Full-fat dairy products.  Do not salt foods before eating.  Do not eat more than 4 egg yolks a week.  Try to eat at least 2 vegetarian meals a week.  Eat more home-cooked food and less restaurant, buffet, and fast food.   Lifestyle  When eating at a restaurant, ask that your food be prepared with less salt or no salt, if possible.  If you drink alcohol: ? Limit how much you use to:  0-1 drink a day for women who are not pregnant.  0-2 drinks a day for men. ? Be aware of how much alcohol is in your drink. In the [place] one drink equals one 12 oz bottle of beer (355 mL), one 5 oz glass of wine (148 mL), or one 1 oz glass of hard liquor (44 mL). General information  Avoid eating more than 2,300 mg of salt a day. If you have hypertension, you may need to reduce your sodium intake to 1,500 mg a day.  Work with your health care provider to maintain a healthy body weight or to lose weight. Ask what an ideal weight is for   you.  Get at least 30 minutes of exercise that causes your heart to beat faster (aerobic exercise) most days of the week. Activities may include walking, swimming, or biking.  Work with your health care provider or dietitian to adjust your eating plan to your individual calorie needs. What foods should I eat? Fruits All fresh, dried, or frozen fruit. Canned fruit in natural juice (without added sugar). Vegetables Fresh or frozen vegetables (raw, steamed, roasted, or grilled). Low-sodium or reduced-sodium tomato and vegetable juice. Low-sodium or reduced-sodium tomato sauce and tomato paste.  Low-sodium or reduced-sodium canned vegetables. Grains Whole-grain or whole-wheat bread. Whole-grain or whole-wheat pasta. Brown rice. Oatmeal. Quinoa. Bulgur. Whole-grain and low-sodium cereals. Pita bread. Low-fat, low-sodium crackers. Whole-wheat flour tortillas. Meats and other proteins Skinless chicken or turkey. Ground chicken or turkey. Pork with fat trimmed off. Fish and seafood. Egg whites. Dried beans, peas, or lentils. Unsalted nuts, nut butters, and seeds. Unsalted canned beans. Lean cuts of beef with fat trimmed off. Low-sodium, lean precooked or cured meat, such as sausages or meat loaves. Dairy Low-fat (1%) or fat-free (skim) milk. Reduced-fat, low-fat, or fat-free cheeses. Nonfat, low-sodium ricotta or cottage cheese. Low-fat or nonfat yogurt. Low-fat, low-sodium cheese. Fats and oils Soft margarine without trans fats. Vegetable oil. Reduced-fat, low-fat, or light mayonnaise and salad dressings (reduced-sodium). Canola, safflower, olive, avocado, soybean, and sunflower oils. Avocado. Seasonings and condiments Herbs. Spices. Seasoning mixes without salt. Other foods Unsalted popcorn and pretzels. Fat-free sweets. The items listed above may not be a complete list of foods and beverages you can eat. Contact a dietitian for more information. What foods should I avoid? Fruits Canned fruit in a light or heavy syrup. Fried fruit. Fruit in cream or butter sauce. Vegetables Creamed or fried vegetables. Vegetables in a cheese sauce. Regular canned vegetables (not low-sodium or reduced-sodium). Regular canned tomato sauce and paste (not low-sodium or reduced-sodium). Regular tomato and vegetable juice (not low-sodium or reduced-sodium). Pickles. Olives. Grains Baked goods made with fat, such as croissants, muffins, or some breads. Dry pasta or rice meal packs. Meats and other proteins Fatty cuts of meat. Ribs. Fried meat. Bacon. Bologna, salami, and other precooked or cured meats, such as  sausages or meat loaves. Fat from the back of a pig (fatback). Bratwurst. Salted nuts and seeds. Canned beans with added salt. Canned or smoked fish. Whole eggs or egg yolks. Chicken or turkey with skin. Dairy Whole or 2% milk, cream, and half-and-half. Whole or full-fat cream cheese. Whole-fat or sweetened yogurt. Full-fat cheese. Nondairy creamers. Whipped toppings. Processed cheese and cheese spreads. Fats and oils Butter. Stick margarine. Lard. Shortening. Ghee. Bacon fat. Tropical oils, such as coconut, palm kernel, or palm oil. Seasonings and condiments Onion salt, garlic salt, seasoned salt, table salt, and sea salt. Worcestershire sauce. Tartar sauce. Barbecue sauce. Teriyaki sauce. Soy sauce, including reduced-sodium. Steak sauce. Canned and packaged gravies. Fish sauce. Oyster sauce. Cocktail sauce. Store-bought horseradish. Ketchup. Mustard. Meat flavorings and tenderizers. Bouillon cubes. Hot sauces. Pre-made or packaged marinades. Pre-made or packaged taco seasonings. Relishes. Regular salad dressings. Other foods Salted popcorn and pretzels. The items listed above may not be a complete list of foods and beverages you should avoid. Contact a dietitian for more information. Where to find more information  National Heart, Lung, and Blood Institute: www.nhlbi.nih.gov  American Heart Association: www.heart.org  Academy of Nutrition and Dietetics: www.eatright.org  National Kidney Foundation: www.kidney.org Summary  The DASH eating plan is a healthy eating plan that has been shown to   reduce high blood pressure (hypertension). It may also reduce your risk for type 2 diabetes, heart disease, and stroke.  When on the DASH eating plan, aim to eat more fresh fruits and vegetables, whole grains, lean proteins, low-fat dairy, and heart-healthy fats.  With the DASH eating plan, you should limit salt (sodium) intake to 2,300 mg a day. If you have hypertension, you may need to reduce your  sodium intake to 1,500 mg a day.  Work with your health care provider or dietitian to adjust your eating plan to your individual calorie needs. This information is not intended to replace advice given to you by your health care provider. Make sure you discuss any questions you have with your health care provider. Document Revised: 02/28/2019 Document Reviewed: 02/28/2019 Elsevier Patient Education  2021 [place]. Preventing High Cholesterol Cholesterol is a white, waxy substance similar to fat that the human body needs to help build cells. The liver makes all the cholesterol that a person's body needs. Having high cholesterol (hypercholesterolemia) increases your risk for heart disease and stroke. Extra or excess cholesterol comes from the food that you eat. High cholesterol can often be prevented with diet and lifestyle changes. If you already have high cholesterol, you can control it with diet, lifestyle changes, and medicines. How can high cholesterol affect me? If you have high cholesterol, fatty deposits (plaques) may build up on the walls of your blood vessels. The blood vessels that carry blood away from your heart are called arteries. Plaques make the arteries narrower and stiffer. This in turn can:  Restrict or block blood flow and cause blood clots to form.  Increase your risk for heart attack and stroke. What can increase my risk for high cholesterol? This condition is more likely to develop in people who:  Eat foods that are high in saturated fat or cholesterol. Saturated fat is mostly found in foods that come from animal sources.  Are overweight.  Are not getting enough exercise.  Have a family history of high cholesterol (familial hypercholesterolemia). What actions can I take to prevent this? Nutrition  Eat less saturated fat.  Avoid trans fats (partially hydrogenated oils). These are often found in margarine and in some baked goods, fried foods, and snacks bought in  packages.  Avoid precooked or cured meat, such as bacon, sausages, or meat loaves.  Avoid foods and drinks that have added sugars.  Eat more fruits, vegetables, and whole grains.  Choose healthy sources of protein, such as fish, poultry, lean cuts of red meat, beans, peas, lentils, and nuts.  Choose healthy sources of fat, such as: ? Nuts. ? Vegetable oils, especially olive oil. ? Fish that have healthy fats, such as omega-3 fatty acids. These fish include mackerel or salmon.   Lifestyle  Lose weight if you are overweight. Maintaining a healthy body mass index (BMI) can help prevent or control high cholesterol. It can also lower your risk for diabetes and high blood pressure. Ask your health care provider to help you with a diet and exercise plan to lose weight safely.  Do not use any products that contain nicotine or tobacco, such as cigarettes, e-cigarettes, and chewing tobacco. If you need help quitting, ask your health care provider. Alcohol use  Do not drink alcohol if: ? Your health care provider tells you not to drink. ? You are pregnant, may be pregnant, or are planning to become pregnant.  If you drink alcohol: ? Limit how much you use to:  0-1  drink a day for women.  0-2 drinks a day for men. ? Be aware of how much alcohol is in your drink. In the [place] one drink equals one 12 oz bottle of beer (355 mL), one 5 oz glass of wine (148 mL), or one 1 oz glass of hard liquor (44 mL). Activity  Get enough exercise. Do exercises as told by your health care provider.  Each week, do at least 150 minutes of exercise that takes a medium level of effort (moderate-intensity exercise). This kind of exercise: ? Makes your heart beat faster while allowing you to still be able to talk. ? Can be done in short sessions several times a day or longer sessions a few times a week. For example, on 5 days each week, you could walk fast or ride your bike 3 times a day for 10 minutes each time.    Medicines  Your health care provider may recommend medicines to help lower cholesterol. This may be a medicine to lower the amount of cholesterol that your liver makes. You may need medicine if: ? Diet and lifestyle changes have not lowered your cholesterol enough. ? You have high cholesterol and other risk factors for heart disease or stroke.  Take over-the-counter and prescription medicines only as told by your health care provider. General information  Manage your risk factors for high cholesterol. Talk with your health care provider about all your risk factors and how to lower your risk.  Manage other conditions that you have, such as diabetes or high blood pressure (hypertension).  Have blood tests to check your cholesterol levels at regular points in time as told by your health care provider.  Keep all follow-up visits as told by your health care provider. This is important. Where to find more information  American Heart Association: www.heart.org  National Heart, Lung, and Blood Institute: https://wilson-eaton.com/ Summary  High cholesterol increases your risk for heart disease and stroke. By keeping your cholesterol level low, you can reduce your risk for these conditions.  High cholesterol can often be prevented with diet and lifestyle changes.  Work with your health care provider to manage your risk factors, and have your blood tested regularly. This information is not intended to replace advice given to you by your health care provider. Make sure you discuss any questions you have with your health care provider. Document Revised: 01/07/2019 Document Reviewed: 01/07/2019 Elsevier Patient Education  2021 [place]. https://www.diabeteseducator.org/docs/default-source/living-with-diabetes/conquering-the-grocery-store-v1.pdf?sfvrsn=4">  Carbohydrate Counting for Diabetes Mellitus, Adult Carbohydrate counting is a method of keeping track of how many carbohydrates you eat. Eating  carbohydrates naturally increases the amount of sugar (glucose) in the blood. Counting how many carbohydrates you eat improves your blood glucose control, which helps you manage your diabetes. It is important to know how many carbohydrates you can safely have in each meal. This is different for every person. A dietitian can help you make a meal plan and calculate how many carbohydrates you should have at each meal and snack. What foods contain carbohydrates? Carbohydrates are found in the following foods:  Grains, such as breads and cereals.  Dried beans and soy products.  Starchy vegetables, such as potatoes, peas, and corn.  Fruit and fruit juices.  Milk and yogurt.  Sweets and snack foods, such as cake, cookies, candy, chips, and soft drinks.   How do I count carbohydrates in foods? There are two ways to count carbohydrates in food. You can read food labels or learn standard serving sizes of foods.  You can use either of the methods or a combination of both. Using the Nutrition Facts label The Nutrition Facts list is included on the labels of almost all packaged foods and beverages in the [place] It includes:  The serving size.  Information about nutrients in each serving, including the grams (g) of carbohydrate per serving. To use the Nutrition Facts:  Decide how many servings you will have.  Multiply the number of servings by the number of carbohydrates per serving.  The resulting number is the total amount of carbohydrates that you will be having. Learning the standard serving sizes of foods When you eat carbohydrate foods that are not packaged or do not include Nutrition Facts on the label, you need to measure the servings in order to count the amount of carbohydrates.  Measure the foods that you will eat with a food scale or measuring cup, if needed.  Decide how many standard-size servings you will eat.  Multiply the number of servings by 15. For foods that contain  carbohydrates, one serving equals 15 g of carbohydrates. ? For example, if you eat 2 cups or 10 oz (300 g) of strawberries, you will have eaten 2 servings and 30 g of carbohydrates (2 servings x 15 g = 30 g).  For foods that have more than one food mixed, such as soups and casseroles, you must count the carbohydrates in each food that is included. The following list contains standard serving sizes of common carbohydrate-rich foods. Each of these servings has about 15 g of carbohydrates:  1 slice of bread.  1 six-inch (15 cm) tortilla.  ? cup or 2 oz (53 g) cooked rice or pasta.   cup or 3 oz (85 g) cooked or canned, drained and rinsed beans or lentils.   cup or 3 oz (85 g) starchy vegetable, such as peas, corn, or squash.   cup or 4 oz (120 g) hot cereal.   cup or 3 oz (85 g) boiled or mashed potatoes, or  or 3 oz (85 g) of a large baked potato.   cup or 4 fl oz (118 mL) fruit juice.  1 cup or 8 fl oz (237 mL) milk.  1 small or 4 oz (106 g) apple.   or 2 oz (63 g) of a medium banana.  1 cup or 5 oz (150 g) strawberries.  3 cups or 1 oz (24 g) popped popcorn. What is an example of carbohydrate counting? To calculate the number of carbohydrates in this sample meal, follow the steps shown below. Sample meal  3 oz (85 g) chicken breast.  ? cup or 4 oz (106 g) brown rice.   cup or 3 oz (85 g) corn.  1 cup or 8 fl oz (237 mL) milk.  1 cup or 5 oz (150 g) strawberries with sugar-free whipped topping. Carbohydrate calculation 1. Identify the foods that contain carbohydrates: ? Rice. ? Corn. ? Milk. ? Strawberries. 2. Calculate how many servings you have of each food: ? 2 servings rice. ? 1 serving corn. ? 1 serving milk. ? 1 serving strawberries. 3. Multiply each number of servings by 15 g: ? 2 servings rice x 15 g = 30 g. ? 1 serving corn x 15 g = 15 g. ? 1 serving milk x 15 g = 15 g. ? 1 serving strawberries x 15 g = 15 g. 4. Add together all of the  amounts to find the total grams of carbohydrates eaten: ? 30 g +  15 g + 15 g + 15 g = 75 g of carbohydrates total. What are tips for following this plan? Shopping  Develop a meal plan and then make a shopping list.  Buy fresh and frozen vegetables, fresh and frozen fruit, dairy, eggs, beans, lentils, and whole grains.  Look at food labels. Choose foods that have more fiber and less sugar.  Avoid processed foods and foods with added sugars. Meal planning  Aim to have the same amount of carbohydrates at each meal and for each snack time.  Plan to have regular, balanced meals and snacks. Where to find more information  American Diabetes Association: www.diabetes.org  Centers for Disease Control and Prevention: http://www.wolf.info/ Summary  Carbohydrate counting is a method of keeping track of how many carbohydrates you eat.  Eating carbohydrates naturally increases the amount of sugar (glucose) in the blood.  Counting how many carbohydrates you eat improves your blood glucose control, which helps you manage your diabetes.  A dietitian can help you make a meal plan and calculate how many carbohydrates you should have at each meal and snack. This information is not intended to replace advice given to you by your health care provider. Make sure you discuss any questions you have with your health care provider. Document Revised: 03/27/2019 Document Reviewed: 03/28/2019 Elsevier Patient Education  2021 [employer].

## 2020-09-14 NOTE — Progress Notes (Signed)
Established Patient Office Visit  Subjective:  Patient ID: Kathy [name], female    DOB: Feb 18, 1966  Age: 55 y.o. MRN: [id number]  CC:  Chief Complaint  Patient presents with  . Follow-up  . Diabetes  . Hypertension    HPI Kathy [name] is a 55 y/o female who has history of Heat failure, Asthma, Hyperlipidemia, Heart Murmur,Hypertension, presents for routine follow up, lab review and medication refill. Her HgbA1c done on 09/09/20 decreased from 7% to 6.8%. She states that she's compliant with her medications and continues to make healthy lifestyle changes. She checks her fasting blood glucose daily,it ranges between 117- 134 mg/dl. She denies hypoglycemic/hyperglycemic symptoms, peripheral neuropathy and performs daily foot checks. She followed up with Cardiologist Dr Fletcher Anon on 08/31/20 and she will continue on her current treatment regimen. She reports that she continues to experience constant sharp 6/10 pain to her left shoulder which sometimes radiates to her left neck. She states that pain is chronic and she's yet to have MRI to her left shoulder done. Overall, she states that she's doing well and offers no further complaint.    Past Medical History:  Diagnosis Date  . 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 02/2016 Echo: EF 45%; b. 03/2016 Echo: EF 55%; c. 12/2019 Echo: EF 55-60%, no rwma, gr2 DD, nl RV size/fxn, mildly dil RA, mild MR.  . Asthma   . Chronic chest pain    a. ? vasospasm vs endothelial dysfxn. Improved w/ Ranexa but couldn't afford.  . 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 Presumed in setting of prior h/o MI w/ insignificant coronary dzs.  . Diabetes mellitus without complication ([place])   . Heart murmur   . History of NICM (nonischemic cardiomyopathy) ([place])    a. 02/2016 Echo: EF 45%; b. 12/2019 Echo: EF 55-60%.  . Hyperlipidemia   . Hypertension   . Myocardial infarction ([place])   . Non-obstructive Coronary artery disease    a. Prior  caths in 2007, 2011, 2012 - nonobs dzs; b. 03/2016 Cath ([facility]): LM nl, LAD/LCX/RCA insignificant dzs.  . Obesity   . Rate-dependent LBBB (left bundle branch block)   . Remote Tobacco abuse    a. 30+ pack year history - quit 2019.  . Seizures ([place])     Past Surgical History:  Procedure Laterality Date  . ANTERIOR CRUCIATE LIGAMENT REPAIR    . BACK SURGERY     L4 and L5  . CARDIAC CATHETERIZATION  04/09/2016  . COLONOSCOPY    . COLONOSCOPY WITH PROPOFOL N/A 08/04/2019   Procedure: COLONOSCOPY WITH PROPOFOL;  Surgeon: Lin Landsman, MD;  Location: [facility] ENDOSCOPY;  Service: Gastroenterology;  Laterality: N/A;  . EMPYEMA DRAINAGE     at age 48  . TONSILLECTOMY    . TONSILLECTOMY AND ADENOIDECTOMY  2008  . TUBAL LIGATION      Family History  Problem Relation Age of Onset  . Kidney disease Sister   . Diabetes Sister   . COPD Sister   . Stroke Sister   . Thyroid disease Sister   . Diabetes Mother   . Stroke Mother   . Heart attack Mother   . Thyroid disease Mother   . Colon cancer Mother   . Stroke Father   . Heart attack Father   . Asthma Daughter   . Hypertension Daughter   . Thyroid disease Daughter   . Asthma Son   . Heart disease Maternal Grandmother   .  Hypertension Maternal Grandmother   . Colon cancer Maternal Grandfather   . Hypertension Maternal Grandfather   . Hyperlipidemia Maternal Grandfather   . Stroke Paternal Grandmother   . Hypertension Paternal Grandfather   . Hyperlipidemia Paternal Grandfather   . Heart disease Paternal Grandfather   . Thyroid disease Sister   . Migraines Sister     Social History   Socioeconomic History  . Marital status: Divorced    Spouse name: Not on file  . Number of children: 2  . Years of education: pre-requisites   . Highest education level: GED or equivalent  Occupational History  . Occupation: unemployed  Tobacco Use  . Smoking status: Former Smoker    Packs/day: 1.00    Years: 30.00    Pack years: 30.00     Types: Cigarettes    Quit date: 08/16/2017    Years since quitting: 3.0  . Smokeless tobacco: Never Used  . Tobacco comment: Decreased smoking to less than 5 cigs/day  Vaping Use  . Vaping Use: Never used  Substance and Sexual Activity  . Alcohol use: Yes    Comment: occasional less than monthly  . Drug use: Not Currently    Comment: Previously used cocaine in the 1990s  . Sexual activity: Yes    Birth control/protection: Condom  Other Topics Concern  . Not on file  Social History Narrative   Currently on food stamps. They help somewhat but still difficult to buy food that she needs. Boyfriend helps pay rent with disability, they live together. Roof is leaking really badly. Lives in modular home. Social services bought tarp which helped, but it deteriorated from heat.    Social Determinants of Health   Financial Resource Strain: Not on file  Food Insecurity: No Food Insecurity  . Worried About Charity fundraiser in the Last Year: Never true  . Ran Out of Food in the Last Year: Never true  Transportation Needs: No Transportation Needs  . Lack of Transportation (Medical): No  . Lack of Transportation (Non-Medical): No  Physical Activity: Not on file  Stress: Not on file  Social Connections: Not on file  Intimate Partner Violence: Not on file    Outpatient Medications Prior to Visit  Medication Sig Dispense Refill  . blood glucose meter kit and supplies KIT Dispense based on patient and insurance preference. Use up to four times daily as directed. (FOR ICD-9 250.00, 250.01). 1 each 0  . diphenhydrAMINE (BENADRYL) 50 MG tablet Take 50 mg by mouth 2 (two) times daily.    Marland Kitchen EPINEPHrine 0.3 mg/0.3 mL IJ SOAJ injection Inject 0.3 mg into the muscle as needed for anaphylaxis. 1 each 0  . meclizine (ANTIVERT) 25 MG tablet Take 1 tablet (25 mg total) by mouth 3 (three) times daily as needed for dizziness. 90 tablet 6  . nitroGLYCERIN (NITROSTAT) 0.4 MG SL tablet 1 TABLET UNDER TONGUE  AS NEEDED FOR CHEST PAIN EVERY 5 MINUTES FOR MAX OF 3 DOSES IN 15 MINUTES; IF NO RELIEF AFTER 1ST DOSE CALL 911 25 tablet 0  . potassium chloride SA (KLOR-CON) 20 MEQ tablet Take 1 tablet (20 mEq total) by mouth 2 (two) times daily. 30 tablet 0  . Rightest GL300 Lancets MISC Use as directed 100 each 11  . RIGHTEST [id number] BLOOD GLUCOSE test strip AS DIRECTED 100 strip 99  . spironolactone (ALDACTONE) 25 MG tablet TAKE 1/2 TABLET BY MOUTH EVERY DAY 15 tablet 2  . torsemide (DEMADEX) 20 MG tablet Take 60  mg am and 20 mg pm qd.    . clopidogrel (PLAVIX) 75 MG tablet TAKE ONE TABLET BY MOUTH EVERY DAY 90 tablet 1  . isosorbide mononitrate (IMDUR) 60 MG 24 hr tablet TAKE ONE TABLET BY MOUTH TWICE DAILY 60 tablet 3  . metFORMIN (GLUCOPHAGE) 500 MG tablet TAKE ONE TABLET BY MOUTH 2 TIMES A DAY WITH MEALS 60 tablet 2  . metoprolol tartrate (LOPRESSOR) 25 MG tablet TAKE ONE TABLET BY MOUTH 2 TIMES A DAY 120 tablet 1  . pantoprazole (PROTONIX) 40 MG tablet TAKE ONE TABLET BY MOUTH EVERY DAY 90 tablet 1  . simvastatin (ZOCOR) 20 MG tablet Take 1 tablet (20 mg total) by mouth daily. 4 tablet 0  . metFORMIN (GLUCOPHAGE) 1000 MG tablet Take 1 tablet (1,000 mg total) by mouth 2 (two) times daily with a meal. 60 tablet 2   No facility-administered medications prior to visit.    Allergies  Allergen Reactions  . Aspirin Anaphylaxis  . Celebrex [Celecoxib] Anaphylaxis and Hives  . Fentanyl Anaphylaxis  . Latex Hives  . Lisinopril Shortness Of Breath  . Morphine Anaphylaxis  . Percocet [Oxycodone-Acetaminophen] Hives and Shortness Of Breath  . Simvastatin-High Dose Hives and Shortness Of Breath    Can tolerate 20 mg dose.   Jonah Blue [Metaxalone] Anaphylaxis  . Pork-Derived Metallurgist  . Vicodin [Hydrocodone-Acetaminophen] Hives and Other (See Comments)    Feels like her tongue swells  . Albuterol Palpitations    ROS Review of Systems  Constitutional: Negative.   Respiratory: Negative.    Cardiovascular: Negative.   Endocrine: Negative.   Musculoskeletal: Positive for arthralgias (left shoulder pain).  Neurological: Negative.   Hematological: Negative.   Psychiatric/Behavioral: Negative.       Objective:    Physical Exam HENT:     Head: Normocephalic and atraumatic.  Eyes:     Extraocular Movements: Extraocular movements intact.     Conjunctiva/sclera: Conjunctivae normal.     Pupils: Pupils are equal, round, and reactive to light.  Cardiovascular:     Rate and Rhythm: Normal rate and regular rhythm.     Pulses: Normal pulses.     Heart sounds: Normal heart sounds.  Pulmonary:     Effort: Pulmonary effort is normal.     Breath sounds: Normal breath sounds.  Skin:    General: Skin is warm.  Neurological:     General: No focal deficit present.     Mental Status: She is alert and oriented to person, place, and time. Mental status is at baseline.  Psychiatric:        Mood and Affect: Mood normal.        Behavior: Behavior normal.        Thought Content: Thought content normal.        Judgment: Judgment normal.     BP (!) 127/94 (BP Location: Left Arm, Patient Position: Sitting, Cuff Size: Large)   Pulse 84   Temp (!) 97.2 F (36.2 C)   Resp 18   Ht $R'5\' 7"'oz$  (1.702 m)   Wt (!) 302 lb (137 kg)   LMP 06/09/2018   SpO2 97%   BMI 47.30 kg/m  Wt Readings from Last 3 Encounters:  09/14/20 (!) 302 lb (137 kg)  08/31/20 (!) 301 lb (136.5 kg)  07/21/20 (!) 304 lb 6 oz (138.1 kg)   Encouraged on weight loss   Health Maintenance Due  Topic Date Due  . COVID-19 Vaccine (1) Never done  . Pneumococcal Vaccine 0-64  Years old (1 of 4 - PCV13) Never done  . Hepatitis C Screening  Never done  . TETANUS/TDAP  Never done  . Zoster Vaccines- Shingrix (1 of 2) Never done    There are no preventive care reminders to display for this patient.  Lab Results  Component Value Date   TSH 1.770 10/04/2017   Lab Results  Component Value Date   WBC 6.6 06/21/2020    HGB 13.4 06/21/2020   HCT 41.1 06/21/2020   MCV 86.9 06/21/2020   PLT 312 06/21/2020   Lab Results  Component Value Date   NA 140 07/21/2020   K 3.4 (L) 07/21/2020   CO2 29 07/21/2020   GLUCOSE 143 (H) 07/21/2020   BUN 10 07/21/2020   CREATININE 0.77 07/21/2020   BILITOT 0.7 06/12/2020   ALKPHOS 107 06/12/2020   AST 78 (H) 06/12/2020   ALT 81 (H) 06/12/2020   PROT 7.4 06/12/2020   ALBUMIN 3.8 06/12/2020   CALCIUM 9.3 07/21/2020   ANIONGAP 11 07/21/2020   Lab Results  Component Value Date   CHOL 149 01/22/2019   Lab Results  Component Value Date   HDL 27 (L) 01/22/2019   Lab Results  Component Value Date   LDLCALC 93 01/22/2019   Lab Results  Component Value Date   TRIG 167 (H) 01/22/2019   Lab Results  Component Value Date   CHOLHDL 5.5 (H) 01/22/2019   Lab Results  Component Value Date   HGBA1C 6.8 (H) 09/09/2020      Assessment & Plan:   1. History of CHF (congestive heart failure) - She will continue on current medication. She was advised to notify clinic or go to the ED for hematuria, hematochezia and active bleeding. - clopidogrel (PLAVIX) 75 MG tablet; TAKE ONE TABLET BY MOUTH EVERY DAY  Dispense: 90 tablet; Refill: 1  2. Gastroesophageal reflux disease, unspecified whether esophagitis present - Her acid reflux is under control and she will continue on current medication. -Avoid spicy, fatty and fried food -Avoid sodas and sour juices -Avoid heavy meals -Avoid eating 4 hours before bedtime -Elevate head of bed at night - pantoprazole (PROTONIX) 40 MG tablet; TAKE ONE TABLET BY MOUTH EVERY DAY  Dispense: 90 tablet; Refill: 1  3. Mixed hyperlipidemia - She will continue on current medication, low fat/cholesterol diet and exercise as tolerated. - simvastatin (ZOCOR) 20 MG tablet; Take 1 tablet (20 mg total) by mouth daily.  Dispense: 90 tablet; Refill: 1 - Lipid panel; Future  4. Essential hypertension - Her blood pressure is under control,  she will continue on current medication, DASH diet and exercise as tolerated. - metoprolol tartrate (LOPRESSOR) 25 MG tablet; TAKE ONE TABLET BY MOUTH 2 TIMES A DAY  Dispense: 180 tablet; Refill: 1 - isosorbide mononitrate (IMDUR) 60 MG 24 hr tablet; TAKE ONE TABLET BY MOUTH TWICE DAILY  Dispense: 180 tablet; Refill: 1   5. Type 2 diabetes mellitus without complication, without long-term current use of insulin (HCC) -Her HgbA1c is improving, she will continue on current medication, low carb/non concentrated sweet diet and exercise as tolerated. - metFORMIN (GLUCOPHAGE) 500 MG tablet; TAKE ONE TABLET BY MOUTH 2 TIMES A DAY WITH MEALS  Dispense: 180 tablet; Refill: 1 - HgB A1c; Future  6. Left shoulder pain, unspecified chronicity - She was advised to call and schedule MRI tomorrow, and notify the clinic with any concerns.     Follow-up: Return in about 14 weeks (around 12/21/2020), or if symptoms worsen or fail  to improve.    Ramin Zoll Jerold Coombe, NP

## 2020-09-15 ENCOUNTER — Other Ambulatory Visit: Payer: Self-pay

## 2020-09-17 ENCOUNTER — Other Ambulatory Visit: Payer: Self-pay

## 2020-09-17 ENCOUNTER — Other Ambulatory Visit: Payer: Self-pay | Admitting: Family

## 2020-09-17 MED ORDER — SPIRONOLACTONE 25 MG PO TABS
ORAL_TABLET | Freq: Every day | ORAL | 5 refills | Status: DC
  Filled 2020-09-17: qty 45, 90d supply, fill #0

## 2020-09-17 MED ORDER — TORSEMIDE 20 MG PO TABS
ORAL_TABLET | ORAL | 3 refills | Status: DC
  Filled 2020-09-17: qty 120, 30d supply, fill #0
  Filled 2020-10-14: qty 120, 30d supply, fill #1

## 2020-09-17 MED ORDER — EPINEPHRINE 0.3 MG/0.3ML IJ SOAJ
INTRAMUSCULAR | 6 refills | Status: DC
  Filled 2020-09-17: qty 2, 90d supply, fill #0
  Filled 2020-09-20: qty 4, 60d supply, fill #0

## 2020-09-20 ENCOUNTER — Other Ambulatory Visit: Payer: Self-pay | Admitting: Family

## 2020-09-20 ENCOUNTER — Other Ambulatory Visit: Payer: Self-pay

## 2020-09-20 DIAGNOSIS — I10 Essential (primary) hypertension: Secondary | ICD-10-CM

## 2020-09-20 MED ORDER — POTASSIUM CHLORIDE CRYS ER 20 MEQ PO TBCR
20.0000 meq | EXTENDED_RELEASE_TABLET | Freq: Two times a day (BID) | ORAL | 3 refills | Status: DC
Start: 2020-09-20 — End: 2021-09-07
  Filled 2020-09-20: qty 180, 90d supply, fill #0
  Filled 2020-12-01 – 2020-12-15 (×2): qty 180, 90d supply, fill #1
  Filled 2021-03-15: qty 180, 90d supply, fill #2
  Filled 2021-06-09: qty 180, 90d supply, fill #3

## 2020-09-20 NOTE — Progress Notes (Signed)
Refilled potassium

## 2020-09-21 ENCOUNTER — Other Ambulatory Visit: Payer: Self-pay

## 2020-09-22 ENCOUNTER — Other Ambulatory Visit: Payer: Self-pay | Admitting: Gerontology

## 2020-09-22 DIAGNOSIS — F419 Anxiety disorder, unspecified: Secondary | ICD-10-CM

## 2020-09-22 MED ORDER — ALPRAZOLAM 0.25 MG PO TABS: 0.2500 mg | ORAL_TABLET | Freq: Once | ORAL | 0 refills | Status: AC

## 2020-09-23 ENCOUNTER — Ambulatory Visit: Payer: 59

## 2020-10-05 ENCOUNTER — Other Ambulatory Visit: Payer: Self-pay

## 2020-10-14 ENCOUNTER — Other Ambulatory Visit: Payer: Self-pay

## 2020-11-12 ENCOUNTER — Other Ambulatory Visit: Payer: Self-pay | Admitting: Family

## 2020-11-12 ENCOUNTER — Other Ambulatory Visit: Payer: Self-pay

## 2020-11-12 MED ORDER — SPIRONOLACTONE 25 MG PO TABS
ORAL_TABLET | Freq: Every day | ORAL | 3 refills | Status: DC
  Filled 2020-11-12 – 2020-12-01 (×2): qty 45, fill #0
  Filled 2020-12-15: qty 45, 90d supply, fill #0
  Filled 2021-03-15: qty 45, 90d supply, fill #1
  Filled 2021-06-09: qty 45, 90d supply, fill #2
  Filled 2021-09-01: qty 45, 90d supply, fill #3
  Filled 2021-09-01: qty 45, 90d supply, fill #0

## 2020-11-12 MED ORDER — TORSEMIDE 20 MG PO TABS
ORAL_TABLET | ORAL | 3 refills | Status: DC
  Filled 2020-11-12: qty 24, 6d supply, fill #0
  Filled 2020-11-12: qty 120, 30d supply, fill #0
  Filled 2020-12-15: qty 120, 30d supply, fill #1
  Filled 2021-01-13: qty 120, 30d supply, fill #2
  Filled 2021-02-13: qty 120, 30d supply, fill #3
  Filled 2021-03-15: qty 120, 30d supply, fill #4
  Filled 2021-04-12: qty 120, 30d supply, fill #5
  Filled 2021-05-12: qty 120, 30d supply, fill #6
  Filled 2021-06-06: qty 360, 90d supply, fill #7
  Filled 2021-09-01: qty 360, 90d supply, fill #8
  Filled 2021-09-01: qty 240, 60d supply, fill #0

## 2020-11-12 NOTE — Progress Notes (Signed)
Spironolactone/ torsemide RX sent to medication management clinic

## 2020-11-12 NOTE — Progress Notes (Deleted)
Patient ID: Kathy [name], female    DOB: [birth year], 54 y.o.   MRN: [id number]  Kathy [name] is a 55 y/o female with a history of asthma, CAD, hyperlipidemia, HTN, seizures, DM, previous tobacco use and chronic heart failure.   Echo report from 06/14/20 reviewed and showed an EF of 50-55% along with mild LVH. Echo report from 01/06/20 reviewed and showed an EF of 55-60% along with mild LVH and mild MR. Echo report from 02/27/17 reviewed and showed an EF of 55-60%.  Admitted 06/12/20 due to acute on chronic HF. Cardiology consult obtained. Initially given IV lasix with transition to oral diuretics. Discharged after 2 days.   She presents today for her follow up visit with a chief complaint of   Past Medical History:  Diagnosis Date   ([place]) heart failure with improved ejection fraction ([place])    a. 02/2016 Echo: EF 45%; b. 03/2016 Echo: EF 55%; c. 12/2019 Echo: EF 55-60%, no rwma, gr2 DD, nl RV size/fxn, mildly dil RA, mild MR.   Asthma    Chronic chest pain    a. ? vasospasm vs endothelial dysfxn. Improved w/ Ranexa but couldn't afford.   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 Presumed in setting of prior h/o MI w/ insignificant coronary dzs.   Diabetes mellitus without complication (HCC)    Heart murmur    History of NICM (nonischemic cardiomyopathy) ([place])    a. 02/2016 Echo: EF 45%; b. 12/2019 Echo: EF 55-60%.   Hyperlipidemia    Hypertension    Myocardial infarction [facility])    Non-obstructive Coronary artery disease    a. Prior caths in 2007, 2011, 2012 - nonobs dzs; b. 03/2016 Cath ([facility]): LM nl, LAD/LCX/RCA insignificant dzs.   Obesity    Rate-dependent LBBB (left bundle branch block)    Remote Tobacco abuse    a. 30+ pack year history - quit 2019.   Seizures ([place])    Past Surgical History:  Procedure Laterality Date   ANTERIOR CRUCIATE LIGAMENT REPAIR     BACK SURGERY     L4 and L5   CARDIAC CATHETERIZATION  04/09/2016   COLONOSCOPY     COLONOSCOPY WITH PROPOFOL N/A 08/04/2019    Procedure: COLONOSCOPY WITH PROPOFOL;  Surgeon: Lin Landsman, MD;  Location: [facility] ENDOSCOPY;  Service: Gastroenterology;  Laterality: N/A;   EMPYEMA DRAINAGE     at age 14   TONSILLECTOMY     TONSILLECTOMY AND ADENOIDECTOMY  2008   TUBAL LIGATION     Family History  Problem Relation Age of Onset   Kidney disease Sister    Diabetes Sister    COPD Sister    Stroke Sister    Thyroid disease Sister    Diabetes Mother    Stroke Mother    Heart attack Mother    Thyroid disease Mother    Colon cancer Mother    Stroke Father    Heart attack Father    Asthma Daughter    Hypertension Daughter    Thyroid disease Daughter    Asthma Son    Heart disease Maternal Grandmother    Hypertension Maternal Grandmother    Colon cancer Maternal Grandfather    Hypertension Maternal Grandfather    Hyperlipidemia Maternal Grandfather    Stroke Paternal Grandmother    Hypertension Paternal Grandfather    Hyperlipidemia Paternal Grandfather    Heart disease Paternal Grandfather    Thyroid disease Sister    Migraines Sister    Social History  Tobacco Use   Smoking status: Former    Packs/day: 1.00    Years: 30.00    Pack years: 30.00    Types: Cigarettes    Quit date: 08/16/2017    Years since quitting: 3.2   Smokeless tobacco: Never   Tobacco comments:    Decreased smoking to less than 5 cigs/day  Substance Use Topics   Alcohol use: Yes    Comment: occasional less than monthly   Allergies  Allergen Reactions   Aspirin Anaphylaxis   Celebrex [Celecoxib] Anaphylaxis and Hives   Fentanyl Anaphylaxis   Latex Hives   Lisinopril Shortness Of Breath   Morphine Anaphylaxis   Percocet [Oxycodone-Acetaminophen] Hives and Shortness Of Breath   Simvastatin-High Dose Hives and Shortness Of Breath    Can tolerate 20 mg dose.    Skelaxin [Metaxalone] Anaphylaxis   Pork-Derived Products Hives   Vicodin [Hydrocodone-Acetaminophen] Hives and Other (See Comments)    Feels like her tongue  swells   Albuterol Palpitations     Review of Systems  Constitutional:  Positive for fatigue ("very little"). Negative for appetite change.  HENT:  Negative for congestion, postnasal drip and sore throat.   Eyes: Negative.   Respiratory:  Negative for apnea, cough, chest tightness and shortness of breath.   Cardiovascular:  Negative for chest pain, palpitations and leg swelling.  Gastrointestinal:  Negative for abdominal distention and abdominal pain.  Endocrine: Negative.   Genitourinary: Negative.   Musculoskeletal:  Positive for arthralgias (left shoulder) and back pain. Negative for neck pain.  Skin:  Positive for rash (lower abdomen/ hands).  Allergic/Immunologic: Positive for food allergies (Had allergic reaction to pork rinds).  Neurological:  Negative for dizziness and light-headedness.  Hematological:  Negative for adenopathy. Does not bruise/bleed easily.  Psychiatric/Behavioral:  Positive for sleep disturbance (sleeping on 1 pillow). Negative for dysphoric mood. The patient is not nervous/anxious.      Physical Exam Vitals and nursing note reviewed.  Constitutional:      General: She is not in acute distress.    Appearance: Normal appearance. She is well-developed. She is not ill-appearing, toxic-appearing or diaphoretic.  HENT:     Head: Normocephalic and atraumatic.  Neck:     Vascular: No JVD.  Cardiovascular:     Rate and Rhythm: Normal rate and regular rhythm.  Pulmonary:     Effort: Pulmonary effort is normal. No respiratory distress.     Breath sounds: No wheezing, rhonchi or rales.  Abdominal:     Palpations: Abdomen is soft.     Tenderness: There is no abdominal tenderness.  Musculoskeletal:        General: No tenderness.     Cervical back: Neck supple.     Right lower leg: No tenderness. No edema.     Left lower leg: No tenderness. No edema.  Skin:    General: Skin is warm and dry.     Findings: Rash (lower abdomen) present.     Comments: Pealing  skin on right palm  Neurological:     General: No focal deficit present.     Mental Status: She is alert and oriented to person, place, and time.  Psychiatric:        Mood and Affect: Mood normal.        Behavior: Behavior normal.    Assessment & Plan:  1: Chronic heart failure with preserved ejection fraction with structural changes (mild LVH)- - NYHA class II - euvolemic today  - weighing daily; reminded to  call for an overnight weight gain of >2 pounds or a weekly weight gain of >5 pounds - weight 304.6 pounds since she was last here 4 months ago - has been following a daily 2L fluid intake along with closely following a '2000mg'$  sodium diet - saw cardiology Fletcher Anon) 08/31/20  - had shortness of breath with lisinopril; may not be a good candidate for entresto - BNP 06/12/20 was 45.8  2: HTN- - BP  - saw PCP at [facility] 09/14/20 - BMP 07/21/20 reviewed and showed sodium 140, potassium 3.4, creatinine 0.77 and GFR >60  3: DM- - A1c 09/09/20 was 6.8% - glucose at home today was    Patient did not bring her medications nor a list. Each medication was verbally reviewed with the patient and she was encouraged to bring the bottles to every visit to confirm accuracy of list.

## 2020-11-15 ENCOUNTER — Ambulatory Visit: Payer: Self-pay | Admitting: Family

## 2020-11-28 NOTE — Progress Notes (Signed)
Patient ID: Kathy [name], female    DOB: 1965/12/15, 55 y.o.   MRN: [id number]  Kathy [name] is a 55 y/o female with a history of asthma, CAD, hyperlipidemia, HTN, seizures, DM, previous tobacco use and chronic heart failure.   Echo report from 06/14/20 reviewed and showed an EF of 50-55% along with mild LVH. Echo report from 01/06/20 reviewed and showed an EF of 55-60% along with mild LVH and mild MR. Echo report from 02/27/17 reviewed and showed an EF of 55-60%.  Admitted 06/12/20 due to acute on chronic HF. Cardiology consult obtained. Initially given IV lasix with transition to oral diuretics. Discharged after 2 days.   She presents today for her follow up visit with a chief complaint of minimal fatigue upon moderate exertion. She describes this as chronic in nature having been present for several years. She has associated shortness of breath, difficulty sleeping and chronic pain along with this. She denies any abdominal distention, palpitations, pedal edema, chest pain, cough, dizziness or weight gain.   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 02/2016 Echo: EF 45%; b. 03/2016 Echo: EF 55%; c. 12/2019 Echo: EF 55-60%, no rwma, gr2 DD, nl RV size/fxn, mildly dil RA, mild MR.   Asthma    Chronic chest pain    a. ? vasospasm vs endothelial dysfxn. Improved w/ Ranexa but couldn't afford.   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 Presumed in setting of prior h/o MI w/ insignificant coronary dzs.   Diabetes mellitus without complication (HCC)    Heart murmur    History of NICM (nonischemic cardiomyopathy) ([employer])    a. 02/2016 Echo: EF 45%; b. 12/2019 Echo: EF 55-60%.   Hyperlipidemia    Hypertension    Myocardial infarction [facility])    Non-obstructive Coronary artery disease    a. Prior caths in 2007, 2011, 2012 - nonobs dzs; b. 03/2016 Cath ([facility]): LM nl, LAD/LCX/RCA insignificant dzs.   Obesity    Rate-dependent LBBB (left bundle  branch block)    Remote Tobacco abuse    a. 30+ pack year history - quit 2019.   Seizures ([place])    Past Surgical History:  Procedure Laterality Date   ANTERIOR CRUCIATE LIGAMENT REPAIR     BACK SURGERY     L4 and L5   CARDIAC CATHETERIZATION  04/09/2016   COLONOSCOPY     COLONOSCOPY WITH PROPOFOL N/A 08/04/2019   Procedure: COLONOSCOPY WITH PROPOFOL;  Surgeon: Lin Landsman, MD;  Location: [facility] ENDOSCOPY;  Service: Gastroenterology;  Laterality: N/A;   EMPYEMA DRAINAGE     at age 34   TONSILLECTOMY     TONSILLECTOMY AND ADENOIDECTOMY  2008   TUBAL LIGATION     Family History  Problem Relation Age of Onset   Kidney disease Sister    Diabetes Sister    COPD Sister    Stroke Sister    Thyroid disease Sister    Diabetes Mother    Stroke Mother    Heart attack Mother    Thyroid disease Mother    Colon cancer Mother    Stroke Father    Heart attack Father    Asthma Daughter    Hypertension Daughter    Thyroid disease Daughter    Asthma Son    Heart disease Maternal Grandmother    Hypertension Maternal Grandmother    Colon cancer Maternal Grandfather    Hypertension Maternal Grandfather  Hyperlipidemia Maternal Grandfather    Stroke Paternal Grandmother    Hypertension Paternal Grandfather    Hyperlipidemia Paternal Grandfather    Heart disease Paternal Grandfather    Thyroid disease Sister    Migraines Sister    Social History   Tobacco Use   Smoking status: Former    Packs/day: 1.00    Years: 30.00    Pack years: 30.00    Types: Cigarettes    Quit date: 08/16/2017    Years since quitting: 3.2   Smokeless tobacco: Never   Tobacco comments:    Decreased smoking to less than 5 cigs/day  Substance Use Topics   Alcohol use: Yes    Comment: occasional less than monthly   Allergies  Allergen Reactions   Aspirin Anaphylaxis   Celebrex [Celecoxib] Anaphylaxis and Hives   Fentanyl Anaphylaxis   Latex Hives   Lisinopril Shortness Of Breath   Morphine  Anaphylaxis   Percocet [Oxycodone-Acetaminophen] Hives and Shortness Of Breath   Simvastatin-High Dose Hives and Shortness Of Breath    Can tolerate 20 mg dose.    Skelaxin [Metaxalone] Anaphylaxis   Pork-Derived Products Hives   Vicodin [Hydrocodone-Acetaminophen] Hives and Other (See Comments)    Feels like her tongue swells   Albuterol Palpitations   Prior to Admission medications   Medication Sig Start Date End Date Taking? Authorizing Provider  blood glucose meter kit and supplies KIT Dispense based on patient and insurance preference. Use up to four times daily as directed. (FOR ICD-9 250.00, 250.01). 03/02/20  Yes Iloabachie, Chioma E, NP  clopidogrel (PLAVIX) 75 MG tablet TAKE ONE TABLET BY MOUTH EVERY DAY 09/14/20 09/14/21 Yes Iloabachie, Chioma E, NP  diphenhydrAMINE (BENADRYL) 50 MG tablet Take 50 mg by mouth 2 (two) times daily.   Yes [provider]  EPINEPHrine (EPIPEN 2-PAK) 0.3 mg/0.3 mL IJ SOAJ injection INJECT 0.3MG INTRAMUSCULARLY ONCE AS DIRECTED. 05/20/20  Yes Iloabachie, Chioma E, NP  EPINEPHrine 0.3 mg/0.3 mL IJ SOAJ injection Inject 0.3 mg into the muscle as needed for anaphylaxis. 01/01/20  Yes Iloabachie, Chioma E, NP  isosorbide mononitrate (IMDUR) 60 MG 24 hr tablet TAKE ONE TABLET BY MOUTH TWICE DAILY 09/14/20 09/14/21 Yes Iloabachie, Chioma E, NP  meclizine (ANTIVERT) 25 MG tablet Take 1 tablet (25 mg total) by mouth 3 (three) times daily as needed for dizziness. 12/16/15  Yes McGowan, Larene Beach A, PA-C  metFORMIN (GLUCOPHAGE) 500 MG tablet TAKE ONE TABLET BY MOUTH 2 TIMES A DAY WITH MEALS 09/14/20 09/14/21 Yes Iloabachie, Chioma E, NP  metoprolol tartrate (LOPRESSOR) 25 MG tablet TAKE ONE TABLET BY MOUTH 2 TIMES A DAY 09/14/20 09/14/21 Yes Iloabachie, Chioma E, NP  nitroGLYCERIN (NITROSTAT) 0.4 MG SL tablet 1 TABLET UNDER TONGUE AS NEEDED FOR CHEST PAIN EVERY 5 MINUTES FOR MAX OF 3 DOSES IN 15 MINUTES; IF NO RELIEF AFTER 1ST DOSE CALL 911 05/13/19  Yes McGowan, Shannon A, PA-C   pantoprazole (PROTONIX) 40 MG tablet TAKE ONE TABLET BY MOUTH EVERY DAY 09/14/20 09/14/21 Yes Iloabachie, Chioma E, NP  potassium chloride SA (KLOR-CON) 20 MEQ tablet Take 1 tablet (20 mEq total) by mouth 2 (two) times daily. 09/20/20  Yes Alisa Graff, FNP  Rightest GL300 Lancets MISC Use as directed 08/12/20  Yes Cammy Copa, [facility]  RIGHTEST GQ676 BLOOD GLUCOSE test strip AS DIRECTED 04/05/20 04/05/21 Yes   simvastatin (ZOCOR) 20 MG tablet Take 1 tablet (20 mg total) by mouth daily. 09/14/20  Yes Iloabachie, Chioma E, NP  spironolactone (ALDACTONE) 25  MG tablet TAKE 1/2 TABLET BY MOUTH EVERY DAY 11/12/20 05/08/21 Yes Shelita Steptoe, Aura Fey, FNP  torsemide (DEMADEX) 20 MG tablet Take 3 tablets (60 mg) by mouth in the morning and then take 1 tablet (20 mg) by mouth in the evening. 11/12/20  Yes Alisa Graff, FNP   Review of Systems  Constitutional:  Positive for fatigue ("very little"). Negative for appetite change.  HENT:  Negative for congestion, postnasal drip and sore throat.   Eyes: Negative.   Respiratory:  Positive for shortness of breath. Negative for apnea, cough and chest tightness.   Cardiovascular:  Negative for chest pain, palpitations and leg swelling.  Gastrointestinal:  Negative for abdominal distention and abdominal pain.  Endocrine: Negative.   Genitourinary: Negative.   Musculoskeletal:  Positive for arthralgias (left shoulder) and back pain. Negative for neck pain.  Skin: Negative.   Allergic/Immunologic: Positive for food allergies (Had allergic reaction to pork rinds).  Neurological:  Negative for dizziness and light-headedness.  Hematological:  Negative for adenopathy. Does not bruise/bleed easily.  Psychiatric/Behavioral:  Positive for sleep disturbance (sleeping on 1 pillow). Negative for dysphoric mood. The patient is not nervous/anxious.    Vitals:   11/30/20 1345  BP: 122/80  Pulse: 70  Resp: 18  SpO2: 98%  Weight: 298 lb (135.2 kg)  Height: _0  (1.702 m)   Wt  Readings from Last 3 Encounters:  11/30/20 298 lb (135.2 kg)  09/14/20 (!) 302 lb (137 kg)  08/31/20 (!) 301 lb (136.5 kg)   Lab Results  Component Value Date   CREATININE 0.77 07/21/2020   CREATININE 0.73 06/21/2020   CREATININE 0.65 06/14/2020   Physical Exam Vitals and nursing note reviewed.  Constitutional:      General: She is not in acute distress.    Appearance: Normal appearance. She is well-developed. She is not ill-appearing, toxic-appearing or diaphoretic.  HENT:     Head: Normocephalic and atraumatic.  Neck:     Vascular: No JVD.  Cardiovascular:     Rate and Rhythm: Normal rate and regular rhythm.  Pulmonary:     Effort: Pulmonary effort is normal. No respiratory distress.     Breath sounds: No wheezing, rhonchi or rales.  Abdominal:     Palpations: Abdomen is soft.     Tenderness: There is no abdominal tenderness.  Musculoskeletal:        General: No tenderness.     Cervical back: Neck supple.     Right lower leg: No tenderness. No edema.     Left lower leg: No tenderness. No edema.  Skin:    General: Skin is warm and dry.  Neurological:     General: No focal deficit present.     Mental Status: She is alert and oriented to person, place, and time.  Psychiatric:        Mood and Affect: Mood normal.        Behavior: Behavior normal.    Assessment & Plan:  1: Chronic heart failure with preserved ejection fraction with structural changes (mild LVH)- - NYHA class II - euvolemic today  - weighing daily; reminded to call for an overnight weight gain of >2 pounds or a weekly weight gain of >5 pounds - weight down 6 pounds since she was last here 4 months ago - has been following a daily 2L fluid intake along with closely following a 2025m sodium diet - saw cardiology ([facility] Anon 08/31/20 - had shortness of breath with lisinopril; may not be a good  candidate for entresto - BNP 06/12/20 was 45.8  2: HTN- - BP looks good today (122/80)  - saw PCP at [facility] 09/14/20 - BMP 07/21/20 reviewed and showed sodium 140, potassium 3.4, creatinine 0.77 and GFR >60  3: DM- - A1c 09/09/20 was 6.8% - glucose at home today was 103   Medication bottles reviewed.   Return in 6 months or sooner for any questions/problems before then.

## 2020-11-30 ENCOUNTER — Other Ambulatory Visit: Payer: Self-pay

## 2020-11-30 ENCOUNTER — Ambulatory Visit: Payer: Self-pay | Attending: Family | Admitting: Family

## 2020-11-30 ENCOUNTER — Encounter: Payer: Self-pay | Admitting: Family

## 2020-11-30 VITALS — BP 122/80 | HR 70 | Resp 18 | Ht 67.0 in | Wt 298.0 lb

## 2020-11-30 DIAGNOSIS — I2510 Atherosclerotic heart disease of native coronary artery without angina pectoris: Secondary | ICD-10-CM | POA: Insufficient documentation

## 2020-11-30 DIAGNOSIS — Z79899 Other long term (current) drug therapy: Secondary | ICD-10-CM | POA: Insufficient documentation

## 2020-11-30 DIAGNOSIS — Z87891 Personal history of nicotine dependence: Secondary | ICD-10-CM | POA: Insufficient documentation

## 2020-11-30 DIAGNOSIS — Z7984 Long term (current) use of oral hypoglycemic drugs: Secondary | ICD-10-CM | POA: Insufficient documentation

## 2020-11-30 DIAGNOSIS — E785 Hyperlipidemia, unspecified: Secondary | ICD-10-CM | POA: Insufficient documentation

## 2020-11-30 DIAGNOSIS — Z8249 Family history of ischemic heart disease and other diseases of the circulatory system: Secondary | ICD-10-CM | POA: Insufficient documentation

## 2020-11-30 DIAGNOSIS — I110 Hypertensive heart disease with heart failure: Secondary | ICD-10-CM | POA: Insufficient documentation

## 2020-11-30 DIAGNOSIS — I10 Essential (primary) hypertension: Secondary | ICD-10-CM

## 2020-11-30 DIAGNOSIS — Z7902 Long term (current) use of antithrombotics/antiplatelets: Secondary | ICD-10-CM | POA: Insufficient documentation

## 2020-11-30 DIAGNOSIS — I5032 Chronic diastolic (congestive) heart failure: Secondary | ICD-10-CM | POA: Insufficient documentation

## 2020-11-30 DIAGNOSIS — Z833 Family history of diabetes mellitus: Secondary | ICD-10-CM | POA: Insufficient documentation

## 2020-11-30 DIAGNOSIS — E119 Type 2 diabetes mellitus without complications: Secondary | ICD-10-CM | POA: Insufficient documentation

## 2020-11-30 DIAGNOSIS — Z886 Allergy status to analgesic agent status: Secondary | ICD-10-CM | POA: Insufficient documentation

## 2020-11-30 DIAGNOSIS — Z885 Allergy status to narcotic agent status: Secondary | ICD-10-CM | POA: Insufficient documentation

## 2020-11-30 NOTE — Patient Instructions (Signed)
Continue weighing daily and call for an overnight weight gain of > 2 pounds or a weekly weight gain of >5 pounds. 

## 2020-12-01 ENCOUNTER — Other Ambulatory Visit: Payer: Self-pay

## 2020-12-02 ENCOUNTER — Other Ambulatory Visit: Payer: Self-pay

## 2020-12-02 MED FILL — Glucose Blood Test Strip: 30 days supply | Qty: 100 | Fill #1 | Status: AC

## 2020-12-06 ENCOUNTER — Telehealth: Payer: Self-pay | Admitting: Pharmacist

## 2020-12-06 NOTE — Telephone Encounter (Signed)
12/06/2020 10:51:17 AM - Epi-Pen refill request faxed to [facility] - Monday, December 06, 2020 10:48 AM --This patient previously had insurance coverage with [employer]-per Inez Catalina this coverage was Terminated 09/07/2020 per Sherre Scarlet ID# [id number]. I have copied from [employer] and typed letter, also requesting a refill to be shipped to our office.  If they do not accept this, patient will have to provide [place] with a letter from [facility] stating terminated.

## 2020-12-10 ENCOUNTER — Other Ambulatory Visit: Payer: Self-pay

## 2020-12-15 ENCOUNTER — Other Ambulatory Visit: Payer: Self-pay

## 2020-12-15 DIAGNOSIS — E119 Type 2 diabetes mellitus without complications: Secondary | ICD-10-CM

## 2020-12-15 DIAGNOSIS — E782 Mixed hyperlipidemia: Secondary | ICD-10-CM

## 2020-12-16 LAB — LIPID PANEL
Chol/HDL Ratio: 6 ratio — ABNORMAL HIGH (ref 0.0–4.4)
Cholesterol, Total: 161 mg/dL (ref 100–199)
HDL: 27 mg/dL — ABNORMAL LOW (ref >39)
LDL Chol Calc (NIH): 100 mg/dL — ABNORMAL HIGH (ref 0–99)
Triglycerides: 192 mg/dL — ABNORMAL HIGH (ref 0–149)
VLDL Cholesterol Cal: 34 mg/dL (ref 5–40)

## 2020-12-16 LAB — HEMOGLOBIN A1C
Est. average glucose Bld gHb Est-mCnc: 146 mg/dL
Hgb A1c MFr Bld: 6.7 % — ABNORMAL HIGH (ref 4.8–5.6)

## 2020-12-21 ENCOUNTER — Other Ambulatory Visit: Payer: Self-pay

## 2020-12-21 ENCOUNTER — Ambulatory Visit: Payer: Self-pay | Admitting: Gerontology

## 2020-12-21 ENCOUNTER — Encounter: Payer: Self-pay | Admitting: Gerontology

## 2020-12-21 VITALS — BP 139/80 | HR 77 | Temp 98.1°F | Resp 18 | Ht 67.0 in | Wt 302.7 lb

## 2020-12-21 DIAGNOSIS — E119 Type 2 diabetes mellitus without complications: Secondary | ICD-10-CM

## 2020-12-21 DIAGNOSIS — R748 Abnormal levels of other serum enzymes: Secondary | ICD-10-CM

## 2020-12-21 DIAGNOSIS — E782 Mixed hyperlipidemia: Secondary | ICD-10-CM

## 2020-12-21 MED ORDER — SIMVASTATIN 20 MG PO TABS
20.0000 mg | ORAL_TABLET | Freq: Every day | ORAL | 1 refills | Status: DC
  Filled 2020-12-21 – 2021-03-10 (×2): qty 90, 90d supply, fill #0
  Filled 2021-06-06: qty 90, 90d supply, fill #1

## 2020-12-21 NOTE — Progress Notes (Signed)
Established Patient Office Visit  Subjective:  Patient ID: Kathy [name], female    DOB: 11-Aug-1965  Age: 55 y.o. MRN: [id number]  CC:  Chief Complaint  Patient presents with   Follow-up    Labs drawn 12/15/20   Diabetes    Patient has been checking her blood sugars at home. This morning it was 115 fasting. Patient did bring a log of her readings. Patient states she is taking medication as prescribed.   Hypertension    Patient denies taking her blood pressure at home, but does state that she is taking her medications as prescribed.    HPI Kathy [name] pis a 55 y/o female who has history of Heat failure, Asthma, Hyperlipidemia, Heart Murmur,Hypertension, presents for routine follow up, lab review and medication refill. Her HgbA1c done on 12/15/20 decreased from 6.8% to 6.7%.  She checks her fasting blood glucose daily and it's usually less than 130 mg/dl. Her reading was 115 mg/dl today. She denies hypo/hyperglycemic symptoms, peripheral neuropathy and performs daily foot checks. Her Triglycrides increased from 176 to 192 mg/dl, HDL was 27 mg/dl and LDL was 100 mg/dl. She states that she's compliant with her medications and continues to make healthy life style changes.  She was seen at the CHF clinic on 11/30/20 by Darylene Price FNP and she weighs herself daily. Overall, she states that she's doing well and offers no further complaint.  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 02/2016 Echo: EF 45%; b. 03/2016 Echo: EF 55%; c. 12/2019 Echo: EF 55-60%, no rwma, gr2 DD, nl RV size/fxn, mildly dil RA, mild MR.   Asthma    Chronic chest pain    a. ? vasospasm vs endothelial dysfxn. Improved w/ Ranexa but couldn't afford.   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 Presumed in setting of prior h/o MI w/ insignificant coronary dzs.   Diabetes mellitus without complication (HCC)    Heart murmur    History of NICM (nonischemic  cardiomyopathy) ([place])    a. 02/2016 Echo: EF 45%; b. 12/2019 Echo: EF 55-60%.   Hyperlipidemia    Hypertension    Myocardial infarction [facility])    Non-obstructive Coronary artery disease    a. Prior caths in 2007, 2011, 2012 - nonobs dzs; b. 03/2016 Cath ([facility]): LM nl, LAD/LCX/RCA insignificant dzs.   Obesity    Rate-dependent LBBB (left bundle branch block)    Remote Tobacco abuse    a. 30+ pack year history - quit 2019.   Seizures ([place])     Past Surgical History:  Procedure Laterality Date   ANTERIOR CRUCIATE LIGAMENT REPAIR     BACK SURGERY     L4 and L5   CARDIAC CATHETERIZATION  04/09/2016   COLONOSCOPY     COLONOSCOPY WITH PROPOFOL N/A 08/04/2019   Procedure: COLONOSCOPY WITH PROPOFOL;  Surgeon: Lin Landsman, MD;  Location: [facility] ENDOSCOPY;  Service: Gastroenterology;  Laterality: N/A;   EMPYEMA DRAINAGE     at age 89   TONSILLECTOMY     TONSILLECTOMY AND ADENOIDECTOMY  2008   TUBAL LIGATION      Family History  Problem Relation Age of Onset   Kidney disease Sister    Diabetes Sister    COPD Sister    Stroke Sister    Thyroid disease Sister    Diabetes Mother    Stroke Mother    Heart attack Mother    Thyroid  disease Mother    Colon cancer Mother    Stroke Father    Heart attack Father    Asthma Daughter    Hypertension Daughter    Thyroid disease Daughter    Asthma Son    Heart disease Maternal Grandmother    Hypertension Maternal Grandmother    Colon cancer Maternal Grandfather    Hypertension Maternal Grandfather    Hyperlipidemia Maternal Grandfather    Stroke Paternal Grandmother    Hypertension Paternal Grandfather    Hyperlipidemia Paternal Grandfather    Heart disease Paternal Grandfather    Thyroid disease Sister    Migraines Sister     Social History   Socioeconomic History   Marital status: Divorced    Spouse name: Not on file   Number of children: 2   Years of education: pre-requisites    Highest education level: GED or  equivalent  Occupational History   Occupation: unemployed  Tobacco Use   Smoking status: Former    Packs/day: 1.00    Years: 30.00    Pack years: 30.00    Types: Cigarettes    Quit date: 08/16/2017    Years since quitting: 3.3   Smokeless tobacco: Never  Vaping Use   Vaping Use: Never used  Substance and Sexual Activity   Alcohol use: Yes    Comment: occasional less than monthly   Drug use: Not Currently    Comment: Previously used cocaine in the 1990s   Sexual activity: Yes    Birth control/protection: Condom  Other Topics Concern   Not on file  Social History Narrative   Currently on food stamps. They help somewhat but still difficult to buy food that she needs. Boyfriend helps pay rent with disability, they live together. Roof is leaking really badly. Lives in modular home. Social services bought tarp which helped, but it deteriorated from heat.    Social Determinants of Health   Financial Resource Strain: Not on file  Food Insecurity: No Food Insecurity   Worried About Charity fundraiser in the Last Year: Never true   Ran Out of Food in the Last Year: Never true  Transportation Needs: No Transportation Needs   Lack of Transportation (Medical): No   Lack of Transportation (Non-Medical): No  Physical Activity: Not on file  Stress: Not on file  Social Connections: Not on file  Intimate Partner Violence: Not on file    Outpatient Medications Prior to Visit  Medication Sig Dispense Refill   blood glucose meter kit and supplies KIT Dispense based on patient and insurance preference. Use up to four times daily as directed. (FOR ICD-9 250.00, 250.01). 1 each 0   clopidogrel (PLAVIX) 75 MG tablet TAKE ONE TABLET BY MOUTH EVERY DAY 90 tablet 1   diphenhydrAMINE (BENADRYL) 50 MG tablet Take 50 mg by mouth 2 (two) times daily.     EPINEPHrine (EPIPEN 2-PAK) 0.3 mg/0.3 mL IJ SOAJ injection INJECT 0.3MG INTRAMUSCULARLY ONCE AS DIRECTED. 4 each 6   isosorbide mononitrate (IMDUR) 60  MG 24 hr tablet TAKE ONE TABLET BY MOUTH TWICE DAILY 180 tablet 1   metFORMIN (GLUCOPHAGE) 500 MG tablet TAKE ONE TABLET BY MOUTH 2 TIMES A DAY WITH MEALS 180 tablet 1   metoprolol tartrate (LOPRESSOR) 25 MG tablet TAKE ONE TABLET BY MOUTH 2 TIMES A DAY 180 tablet 1   Multiple Vitamins-Minerals (MULTIVITAMIN ADULT) CHEW Chew 2 tablets by mouth daily.     nitroGLYCERIN (NITROSTAT) 0.4 MG SL tablet 1 TABLET UNDER TONGUE  AS NEEDED FOR CHEST PAIN EVERY 5 MINUTES FOR MAX OF 3 DOSES IN 15 MINUTES; IF NO RELIEF AFTER 1ST DOSE CALL 911 25 tablet 0   pantoprazole (PROTONIX) 40 MG tablet TAKE ONE TABLET BY MOUTH EVERY DAY 90 tablet 1   potassium chloride SA (KLOR-CON) 20 MEQ tablet Take 1 tablet (20 mEq total) by mouth 2 (two) times daily. 180 tablet 3   Rightest GL300 Lancets MISC Use as directed 100 each 11   RIGHTEST GS550 BLOOD GLUCOSE test strip USE AS DIRECTED 100 strip 99   spironolactone (ALDACTONE) 25 MG tablet TAKE 1/2 TABLET BY MOUTH ONCE EVERY DAY. 45 tablet 3   torsemide (DEMADEX) 20 MG tablet Take 3 tablets (60 mg) by mouth in the morning and then take 1 tablet (20 mg) by mouth in the evening. 360 tablet 3   simvastatin (ZOCOR) 20 MG tablet Take 1 tablet (20 mg total) by mouth once daily. 90 tablet 1   EPINEPHrine 0.3 mg/0.3 mL IJ SOAJ injection Inject 0.3 mg into the muscle as needed for anaphylaxis. 1 each 0   meclizine (ANTIVERT) 25 MG tablet Take 1 tablet (25 mg total) by mouth 3 (three) times daily as needed for dizziness. 90 tablet 6   No facility-administered medications prior to visit.    Allergies  Allergen Reactions   Aspirin Anaphylaxis   Celebrex [Celecoxib] Anaphylaxis and Hives   Fentanyl Anaphylaxis   Latex Hives   Lisinopril Shortness Of Breath   Morphine Anaphylaxis   Percocet [Oxycodone-Acetaminophen] Hives and Shortness Of Breath   Simvastatin-High Dose Hives and Shortness Of Breath    Can tolerate 20 mg dose.    Skelaxin [Metaxalone] Anaphylaxis   Beef-Derived  Products Hives   Pork-Derived Products Hives   Vicodin [Hydrocodone-Acetaminophen] Hives and Other (See Comments)    Feels like her tongue swells   Albuterol Palpitations    ROS Review of Systems  Constitutional: Negative.   Eyes: Negative.   Respiratory: Negative.    Cardiovascular: Negative.   Endocrine: Negative.   Skin: Negative.   Neurological: Negative.      Objective:    Physical Exam HENT:     Head: Normocephalic and atraumatic.  Eyes:     Extraocular Movements: Extraocular movements intact.     Conjunctiva/sclera: Conjunctivae normal.     Pupils: Pupils are equal, round, and reactive to light.  Cardiovascular:     Rate and Rhythm: Normal rate and regular rhythm.     Pulses: Normal pulses.     Heart sounds: Normal heart sounds.  Pulmonary:     Effort: Pulmonary effort is normal.     Breath sounds: Normal breath sounds.  Skin:    General: Skin is warm.  Neurological:     General: No focal deficit present.     Mental Status: She is alert and oriented to person, place, and time. Mental status is at baseline.    BP 139/80 (BP Location: Right Arm, Patient Position: Sitting, Cuff Size: Large)   Pulse 77   Temp 98.1 F (36.7 C) (Oral)   Resp 18   Ht _0  (1.702 m)   Wt (!) 302 lb 11.2 oz (137.3 kg)   LMP 06/09/2018   SpO2 95%   BMI 47.41 kg/m  Wt Readings from Last 3 Encounters:  12/21/20 (!) 302 lb 11.2 oz (137.3 kg)  12/15/20 (!) 302 lb 9.6 oz (137.3 kg)  11/30/20 298 lb (135.2 kg)   Encouraged weight loss  Health Maintenance Due  Topic  Date Due   COVID-19 Vaccine (1) Never done   Hepatitis C Screening  Never done   TETANUS/TDAP  Never done   Zoster Vaccines- Shingrix (1 of 2) Never done   Pneumococcal Vaccine 66-13 Years old (2 - PCV) 06/12/2020   INFLUENZA VACCINE  11/08/2020    There are no preventive care reminders to display for this patient.  Lab Results  Component Value Date   TSH 1.770 10/04/2017   Lab Results  Component Value  Date   WBC 6.6 06/21/2020   HGB 13.4 06/21/2020   HCT 41.1 06/21/2020   MCV 86.9 06/21/2020   PLT 312 06/21/2020   Lab Results  Component Value Date   NA 140 07/21/2020   K 3.4 (L) 07/21/2020   CO2 29 07/21/2020   GLUCOSE 143 (H) 07/21/2020   BUN 10 07/21/2020   CREATININE 0.77 07/21/2020   BILITOT 0.7 06/12/2020   ALKPHOS 107 06/12/2020   AST 78 (H) 06/12/2020   ALT 81 (H) 06/12/2020   PROT 7.4 06/12/2020   ALBUMIN 3.8 06/12/2020   CALCIUM 9.3 07/21/2020   ANIONGAP 11 07/21/2020   Lab Results  Component Value Date   CHOL 161 12/15/2020   Lab Results  Component Value Date   HDL 27 (L) 12/15/2020   Lab Results  Component Value Date   LDLCALC 100 (H) 12/15/2020   Lab Results  Component Value Date   TRIG 192 (H) 12/15/2020   Lab Results  Component Value Date   CHOLHDL 6.0 (H) 12/15/2020   Lab Results  Component Value Date   HGBA1C 6.7 (H) 12/15/2020      Assessment & Plan:    1. Mixed hyperlipidemia -  Her LDL was 100 mg/dl and her goal should be less than 70 mg/dl. She will continue on current medication, low fat/cholesterol diet. - simvastatin (ZOCOR) 20 MG tablet; Take 1 tablet (20 mg total) by mouth once daily.  Dispense: 90 tablet; Refill: 1  2. Type 2 diabetes mellitus without complication, without long-term current use of insulin (HCC) - Her HgbA1c was 6.7%, she will continue on current medication, low carb/on concentrated sweet diet and exercise as tolerated. She will follow up with [facility] Endocrinology team.  3. Elevated liver enzymes - Her Liver enzymes done in March was elevated, will recheck labs. - Comp Met (CMET); Future - Comp Met (CMET)     Follow-up: Return in about 6 weeks (around 02/01/2021), or if symptoms worsen or fail to improve.    Fue Cervenka Jerold Coombe, NP

## 2020-12-22 LAB — COMPREHENSIVE METABOLIC PANEL WITH GFR
ALT: 43 IU/L — ABNORMAL HIGH (ref 0–32)
AST: 36 IU/L (ref 0–40)
Albumin/Globulin Ratio: 1.4 (ref 1.2–2.2)
Albumin: 4.3 g/dL (ref 3.8–4.9)
Alkaline Phosphatase: 129 IU/L — ABNORMAL HIGH (ref 44–121)
BUN/Creatinine Ratio: 13 (ref 9–23)
BUN: 10 mg/dL (ref 6–24)
Bilirubin Total: 0.3 mg/dL (ref 0.0–1.2)
CO2: 29 mmol/L (ref 20–29)
Calcium: 9.4 mg/dL (ref 8.7–10.2)
Chloride: 99 mmol/L (ref 96–106)
Creatinine, Ser: 0.77 mg/dL (ref 0.57–1.00)
Globulin, Total: 3.1 g/dL (ref 1.5–4.5)
Glucose: 125 mg/dL — ABNORMAL HIGH (ref 65–99)
Potassium: 4.7 mmol/L (ref 3.5–5.2)
Sodium: 142 mmol/L (ref 134–144)
Total Protein: 7.4 g/dL (ref 6.0–8.5)
eGFR: 92 mL/min/1.73 (ref >59)

## 2021-01-04 ENCOUNTER — Other Ambulatory Visit: Payer: Self-pay

## 2021-01-05 ENCOUNTER — Other Ambulatory Visit: Payer: Self-pay

## 2021-01-13 ENCOUNTER — Other Ambulatory Visit: Payer: Self-pay

## 2021-01-25 ENCOUNTER — Other Ambulatory Visit: Payer: Self-pay

## 2021-01-25 ENCOUNTER — Ambulatory Visit: Payer: Self-pay | Admitting: Endocrinology

## 2021-01-25 VITALS — BP 158/78 | Wt 301.4 lb

## 2021-01-25 DIAGNOSIS — E119 Type 2 diabetes mellitus without complications: Secondary | ICD-10-CM

## 2021-01-25 MED ORDER — TRULICITY 0.75 MG/0.5ML ~~LOC~~ SOAJ
0.7500 mg | SUBCUTANEOUS | 11 refills | Status: DC
  Filled 2021-01-25: qty 2, 28d supply, fill #0
  Filled 2021-02-13 – 2021-02-21 (×2): qty 2, 28d supply, fill #1

## 2021-01-25 NOTE — Patient Instructions (Signed)
[facility] [place] Endocrinology visit summary 01/25/2021  - You are doing a great job managing your diabetes with medication and lifestyle changes (diet).  -Medications for diabetes    -Continue Metformin 500 mg BID.    - Start Trulicity 2.42 mg injection once weekly.

## 2021-01-26 ENCOUNTER — Encounter: Payer: Self-pay | Admitting: Endocrinology

## 2021-01-26 ENCOUNTER — Other Ambulatory Visit: Payer: Self-pay | Admitting: Emergency Medicine

## 2021-01-26 ENCOUNTER — Other Ambulatory Visit: Payer: Self-pay

## 2021-01-26 DIAGNOSIS — E119 Type 2 diabetes mellitus without complications: Secondary | ICD-10-CM

## 2021-01-26 NOTE — Progress Notes (Signed)
New Diabetes Consultation [facility]     Patient ID: Kathy [name], female   DOB: 11-15-1965, 55 y.o.   MRN: [id number] Assessment:  Kathy [name] is a 55 y.o. female who is seen in consultation for Type 2 diabetes mellitus without complication, without long-term current use of insulin ([place]) [E11.9] at the request of [place], Chioma E, NP.  1. Type 2 diabetes mellitus without complication, without long-term current use of insulin [facility])     Endocrine Narrative Assessment:  Patient is a 55 y/o female with Q6ST without complication, CHF, HTN, and obesity who is currently at goal with DM treatment (HbA1c 6.7 on 09/07 and 6.8 on 06/02). She expressed interest in switching to a DM medication that would help her lose weight.  Plan:     DM/Obesity -Continue metformin 500 mg BID. -Start Trulicity 4.19 mg injection once weekly. -Follow up with PCP in 6 weeks. -Schedule 24-hour urine cortisol at her next PCP appointment. -Follow up with [facility] in 3 months for routine DM care. -Schedule routine DM labs in 3 months. -Refer to Ophthalmology for routine diabetic retinopathy screening.  Patient Instructions  [facility] Endocrinology visit summary 01/25/2021  - You are doing a great job managing your diabetes with medication and lifestyle changes (diet).  -Medications for diabetes    -Continue Metformin 500 mg BID.    - Start Trulicity 6.22 mg injection once weekly.   No orders of the defined types were placed in this encounter.  Subjective:  Patient is a 55 y/o female with W9NL without complication (diagnosed in November 2021), HTN, CAD with past MI, CHF, right hearing loss, and obesity. She presents to [facility] Endocrinology upon referral by her PCP for DM management. She reports that she measures her BG 1x/day in the AM, with fasting BG around 110s, rarely > 130. Patient brought a detailed BG log to her visit. She currently manages her DM with metformin 500 mg BID (down from 1,000 mg BID in March).  She does not report any side effects or issue managing her DM.  Patient reports that her weight is her biggest concern, endorsing associated knee/joint pain. She keeps detailed notes of her daily weight for the past few months; her weight average in low 300s lbs. She states that her weight last year was in the 200s lbs, and 314 lbs in March 2022 when she was hospitalized for fluid overload (CHF). She expressed interest in starting a DM medication that will help with weight loss (why her PCP referred her to [facility] Endocrinology).  She reports various instances of possible fractures as well as low back pain and soreness with increased weight. She denies any screening for and diagnosis of osteoporosis.  Patient reports being on a diet consisting of one meal per day, with an intake of 200 mg of sodium and 2,00 ml of fluid; she has been following this diet since March 2022. She states that her diet consists mostly of vegetables; chicken, [place], and fish. She reports a pork allergy. She states that she quit smoking on May 9th, 2019; she has smoked since ages 74 y/o, 55 y/o, and 55 y/o until 2019. She reports consuming alcohol socially and denies any recreational drug use. She states that she exercises mostly by running after a 54 y/o family member.    Review of Systems  Musculoskeletal:  Positive for back pain.       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  Kathy [name] family history includes Asthma in her daughter and son; COPD in her sister; Colon cancer in her maternal  grandfather and mother; Diabetes in her mother and sister; Heart attack in her father and mother; Heart disease in her maternal grandmother and paternal grandfather; Hyperlipidemia in her maternal grandfather and paternal grandfather; Hypertension in her daughter, maternal grandfather, maternal grandmother, and paternal grandfather; Kidney disease in her sister; Migraines in her sister; Stroke in her father, mother, paternal grandmother, and sister; Thyroid disease in her daughter, mother, sister, and sister.  Kathy [name]  reports that she quit smoking about 3 years ago. Her smoking use included cigarettes. She has a 30.00 pack-year smoking history. She has never used smokeless tobacco. She reports current alcohol use. She reports that she does not currently use drugs.   Current Outpatient Medications:    blood glucose meter kit and supplies KIT, Dispense based on patient and insurance preference. Use up to four times daily as directed. (FOR ICD-9 250.00, 250.01)., Disp: 1 each, Rfl: 0   clopidogrel (PLAVIX) 75 MG tablet, TAKE ONE TABLET BY MOUTH EVERY DAY, Disp: 90 tablet, Rfl: 1   diphenhydrAMINE (BENADRYL) 50 MG tablet, Take 50 mg by mouth 2 (two) times daily., Disp: , Rfl:    Dulaglutide (TRULICITY) 8.92 JJ/9.4RD SOPN, Inject 0.75 mg into the skin once a week., Disp: 2 mL, Rfl: 11   EPINEPHrine (EPIPEN 2-PAK) 0.3 mg/0.3 mL IJ SOAJ injection, INJECT 0.3MG INTRAMUSCULARLY ONCE AS DIRECTED., Disp: 4 each, Rfl: 6   isosorbide mononitrate (IMDUR) 60 MG 24 hr tablet, TAKE ONE TABLET BY MOUTH TWICE DAILY, Disp: 180 tablet, Rfl: 1   metFORMIN (GLUCOPHAGE) 500 MG tablet, TAKE ONE TABLET BY MOUTH 2 TIMES A DAY WITH MEALS, Disp: 180 tablet, Rfl: 1   metoprolol tartrate (LOPRESSOR) 25 MG tablet, TAKE ONE TABLET BY MOUTH 2 TIMES A DAY, Disp: 180 tablet, Rfl: 1   Multiple Vitamins-Minerals (MULTIVITAMIN ADULT) CHEW, Chew 2 tablets by mouth daily., Disp: , Rfl:    nitroGLYCERIN (NITROSTAT) 0.4 MG SL tablet,  1 TABLET UNDER TONGUE AS NEEDED FOR CHEST PAIN EVERY 5 MINUTES FOR MAX OF 3 DOSES IN 15 MINUTES; IF NO RELIEF AFTER 1ST DOSE CALL 911, Disp: 25 tablet, Rfl: 0   pantoprazole (PROTONIX) 40 MG tablet, TAKE ONE TABLET BY MOUTH EVERY DAY, Disp: 90 tablet, Rfl: 1   potassium chloride SA (KLOR-CON) 20 MEQ tablet, Take 1 tablet (20 mEq total) by mouth 2 (two) times daily., Disp: 180 tablet, Rfl: 3   Rightest GL300 Lancets MISC, Use as directed, Disp: 100 each, Rfl: 11   RIGHTEST GS550 BLOOD GLUCOSE test strip, USE AS DIRECTED, Disp: 100 strip, Rfl: 99   simvastatin (ZOCOR) 20 MG tablet, Take 1 tablet (20 mg total) by mouth once daily., Disp: 90 tablet, Rfl: 1   spironolactone (ALDACTONE) 25 MG tablet, TAKE 1/2 TABLET BY MOUTH ONCE EVERY DAY., Disp: 45 tablet, Rfl: 3   torsemide (DEMADEX) 20 MG tablet, Take 3 tablets (60 mg) by mouth in the morning and then take 1 tablet (20 mg) by mouth in the evening., Disp: 360 tablet, Rfl: 3  Allergies  Allergen Reactions   Aspirin Anaphylaxis   Celebrex [Celecoxib] Anaphylaxis and Hives  Fentanyl Anaphylaxis   Latex Hives   Lisinopril Shortness Of Breath   Morphine Anaphylaxis   Percocet [Oxycodone-Acetaminophen] Hives and Shortness Of Breath   Simvastatin-High Dose Hives and Shortness Of Breath    Can tolerate 20 mg dose.    Skelaxin [Metaxalone] Anaphylaxis   Beef-Derived Products Hives   Pork-Derived Products Hives   Vicodin [Hydrocodone-Acetaminophen] Hives and Other (See Comments)    Feels like her tongue swells   Albuterol Palpitations   Objective:    Physical Exam      Data/Results/Labs  : I have personally reviewed pertinent labs and imaging studies, if indicated,  with the patient in clinic today.  No results found for this or any previous visit (from the past 672 hour(s)).]  HC Readings from Last 3 Encounters:  No data found for [facility]    Wt Readings from Last 3 Encounters:  01/25/21 (!) 301 lb 6.4 oz (136.7 kg)  12/21/20 (!) 302 lb 11.2  oz (137.3 kg)  12/15/20 (!) 302 lb 9.6 oz (137.3 kg)

## 2021-01-27 ENCOUNTER — Other Ambulatory Visit: Payer: Self-pay

## 2021-01-27 NOTE — Addendum Note (Signed)
Addended by: Austin Miles on: 01/27/2021 12:04 AM   Modules accepted: Level of Service

## 2021-01-31 ENCOUNTER — Telehealth: Payer: Self-pay | Admitting: Pharmacist

## 2021-01-31 NOTE — Telephone Encounter (Signed)
01/31/2021 7:00:17 PM - Trulicity to patient & dr  -- Kathy [name] - Monday, January 31, 2021 1:52 PM --Received pharmacy printout for Trulicity Inject 0.75mg  into the skin once a week,  # 4 boxes. Mailing paperwork to patient to sign & return, putting provider portion in [facility] folder for [place] to sign.

## 2021-02-01 ENCOUNTER — Other Ambulatory Visit: Payer: Self-pay

## 2021-02-01 ENCOUNTER — Ambulatory Visit: Payer: Self-pay | Admitting: Gerontology

## 2021-02-01 ENCOUNTER — Encounter: Payer: Self-pay | Admitting: Gerontology

## 2021-02-01 VITALS — BP 133/84 | HR 76 | Temp 97.9°F | Resp 16 | Ht 67.0 in | Wt 294.1 lb

## 2021-02-01 DIAGNOSIS — Z8679 Personal history of other diseases of the circulatory system: Secondary | ICD-10-CM

## 2021-02-01 DIAGNOSIS — I152 Hypertension secondary to endocrine disorders: Secondary | ICD-10-CM

## 2021-02-01 DIAGNOSIS — I10 Essential (primary) hypertension: Secondary | ICD-10-CM

## 2021-02-01 DIAGNOSIS — E119 Type 2 diabetes mellitus without complications: Secondary | ICD-10-CM

## 2021-02-01 MED ORDER — CLOPIDOGREL BISULFATE 75 MG PO TABS
ORAL_TABLET | Freq: Every day | ORAL | 1 refills | Status: DC
  Filled 2021-02-01: qty 90, fill #0
  Filled 2021-04-01: qty 90, 90d supply, fill #0
  Filled 2021-06-30: qty 90, 90d supply, fill #1

## 2021-02-01 MED ORDER — ISOSORBIDE MONONITRATE ER 60 MG PO TB24
ORAL_TABLET | Freq: Two times a day (BID) | ORAL | 1 refills | Status: DC
  Filled 2021-02-01: qty 180, fill #0
  Filled 2021-02-13: qty 180, 90d supply, fill #0
  Filled 2021-05-12: qty 180, 90d supply, fill #1

## 2021-02-01 MED ORDER — METOPROLOL TARTRATE 25 MG PO TABS
ORAL_TABLET | Freq: Two times a day (BID) | ORAL | 1 refills | Status: DC
  Filled 2021-02-01: qty 180, fill #0
  Filled 2021-04-01: qty 180, 90d supply, fill #0
  Filled 2021-06-30: qty 180, 90d supply, fill #1

## 2021-02-01 MED ORDER — METFORMIN HCL 500 MG PO TABS
ORAL_TABLET | ORAL | 1 refills | Status: DC
  Filled 2021-02-01: qty 180, fill #0
  Filled 2021-04-01: qty 180, 90d supply, fill #0
  Filled 2021-06-30: qty 180, 90d supply, fill #1

## 2021-02-01 NOTE — Progress Notes (Signed)
Established Patient Office Visit  Subjective:  Patient ID: Kathy [name], female    DOB: 09/30/1965  Age: 55 y.o. MRN: [id number]  CC:  Chief Complaint  Patient presents with   Follow-up   Diabetes    Started Trulicity on 99/35/70. Patient needs order for 24 hour cortisol level per Endocrinology.   Hypertension    HPI Kathy [name] is a 55 y/o female who has history of Heat failure, Asthma, Hyperlipidemia, Heart Murmur,Hypertension, presents for routine follow up, lab review and medication refill. Her HgbA1c done on 12/15/20 decreased from 6.8% to 6.7%. She was seen by [facility] Endocrinology on 01/25/21, was started on Trulicity 1.77 mg weekly, she states that she's tolerating medication and denies side effects. She denies chest pain, palpitation, light headedness and shortness of breath. Overall, she states that she's doing well and offers no further complaint.  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)    a. 02/2016 Echo: EF 45%; b. 03/2016 Echo: EF 55%; c. 12/2019 Echo: EF 55-60%, no rwma, gr2 DD, nl RV size/fxn, mildly dil RA, mild MR.   Asthma    Chronic chest pain    a. ? vasospasm vs endothelial dysfxn. Improved w/ Ranexa but couldn't afford.   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 Presumed in setting of prior h/o MI w/ insignificant coronary dzs.   Diabetes mellitus without complication (HCC)    Heart murmur    History of NICM (nonischemic cardiomyopathy) ([place])    a. 02/2016 Echo: EF 45%; b. 12/2019 Echo: EF 55-60%.   Hyperlipidemia    Hypertension    Myocardial infarction [facility])    Non-obstructive Coronary artery disease    a. Prior caths in 2007, 2011, 2012 - nonobs dzs; b. 03/2016 Cath ([facility]): LM nl, LAD/LCX/RCA insignificant dzs.   Obesity    Rate-dependent LBBB (left bundle branch block)    Remote Tobacco abuse    a. 30+ pack year history - quit 2019.   Seizures ([place])     Past Surgical History:  Procedure  Laterality Date   ANTERIOR CRUCIATE LIGAMENT REPAIR     BACK SURGERY     L4 and L5   CARDIAC CATHETERIZATION  04/09/2016   COLONOSCOPY     COLONOSCOPY WITH PROPOFOL N/A 08/04/2019   Procedure: COLONOSCOPY WITH PROPOFOL;  Surgeon: Lin Landsman, MD;  Location: [facility] ENDOSCOPY;  Service: Gastroenterology;  Laterality: N/A;   EMPYEMA DRAINAGE     at age 35   TONSILLECTOMY     TONSILLECTOMY AND ADENOIDECTOMY  2008   TUBAL LIGATION      Family History  Problem Relation Age of Onset   Kidney disease Sister    Diabetes Sister    COPD Sister    Stroke Sister    Thyroid disease Sister    Diabetes Mother    Stroke Mother    Heart attack Mother    Thyroid disease Mother    Colon cancer Mother    Stroke Father    Heart attack Father    Asthma Daughter    Hypertension Daughter    Thyroid disease Daughter    Asthma Son    Heart disease Maternal Grandmother    Hypertension Maternal Grandmother    Colon cancer Maternal Grandfather    Hypertension Maternal Grandfather    Hyperlipidemia Maternal Grandfather    Stroke Paternal Grandmother    Hypertension Paternal Grandfather    Hyperlipidemia Paternal Merchant navy officer  Heart disease Paternal Grandfather    Thyroid disease Sister    Migraines Sister     Social History   Socioeconomic History   Marital status: Divorced    Spouse name: Not on file   Number of children: 2   Years of education: pre-requisites    Highest education level: GED or equivalent  Occupational History   Occupation: unemployed  Tobacco Use   Smoking status: Former    Packs/day: 1.00    Years: 30.00    Pack years: 30.00    Types: Cigarettes    Quit date: 08/16/2017    Years since quitting: 3.4   Smokeless tobacco: Never  Vaping Use   Vaping Use: Never used  Substance and Sexual Activity   Alcohol use: Yes    Comment: occasional less than monthly   Drug use: Not Currently    Comment: Previously used cocaine in the 1990s   Sexual activity: Yes     Birth control/protection: Condom  Other Topics Concern   Not on file  Social History Narrative   Currently on food stamps. They help somewhat but still difficult to buy food that she needs. Boyfriend helps pay rent with disability, they live together. Roof is leaking really badly. Lives in modular home. Social services bought tarp which helped, but it deteriorated from heat.    Social Determinants of Health   Financial Resource Strain: Not on file  Food Insecurity: No Food Insecurity   Worried About Charity fundraiser in the Last Year: Never true   Ran Out of Food in the Last Year: Never true  Transportation Needs: No Transportation Needs   Lack of Transportation (Medical): No   Lack of Transportation (Non-Medical): No  Physical Activity: Not on file  Stress: Not on file  Social Connections: Not on file  Intimate Partner Violence: Not on file    Outpatient Medications Prior to Visit  Medication Sig Dispense Refill   blood glucose meter kit and supplies KIT Dispense based on patient and insurance preference. Use up to four times daily as directed. (FOR ICD-9 250.00, 250.01). 1 each 0   dextromethorphan-guaiFENesin (MUCINEX DM) 30-600 MG 12hr tablet Take 1 tablet by mouth daily.     diphenhydrAMINE (BENADRYL) 50 MG tablet Take 50 mg by mouth 2 (two) times daily.     Dulaglutide (TRULICITY) 3.53 GD/9.2EQ SOPN Inject 0.75 mg into the skin once a week. 2 mL 11   EPINEPHrine (EPIPEN 2-PAK) 0.3 mg/0.3 mL IJ SOAJ injection INJECT 0.3MG INTRAMUSCULARLY ONCE AS DIRECTED. 4 each 6   Multiple Vitamins-Minerals (MULTIVITAMIN ADULT) CHEW Chew 2 tablets by mouth daily.     nitroGLYCERIN (NITROSTAT) 0.4 MG SL tablet 1 TABLET UNDER TONGUE AS NEEDED FOR CHEST PAIN EVERY 5 MINUTES FOR MAX OF 3 DOSES IN 15 MINUTES; IF NO RELIEF AFTER 1ST DOSE CALL 911 25 tablet 0   pantoprazole (PROTONIX) 40 MG tablet TAKE ONE TABLET BY MOUTH EVERY DAY 90 tablet 1   potassium chloride SA (KLOR-CON) 20 MEQ tablet Take 1  tablet (20 mEq total) by mouth 2 (two) times daily. 180 tablet 3   Rightest GL300 Lancets MISC Use as directed 100 each 11   RIGHTEST GS550 BLOOD GLUCOSE test strip USE AS DIRECTED 100 strip 99   simvastatin (ZOCOR) 20 MG tablet Take 1 tablet (20 mg total) by mouth once daily. 90 tablet 1   spironolactone (ALDACTONE) 25 MG tablet TAKE 1/2 TABLET BY MOUTH ONCE EVERY DAY. 45 tablet 3   torsemide (  DEMADEX) 20 MG tablet Take 3 tablets (60 mg) by mouth in the morning and then take 1 tablet (20 mg) by mouth in the evening. 360 tablet 3   clopidogrel (PLAVIX) 75 MG tablet TAKE ONE TABLET BY MOUTH EVERY DAY 90 tablet 1   isosorbide mononitrate (IMDUR) 60 MG 24 hr tablet TAKE ONE TABLET BY MOUTH TWICE DAILY 180 tablet 1   metFORMIN (GLUCOPHAGE) 500 MG tablet TAKE ONE TABLET BY MOUTH 2 TIMES A DAY WITH MEALS 180 tablet 1   metoprolol tartrate (LOPRESSOR) 25 MG tablet TAKE ONE TABLET BY MOUTH 2 TIMES A DAY 180 tablet 1   No facility-administered medications prior to visit.    Allergies  Allergen Reactions   Aspirin Anaphylaxis   Celebrex [Celecoxib] Anaphylaxis and Hives   Fentanyl Anaphylaxis   Latex Hives   Lisinopril Shortness Of Breath   Morphine Anaphylaxis   Percocet [Oxycodone-Acetaminophen] Hives and Shortness Of Breath   Simvastatin-High Dose Hives and Shortness Of Breath    Can tolerate 20 mg dose.    Skelaxin [Metaxalone] Anaphylaxis   Beef-Derived Products Hives   Pork-Derived Products Hives   Vicodin [Hydrocodone-Acetaminophen] Hives and Other (See Comments)    Feels like her tongue swells   Albuterol Palpitations    ROS Review of Systems  Constitutional: Negative.   Eyes: Negative.   Respiratory: Negative.    Cardiovascular: Negative.   Endocrine: Negative.   Neurological: Negative.   Hematological: Negative.   Psychiatric/Behavioral: Negative.       Objective:    Physical Exam HENT:     Head: Normocephalic and atraumatic.     Mouth/Throat:     Mouth: Mucous  membranes are moist.  Eyes:     Extraocular Movements: Extraocular movements intact.     Conjunctiva/sclera: Conjunctivae normal.     Pupils: Pupils are equal, round, and reactive to light.  Cardiovascular:     Rate and Rhythm: Normal rate and regular rhythm.     Pulses: Normal pulses.     Heart sounds: Normal heart sounds.  Pulmonary:     Effort: Pulmonary effort is normal.     Breath sounds: Normal breath sounds.  Skin:    General: Skin is warm.  Neurological:     General: No focal deficit present.     Mental Status: She is alert and oriented to person, place, and time. Mental status is at baseline.    BP 133/84 (BP Location: Right Arm, Patient Position: Sitting, Cuff Size: Large)   Pulse 76   Temp 97.9 F (36.6 C) (Oral)   Resp 16   Ht 5' 7"  (1.702 m)   Wt 294 lb 1.6 oz (133.4 kg)   LMP 06/09/2018   SpO2 95%   BMI 46.06 kg/m  Wt Readings from Last 3 Encounters:  02/01/21 294 lb 1.6 oz (133.4 kg)  01/25/21 (!) 301 lb 6.4 oz (136.7 kg)  12/21/20 (!) 302 lb 11.2 oz (137.3 kg)   She lost 7 pounds and was encouraged to continue her weight loss regimen.  Health Maintenance Due  Topic Date Due   COVID-19 Vaccine (1) Never done   Hepatitis C Screening  Never done   TETANUS/TDAP  Never done   Zoster Vaccines- Shingrix (1 of 2) Never done   Pneumococcal Vaccine 46-33 Years old (2 - PCV) 06/12/2020   INFLUENZA VACCINE  11/08/2020   URINE MICROALBUMIN  02/24/2021    There are no preventive care reminders to display for this patient.  Lab Results  Component  Value Date   TSH 1.770 10/04/2017   Lab Results  Component Value Date   WBC 6.6 06/21/2020   HGB 13.4 06/21/2020   HCT 41.1 06/21/2020   MCV 86.9 06/21/2020   PLT 312 06/21/2020   Lab Results  Component Value Date   NA 142 12/21/2020   K 4.7 12/21/2020   CO2 29 12/21/2020   GLUCOSE 125 (H) 12/21/2020   BUN 10 12/21/2020   CREATININE 0.77 12/21/2020   BILITOT 0.3 12/21/2020   ALKPHOS 129 (H) 12/21/2020    AST 36 12/21/2020   ALT 43 (H) 12/21/2020   PROT 7.4 12/21/2020   ALBUMIN 4.3 12/21/2020   CALCIUM 9.4 12/21/2020   ANIONGAP 11 07/21/2020   EGFR 92 12/21/2020   Lab Results  Component Value Date   CHOL 161 12/15/2020   Lab Results  Component Value Date   HDL 27 (L) 12/15/2020   Lab Results  Component Value Date   LDLCALC 100 (H) 12/15/2020   Lab Results  Component Value Date   TRIG 192 (H) 12/15/2020   Lab Results  Component Value Date   CHOLHDL 6.0 (H) 12/15/2020   Lab Results  Component Value Date   HGBA1C 6.7 (H) 12/15/2020      Assessment & Plan:    1. Type 2 diabetes mellitus without complication, without long-term current use of insulin (HCC) - Her HgbA1c is decreasing, she will continue on current medication, low carb/non concentrated sweet diet and exercise as tolerated. - metFORMIN (GLUCOPHAGE) 500 MG tablet; TAKE ONE TABLET BY MOUTH 2 TIMES A DAY WITH MEALS  Dispense: 180 tablet; Refill: 1 - Cortisol,free,24 hour urine w/creatinine; Future  2. History of CHF (congestive heart failure) - She will continue on current medication and advised to go to the ED for hematurai, hematochezia or active bleeding. - clopidogrel (PLAVIX) 75 MG tablet; TAKE ONE TABLET BY MOUTH EVERY DAY  Dispense: 90 tablet; Refill: 1  3. Hypertension associated with diabetes ([place]) - Her blood pressure is under control, will continue on current medication, DASH diet and exercise as tolerated. - isosorbide mononitrate (IMDUR) 60 MG 24 hr tablet; TAKE ONE TABLET BY MOUTH TWICE DAILY  Dispense: 180 tablet; Refill: 1 -- metoprolol tartrate (LOPRESSOR) 25 MG tablet; TAKE ONE TABLET BY MOUTH 2 TIMES A DAY  Dispense: 180 tablet; Refill: 1    Follow-up: Return in about 7 weeks (around 03/22/2021), or if symptoms worsen or fail to improve.    Needham Biggins Jerold Coombe, NP

## 2021-02-01 NOTE — Patient Instructions (Signed)
DASH Eating Plan DASH stands for Dietary Approaches to Stop Hypertension. The DASH eating plan is a healthy eating plan that has been shown to: Reduce high blood pressure (hypertension). Reduce your risk for type 2 diabetes, heart disease, and stroke. Help with weight loss. What are tips for following this plan? Reading food labels Check food labels for the amount of salt (sodium) per serving. Choose foods with less than 5 percent of the Daily Value of sodium. Generally, foods with less than 300 milligrams (mg) of sodium per serving fit into this eating plan. To find whole grains, look for the word "whole" as the first word in the ingredient list. Shopping Buy products labeled as "low-sodium" or "no salt added." Buy fresh foods. Avoid canned foods and pre-made or frozen meals. Cooking Avoid adding salt when cooking. Use salt-free seasonings or herbs instead of table salt or sea salt. Check with your health care provider or pharmacist before using salt substitutes. Do not fry foods. Cook foods using healthy methods such as baking, boiling, grilling, roasting, and broiling instead. Cook with heart-healthy oils, such as olive, canola, avocado, soybean, or sunflower oil. Meal planning  Eat a balanced diet that includes: 4 or more servings of fruits and 4 or more servings of vegetables each day. Try to fill one-half of your plate with fruits and vegetables. 6-8 servings of whole grains each day. Less than 6 oz (170 g) of lean meat, poultry, or fish each day. A 3-oz (85-g) serving of meat is about the same size as a deck of cards. One egg equals 1 oz (28 g). 2-3 servings of low-fat dairy each day. One serving is 1 cup (237 mL). 1 serving of nuts, seeds, or beans 5 times each week. 2-3 servings of heart-healthy fats. Healthy fats called omega-3 fatty acids are found in foods such as walnuts, flaxseeds, fortified milks, and eggs. These fats are also found in cold-water fish, such as sardines, salmon,  and mackerel. Limit how much you eat of: Canned or prepackaged foods. Food that is high in trans fat, such as some fried foods. Food that is high in saturated fat, such as fatty meat. Desserts and other sweets, sugary drinks, and other foods with added sugar. Full-fat dairy products. Do not salt foods before eating. Do not eat more than 4 egg yolks a week. Try to eat at least 2 vegetarian meals a week. Eat more home-cooked food and less restaurant, buffet, and fast food. Lifestyle When eating at a restaurant, ask that your food be prepared with less salt or no salt, if possible. If you drink alcohol: Limit how much you use to: 0-1 drink a day for women who are not pregnant. 0-2 drinks a day for men. Be aware of how much alcohol is in your drink. In the [place] one drink equals one 12 oz bottle of beer (355 mL), one 5 oz glass of wine (148 mL), or one 1 oz glass of hard liquor (44 mL). General information Avoid eating more than 2,300 mg of salt a day. If you have hypertension, you may need to reduce your sodium intake to 1,500 mg a day. Work with your health care provider to maintain a healthy body weight or to lose weight. Ask what an ideal weight is for you. Get at least 30 minutes of exercise that causes your heart to beat faster (aerobic exercise) most days of the week. Activities may include walking, swimming, or biking. Work with your health care provider or dietitian to   adjust your eating plan to your individual calorie needs. What foods should I eat? Fruits All fresh, dried, or frozen fruit. Canned fruit in natural juice (without added sugar). Vegetables Fresh or frozen vegetables (raw, steamed, roasted, or grilled). Low-sodium or reduced-sodium tomato and vegetable juice. Low-sodium or reduced-sodium tomato sauce and tomato paste. Low-sodium or reduced-sodium canned vegetables. Grains Whole-grain or whole-wheat bread. Whole-grain or whole-wheat pasta. Brown rice. Oatmeal. Quinoa.  Bulgur. Whole-grain and low-sodium cereals. Pita bread. Low-fat, low-sodium crackers. Whole-wheat flour tortillas. Meats and other proteins Skinless chicken or turkey. Ground chicken or turkey. Pork with fat trimmed off. Fish and seafood. Egg whites. Dried beans, peas, or lentils. Unsalted nuts, nut butters, and seeds. Unsalted canned beans. Lean cuts of beef with fat trimmed off. Low-sodium, lean precooked or cured meat, such as sausages or meat loaves. Dairy Low-fat (1%) or fat-free (skim) milk. Reduced-fat, low-fat, or fat-free cheeses. Nonfat, low-sodium ricotta or cottage cheese. Low-fat or nonfat yogurt. Low-fat, low-sodium cheese. Fats and oils Soft margarine without trans fats. Vegetable oil. Reduced-fat, low-fat, or light mayonnaise and salad dressings (reduced-sodium). Canola, safflower, olive, avocado, soybean, and sunflower oils. Avocado. Seasonings and condiments Herbs. Spices. Seasoning mixes without salt. Other foods Unsalted popcorn and pretzels. Fat-free sweets. The items listed above may not be a complete list of foods and beverages you can eat. Contact a dietitian for more information. What foods should I avoid? Fruits Canned fruit in a light or heavy syrup. Fried fruit. Fruit in cream or butter sauce. Vegetables Creamed or fried vegetables. Vegetables in a cheese sauce. Regular canned vegetables (not low-sodium or reduced-sodium). Regular canned tomato sauce and paste (not low-sodium or reduced-sodium). Regular tomato and vegetable juice (not low-sodium or reduced-sodium). Pickles. Olives. Grains Baked goods made with fat, such as croissants, muffins, or some breads. Dry pasta or rice meal packs. Meats and other proteins Fatty cuts of meat. Ribs. Fried meat. Bacon. Bologna, salami, and other precooked or cured meats, such as sausages or meat loaves. Fat from the back of a pig (fatback). Bratwurst. Salted nuts and seeds. Canned beans with added salt. Canned or smoked fish.  Whole eggs or egg yolks. Chicken or turkey with skin. Dairy Whole or 2% milk, cream, and half-and-half. Whole or full-fat cream cheese. Whole-fat or sweetened yogurt. Full-fat cheese. Nondairy creamers. Whipped toppings. Processed cheese and cheese spreads. Fats and oils Butter. Stick margarine. Lard. Shortening. Ghee. Bacon fat. Tropical oils, such as coconut, palm kernel, or palm oil. Seasonings and condiments Onion salt, garlic salt, seasoned salt, table salt, and sea salt. Worcestershire sauce. Tartar sauce. Barbecue sauce. Teriyaki sauce. Soy sauce, including reduced-sodium. Steak sauce. Canned and packaged gravies. Fish sauce. Oyster sauce. Cocktail sauce. Store-bought horseradish. Ketchup. Mustard. Meat flavorings and tenderizers. Bouillon cubes. Hot sauces. Pre-made or packaged marinades. Pre-made or packaged taco seasonings. Relishes. Regular salad dressings. Other foods Salted popcorn and pretzels. The items listed above may not be a complete list of foods and beverages you should avoid. Contact a dietitian for more information. Where to find more information National Heart, Lung, and Blood Institute: www.nhlbi.nih.gov American Heart Association: www.heart.org Academy of Nutrition and Dietetics: www.eatright.org National Kidney Foundation: www.kidney.org Summary The DASH eating plan is a healthy eating plan that has been shown to reduce high blood pressure (hypertension). It may also reduce your risk for type 2 diabetes, heart disease, and stroke. When on the DASH eating plan, aim to eat more fresh fruits and vegetables, whole grains, lean proteins, low-fat dairy, and heart-healthy fats. With the DASH   eating plan, you should limit salt (sodium) intake to 2,300 mg a day. If you have hypertension, you may need to reduce your sodium intake to 1,500 mg a day. Work with your health care provider or dietitian to adjust your eating plan to your individual calorie needs. This information is not  intended to replace advice given to you by your health care provider. Make sure you discuss any questions you have with your health care provider. Document Revised: 02/28/2019 Document Reviewed: 02/28/2019 Elsevier Patient Education  2022 [place] for Diabetes Mellitus, Adult Carbohydrate counting is a method of keeping track of how many carbohydrates you eat. Eating carbohydrates naturally increases the amount of sugar (glucose) in the blood. Counting how many carbohydrates you eat improves your blood glucose control, which helps you manage your diabetes. It is important to know how many carbohydrates you can safely have in each meal. This is different for every person. A dietitian can help you make a meal plan and calculate how many carbohydrates you should have at each meal and snack. What foods contain carbohydrates? Carbohydrates are found in the following foods: Grains, such as breads and cereals. Dried beans and soy products. Starchy vegetables, such as potatoes, peas, and corn. Fruit and fruit juices. Milk and yogurt. Sweets and snack foods, such as cake, cookies, candy, chips, and soft drinks. How do I count carbohydrates in foods? There are two ways to count carbohydrates in food. You can read food labels or learn standard serving sizes of foods. You can use either of the methods or a combination of both. Using the Nutrition Facts label The Nutrition Facts list is included on the labels of almost all packaged foods and beverages in the [place] It includes: The serving size. Information about nutrients in each serving, including the grams (g) of carbohydrate per serving. To use the Nutrition Facts: Decide how many servings you will have. Multiply the number of servings by the number of carbohydrates per serving. The resulting number is the total amount of carbohydrates that you will be having. Learning the standard serving sizes of foods When you eat  carbohydrate foods that are not packaged or do not include Nutrition Facts on the label, you need to measure the servings in order to count the amount of carbohydrates. Measure the foods that you will eat with a food scale or measuring cup, if needed. Decide how many standard-size servings you will eat. Multiply the number of servings by 15. For foods that contain carbohydrates, one serving equals 15 g of carbohydrates. For example, if you eat 2 cups or 10 oz (300 g) of strawberries, you will have eaten 2 servings and 30 g of carbohydrates (2 servings x 15 g = 30 g). For foods that have more than one food mixed, such as soups and casseroles, you must count the carbohydrates in each food that is included. The following list contains standard serving sizes of common carbohydrate-rich foods. Each of these servings has about 15 g of carbohydrates: 1 slice of bread. 1 six-inch (15 cm) tortilla. ? cup or 2 oz (53 g) cooked rice or pasta.  cup or 3 oz (85 g) cooked or canned, drained and rinsed beans or lentils.  cup or 3 oz (85 g) starchy vegetable, such as peas, corn, or squash.  cup or 4 oz (120 g) hot cereal.  cup or 3 oz (85 g) boiled or mashed potatoes, or  or 3 oz (85 g) of a large baked potato.  cup or 4 fl oz (118 mL) fruit juice. 1 cup or 8 fl oz (237 mL) milk. 1 small or 4 oz (106 g) apple.  or 2 oz (63 g) of a medium banana. 1 cup or 5 oz (150 g) strawberries. 3 cups or 1 oz (24 g) popped popcorn. What is an example of carbohydrate counting? To calculate the number of carbohydrates in this sample meal, follow the steps shown below. Sample meal 3 oz (85 g) chicken breast. ? cup or 4 oz (106 g) brown rice.  cup or 3 oz (85 g) corn. 1 cup or 8 fl oz (237 mL) milk. 1 cup or 5 oz (150 g) strawberries with sugar-free whipped topping. Carbohydrate calculation Identify the foods that contain carbohydrates: Rice. Corn. Milk. Strawberries. Calculate how many servings you have of  each food: 2 servings rice. 1 serving corn. 1 serving milk. 1 serving strawberries. Multiply each number of servings by 15 g: 2 servings rice x 15 g = 30 g. 1 serving corn x 15 g = 15 g. 1 serving milk x 15 g = 15 g. 1 serving strawberries x 15 g = 15 g. Add together all of the amounts to find the total grams of carbohydrates eaten: 30 g + 15 g + 15 g + 15 g = 75 g of carbohydrates total. What are tips for following this plan? Shopping Develop a meal plan and then make a shopping list. Buy fresh and frozen vegetables, fresh and frozen fruit, dairy, eggs, beans, lentils, and whole grains. Look at food labels. Choose foods that have more fiber and less sugar. Avoid processed foods and foods with added sugars. Meal planning Aim to have the same amount of carbohydrates at each meal and for each snack time. Plan to have regular, balanced meals and snacks. Where to find more information American Diabetes Association: www.diabetes.org Centers for Disease Control and Prevention: http://www.wolf.info/ Summary Carbohydrate counting is a method of keeping track of how many carbohydrates you eat. Eating carbohydrates naturally increases the amount of sugar (glucose) in the blood. Counting how many carbohydrates you eat improves your blood glucose control, which helps you manage your diabetes. A dietitian can help you make a meal plan and calculate how many carbohydrates you should have at each meal and snack. This information is not intended to replace advice given to you by your health care provider. Make sure you discuss any questions you have with your health care provider. Document Revised: 03/27/2019 Document Reviewed: 03/28/2019 Elsevier Patient Education  2021 [employer].

## 2021-02-02 ENCOUNTER — Telehealth: Payer: Self-pay | Admitting: Pharmacist

## 2021-02-02 NOTE — Telephone Encounter (Signed)
--   Elmer Picker - Wednesday, February 02, 2021 12:45 PM --I have received the signed provider portion of [employer] application for Trulicity--holding for patient to return her portion, mailed to patient 01/31/2021.

## 2021-02-10 ENCOUNTER — Telehealth: Payer: Self-pay | Admitting: Pharmacist

## 2021-02-10 NOTE — Telephone Encounter (Signed)
--   Kathy [name] - Thursday, February 10, 2021 9:51 AM --[employer] [employer] application for Trulicity Inject 0.75mg  pen once a week # 4 boxes.

## 2021-02-14 ENCOUNTER — Other Ambulatory Visit: Payer: Self-pay

## 2021-02-16 ENCOUNTER — Other Ambulatory Visit: Payer: Self-pay

## 2021-02-16 ENCOUNTER — Other Ambulatory Visit: Payer: Self-pay | Admitting: Gerontology

## 2021-02-16 DIAGNOSIS — E119 Type 2 diabetes mellitus without complications: Secondary | ICD-10-CM

## 2021-02-21 ENCOUNTER — Other Ambulatory Visit: Payer: Self-pay

## 2021-03-09 ENCOUNTER — Other Ambulatory Visit: Payer: Self-pay

## 2021-03-09 VITALS — BP 131/81 | HR 68 | Temp 98.1°F | Ht 67.0 in | Wt 291.2 lb

## 2021-03-09 DIAGNOSIS — E119 Type 2 diabetes mellitus without complications: Secondary | ICD-10-CM

## 2021-03-09 LAB — CREATININE, URINE, 24 HOUR
Creatinine, 24H Ur: 1445 mg/24 hr (ref 800–1800)
Creatinine, Urine: 53.5 mg/dL

## 2021-03-09 LAB — CORTISOL, URINE, FREE
Cortisol (Ur), Free: 16 ug/24 hr (ref 6–42)
Cortisol,F,ug/L,U: 6 ug/L

## 2021-03-09 LAB — SPECIMEN STATUS REPORT

## 2021-03-10 ENCOUNTER — Other Ambulatory Visit: Payer: Self-pay

## 2021-03-10 LAB — LIPID PANEL
Chol/HDL Ratio: 5.5 ratio — ABNORMAL HIGH (ref 0.0–4.4)
Cholesterol, Total: 153 mg/dL (ref 100–199)
HDL: 28 mg/dL — ABNORMAL LOW (ref >39)
LDL Chol Calc (NIH): 98 mg/dL (ref 0–99)
Triglycerides: 149 mg/dL (ref 0–149)
VLDL Cholesterol Cal: 27 mg/dL (ref 5–40)

## 2021-03-10 LAB — HEMOGLOBIN A1C
Est. average glucose Bld gHb Est-mCnc: 134 mg/dL
Hgb A1c MFr Bld: 6.3 % — ABNORMAL HIGH (ref 4.8–5.6)

## 2021-03-11 ENCOUNTER — Other Ambulatory Visit: Payer: Self-pay

## 2021-03-12 MED FILL — Glucose Blood Test Strip: 30 days supply | Qty: 100 | Fill #2 | Status: AC

## 2021-03-14 ENCOUNTER — Other Ambulatory Visit: Payer: Self-pay

## 2021-03-15 ENCOUNTER — Other Ambulatory Visit: Payer: Self-pay

## 2021-03-15 ENCOUNTER — Ambulatory Visit: Payer: Self-pay | Admitting: Gerontology

## 2021-03-15 ENCOUNTER — Encounter: Payer: Self-pay | Admitting: Gerontology

## 2021-03-15 VITALS — BP 117/81 | HR 76 | Temp 97.8°F | Resp 18 | Ht 67.0 in | Wt 289.5 lb

## 2021-03-15 DIAGNOSIS — K219 Gastro-esophageal reflux disease without esophagitis: Secondary | ICD-10-CM

## 2021-03-15 DIAGNOSIS — E119 Type 2 diabetes mellitus without complications: Secondary | ICD-10-CM

## 2021-03-15 MED ORDER — PANTOPRAZOLE SODIUM 40 MG PO TBEC
DELAYED_RELEASE_TABLET | Freq: Every day | ORAL | 1 refills | Status: DC
  Filled 2021-03-15: qty 90, fill #0
  Filled 2021-05-12: qty 90, 90d supply, fill #0

## 2021-03-15 NOTE — Progress Notes (Signed)
Established Patient Office Visit  Subjective:  Patient ID: Kathy [name], female    DOB: 07/28/1965  Age: 55 y.o. MRN: [id number]  CC:  Chief Complaint  Patient presents with   Follow-up    Labs drawn 03/09/21   Diabetes   Hypertension    HPI Kathy [name] is a 55 y/o female who has history of Heat failure, Asthma, Hyperlipidemia, Heart Murmur,Hypertension, presents for routine follow up, lab review and medication refill. Her HgbA1c done on 03/09/21, decreased from 6.7% to 6.3%, her HDL was 28 mg/dl, LDL was 98 mg/dl. She states that she's compliant with her medications, denies side effects and continues to make healthy lifestyle changes. Overall, she states that she's doing well and offers no further complaint.    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 02/2016 Echo: EF 45%; b. 03/2016 Echo: EF 55%; c. 12/2019 Echo: EF 55-60%, no rwma, gr2 DD, nl RV size/fxn, mildly dil RA, mild MR.   Asthma    Chronic chest pain    a. ? vasospasm vs endothelial dysfxn. Improved w/ Ranexa but couldn't afford.   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 Presumed in setting of prior h/o MI w/ insignificant coronary dzs.   Diabetes mellitus without complication (HCC)    Heart murmur    History of NICM (nonischemic cardiomyopathy) ([place])    a. 02/2016 Echo: EF 45%; b. 12/2019 Echo: EF 55-60%.   Hyperlipidemia    Hypertension    Myocardial infarction [facility])    Non-obstructive Coronary artery disease    a. Prior caths in 2007, 2011, 2012 - nonobs dzs; b. 03/2016 Cath ([facility]): LM nl, LAD/LCX/RCA insignificant dzs.   Obesity    Rate-dependent LBBB (left bundle branch block)    Remote Tobacco abuse    a. 30+ pack year history - quit 2019.   Seizures ([place])     Past Surgical History:  Procedure Laterality Date   ANTERIOR CRUCIATE LIGAMENT REPAIR     BACK SURGERY     L4 and L5   CARDIAC CATHETERIZATION  04/09/2016   COLONOSCOPY      COLONOSCOPY WITH PROPOFOL N/A 08/04/2019   Procedure: COLONOSCOPY WITH PROPOFOL;  Surgeon: Lin Landsman, MD;  Location: [facility] ENDOSCOPY;  Service: Gastroenterology;  Laterality: N/A;   EMPYEMA DRAINAGE     at age 22   TONSILLECTOMY     TONSILLECTOMY AND ADENOIDECTOMY  2008   TUBAL LIGATION      Family History  Problem Relation Age of Onset   Kidney disease Sister    Diabetes Sister    COPD Sister    Stroke Sister    Thyroid disease Sister    Diabetes Mother    Stroke Mother    Heart attack Mother    Thyroid disease Mother    Colon cancer Mother    Stroke Father    Heart attack Father    Asthma Daughter    Hypertension Daughter    Thyroid disease Daughter    Asthma Son    Heart disease Maternal Grandmother    Hypertension Maternal Grandmother    Colon cancer Maternal Grandfather    Hypertension Maternal Grandfather    Hyperlipidemia Maternal Grandfather    Stroke Paternal Grandmother    Hypertension Paternal Grandfather    Hyperlipidemia Paternal Grandfather    Heart disease Paternal Grandfather    Thyroid disease Sister    Migraines Sister  Social History   Socioeconomic History   Marital status: Divorced    Spouse name: Not on file   Number of children: 2   Years of education: pre-requisites    Highest education level: GED or equivalent  Occupational History   Occupation: unemployed  Tobacco Use   Smoking status: Former    Packs/day: 1.00    Years: 30.00    Pack years: 30.00    Types: Cigarettes    Quit date: 08/16/2017    Years since quitting: 3.5   Smokeless tobacco: Never  Vaping Use   Vaping Use: Never used  Substance and Sexual Activity   Alcohol use: Yes    Comment: occasional less than monthly   Drug use: Not Currently    Comment: Previously used cocaine in the 1990s   Sexual activity: Yes    Birth control/protection: Condom  Other Topics Concern   Not on file  Social History Narrative   Currently on food stamps. They help somewhat  but still difficult to buy food that she needs. Boyfriend helps pay rent with disability, they live together. Roof is leaking really badly. Lives in modular home. Social services bought tarp which helped, but it deteriorated from heat.    Social Determinants of Health   Financial Resource Strain: Not on file  Food Insecurity: No Food Insecurity   Worried About Charity fundraiser in the Last Year: Never true   Ran Out of Food in the Last Year: Never true  Transportation Needs: No Transportation Needs   Lack of Transportation (Medical): No   Lack of Transportation (Non-Medical): No  Physical Activity: Not on file  Stress: Not on file  Social Connections: Not on file  Intimate Partner Violence: Not on file    Outpatient Medications Prior to Visit  Medication Sig Dispense Refill   blood glucose meter kit and supplies KIT Dispense based on patient and insurance preference. Use up to four times daily as directed. (FOR ICD-9 250.00, 250.01). 1 each 0   clopidogrel (PLAVIX) 75 MG tablet TAKE ONE TABLET BY MOUTH EVERY DAY 90 tablet 1   dextromethorphan-guaiFENesin (MUCINEX DM) 30-600 MG 12hr tablet Take 1 tablet by mouth daily.     diphenhydrAMINE (BENADRYL) 50 MG tablet Take 50 mg by mouth 2 (two) times daily.     Dulaglutide (TRULICITY) 8.00 LK/9.1PH SOPN Inject 0.75 mg into the skin once a week. 2 mL 11   EPINEPHrine (EPIPEN 2-PAK) 0.3 mg/0.3 mL IJ SOAJ injection INJECT 0.3MG INTRAMUSCULARLY ONCE AS DIRECTED. 4 each 6   isosorbide mononitrate (IMDUR) 60 MG 24 hr tablet TAKE ONE TABLET BY MOUTH TWICE DAILY 180 tablet 1   metFORMIN (GLUCOPHAGE) 500 MG tablet TAKE ONE TABLET BY MOUTH 2 TIMES A DAY WITH MEALS 180 tablet 1   metoprolol tartrate (LOPRESSOR) 25 MG tablet TAKE ONE TABLET BY MOUTH 2 TIMES A DAY 180 tablet 1   Multiple Vitamins-Minerals (MULTIVITAMIN ADULT) CHEW Chew 2 tablets by mouth daily.     nitroGLYCERIN (NITROSTAT) 0.4 MG SL tablet 1 TABLET UNDER TONGUE AS NEEDED FOR CHEST PAIN  EVERY 5 MINUTES FOR MAX OF 3 DOSES IN 15 MINUTES; IF NO RELIEF AFTER 1ST DOSE CALL 911 25 tablet 0   potassium chloride SA (KLOR-CON M) 20 MEQ tablet Take 1 tablet (20 mEq total) by mouth 2 (two) times daily. 180 tablet 3   Rightest GL300 Lancets MISC Use as directed 100 each 11   RIGHTEST GS550 BLOOD GLUCOSE test strip USE AS DIRECTED 100  strip 99   simvastatin (ZOCOR) 20 MG tablet Take 1 tablet (20 mg total) by mouth once daily. 90 tablet 1   spironolactone (ALDACTONE) 25 MG tablet TAKE 1/2 TABLET BY MOUTH ONCE EVERY DAY. 45 tablet 3   torsemide (DEMADEX) 20 MG tablet Take 3 tablets (60 mg) by mouth in the morning and then take 1 tablet (20 mg) by mouth in the evening. 360 tablet 3   pantoprazole (PROTONIX) 40 MG tablet TAKE ONE TABLET BY MOUTH EVERY DAY 90 tablet 1   No facility-administered medications prior to visit.    Allergies  Allergen Reactions   Aspirin Anaphylaxis   Celebrex [Celecoxib] Anaphylaxis and Hives   Fentanyl Anaphylaxis   Latex Hives   Lisinopril Shortness Of Breath   Morphine Anaphylaxis   Percocet [Oxycodone-Acetaminophen] Hives and Shortness Of Breath   Simvastatin-High Dose Hives and Shortness Of Breath    Can tolerate 20 mg dose.    Skelaxin [Metaxalone] Anaphylaxis   Beef-Derived Products Hives   Pork-Derived Products Hives   Vicodin [Hydrocodone-Acetaminophen] Hives and Other (See Comments)    Feels like her tongue swells   Albuterol Palpitations    ROS Review of Systems  Constitutional: Negative.   Eyes: Negative.   Respiratory: Negative.    Cardiovascular: Negative.   Endocrine: Negative.   Skin: Negative.   Neurological: Negative.   Hematological: Negative.      Objective:    Physical Exam HENT:     Head: Normocephalic and atraumatic.  Eyes:     Extraocular Movements: Extraocular movements intact.     Conjunctiva/sclera: Conjunctivae normal.     Pupils: Pupils are equal, round, and reactive to light.  Cardiovascular:     Rate and  Rhythm: Normal rate and regular rhythm.     Pulses: Normal pulses.     Heart sounds: Normal heart sounds.  Pulmonary:     Effort: Pulmonary effort is normal.     Breath sounds: Normal breath sounds.  Skin:    General: Skin is warm.  Neurological:     General: No focal deficit present.     Mental Status: She is alert and oriented to person, place, and time. Mental status is at baseline.  Psychiatric:        Mood and Affect: Mood normal.        Behavior: Behavior normal.        Thought Content: Thought content normal.        Judgment: Judgment normal.    BP 117/81 (BP Location: Left Arm, Patient Position: Sitting, Cuff Size: Large)   Pulse 76   Temp 97.8 F (36.6 C) (Oral)   Resp 18   Ht 5' 7"  (1.702 m)   Wt 289 lb 8 oz (131.3 kg)   LMP 06/09/2018   SpO2 95%   BMI 45.34 kg/m  Wt Readings from Last 3 Encounters:  03/15/21 289 lb 8 oz (131.3 kg)  03/09/21 291 lb 3.2 oz (132.1 kg)  02/01/21 294 lb 1.6 oz (133.4 kg)   She lost 5 pounds in 5 weeks, was encouraged to continue her weight loss regimen.  Health Maintenance Due  Topic Date Due   COVID-19 Vaccine (1) Never done   Hepatitis C Screening  Never done   TETANUS/TDAP  Never done   Zoster Vaccines- Shingrix (1 of 2) Never done   Pneumococcal Vaccine 65-55 Years old (2 - PCV) 06/12/2020   INFLUENZA VACCINE  11/08/2020   URINE MICROALBUMIN  02/24/2021    There are no  preventive care reminders to display for this patient.  Lab Results  Component Value Date   TSH 1.770 10/04/2017   Lab Results  Component Value Date   WBC 6.6 06/21/2020   HGB 13.4 06/21/2020   HCT 41.1 06/21/2020   MCV 86.9 06/21/2020   PLT 312 06/21/2020   Lab Results  Component Value Date   NA 142 12/21/2020   K 4.7 12/21/2020   CO2 29 12/21/2020   GLUCOSE 125 (H) 12/21/2020   BUN 10 12/21/2020   CREATININE 0.77 12/21/2020   BILITOT 0.3 12/21/2020   ALKPHOS 129 (H) 12/21/2020   AST 36 12/21/2020   ALT 43 (H) 12/21/2020   PROT 7.4  12/21/2020   ALBUMIN 4.3 12/21/2020   CALCIUM 9.4 12/21/2020   ANIONGAP 11 07/21/2020   EGFR 92 12/21/2020   Lab Results  Component Value Date   CHOL 153 03/09/2021   Lab Results  Component Value Date   HDL 28 (L) 03/09/2021   Lab Results  Component Value Date   LDLCALC 98 03/09/2021   Lab Results  Component Value Date   TRIG 149 03/09/2021   Lab Results  Component Value Date   CHOLHDL 5.5 (H) 03/09/2021   Lab Results  Component Value Date   HGBA1C 6.3 (H) 03/09/2021      Assessment & Plan:      1. Gastroesophageal reflux disease, unspecified whether esophagitis present -Her acid reflux is under control, she will continue on current medication -Avoid spicy, fatty and fried food -Avoid sodas and sour juices -Avoid heavy meals -Avoid eating 4 hours before bedtime -Elevate head of bed at night - pantoprazole (PROTONIX) 40 MG tablet; TAKE ONE TABLET BY MOUTH ONCE EVERY DAY  Dispense: 90 tablet; Refill: 1  2. Type 2 diabetes mellitus without complication, without long-term current use of insulin (HCC) - Her HgbA1c was 6.3%, she will continue her current medication, advised to continue on low carb/non concentrated sweet diet and exercise as tolerated. - Urine Microalbumin w/creat. ratio; Future - Urine Microalbumin w/creat. ratio   Follow-up: Return in about 12 weeks (around 06/07/2021), or if symptoms worsen or fail to improve.    Aroldo Galli Jerold Coombe, NP

## 2021-03-15 NOTE — Patient Instructions (Signed)

## 2021-03-16 LAB — MICROALBUMIN / CREATININE URINE RATIO
Creatinine, Urine: 19.4 mg/dL
Microalb/Creat Ratio: <15 mg/g creat (ref 0–29)
Microalbumin, Urine: <3 ug/mL

## 2021-03-22 ENCOUNTER — Ambulatory Visit: Payer: Self-pay | Admitting: Gerontology

## 2021-03-28 ENCOUNTER — Other Ambulatory Visit: Payer: Self-pay

## 2021-04-01 ENCOUNTER — Other Ambulatory Visit: Payer: Self-pay

## 2021-04-12 ENCOUNTER — Other Ambulatory Visit: Payer: Self-pay

## 2021-04-20 ENCOUNTER — Other Ambulatory Visit: Payer: Self-pay

## 2021-04-26 ENCOUNTER — Ambulatory Visit: Payer: Self-pay | Admitting: "Endocrinology

## 2021-04-26 ENCOUNTER — Other Ambulatory Visit: Payer: Self-pay

## 2021-04-26 DIAGNOSIS — E119 Type 2 diabetes mellitus without complications: Secondary | ICD-10-CM

## 2021-04-26 MED ORDER — TRULICITY 1.5 MG/0.5ML ~~LOC~~ SOAJ
1.5000 mg | SUBCUTANEOUS | 12 refills | Status: DC
  Filled 2021-04-26: qty 2, 28d supply, fill #0
  Filled 2021-05-12 – 2021-05-20 (×2): qty 2, 28d supply, fill #1
  Filled 2021-06-20: qty 2, 28d supply, fill #2
  Filled 2021-06-20: qty 8, 112d supply, fill #2
  Filled 2021-07-19: qty 8, 112d supply, fill #3

## 2021-04-26 NOTE — Patient Instructions (Addendum)
It was so nice to see you today!  Increase Trulicity to 1.5mg  a week Any stomach pain,vomiting or severe nausea, stop taking Trulicity immediately and let us know. Follow up with [place] in 6 months

## 2021-04-26 NOTE — Progress Notes (Signed)
Follow up Diabetes/ Endocrine [facility]     Patient ID: Kathy [name], female   DOB: Sep 06, 1965, 56 y.o.   MRN: [id number] Assessment/Plan:  Kathy [name] is a 56 y.o. female with a PMH of GERD, CHF, HTN and obesity who is seen in follow up for T2DM care management at the request of [place], Chioma E, NP.  Encounter Diagnoses 1. Type 2 diabetes mellitus without complication, without long-term current use of insulin (HCC)    The patient is doing exceptionally well with a decreased HbA1c (6.7% to 6.3%) and weight loss through tracking her progress, adherence to medications and a well balanced diet. To further optimize this regimen, her Trulicity was increased to 1.53m weekly and she was advised to stop taking this drug if she has any stomach pain/vomiting/nausea. A SGLT2 inhibitor was not prescribed because we did not see recent follow up of her CHF. We will reach out to Kathy [name] discuss incorporating SGLT2 inhibitors into her treatment regimen safely. Because of her progress, we would like to follow up in 6 months to see the effect of increasing Trulicity on her diabetes management.  Patient Instructions  It was so nice to see you today!  Increase Trulicity to 12.6ZTa week Any stomach pain,vomiting or severe nausea, stop taking Trulicity immediately and let [place]. Follow up with [place] 6 months   No orders of the defined types were placed in this encounter.    Subjective:  [name] a 56year old female with PMH of GERD, CHF, HTN and obesity presenting for T2DM (diagnosed in November 2021) care follow up. Her HbA1c is on target at 6.3% on 03/09/21, decreased from 6.7% on 12/15/20. She has been checking her blood glucose each morning before meals, with her fasting blood glucose around 100 mg/dl. She does not measure her blood pressure at home regularly and attributes the high blood pressure at tonight's visit to the high salt seasoning on her dinner. Her diabetes management  medications consists of Trulicity (02.45YKper week) and Metformin (5051mBID). She has not missed any doses. Her weight has decreased from 301 (when she started Trulicity) to 28998his morning. After starting Trulicity, she reports increased hunger 3/4 days after her dose and cravings for atypical food that increase until the next dose. She does not report GI issues with Metformin.  She denies numbness/tingling but has noticed changes in vision. She has an appointment book with opthalmology next month, but [facility]  She denies feelings of fatigue or fainting, but does report feeling nauseous randomly.   Her HF [facility] failing her cardiac stress test, leading to limiting strenuous exercise.   Her diet consists of a 200019modium and 2000m2mter restricted diet. She eats mainly salmon patties, vegetables, chicken and rice. She drinks water and no sugary drinks.     Review of Systems  Constitutional:  Negative for chills and fatigue.  Respiratory:  Negative for shortness of breath.   Cardiovascular:  Negative for chest pain.  Neurological:  Negative for weakness and numbness.   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  Family History, Social History, current Medications and allergies reviewed and updated in Epic.  She has two children Kathy [name] (36) and Kathy [name] (29). It is just her and her long term partner at home. She does not work and her partner is on  disability. She does not smoke (stopped in 2019), does not drink and does not do another drugs.  Objective:    Vitals:   04/26/21 1854  BP: (!) 169/93  Pulse: (!) 102  Temp: 98.6 F (37 C)  SpO2: 96%    Last menstrual period 06/09/2018. Physical Exam Constitutional:      Appearance: Normal appearance.  Cardiovascular:     Rate and Rhythm: Normal rate and regular rhythm.     Heart sounds: Normal heart sounds, S1 normal and S2 normal.  Pulmonary:     Effort: Pulmonary effort is normal.     Breath sounds: Normal breath sounds.  Musculoskeletal:     Right lower leg: No edema.     Left lower leg: No edema.  Feet:     Comments: Her bilateral feet showed intact sensation at all regions tested to a monofilament needle. Neurological:     Mental Status: She is alert.   Current Outpatient Medications on File Prior to Visit  Medication Sig Dispense Refill   blood glucose meter kit and supplies KIT Dispense based on patient and insurance preference. Use up to four times daily as directed. (FOR ICD-9 250.00, 250.01). 1 each 0   clopidogrel (PLAVIX) 75 MG tablet TAKE ONE TABLET BY MOUTH EVERY DAY 90 tablet 1   diphenhydrAMINE (BENADRYL) 50 MG tablet Take 50 mg by mouth 2 (two) times daily.     EPINEPHrine (EPIPEN 2-PAK) 0.3 mg/0.3 mL IJ SOAJ injection INJECT 0.3MG INTRAMUSCULARLY ONCE AS DIRECTED. 4 each 6   isosorbide mononitrate (IMDUR) 60 MG 24 hr tablet TAKE ONE TABLET BY MOUTH TWICE DAILY 180 tablet 1   metFORMIN (GLUCOPHAGE) 500 MG tablet TAKE ONE TABLET BY MOUTH 2 TIMES A DAY WITH MEALS 180 tablet 1   metoprolol tartrate (LOPRESSOR) 25 MG tablet TAKE ONE TABLET BY MOUTH 2 TIMES A DAY 180 tablet 1   Multiple Vitamins-Minerals (MULTIVITAMIN ADULT) CHEW Chew 2 tablets by mouth daily.     nitroGLYCERIN (NITROSTAT) 0.4 MG SL tablet 1 TABLET UNDER TONGUE AS NEEDED FOR CHEST PAIN EVERY 5 MINUTES FOR MAX OF 3 DOSES IN 15 MINUTES; IF NO RELIEF AFTER 1ST DOSE CALL 911 25 tablet 0    pantoprazole (PROTONIX) 40 MG tablet TAKE ONE TABLET BY MOUTH ONCE EVERY DAY 90 tablet 1   potassium chloride SA (KLOR-CON M) 20 MEQ tablet Take 1 tablet (20 mEq total) by mouth 2 (two) times daily. 180 tablet 3   Rightest GL300 Lancets MISC Use as directed 100 each 11   simvastatin (ZOCOR) 20 MG tablet Take 1 tablet (20 mg total) by mouth once daily. 90 tablet 1   spironolactone (ALDACTONE) 25 MG tablet TAKE 1/2 TABLET BY MOUTH ONCE EVERY DAY. 45 tablet 3   torsemide (DEMADEX) 20 MG tablet Take 3 tablets (60 mg) by mouth in the morning and then take 1 tablet (20 mg) by mouth in the evening. 360 tablet 3   dextromethorphan-guaiFENesin (MUCINEX DM) 30-600 MG 12hr tablet Take 1 tablet by mouth daily. (Patient not taking: Reported on 04/26/2021)     No current facility-administered medications on file prior to visit.    The 10-year ASCVD risk score (Arnett DK, et  al., 2019) is: 11.6%   Values used to calculate the score:     Age: 28 years     Sex: Female     Is Non-Hispanic African American: No     Diabetic: Yes     Tobacco smoker: No     Systolic Blood Pressure: 674 mmHg     Is BP treated: Yes     HDL Cholesterol: 28 mg/dL     Total Cholesterol: 153 mg/dL      Data : I have personally reviewed pertinent labs and imaging studies, if indicated,  with the patient in clinic today.   Lab Orders  No laboratory test(s) ordered today    HC Readings from Last 3 Encounters:  No data found for [facility]    Wt Readings from Last 3 Encounters:  04/26/21 288 lb (130.6 kg)  03/15/21 289 lb 8 oz (131.3 kg)  03/09/21 291 lb 3.2 oz (132.1 kg)

## 2021-04-27 ENCOUNTER — Other Ambulatory Visit: Payer: Self-pay

## 2021-04-28 NOTE — Progress Notes (Signed)
Attestation: I interviewed, examined, and discussed the patient with the student at the time of the visit.  I agree with the findings and plan documented.    Patient would also benefit from SGLT2i for heart failure.  Last admission for heart failure was 06/2020.  She may need adjustments in diuretics with starting this medication.  Per medication management, Wilder Glade is available currently, jardiance can be ordered but would take 4-6 weeks.  This can be added at next cardiology visit  Leslie Andrea MD Assistant Professor [facility] Endocrinology

## 2021-05-09 ENCOUNTER — Other Ambulatory Visit: Payer: Self-pay

## 2021-05-11 ENCOUNTER — Telehealth: Payer: Self-pay | Admitting: Pharmacist

## 2021-05-11 NOTE — Telephone Encounter (Signed)
05/11/2021 09:38:18 AM - Trulicity-Follow up call with [employer] -- Arletha Pili - Wednesday, May 11, 2021 10:11 AM --   Received printout to follow up on Trulicity. Called [employer] on hold for 41 min. spoke to [place]. PAP app was approved but order had not been released. She released order for shipment. Allow 7-10 business days.

## 2021-05-12 ENCOUNTER — Other Ambulatory Visit: Payer: Self-pay

## 2021-05-13 ENCOUNTER — Other Ambulatory Visit: Payer: Self-pay

## 2021-05-14 ENCOUNTER — Other Ambulatory Visit: Payer: Self-pay

## 2021-05-16 ENCOUNTER — Other Ambulatory Visit: Payer: Self-pay

## 2021-05-16 MED ORDER — RIGHTEST GS550 BLOOD GLUCOSE VI STRP
ORAL_STRIP | 11 refills | Status: DC
  Filled 2021-05-16: qty 50, 15d supply, fill #0
  Filled 2021-07-29: qty 100, 30d supply, fill #1
  Filled 2021-11-03: qty 100, 30d supply, fill #2
  Filled 2021-11-04: qty 100, 30d supply, fill #0
  Filled 2022-02-10: qty 100, 30d supply, fill #1

## 2021-05-18 ENCOUNTER — Other Ambulatory Visit: Payer: Self-pay

## 2021-05-19 ENCOUNTER — Telehealth: Payer: Self-pay | Admitting: Pharmacist

## 2021-05-19 ENCOUNTER — Other Ambulatory Visit: Payer: Self-pay

## 2021-05-19 NOTE — Telephone Encounter (Signed)
05/19/2021 67:25:50 AM - Trulicity forms to dr DOSE INCREASE -- Arletha Pili - Thursday, May 19, 2021 11:04 AM --  Receive printout from pharmacy for Trulicity 1.5mg  DOSE INCREASE. Forms in [facility] folder for provider to sign

## 2021-05-20 ENCOUNTER — Other Ambulatory Visit: Payer: Self-pay

## 2021-05-25 ENCOUNTER — Other Ambulatory Visit: Payer: Self-pay

## 2021-05-31 ENCOUNTER — Encounter: Payer: Self-pay | Admitting: Family

## 2021-05-31 ENCOUNTER — Ambulatory Visit: Payer: Self-pay | Attending: Family | Admitting: Family

## 2021-05-31 ENCOUNTER — Other Ambulatory Visit: Payer: Self-pay

## 2021-05-31 VITALS — BP 124/85 | HR 74 | Resp 14 | Ht 67.0 in | Wt 279.1 lb

## 2021-05-31 DIAGNOSIS — Z7985 Long-term (current) use of injectable non-insulin antidiabetic drugs: Secondary | ICD-10-CM | POA: Insufficient documentation

## 2021-05-31 DIAGNOSIS — I5032 Chronic diastolic (congestive) heart failure: Secondary | ICD-10-CM | POA: Insufficient documentation

## 2021-05-31 DIAGNOSIS — E119 Type 2 diabetes mellitus without complications: Secondary | ICD-10-CM | POA: Insufficient documentation

## 2021-05-31 DIAGNOSIS — R569 Unspecified convulsions: Secondary | ICD-10-CM | POA: Insufficient documentation

## 2021-05-31 DIAGNOSIS — Z888 Allergy status to other drugs, medicaments and biological substances status: Secondary | ICD-10-CM | POA: Insufficient documentation

## 2021-05-31 DIAGNOSIS — M25512 Pain in left shoulder: Secondary | ICD-10-CM | POA: Insufficient documentation

## 2021-05-31 DIAGNOSIS — M549 Dorsalgia, unspecified: Secondary | ICD-10-CM | POA: Insufficient documentation

## 2021-05-31 DIAGNOSIS — J45909 Unspecified asthma, uncomplicated: Secondary | ICD-10-CM | POA: Insufficient documentation

## 2021-05-31 DIAGNOSIS — E785 Hyperlipidemia, unspecified: Secondary | ICD-10-CM | POA: Insufficient documentation

## 2021-05-31 DIAGNOSIS — I2510 Atherosclerotic heart disease of native coronary artery without angina pectoris: Secondary | ICD-10-CM | POA: Insufficient documentation

## 2021-05-31 DIAGNOSIS — Z87891 Personal history of nicotine dependence: Secondary | ICD-10-CM | POA: Insufficient documentation

## 2021-05-31 DIAGNOSIS — G479 Sleep disorder, unspecified: Secondary | ICD-10-CM | POA: Insufficient documentation

## 2021-05-31 DIAGNOSIS — I110 Hypertensive heart disease with heart failure: Secondary | ICD-10-CM | POA: Insufficient documentation

## 2021-05-31 DIAGNOSIS — Z91018 Allergy to other foods: Secondary | ICD-10-CM | POA: Insufficient documentation

## 2021-05-31 DIAGNOSIS — I10 Essential (primary) hypertension: Secondary | ICD-10-CM

## 2021-05-31 NOTE — Progress Notes (Signed)
Patient ID: Kathy [name], female    DOB: 1966-03-08, 56 y.o.   MRN: [id number]  Kathy [name] is a 56 y/o female with a history of asthma, CAD, hyperlipidemia, HTN, seizures, DM, previous tobacco use and chronic heart failure.   Echo report from 06/14/20 reviewed and showed an EF of 50-55% along with mild LVH. Echo report from 01/06/20 reviewed and showed an EF of 55-60% along with mild LVH and mild MR. Echo report from 02/27/17 reviewed and showed an EF of 55-60%.  Has not been admitted or been in the ED in the last 6 months.   She presents today for her follow up visit with a chief complaint of minimal fatigue upon moderate exertion. She describes this as chronic in nature having been present for several years. She has associated shortness of breath, difficulty sleeping and chronic pain along with this. She denies any abdominal distention, palpitations, pedal edema, chest pain, cough, dizziness or weight gain.   She has been intentionally trying to lose weight, taking trulicity per endocrinology.   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 02/2016 Echo: EF 45%; b. 03/2016 Echo: EF 55%; c. 12/2019 Echo: EF 55-60%, no rwma, gr2 DD, nl RV size/fxn, mildly dil RA, mild MR.   Asthma    Chronic chest pain    a. ? vasospasm vs endothelial dysfxn. Improved w/ Ranexa but couldn't afford.   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 Presumed in setting of prior h/o MI w/ insignificant coronary dzs.   Diabetes mellitus without complication (HCC)    Heart murmur    History of NICM (nonischemic cardiomyopathy) ([place])    a. 02/2016 Echo: EF 45%; b. 12/2019 Echo: EF 55-60%.   Hyperlipidemia    Hypertension    Myocardial infarction [facility])    Non-obstructive Coronary artery disease    a. Prior caths in 2007, 2011, 2012 - nonobs dzs; b. 03/2016 Cath ([facility]): LM nl, LAD/LCX/RCA insignificant dzs.   Obesity    Rate-dependent LBBB (left bundle branch  block)    Remote Tobacco abuse    a. 30+ pack year history - quit 2019.   Seizures ([place])    Past Surgical History:  Procedure Laterality Date   ANTERIOR CRUCIATE LIGAMENT REPAIR     BACK SURGERY     L4 and L5   CARDIAC CATHETERIZATION  04/09/2016   COLONOSCOPY     COLONOSCOPY WITH PROPOFOL N/A 08/04/2019   Procedure: COLONOSCOPY WITH PROPOFOL;  Surgeon: Lin Landsman, MD;  Location: [facility] ENDOSCOPY;  Service: Gastroenterology;  Laterality: N/A;   EMPYEMA DRAINAGE     at age 18   TONSILLECTOMY     TONSILLECTOMY AND ADENOIDECTOMY  2008   TUBAL LIGATION     Family History  Problem Relation Age of Onset   Kidney disease Sister    Diabetes Sister    COPD Sister    Stroke Sister    Thyroid disease Sister    Diabetes Mother    Stroke Mother    Heart attack Mother    Thyroid disease Mother    Colon cancer Mother    Stroke Father    Heart attack Father    Asthma Daughter    Hypertension Daughter    Thyroid disease Daughter    Asthma Son    Heart disease Maternal Grandmother    Hypertension Maternal Grandmother    Colon cancer Maternal Grandfather    Hypertension  Maternal Grandfather    Hyperlipidemia Maternal Grandfather    Stroke Paternal Grandmother    Hypertension Paternal Grandfather    Hyperlipidemia Paternal Grandfather    Heart disease Paternal Grandfather    Thyroid disease Sister    Migraines Sister    Social History   Tobacco Use   Smoking status: Former    Packs/day: 1.00    Years: 30.00    Pack years: 30.00    Types: Cigarettes    Quit date: 08/16/2017    Years since quitting: 3.7   Smokeless tobacco: Never  Substance Use Topics   Alcohol use: Yes    Comment: occasional less than monthly   Allergies  Allergen Reactions   Aspirin Anaphylaxis    Occurred as a child   Celebrex [Celecoxib] Anaphylaxis and Hives   Fentanyl Anaphylaxis   Gabapentin Shortness Of Breath   Latex Hives   Lisinopril Shortness Of Breath   Morphine Anaphylaxis    Percocet [Oxycodone-Acetaminophen] Hives and Shortness Of Breath   Simvastatin-High Dose Hives and Shortness Of Breath    Can tolerate 20 mg dose.    Skelaxin [Metaxalone] Anaphylaxis   Beef-Derived Products Hives   Pork-Derived Products Hives   Vicodin [Hydrocodone-Acetaminophen] Hives and Other (See Comments)    Feels like her tongue swells. Trouble swallowing   Albuterol Palpitations   Prior to Admission medications   Medication Sig Start Date End Date Taking? Authorizing Provider  blood glucose meter kit and supplies KIT Dispense based on patient and insurance preference. Use up to four times daily as directed. (FOR ICD-9 250.00, 250.01). 03/02/20  Yes Iloabachie, Chioma E, NP  clopidogrel (PLAVIX) 75 MG tablet TAKE ONE TABLET BY MOUTH EVERY DAY 09/14/20 09/14/21 Yes Iloabachie, Chioma E, NP  diphenhydrAMINE (BENADRYL) 50 MG tablet Take 50 mg by mouth 2 (two) times daily.   Yes [provider]  EPINEPHrine (EPIPEN 2-PAK) 0.3 mg/0.3 mL IJ SOAJ injection INJECT 0.3MG INTRAMUSCULARLY ONCE AS DIRECTED. 05/20/20  Yes Iloabachie, Chioma E, NP  EPINEPHrine 0.3 mg/0.3 mL IJ SOAJ injection Inject 0.3 mg into the muscle as needed for anaphylaxis. 01/01/20  Yes Iloabachie, Chioma E, NP  isosorbide mononitrate (IMDUR) 60 MG 24 hr tablet TAKE ONE TABLET BY MOUTH TWICE DAILY 09/14/20 09/14/21 Yes Iloabachie, Chioma E, NP  meclizine (ANTIVERT) 25 MG tablet Take 1 tablet (25 mg total) by mouth 3 (three) times daily as needed for dizziness. 12/16/15  Yes McGowan, Larene Beach A, PA-C  metFORMIN (GLUCOPHAGE) 500 MG tablet TAKE ONE TABLET BY MOUTH 2 TIMES A DAY WITH MEALS 09/14/20 09/14/21 Yes Iloabachie, Chioma E, NP  metoprolol tartrate (LOPRESSOR) 25 MG tablet TAKE ONE TABLET BY MOUTH 2 TIMES A DAY 09/14/20 09/14/21 Yes Iloabachie, Chioma E, NP  nitroGLYCERIN (NITROSTAT) 0.4 MG SL tablet 1 TABLET UNDER TONGUE AS NEEDED FOR CHEST PAIN EVERY 5 MINUTES FOR MAX OF 3 DOSES IN 15 MINUTES; IF NO RELIEF AFTER 1ST DOSE CALL 911  05/13/19  Yes McGowan, Shannon A, PA-C  pantoprazole (PROTONIX) 40 MG tablet TAKE ONE TABLET BY MOUTH EVERY DAY 09/14/20 09/14/21 Yes Iloabachie, Chioma E, NP  potassium chloride SA (KLOR-CON) 20 MEQ tablet Take 1 tablet (20 mEq total) by mouth 2 (two) times daily. 09/20/20  Yes Alisa Graff, FNP  Rightest GL300 Lancets MISC Use as directed 08/12/20  Yes Cammy Copa, [facility]  RIGHTEST QQ761 BLOOD GLUCOSE test strip AS DIRECTED 04/05/20 04/05/21 Yes   simvastatin (ZOCOR) 20 MG tablet Take 1 tablet (20 mg total) by mouth daily.  09/14/20  Yes Iloabachie, Chioma E, NP  spironolactone (ALDACTONE) 25 MG tablet TAKE 1/2 TABLET BY MOUTH EVERY DAY 11/12/20 05/08/21 Yes Hackney, Tina A, FNP  torsemide (DEMADEX) 20 MG tablet Take 3 tablets (60 mg) by mouth in the morning and then take 1 tablet (20 mg) by mouth in the evening. 11/12/20  Yes Alisa Graff, FNP   Review of Systems  Constitutional:  Positive for fatigue ("very little"). Negative for appetite change.  HENT:  Negative for congestion, postnasal drip and sore throat.   Eyes: Negative.   Respiratory:  Positive for shortness of breath (with exertion). Negative for apnea, cough and chest tightness.   Cardiovascular:  Negative for chest pain, palpitations and leg swelling.  Gastrointestinal:  Negative for abdominal distention and abdominal pain.  Endocrine: Negative.   Genitourinary: Negative.   Musculoskeletal:  Positive for arthralgias (left shoulder) and back pain. Negative for neck pain.  Skin: Negative.   Allergic/Immunologic: Positive for food allergies (Had allergic reaction to pork rinds).  Neurological:  Negative for dizziness and light-headedness.  Hematological:  Negative for adenopathy. Does not bruise/bleed easily.  Psychiatric/Behavioral:  Positive for sleep disturbance (sleeping on 1 pillow). Negative for dysphoric mood. The patient is not nervous/anxious.    Vitals:   05/31/21 1359  BP: 124/85  Pulse: 74  Resp: 14  SpO2: 97%  Weight:  279 lb 1 oz (126.6 kg)  Height: 5' 7"  (1.702 m)   Wt Readings from Last 3 Encounters:  05/31/21 279 lb 1 oz (126.6 kg)  04/26/21 288 lb (130.6 kg)  03/15/21 289 lb 8 oz (131.3 kg)   Lab Results  Component Value Date   CREATININE 0.77 12/21/2020   CREATININE 0.77 07/21/2020   CREATININE 0.73 06/21/2020   Physical Exam Vitals and nursing note reviewed.  Constitutional:      General: She is not in acute distress.    Appearance: Normal appearance. She is well-developed. She is not ill-appearing, toxic-appearing or diaphoretic.  HENT:     Head: Normocephalic and atraumatic.  Neck:     Vascular: No JVD.  Cardiovascular:     Rate and Rhythm: Normal rate and regular rhythm.  Pulmonary:     Effort: Pulmonary effort is normal. No respiratory distress.     Breath sounds: No wheezing, rhonchi or rales.  Abdominal:     Palpations: Abdomen is soft.     Tenderness: There is no abdominal tenderness.  Musculoskeletal:        General: No tenderness.     Cervical back: Neck supple.     Right lower leg: No tenderness. No edema.     Left lower leg: No tenderness. No edema.  Skin:    General: Skin is warm and dry.  Neurological:     General: No focal deficit present.     Mental Status: She is alert and oriented to person, place, and time.  Psychiatric:        Mood and Affect: Mood normal.        Behavior: Behavior normal.    Assessment & Plan:  1: Chronic heart failure with preserved ejection fraction with structural changes (mild LVH)- - NYHA class II - euvolemic today  - weighing daily; reminded to call for an overnight weight gain of >2 pounds or a weekly weight gain of >5 pounds - weight 279 today, down from 298 6 months ago - has been following a daily 2L fluid intake along with closely following a 2060m sodium diet - saw cardiology (  [facility]) 08/31/20 - had shortness of breath with lisinopril; may not be a good candidate for entresto - on trulicity per endocrinology, cannot add  SGLT2  - BNP 06/12/20 was 45.8  2: HTN- - BP 124/85 - will see PCP on 06/07/21 - BMP 07/21/20 reviewed and showed sodium 140, potassium 3.4, creatinine 0.77 and GFR >60  3: DM- - A1c was "5.4%" per pt report - glucose at home today was 409 - on trulicity per endocrinology - reports several episodes consistent with hypoglycemia, will report to PCP on 06/07/21   Medication bottles reviewed.   Return in 6 months or sooner for any questions/problems before then.

## 2021-05-31 NOTE — Patient Instructions (Addendum)
You are doing so good with your weight loss!!!  Return in 6 months.

## 2021-06-06 ENCOUNTER — Telehealth: Payer: Self-pay | Admitting: Pharmacist

## 2021-06-06 ENCOUNTER — Other Ambulatory Visit: Payer: Self-pay

## 2021-06-06 NOTE — Telephone Encounter (Signed)
06/06/2021 1:75:10 PM - Trulicity Dose Increase fax to [facility] - Monday, June 06, 2021 3:14 PM --  Faxed Trulicity 1.5mg  DOSE INCREASE to [facility].

## 2021-06-07 ENCOUNTER — Ambulatory Visit: Payer: Self-pay | Admitting: Gerontology

## 2021-06-07 ENCOUNTER — Encounter: Payer: Self-pay | Admitting: Gerontology

## 2021-06-07 ENCOUNTER — Other Ambulatory Visit: Payer: Self-pay

## 2021-06-07 VITALS — BP 127/83 | HR 81 | Temp 97.3°F | Resp 17 | Ht 67.0 in | Wt 283.8 lb

## 2021-06-07 DIAGNOSIS — E782 Mixed hyperlipidemia: Secondary | ICD-10-CM

## 2021-06-07 DIAGNOSIS — E119 Type 2 diabetes mellitus without complications: Secondary | ICD-10-CM

## 2021-06-07 LAB — GLUCOSE, POCT (MANUAL RESULT ENTRY): POC Glucose: 98 mg/dl (ref 70–99)

## 2021-06-07 LAB — POCT GLYCOSYLATED HEMOGLOBIN (HGB A1C): Hemoglobin A1C: 5.9 % — AB (ref 4.0–5.6)

## 2021-06-07 MED ORDER — SIMVASTATIN 20 MG PO TABS
20.0000 mg | ORAL_TABLET | Freq: Every day | ORAL | 1 refills | Status: DC
  Filled 2021-06-07 – 2021-09-01 (×3): qty 90, 90d supply, fill #0
  Filled 2021-11-29: qty 90, 90d supply, fill #1

## 2021-06-07 NOTE — Patient Instructions (Signed)
Heart-Healthy Eating Plan Many factors influence your heart (coronary) health, including eating and exercise habits. Coronary risk increases with abnormal blood fat (lipid) levels. Heart-healthy meal planning includes limiting unhealthy fats, increasing healthy fats, and making other diet and lifestyle changes. What is my plan? Your health care provider may recommend that you: Limit your fat intake to _________% or less of your total calories each day. Limit your saturated fat intake to _________% or less of your total calories each day. Limit the amount of cholesterol in your diet to less than _________ mg per day. What are tips for following this plan? Cooking Cook foods using methods other than frying. Baking, boiling, grilling, and broiling are all good options. Other ways to reduce fat include: Removing the skin from poultry. Removing all visible fats from meats. Steaming vegetables in water or broth. Meal planning  At meals, imagine dividing your plate into fourths: Fill one-half of your plate with vegetables and green salads. Fill one-fourth of your plate with whole grains. Fill one-fourth of your plate with lean protein foods. Eat 4-5 servings of vegetables per day. One serving equals 1 cup raw or cooked vegetable, or 2 cups raw leafy greens. Eat 4-5 servings of fruit per day. One serving equals 1 medium whole fruit,  cup dried fruit,  cup fresh, frozen, or canned fruit, or  cup 100% fruit juice. Eat more foods that contain soluble fiber. Examples include apples, broccoli, carrots, beans, peas, and barley. Aim to get 25-30 g of fiber per day. Increase your consumption of legumes, nuts, and seeds to 4-5 servings per week. One serving of dried beans or legumes equals  cup cooked, 1 serving of nuts is  cup, and 1 serving of seeds equals 1 tablespoon. Fats Choose healthy fats more often. Choose monounsaturated and polyunsaturated fats, such as olive and canola oils, flaxseeds,  walnuts, almonds, and seeds. Eat more omega-3 fats. Choose salmon, mackerel, sardines, tuna, flaxseed oil, and ground flaxseeds. Aim to eat fish at least 2 times each week. Check food labels carefully to identify foods with trans fats or high amounts of saturated fat. Limit saturated fats. These are found in animal products, such as meats, butter, and cream. Plant sources of saturated fats include palm oil, palm kernel oil, and coconut oil. Avoid foods with partially hydrogenated oils in them. These contain trans fats. Examples are stick margarine, some tub margarines, cookies, crackers, and other baked goods. Avoid fried foods. General information Eat more home-cooked food and less restaurant, buffet, and fast food. Limit or avoid alcohol. Limit foods that are high in starch and sugar. Lose weight if you are overweight. Losing just 5-10% of your body weight can help your overall health and prevent diseases such as diabetes and heart disease. Monitor your salt (sodium) intake, especially if you have high blood pressure. Talk with your health care provider about your sodium intake. Try to incorporate more vegetarian meals weekly. What foods can I eat? Fruits All fresh, canned (in natural juice), or frozen fruits. Vegetables Fresh or frozen vegetables (raw, steamed, roasted, or grilled). Green salads. Grains Most grains. Choose whole wheat and whole grains most of the time. Rice and pasta, including brown rice and pastas made with whole wheat. Meats and other proteins Lean, well-trimmed beef, veal, pork, and lamb. Chicken and turkey without skin. All fish and shellfish. Wild duck, rabbit, pheasant, and venison. Egg whites or low-cholesterol egg substitutes. Dried beans, peas, lentils, and tofu. Seeds and most nuts. Dairy Low-fat or nonfat cheeses,   including ricotta and mozzarella. Skim or 1% milk (liquid, powdered, or evaporated). Buttermilk made with low-fat milk. Nonfat or low-fat  yogurt. Fats and oils Non-hydrogenated (trans-free) margarines. Vegetable oils, including soybean, sesame, sunflower, olive, peanut, safflower, corn, canola, and cottonseed. Salad dressings or mayonnaise made with a vegetable oil. Beverages Water (mineral or sparkling). Coffee and tea. Diet carbonated beverages. Sweets and desserts Sherbet, gelatin, and fruit ice. Small amounts of dark chocolate. Limit all sweets and desserts. Seasonings and condiments All seasonings and condiments. The items listed above may not be a complete list of foods and beverages you can eat. Contact a dietitian for more options. What foods are not recommended? Fruits Canned fruit in heavy syrup. Fruit in cream or butter sauce. Fried fruit. Limit coconut. Vegetables Vegetables cooked in cheese, cream, or butter sauce. Fried vegetables. Grains Breads made with saturated or trans fats, oils, or whole milk. Croissants. Sweet rolls. Donuts. High-fat crackers, such as cheese crackers. Meats and other proteins Fatty meats, such as hot dogs, ribs, sausage, bacon, rib-eye roast or steak. High-fat deli meats, such as salami and bologna. Caviar. Domestic duck and goose. Organ meats, such as liver. Dairy Cream, sour cream, cream cheese, and creamed cottage cheese. Whole-milk cheeses. Whole or 2% milk (liquid, evaporated, or condensed). Whole buttermilk. Cream sauce or high-fat cheese sauce. Whole-milk yogurt. Fats and oils Meat fat, or shortening. Cocoa butter, hydrogenated oils, palm oil, coconut oil, palm kernel oil. Solid fats and shortenings, including bacon fat, salt pork, lard, and butter. Nondairy cream substitutes. Salad dressings with cheese or sour cream. Beverages Regular sodas and any drinks with added sugar. Sweets and desserts Frosting. Pudding. Cookies. Cakes. Pies. Milk chocolate or white chocolate. Buttered syrups. Full-fat ice cream or ice cream drinks. The items listed above may not be a complete list of  foods and beverages to avoid. Contact a dietitian for more information. Summary Heart-healthy meal planning includes limiting unhealthy fats, increasing healthy fats, and making other diet and lifestyle changes. Lose weight if you are overweight. Losing just 5-10% of your body weight can help your overall health and prevent diseases such as diabetes and heart disease. Focus on eating a balance of foods, including fruits and vegetables, low-fat or nonfat dairy, lean protein, nuts and legumes, whole grains, and heart-healthy oils and fats. This information is not intended to replace advice given to you by your health care provider. Make sure you discuss any questions you have with your health care provider. Document Revised: 08/05/2020 Document Reviewed: 08/05/2020 Elsevier Patient Education  2022 [employer] Eating Plan DASH stands for Dietary Approaches to Stop Hypertension. The DASH eating plan is a healthy eating plan that has been shown to: Reduce high blood pressure (hypertension). Reduce your risk for type 2 diabetes, heart disease, and stroke. Help with weight loss. What are tips for following this plan? Reading food labels Check food labels for the amount of salt (sodium) per serving. Choose foods with less than 5 percent of the Daily Value of sodium. Generally, foods with less than 300 milligrams (mg) of sodium per serving fit into this eating plan. To find whole grains, look for the word "whole" as the first word in the ingredient list. Shopping Buy products labeled as "low-sodium" or "no salt added." Buy fresh foods. Avoid canned foods and pre-made or frozen meals. Cooking Avoid adding salt when cooking. Use salt-free seasonings or herbs instead of table salt or sea salt. Check with your health care provider or pharmacist before using salt substitutes.  Do not fry foods. Cook foods using healthy methods such as baking, boiling, grilling, roasting, and broiling instead. Cook  with heart-healthy oils, such as olive, canola, avocado, soybean, or sunflower oil. Meal planning  Eat a balanced diet that includes: 4 or more servings of fruits and 4 or more servings of vegetables each day. Try to fill one-half of your plate with fruits and vegetables. 6-8 servings of whole grains each day. Less than 6 oz (170 g) of lean meat, poultry, or fish each day. A 3-oz (85-g) serving of meat is about the same size as a deck of cards. One egg equals 1 oz (28 g). 2-3 servings of low-fat dairy each day. One serving is 1 cup (237 mL). 1 serving of nuts, seeds, or beans 5 times each week. 2-3 servings of heart-healthy fats. Healthy fats called omega-3 fatty acids are found in foods such as walnuts, flaxseeds, fortified milks, and eggs. These fats are also found in cold-water fish, such as sardines, salmon, and mackerel. Limit how much you eat of: Canned or prepackaged foods. Food that is high in trans fat, such as some fried foods. Food that is high in saturated fat, such as fatty meat. Desserts and other sweets, sugary drinks, and other foods with added sugar. Full-fat dairy products. Do not salt foods before eating. Do not eat more than 4 egg yolks a week. Try to eat at least 2 vegetarian meals a week. Eat more home-cooked food and less restaurant, buffet, and fast food. Lifestyle When eating at a restaurant, ask that your food be prepared with less salt or no salt, if possible. If you drink alcohol: Limit how much you use to: 0-1 drink a day for women who are not pregnant. 0-2 drinks a day for men. Be aware of how much alcohol is in your drink. In the [place] one drink equals one 12 oz bottle of beer (355 mL), one 5 oz glass of wine (148 mL), or one 1 oz glass of hard liquor (44 mL). General information Avoid eating more than 2,300 mg of salt a day. If you have hypertension, you may need to reduce your sodium intake to 1,500 mg a day. Work with your health care provider to  maintain a healthy body weight or to lose weight. Ask what an ideal weight is for you. Get at least 30 minutes of exercise that causes your heart to beat faster (aerobic exercise) most days of the week. Activities may include walking, swimming, or biking. Work with your health care provider or dietitian to adjust your eating plan to your individual calorie needs. What foods should I eat? Fruits All fresh, dried, or frozen fruit. Canned fruit in natural juice (without added sugar). Vegetables Fresh or frozen vegetables (raw, steamed, roasted, or grilled). Low-sodium or reduced-sodium tomato and vegetable juice. Low-sodium or reduced-sodium tomato sauce and tomato paste. Low-sodium or reduced-sodium canned vegetables. Grains Whole-grain or whole-wheat bread. Whole-grain or whole-wheat pasta. Brown rice. [place]. Bulgur. Whole-grain and low-sodium cereals. Pita bread. Low-fat, low-sodium crackers. Whole-wheat flour tortillas. Meats and other proteins Skinless chicken or [place]. Ground chicken or [place]. Pork with fat trimmed off. Fish and seafood. Egg whites. Dried beans, peas, or lentils. Unsalted nuts, nut butters, and seeds. Unsalted canned beans. Lean cuts of beef with fat trimmed off. Low-sodium, lean precooked or cured meat, such as sausages or meat loaves. Dairy Low-fat (1%) or fat-free (skim) milk. Reduced-fat, low-fat, or fat-free cheeses. Nonfat, low-sodium ricotta or cottage cheese. Low-fat or nonfat yogurt.  Low-fat, low-sodium cheese. Fats and oils Soft margarine without trans fats. Vegetable oil. Reduced-fat, low-fat, or light mayonnaise and salad dressings (reduced-sodium). Canola, safflower, olive, avocado, soybean, and sunflower oils. Avocado. Seasonings and condiments Herbs. Spices. Seasoning mixes without salt. Other foods Unsalted popcorn and pretzels. Fat-free sweets. The items listed above may not be a complete list of foods and beverages you can eat. Contact a dietitian  for more information. What foods should I avoid? Fruits Canned fruit in a light or heavy syrup. Fried fruit. Fruit in cream or butter sauce. Vegetables Creamed or fried vegetables. Vegetables in a cheese sauce. Regular canned vegetables (not low-sodium or reduced-sodium). Regular canned tomato sauce and paste (not low-sodium or reduced-sodium). Regular tomato and vegetable juice (not low-sodium or reduced-sodium). Angie Fava. Olives. Grains Baked goods made with fat, such as croissants, muffins, or some breads. Dry pasta or rice meal packs. Meats and other proteins Fatty cuts of meat. Ribs. Fried meat. Berniece Salines. Bologna, salami, and other precooked or cured meats, such as sausages or meat loaves. Fat from the back of a pig (fatback). Bratwurst. Salted nuts and seeds. Canned beans with added salt. Canned or smoked fish. Whole eggs or egg yolks. Chicken or [place] with skin. Dairy Whole or 2% milk, cream, and half-and-half. Whole or full-fat cream cheese. Whole-fat or sweetened yogurt. Full-fat cheese. Nondairy creamers. Whipped toppings. Processed cheese and cheese spreads. Fats and oils Butter. Stick margarine. Lard. Shortening. Ghee. Bacon fat. Tropical oils, such as coconut, palm kernel, or palm oil. Seasonings and condiments Onion salt, garlic salt, seasoned salt, table salt, and sea salt. Worcestershire sauce. Tartar sauce. Barbecue sauce. Teriyaki sauce. Soy sauce, including reduced-sodium. Steak sauce. Canned and packaged gravies. Fish sauce. Oyster sauce. Cocktail sauce. Store-bought horseradish. Ketchup. Mustard. Meat flavorings and tenderizers. Bouillon cubes. Hot sauces. Pre-made or packaged marinades. Pre-made or packaged taco seasonings. Relishes. Regular salad dressings. Other foods Salted popcorn and pretzels. The items listed above may not be a complete list of foods and beverages you should avoid. Contact a dietitian for more information. Where to find more information National Heart,  Lung, and Blood Institute: https://wilson-eaton.com/ American Heart Association: www.heart.org Academy of Nutrition and Dietetics: www.eatright.[place]: www.kidney.org Summary The DASH eating plan is a healthy eating plan that has been shown to reduce high blood pressure (hypertension). It may also reduce your risk for type 2 diabetes, heart disease, and stroke. When on the DASH eating plan, aim to eat more fresh fruits and vegetables, whole grains, lean proteins, low-fat dairy, and heart-healthy fats. With the DASH eating plan, you should limit salt (sodium) intake to 2,300 mg a day. If you have hypertension, you may need to reduce your sodium intake to 1,500 mg a day. Work with your health care provider or dietitian to adjust your eating plan to your individual calorie needs. This information is not intended to replace advice given to you by your health care provider. Make sure you discuss any questions you have with your health care provider. Document Revised: 02/28/2019 Document Reviewed: 02/28/2019 Elsevier Patient Education  2022 [employer].

## 2021-06-07 NOTE — Progress Notes (Signed)
Established Patient Office Visit  Subjective:  Patient ID: Kathy [name], female    DOB: Jul 16, 1965  Age: 56 y.o. MRN: [id number]  CC:  Chief Complaint  Patient presents with   Follow-up    HPI Kathy [name] is a 56 y/o female who has history of Heat failure, Asthma, Hyperlipidemia, Heart Murmur,Hypertension, presents for routine follow up, lab review and medication refill. Her HgbA1c done on 06/07/21 decreased from 6.3% to 5.9%. She checks her fasting blood glucose daily and it's usually between 103 to 113 mg/dl. She denies hypo/hyperglycemia ,peripheral neuropathy and performs daily foot checks. She was seen by the [facility] on 04/26/21, she is to continue on 1.5 mg Trulicity weekly. She was equally seen at the Heart Failure clinic by Darylene Price FNP on 05/31/21, and will continue on current medication. Overall, she states that she's doing well and offers no further complaint.    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 02/2016 Echo: EF 45%; b. 03/2016 Echo: EF 55%; c. 12/2019 Echo: EF 55-60%, no rwma, gr2 DD, nl RV size/fxn, mildly dil RA, mild MR.   Asthma    Chronic chest pain    a. ? vasospasm vs endothelial dysfxn. Improved w/ Ranexa but couldn't afford.   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 Presumed in setting of prior h/o MI w/ insignificant coronary dzs.   Diabetes mellitus without complication (HCC)    Heart murmur    History of NICM (nonischemic cardiomyopathy) ([place])    a. 02/2016 Echo: EF 45%; b. 12/2019 Echo: EF 55-60%.   Hyperlipidemia    Hypertension    Myocardial infarction [facility])    Non-obstructive Coronary artery disease    a. Prior caths in 2007, 2011, 2012 - nonobs dzs; b. 03/2016 Cath ([facility]): LM nl, LAD/LCX/RCA insignificant dzs.   Obesity    Rate-dependent LBBB (left bundle branch block)    Remote Tobacco abuse    a. 30+ pack year history - quit 2019.   Seizures ([place])     Past  Surgical History:  Procedure Laterality Date   ANTERIOR CRUCIATE LIGAMENT REPAIR     BACK SURGERY     L4 and L5   CARDIAC CATHETERIZATION  04/09/2016   COLONOSCOPY     COLONOSCOPY WITH PROPOFOL N/A 08/04/2019   Procedure: COLONOSCOPY WITH PROPOFOL;  Surgeon: Lin Landsman, MD;  Location: [facility] ENDOSCOPY;  Service: Gastroenterology;  Laterality: N/A;   EMPYEMA DRAINAGE     at age 56   TONSILLECTOMY     TONSILLECTOMY AND ADENOIDECTOMY  2008   TUBAL LIGATION      Family History  Problem Relation Age of Onset   Kidney disease Sister    Diabetes Sister    COPD Sister    Stroke Sister    Thyroid disease Sister    Diabetes Mother    Stroke Mother    Heart attack Mother    Thyroid disease Mother    Colon cancer Mother    Stroke Father    Heart attack Father    Asthma Daughter    Hypertension Daughter    Thyroid disease Daughter    Asthma Son    Heart disease Maternal Grandmother    Hypertension Maternal Grandmother    Colon cancer Maternal Grandfather    Hypertension Maternal Grandfather    Hyperlipidemia Maternal Grandfather    Stroke Paternal Grandmother    Hypertension Paternal Merchant navy officer  Hyperlipidemia Paternal Grandfather    Heart disease Paternal Grandfather    Thyroid disease Sister    Migraines Sister     Social History   Socioeconomic History   Marital status: Divorced    Spouse name: Not on file   Number of children: 2   Years of education: pre-requisites    Highest education level: GED or equivalent  Occupational History   Occupation: unemployed  Tobacco Use   Smoking status: Former    Packs/day: 1.00    Years: 30.00    Pack years: 30.00    Types: Cigarettes    Quit date: 08/16/2017    Years since quitting: 3.8   Smokeless tobacco: Never  Vaping Use   Vaping Use: Never used  Substance and Sexual Activity   Alcohol use: Yes    Comment: occasional less than monthly   Drug use: Not Currently    Comment: Previously used cocaine in the 1990s    Sexual activity: Yes    Birth control/protection: Condom  Other Topics Concern   Not on file  Social History Narrative   Currently on food stamps. They help somewhat but still difficult to buy food that she needs. Boyfriend helps pay rent with disability, they live together. Roof is leaking really badly. Lives in modular home. Social services bought tarp which helped, but it deteriorated from heat.    Social Determinants of Health   Financial Resource Strain: Not on file  Food Insecurity: No Food Insecurity   Worried About Charity fundraiser in the Last Year: Never true   Ran Out of Food in the Last Year: Never true  Transportation Needs: No Transportation Needs   Lack of Transportation (Medical): No   Lack of Transportation (Non-Medical): No  Physical Activity: Not on file  Stress: Not on file  Social Connections: Not on file  Intimate Partner Violence: Not on file    Outpatient Medications Prior to Visit  Medication Sig Dispense Refill   blood glucose meter kit and supplies KIT Dispense based on patient and insurance preference. Use up to four times daily as directed. (FOR ICD-9 250.00, 250.01). 1 each 0   clopidogrel (PLAVIX) 75 MG tablet TAKE ONE TABLET BY MOUTH EVERY DAY 90 tablet 1   diphenhydrAMINE (BENADRYL) 50 MG tablet Take 50 mg by mouth 2 (two) times daily.     Dulaglutide (TRULICITY) 1.5 IR/6.7EL SOPN Inject 1.5 mg into the skin once a week. 2 mL 12   EPINEPHrine (EPIPEN 2-PAK) 0.3 mg/0.3 mL IJ SOAJ injection INJECT 0.3MG INTRAMUSCULARLY ONCE AS DIRECTED. 4 each 6   glucose blood (RIGHTEST GS550 BLOOD GLUCOSE) test strip Use as directed to test blood sugar. 100 each 11   isosorbide mononitrate (IMDUR) 60 MG 24 hr tablet TAKE ONE TABLET BY MOUTH TWICE DAILY 180 tablet 1   metFORMIN (GLUCOPHAGE) 500 MG tablet TAKE ONE TABLET BY MOUTH 2 TIMES A DAY WITH MEALS 180 tablet 1   metoprolol tartrate (LOPRESSOR) 25 MG tablet TAKE ONE TABLET BY MOUTH 2 TIMES A DAY 180 tablet 1    nitroGLYCERIN (NITROSTAT) 0.4 MG SL tablet 1 TABLET UNDER TONGUE AS NEEDED FOR CHEST PAIN EVERY 5 MINUTES FOR MAX OF 3 DOSES IN 15 MINUTES; IF NO RELIEF AFTER 1ST DOSE CALL 911 25 tablet 0   pantoprazole (PROTONIX) 40 MG tablet TAKE ONE TABLET BY MOUTH ONCE EVERY DAY 90 tablet 1   potassium chloride SA (KLOR-CON M) 20 MEQ tablet Take 1 tablet (20 mEq total) by mouth  2 (two) times daily. 180 tablet 3   Rightest GL300 Lancets MISC Use as directed to test blood sugar. 100 each 11   spironolactone (ALDACTONE) 25 MG tablet TAKE 1/2 TABLET BY MOUTH ONCE EVERY DAY. 45 tablet 3   torsemide (DEMADEX) 20 MG tablet Take 3 tablets (60 mg) by mouth in the morning and then take 1 tablet (20 mg) by mouth in the evening. 360 tablet 3   simvastatin (ZOCOR) 20 MG tablet Take 1 tablet (20 mg total) by mouth once daily. 90 tablet 1   Multiple Vitamins-Minerals (MULTIVITAMIN ADULT) CHEW Chew 2 tablets by mouth daily. (Patient not taking: Reported on 05/31/2021)     dextromethorphan-guaiFENesin (MUCINEX DM) 30-600 MG 12hr tablet Take 1 tablet by mouth daily. (Patient not taking: Reported on 04/26/2021)     No facility-administered medications prior to visit.    Allergies  Allergen Reactions   Aspirin Anaphylaxis    Occurred as a child   Celebrex [Celecoxib] Anaphylaxis and Hives   Fentanyl Anaphylaxis   Gabapentin Shortness Of Breath   Latex Hives   Lisinopril Shortness Of Breath   Morphine Anaphylaxis   Percocet [Oxycodone-Acetaminophen] Hives and Shortness Of Breath   Simvastatin-High Dose Hives and Shortness Of Breath    Can tolerate 20 mg dose.    Skelaxin [Metaxalone] Anaphylaxis   Beef-Derived Products Hives   Pork-Derived Products Hives   Vicodin [Hydrocodone-Acetaminophen] Hives and Other (See Comments)    Feels like her tongue swells. Trouble swallowing   Albuterol Palpitations    ROS Review of Systems  Constitutional: Negative.   Eyes: Negative.   Respiratory: Negative.    Cardiovascular:  Negative.   Skin: Negative.   Neurological: Negative.   Hematological: Negative.   Psychiatric/Behavioral: Negative.       Objective:    Physical Exam HENT:     Head: Normocephalic and atraumatic.     Mouth/Throat:     Mouth: Mucous membranes are moist.  Eyes:     Extraocular Movements: Extraocular movements intact.     Conjunctiva/sclera: Conjunctivae normal.     Pupils: Pupils are equal, round, and reactive to light.  Cardiovascular:     Rate and Rhythm: Normal rate and regular rhythm.     Pulses: Normal pulses.     Heart sounds: Normal heart sounds.  Pulmonary:     Effort: Pulmonary effort is normal.     Breath sounds: Normal breath sounds.  Skin:    General: Skin is warm.  Neurological:     General: No focal deficit present.     Mental Status: She is alert and oriented to person, place, and time. Mental status is at baseline.  Psychiatric:        Mood and Affect: Mood normal.        Behavior: Behavior normal.        Thought Content: Thought content normal.        Judgment: Judgment normal.    BP 127/83 (BP Location: Right Arm, Patient Position: Sitting, Cuff Size: Large)    Pulse 81    Temp (!) 97.3 F (36.3 C) (Oral)    Resp 17    Ht 5' 7"  (1.702 m)    Wt 283 lb 12.8 oz (128.7 kg)    LMP 06/09/2018    SpO2 97%    BMI 44.45 kg/m  Wt Readings from Last 3 Encounters:  06/07/21 283 lb 12.8 oz (128.7 kg)  05/31/21 279 lb 1 oz (126.6 kg)  04/26/21 288 lb (130.6 kg)  Encouraged weight loss  Health Maintenance Due  Topic Date Due   COVID-19 Vaccine (1) Never done   Hepatitis C Screening  Never done   TETANUS/TDAP  Never done   Zoster Vaccines- Shingrix (1 of 2) Never done   INFLUENZA VACCINE  11/08/2020   OPHTHALMOLOGY EXAM  06/11/2021    There are no preventive care reminders to display for this patient.  Lab Results  Component Value Date   TSH 1.770 10/04/2017   Lab Results  Component Value Date   WBC 6.6 06/21/2020   HGB 13.4 06/21/2020   HCT 41.1  06/21/2020   MCV 86.9 06/21/2020   PLT 312 06/21/2020   Lab Results  Component Value Date   NA 142 12/21/2020   K 4.7 12/21/2020   CO2 29 12/21/2020   GLUCOSE 125 (H) 12/21/2020   BUN 10 12/21/2020   CREATININE 0.77 12/21/2020   BILITOT 0.3 12/21/2020   ALKPHOS 129 (H) 12/21/2020   AST 36 12/21/2020   ALT 43 (H) 12/21/2020   PROT 7.4 12/21/2020   ALBUMIN 4.3 12/21/2020   CALCIUM 9.4 12/21/2020   ANIONGAP 11 07/21/2020   EGFR 92 12/21/2020   Lab Results  Component Value Date   CHOL 153 03/09/2021   Lab Results  Component Value Date   HDL 28 (L) 03/09/2021   Lab Results  Component Value Date   LDLCALC 98 03/09/2021   Lab Results  Component Value Date   TRIG 149 03/09/2021   Lab Results  Component Value Date   CHOLHDL 5.5 (H) 03/09/2021   Lab Results  Component Value Date   HGBA1C 5.9 (A) 06/07/2021      Assessment & Plan:   1. Type 2 diabetes mellitus without complication, without long-term current use of insulin (HCC) - Her diabetes is improved, will continue on current medication, low carb/ non concentrated sweet diet and exercise as tolerated. - POCT HgB A1C - POCT Glucose (CBG)  2. Mixed hyperlipidemia - She will continue on current medication, low fat/cholesterol diet and exercise as tolerated. - simvastatin (ZOCOR) 20 MG tablet; Take 1 tablet (20 mg total) by mouth once daily.  Dispense: 90 tablet; Refill: 1      Follow-up: Return in about 6 weeks (around 07/19/2021), or if symptoms worsen or fail to improve.    Klare Criss Jerold Coombe, NP

## 2021-06-09 ENCOUNTER — Other Ambulatory Visit: Payer: Self-pay

## 2021-06-20 ENCOUNTER — Other Ambulatory Visit: Payer: Self-pay

## 2021-06-21 ENCOUNTER — Other Ambulatory Visit: Payer: Self-pay

## 2021-06-30 ENCOUNTER — Other Ambulatory Visit: Payer: Self-pay

## 2021-07-05 ENCOUNTER — Other Ambulatory Visit: Payer: Self-pay

## 2021-07-12 ENCOUNTER — Other Ambulatory Visit: Payer: Self-pay

## 2021-07-19 ENCOUNTER — Other Ambulatory Visit: Payer: Self-pay

## 2021-07-21 ENCOUNTER — Encounter: Payer: Self-pay | Admitting: Gerontology

## 2021-07-21 ENCOUNTER — Other Ambulatory Visit: Payer: Self-pay

## 2021-07-21 ENCOUNTER — Ambulatory Visit: Payer: Self-pay | Admitting: Gerontology

## 2021-07-21 VITALS — BP 124/81 | HR 78 | Temp 97.7°F | Ht 67.0 in | Wt 283.1 lb

## 2021-07-21 DIAGNOSIS — H9191 Unspecified hearing loss, right ear: Secondary | ICD-10-CM

## 2021-07-21 DIAGNOSIS — Z8679 Personal history of other diseases of the circulatory system: Secondary | ICD-10-CM

## 2021-07-21 DIAGNOSIS — K219 Gastro-esophageal reflux disease without esophagitis: Secondary | ICD-10-CM

## 2021-07-21 DIAGNOSIS — E1159 Type 2 diabetes mellitus with other circulatory complications: Secondary | ICD-10-CM

## 2021-07-21 DIAGNOSIS — I10 Essential (primary) hypertension: Secondary | ICD-10-CM

## 2021-07-21 DIAGNOSIS — G8929 Other chronic pain: Secondary | ICD-10-CM | POA: Insufficient documentation

## 2021-07-21 DIAGNOSIS — E119 Type 2 diabetes mellitus without complications: Secondary | ICD-10-CM

## 2021-07-21 MED ORDER — METFORMIN HCL 500 MG PO TABS
500.0000 mg | ORAL_TABLET | Freq: Two times a day (BID) | ORAL | 1 refills | Status: DC
  Filled 2021-07-21: qty 180, fill #0
  Filled 2021-09-18 – 2021-09-19 (×2): qty 180, 90d supply, fill #0
  Filled 2021-12-21: qty 180, 90d supply, fill #1

## 2021-07-21 MED ORDER — CLOPIDOGREL BISULFATE 75 MG PO TABS
75.0000 mg | ORAL_TABLET | Freq: Every day | ORAL | 1 refills | Status: DC
  Filled 2021-07-21: qty 90, fill #0
  Filled 2021-10-24 (×2): qty 90, 90d supply, fill #0
  Filled 2022-02-20: qty 90, 90d supply, fill #1

## 2021-07-21 MED ORDER — PANTOPRAZOLE SODIUM 40 MG PO TBEC
40.0000 mg | DELAYED_RELEASE_TABLET | Freq: Every day | ORAL | 1 refills | Status: DC
  Filled 2021-07-21: qty 90, fill #0
  Filled 2021-08-11 – 2021-10-24 (×2): qty 90, 90d supply, fill #0
  Filled 2021-10-24: qty 90, 90d supply, fill #1
  Filled 2021-11-14 (×2): qty 90, 90d supply, fill #0

## 2021-07-21 NOTE — Progress Notes (Signed)
? ?Established Patient Office Visit ? ?Subjective:  ?Patient ID: Kathy [name], female    DOB: Jul 23, 1965  Age: 56 y.o. MRN: [id number] ? ?CC:  ?Chief Complaint  ?Patient presents with  ? Follow-up  ?  Labs 06/07/21 Knee pain (bilateral - popping when walking). Checks sugars, has log, runs about 100-110. Left shoulder pain -limited ROM.  ? ? ?HPI ?Kathy [name]  is a 56 y/o female who has history of Heat failure, Asthma, Hyperlipidemia, Heart Murmur,Hypertension, presents for c/o left shoulder pain, bilateral knee pain. 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 She also c/o blurry vision and requests Opthalmology referral.  She denies pain, discharge or swelling to eye. She has chronic left shoulder pain, had imagging done 11/30.21 which was negative for fracture or dislocation. She was seen by [facility] Dr Vickki Hearing on 03/23/20, was referred for MRI which has not been done. She experinces discomfort, states pain is constant sharp 8/10 with abducting her left arm and excruciating at night, and can not sleep on her left side. She denies muscle and motor weakness. She also c/o chronic left knee pain secondary to ACL repair in 2004, admits that she has also been experienicng non traumatic , non radiating pain to her right knee. She states that the pain to her knees are constant, and going down the steps aggravates symptoms. She has not taking any medications. Overall, she states that she's doing well and offers no further complaint. ? ? ? ?Past Medical History:  ?Diagnosis Date  ? 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 a. 02/2016 Echo: EF 45%; b. 03/2016 Echo: EF 55%; c. 12/2019 Echo: EF 55-60%, no rwma, gr2 DD, nl RV size/fxn, mildly dil RA, mild MR.  ? Asthma   ? Chronic chest pain   ? a. ? vasospasm vs endothelial dysfxn. Improved w/ Ranexa but  couldn't afford.  ? Coronary vasospasm (presumed)   ? a. Presumed in setting of prior h/o MI w/ insignificant coronary dzs.  ? Diabetes mellitus without complication ([place])   ? Heart murmur   ? History of NICM (nonischemic cardiomyopathy) ([place])   ? a. 02/2016 Echo: EF 45%; b. 12/2019 Echo: EF 55-60%.  ? Hyperlipidemia   ? Hypertension   ? Myocardial infarction [facility])   ? Non-obstructive Coronary artery disease   ? a. Prior caths in 2007, 2011, 2012 - nonobs dzs; b. 03/2016 Cath ([facility]): LM nl, LAD/LCX/RCA insignificant dzs.  ? Obesity   ? Rate-dependent LBBB (left bundle branch block)   ? Remote Tobacco abuse   ? a. 30+ pack year history - quit 2019.  ? Seizures ([place])   ? ? ?Past Surgical History:  ?Procedure Laterality Date  ? ANTERIOR CRUCIATE LIGAMENT REPAIR    ? BACK SURGERY    ? L4 and L5  ? CARDIAC CATHETERIZATION  04/09/2016  ? COLONOSCOPY    ? COLONOSCOPY WITH PROPOFOL N/A 08/04/2019  ? Procedure: COLONOSCOPY WITH PROPOFOL;  Surgeon: Lin Landsman, MD;  Location: [facility] ENDOSCOPY;  Service: Gastroenterology;  Laterality: N/A;  ? EMPYEMA DRAINAGE    ? at age 75  ? TONSILLECTOMY    ? TONSILLECTOMY AND ADENOIDECTOMY  2008  ? TUBAL LIGATION    ? ? ?Family History  ?Problem Relation Age of Onset  ? Kidney disease Sister   ? Diabetes Sister   ?  COPD Sister   ? Stroke Sister   ? Thyroid disease Sister   ? Diabetes Mother   ? Stroke Mother   ? Heart attack Mother   ? Thyroid disease Mother   ? Colon cancer Mother   ? Stroke Father   ? Heart attack Father   ? Asthma Daughter   ? Hypertension Daughter   ? Thyroid disease Daughter   ? Asthma Son   ? Heart disease Maternal Grandmother   ? Hypertension Maternal Grandmother   ? Colon cancer Maternal Grandfather   ? Hypertension Maternal Grandfather   ? Hyperlipidemia Maternal Grandfather   ? Stroke Paternal Grandmother   ? Hypertension Paternal Grandfather   ? Hyperlipidemia Paternal Grandfather   ? Heart disease Paternal Grandfather   ? Thyroid disease Sister   ?  Migraines Sister   ? ? ?Social History  ? ?Socioeconomic History  ? Marital status: Divorced  ?  Spouse name: Not on file  ? Number of children: 2  ? Years of education: pre-requisites   ? Highest education level: GED or equivalent  ?Occupational History  ? Occupation: unemployed  ?Tobacco Use  ? Smoking status: Former  ?  Packs/day: 1.00  ?  Years: 30.00  ?  Pack years: 30.00  ?  Types: Cigarettes  ?  Quit date: 08/16/2017  ?  Years since quitting: 3.9  ? Smokeless tobacco: Never  ?Vaping Use  ? Vaping Use: Never used  ?Substance and Sexual Activity  ? Alcohol use: Not Currently  ?  Comment: occasional less than monthly  ? Drug use: Not Currently  ?  Comment: Previously used cocaine in the 1990s  ? Sexual activity: Yes  ?  Birth control/protection: Condom  ?Other Topics Concern  ? Not on file  ?Social History Narrative  ? Currently on food stamps. They help somewhat but still difficult to buy food that she needs. Boyfriend helps pay rent with disability, they live together. Roof is leaking really badly. Lives in modular home. Social services bought tarp which helped, but it deteriorated from heat.   ? ?Social Determinants of Health  ? ?Financial Resource Strain: Not on file  ?Food Insecurity: No Food Insecurity  ? Worried About Charity fundraiser in the Last Year: Never true  ? Ran Out of Food in the Last Year: Never true  ?Transportation Needs: No Transportation Needs  ? Lack of Transportation (Medical): No  ? Lack of Transportation (Non-Medical): No  ?Physical Activity: Not on file  ?Stress: Not on file  ?Social Connections: Not on file  ?Intimate Partner Violence: Not on file  ? ? ?Outpatient Medications Prior to Visit  ?Medication Sig Dispense Refill  ? diphenhydrAMINE (BENADRYL) 50 MG tablet Take 50 mg by mouth 2 (two) times daily.    ? Dulaglutide (TRULICITY) 1.5 WC/5.8NI SOPN Inject 1.5 mg into the skin once a week. 2 mL 12  ? EPINEPHrine (EPIPEN 2-PAK) 0.3 mg/0.3 mL IJ SOAJ injection INJECT 0.3MG  INTRAMUSCULARLY ONCE AS DIRECTED. 4 each 6  ? metoprolol tartrate (LOPRESSOR) 25 MG tablet TAKE ONE TABLET BY MOUTH 2 TIMES A DAY 180 tablet 1  ? Multiple Vitamins-Minerals (MULTIVITAMIN ADULT) CHEW Chew 2 tablets by mouth daily.    ? nitroGLYCERIN (NITROSTAT) 0.4 MG SL tablet 1 TABLET UNDER TONGUE AS NEEDED FOR CHEST PAIN EVERY 5 MINUTES FOR MAX OF 3 DOSES IN 15 MINUTES; IF NO RELIEF AFTER 1ST DOSE CALL 911 25 tablet 0  ? potassium chloride SA (KLOR-CON M) 20 MEQ  tablet Take 1 tablet (20 mEq total) by mouth 2 (two) times daily. 180 tablet 3  ? simvastatin (ZOCOR) 20 MG tablet Take 1 tablet (20 mg total) by mouth once daily. 90 tablet 1  ? spironolactone (ALDACTONE) 25 MG tablet TAKE 1/2 TABLET BY MOUTH ONCE EVERY DAY. 45 tablet 3  ? torsemide (DEMADEX) 20 MG tablet Take 3 tablets (60 mg) by mouth in the morning and then take 1 tablet (20 mg) by mouth in the evening. 360 tablet 3  ? clopidogrel (PLAVIX) 75 MG tablet TAKE ONE TABLET BY MOUTH EVERY DAY 90 tablet 1  ? metFORMIN (GLUCOPHAGE) 500 MG tablet TAKE ONE TABLET BY MOUTH 2 TIMES A DAY WITH MEALS 180 tablet 1  ? pantoprazole (PROTONIX) 40 MG tablet TAKE ONE TABLET BY MOUTH ONCE EVERY DAY 90 tablet 1  ? blood glucose meter kit and supplies KIT Dispense based on patient and insurance preference. Use up to four times daily as directed. (FOR ICD-9 250.00, 250.01). 1 each 0  ? glucose blood (RIGHTEST GS550 BLOOD GLUCOSE) test strip Use as directed to test blood sugar. 100 each 11  ? isosorbide mononitrate (IMDUR) 60 MG 24 hr tablet TAKE ONE TABLET BY MOUTH TWICE DAILY 180 tablet 1  ? Rightest GL300 Lancets MISC Use as directed to test blood sugar. 100 each 11  ? ?No facility-administered medications prior to visit.  ? ? ?Allergies  ?Allergen Reactions  ? Aspirin Anaphylaxis  ?  Occurred as a child  ? Celebrex [Celecoxib] Anaphylaxis and Hives  ? Fentanyl Anaphylaxis  ? Gabapentin Shortness Of Breath  ? Latex Hives  ? Lisinopril Shortness Of Breath  ? Morphine  Anaphylaxis  ? Percocet [Oxycodone-Acetaminophen] Hives and Shortness Of Breath  ? Simvastatin-High Dose Hives and Shortness Of Breath  ?  Can tolerate 20 mg dose.   ? Skelaxin [Metaxalone] Anaphylaxis  ? Beef-Derived Products Hiv

## 2021-07-22 ENCOUNTER — Other Ambulatory Visit: Payer: Self-pay

## 2021-07-28 ENCOUNTER — Other Ambulatory Visit: Payer: Self-pay

## 2021-07-29 ENCOUNTER — Other Ambulatory Visit: Payer: Self-pay

## 2021-08-01 ENCOUNTER — Other Ambulatory Visit: Payer: Self-pay

## 2021-08-02 ENCOUNTER — Other Ambulatory Visit: Payer: Self-pay

## 2021-08-04 ENCOUNTER — Other Ambulatory Visit: Payer: Self-pay

## 2021-08-11 ENCOUNTER — Other Ambulatory Visit: Payer: Self-pay | Admitting: Gerontology

## 2021-08-11 ENCOUNTER — Other Ambulatory Visit: Payer: Self-pay

## 2021-08-11 DIAGNOSIS — I152 Hypertension secondary to endocrine disorders: Secondary | ICD-10-CM

## 2021-08-11 MED ORDER — ISOSORBIDE MONONITRATE ER 60 MG PO TB24
ORAL_TABLET | Freq: Two times a day (BID) | ORAL | 0 refills | Status: DC
  Filled 2021-08-11: qty 60, 30d supply, fill #0
  Filled 2021-09-18: qty 60, 30d supply, fill #1
  Filled 2021-09-19: qty 60, 30d supply, fill #0
  Filled 2021-10-24: qty 60, 30d supply, fill #1

## 2021-08-12 ENCOUNTER — Other Ambulatory Visit: Payer: Self-pay

## 2021-08-15 ENCOUNTER — Telehealth: Payer: Self-pay | Admitting: Pharmacist

## 2021-08-15 NOTE — Telephone Encounter (Signed)
08/15/2021 3:20:46 PM - Epi-Pen renewal forms to pt & dr ?-- Arletha Pili - Monday, Aug 15, 2021 3:18 PM --Epi-Pen renewal forms to mailed to pt for signature. Also requested POI & taxes. Provider portion in [facility] folder on my desk.  ?

## 2021-08-22 ENCOUNTER — Other Ambulatory Visit: Payer: Self-pay

## 2021-08-22 ENCOUNTER — Telehealth: Payer: Self-pay | Admitting: Pharmacist

## 2021-08-22 NOTE — Telephone Encounter (Signed)
08/22/2021 2:42:19 PM - Epi-Pen renewal pending ?-- Arletha Pili - Monday, Aug 22, 2021 2:41 PM --Received provider signed portion for Epi-pen. Holding for patient signed portion, POI & taxes. ?

## 2021-08-24 ENCOUNTER — Telehealth: Payer: Self-pay | Admitting: Pharmacist

## 2021-08-24 NOTE — Telephone Encounter (Signed)
08/24/2021 12:21:59 PM - Epi-Pen renewal faxed to [employer] ?-- Arletha Pili - Wednesday, Aug 24, 2021 12:20 PM -- ?Epi-Pen renewal faxed to [employer] ?

## 2021-08-30 ENCOUNTER — Other Ambulatory Visit: Payer: Self-pay

## 2021-09-01 ENCOUNTER — Other Ambulatory Visit: Payer: Self-pay

## 2021-09-02 ENCOUNTER — Other Ambulatory Visit: Payer: Self-pay

## 2021-09-07 ENCOUNTER — Other Ambulatory Visit: Payer: Self-pay | Admitting: Family

## 2021-09-07 ENCOUNTER — Other Ambulatory Visit: Payer: Self-pay

## 2021-09-07 DIAGNOSIS — I10 Essential (primary) hypertension: Secondary | ICD-10-CM

## 2021-09-07 MED ORDER — POTASSIUM CHLORIDE CRYS ER 20 MEQ PO TBCR
20.0000 meq | EXTENDED_RELEASE_TABLET | Freq: Two times a day (BID) | ORAL | 3 refills | Status: DC
  Filled 2021-09-07: qty 180, 90d supply, fill #0
  Filled 2021-11-29: qty 180, 90d supply, fill #1
  Filled 2022-02-20: qty 180, 90d supply, fill #2
  Filled 2022-05-22: qty 180, 90d supply, fill #3

## 2021-09-18 ENCOUNTER — Other Ambulatory Visit: Payer: Self-pay | Admitting: Gerontology

## 2021-09-18 DIAGNOSIS — I10 Essential (primary) hypertension: Secondary | ICD-10-CM

## 2021-09-19 ENCOUNTER — Other Ambulatory Visit: Payer: Self-pay

## 2021-09-20 ENCOUNTER — Other Ambulatory Visit: Payer: Self-pay

## 2021-09-20 MED ORDER — METOPROLOL TARTRATE 25 MG PO TABS
ORAL_TABLET | Freq: Two times a day (BID) | ORAL | 1 refills | Status: DC
  Filled 2021-09-20: qty 180, 90d supply, fill #0
  Filled 2021-09-20: qty 180, fill #0
  Filled 2021-12-21: qty 180, 90d supply, fill #1

## 2021-09-27 ENCOUNTER — Ambulatory Visit: Payer: Self-pay | Admitting: Gerontology

## 2021-09-30 ENCOUNTER — Other Ambulatory Visit: Payer: Self-pay

## 2021-09-30 MED ORDER — EPINEPHRINE 0.3 MG/0.3ML IJ SOAJ
INTRAMUSCULAR | 6 refills | Status: AC
Start: 2021-08-16 — End: ?
  Filled 2021-09-30: qty 4, 30d supply, fill #0
  Filled 2021-11-02: qty 4, 30d supply, fill #1

## 2021-10-05 ENCOUNTER — Other Ambulatory Visit: Payer: Self-pay

## 2021-10-05 MED ORDER — AZELASTINE-FLUTICASONE 137-50 MCG/ACT NA SUSP: NASAL | 3 refills | Status: DC

## 2021-10-06 ENCOUNTER — Other Ambulatory Visit: Payer: Self-pay

## 2021-10-12 ENCOUNTER — Other Ambulatory Visit: Payer: Self-pay

## 2021-10-20 ENCOUNTER — Ambulatory Visit: Payer: Self-pay | Admitting: Gerontology

## 2021-10-20 ENCOUNTER — Encounter: Payer: Self-pay | Admitting: Gerontology

## 2021-10-20 ENCOUNTER — Other Ambulatory Visit: Payer: Self-pay

## 2021-10-20 VITALS — BP 124/85 | HR 76 | Temp 97.5°F | Resp 18 | Ht 67.0 in | Wt 291.6 lb

## 2021-10-20 DIAGNOSIS — G8929 Other chronic pain: Secondary | ICD-10-CM

## 2021-10-20 DIAGNOSIS — H9191 Unspecified hearing loss, right ear: Secondary | ICD-10-CM

## 2021-10-20 DIAGNOSIS — E119 Type 2 diabetes mellitus without complications: Secondary | ICD-10-CM

## 2021-10-20 LAB — POCT GLYCOSYLATED HEMOGLOBIN (HGB A1C): Hemoglobin A1C: 6 % — AB (ref 4.0–5.6)

## 2021-10-20 LAB — GLUCOSE, POCT (MANUAL RESULT ENTRY): POC Glucose: 145 mg/dl — AB (ref 70–99)

## 2021-10-20 NOTE — Progress Notes (Signed)
Established Patient Office Visit  Subjective   Patient ID: Kathy [name], female    DOB: 30-Apr-1965  Age: 56 y.o. MRN: [id number]  Chief Complaint  Patient presents with   Follow-up   Diabetes    HPI  Kathy [name]  is a 56 y/o female who has history of Heat failure, Asthma, Hyperlipidemia, Heart Murmur,Hypertension, presents for c/o left shoulder pain, bilateral knee pain. She states that she slipped and her right knee popped, has being using crutches. She states that she's experiencing constant dull non radiating 4/10 pain to right knee. She denies muscle nor motor weakness and paresthesia. She will follow up with [facility] Dr Jefm Bryant on 11/01/21.She states that she's compliant with her medications and continues to mae healthy lifestyle changes. Her HgbA1c was 6% and blood glucose was 145 mg/dl when checked during visit. She denies hypo/hyperglycemic symptoms and peripheral neuropathy, and performs daily foot check.She was see at [facility] for hearing loss on 10/05/21, and will follow up on 12/19/21 for hearing aid consultation. Overall, she states that she's doing well and offers no further complaint.  Review of Systems  Constitutional: Negative.   HENT:  Positive for hearing loss.   Eyes: Negative.   Respiratory: Negative.    Cardiovascular: Negative.   Musculoskeletal: Negative.        Pain to right knee  Skin: Negative.       Objective:     BP 124/85 (BP Location: Left Arm, Patient Position: Sitting, Cuff Size: Large)   Pulse 76   Temp (!) 97.5 F (36.4 C) (Oral)   Resp 18   Ht _0  (1.702 m)   Wt 291 lb 9.6 oz (132.3 kg)   LMP 06/09/2018   SpO2 96%   BMI 45.67 kg/m  BP Readings from Last 3 Encounters:  10/20/21 124/85  07/21/21 124/81  06/07/21 127/83   Wt Readings from Last 3 Encounters:  10/20/21 291 lb 9.6 oz (132.3 kg)  07/21/21 283 lb 1.6 oz (128.4 kg)  06/07/21 283 lb 12.8 oz (128.7 kg)      Physical Exam HENT:     Head: Normocephalic and atraumatic.      Mouth/Throat:     Mouth: Mucous membranes are moist.  Eyes:     Extraocular Movements: Extraocular movements intact.     Conjunctiva/sclera: Conjunctivae normal.     Pupils: Pupils are equal, round, and reactive to light.  Cardiovascular:     Rate and Rhythm: Normal rate and regular rhythm.     Pulses: Normal pulses.     Heart sounds: Normal heart sounds.  Pulmonary:     Effort: Pulmonary effort is normal.     Breath sounds: Normal breath sounds.  Musculoskeletal:        General: Tenderness (palpation to right knee) present.  Neurological:     General: No focal deficit present.     Mental Status: She is alert and oriented to person, place, and time.  Psychiatric:        Mood and Affect: Mood normal.        Behavior: Behavior normal.        Thought Content: Thought content normal.        Judgment: Judgment normal.      Results for orders placed or performed in visit on 10/20/21  POCT Glucose (CBG)  Result Value Ref Range   POC Glucose 145 (A) 70 - 99 mg/dl  POCT HgB A1C  Result Value Ref Range   Hemoglobin  A1C 6.0 (A) 4.0 - 5.6 %   HbA1c POC (<> result, manual entry)     HbA1c, POC (prediabetic range)     HbA1c, POC (controlled diabetic range)      Last CBC Lab Results  Component Value Date   WBC 6.6 06/21/2020   HGB 13.4 06/21/2020   HCT 41.1 06/21/2020   MCV 86.9 06/21/2020   MCH 28.3 06/21/2020   RDW 15.7 (H) 06/21/2020   PLT 312 22/30/0979   Last metabolic panel Lab Results  Component Value Date   GLUCOSE 125 (H) 12/21/2020   NA 142 12/21/2020   K 4.7 12/21/2020   CL 99 12/21/2020   CO2 29 12/21/2020   BUN 10 12/21/2020   CREATININE 0.77 12/21/2020   EGFR 92 12/21/2020   CALCIUM 9.4 12/21/2020   PHOS 4.0 06/21/2020   PROT 7.4 12/21/2020   ALBUMIN 4.3 12/21/2020   LABGLOB 3.1 12/21/2020   AGRATIO 1.4 12/21/2020   BILITOT 0.3 12/21/2020   ALKPHOS 129 (H) 12/21/2020   AST 36 12/21/2020   ALT 43 (H) 12/21/2020   ANIONGAP 11 07/21/2020    Last lipids Lab Results  Component Value Date   CHOL 153 03/09/2021   HDL 28 (L) 03/09/2021   LDLCALC 98 03/09/2021   TRIG 149 03/09/2021   CHOLHDL 5.5 (H) 03/09/2021   Last hemoglobin A1c Lab Results  Component Value Date   HGBA1C 6.0 (A) 10/20/2021      The 10-year ASCVD risk score (Arnett DK, et al., 2019) is: 6.8%    Assessment & Plan:   1. Type 2 diabetes mellitus without complication, without long-term current use of insulin (HCC) -Her hemoglobin A1c was 6% , she will continue current medication, low carbohydrate/non concentrated sweet diet and exercise as tolerated. - POCT Glucose (CBG) - POCT HgB A1C - CBC w/Diff - Comp Met (CMET) - Lipid panel; Future - HgB A1c  2. Hearing loss of right ear, unspecified hearing loss type -She was encouraged to follow-up at [facility] for hearing aid consultation.  3. Chronic left shoulder pain -She will follow-up at [facility] with Dr. Jefm Bryant on 11/01/2021.  4. Chronic pain of both knees -She will follow-up at [facility] with Dr. Jefm Bryant on 11/01/2021.  And was advised to go to the emergency room for worsening symptoms.    Return in about 14 weeks (around 01/26/2022), or if symptoms worsen or fail to improve.    Kathy Hollman Jerold Coombe, NP

## 2021-10-24 ENCOUNTER — Other Ambulatory Visit: Payer: Self-pay | Admitting: Family

## 2021-10-24 ENCOUNTER — Other Ambulatory Visit: Payer: Self-pay

## 2021-10-24 MED ORDER — TORSEMIDE 20 MG PO TABS
ORAL_TABLET | ORAL | 3 refills | Status: DC
  Filled 2021-10-24: qty 360, 90d supply, fill #0
  Filled 2022-01-19: qty 360, 90d supply, fill #1
  Filled 2022-04-24: qty 360, 90d supply, fill #2

## 2021-10-25 ENCOUNTER — Other Ambulatory Visit: Payer: Self-pay | Admitting: Gerontology

## 2021-10-25 ENCOUNTER — Ambulatory Visit: Payer: Self-pay | Admitting: Endocrinology

## 2021-10-25 ENCOUNTER — Other Ambulatory Visit: Payer: Self-pay

## 2021-10-25 ENCOUNTER — Encounter: Payer: Self-pay | Admitting: Endocrinology

## 2021-10-25 VITALS — BP 199/82 | HR 92 | Temp 97.5°F | Wt 295.8 lb

## 2021-10-25 DIAGNOSIS — I25118 Atherosclerotic heart disease of native coronary artery with other forms of angina pectoris: Secondary | ICD-10-CM

## 2021-10-25 DIAGNOSIS — E1159 Type 2 diabetes mellitus with other circulatory complications: Secondary | ICD-10-CM

## 2021-10-25 DIAGNOSIS — I509 Heart failure, unspecified: Secondary | ICD-10-CM

## 2021-10-25 DIAGNOSIS — E119 Type 2 diabetes mellitus without complications: Secondary | ICD-10-CM

## 2021-10-25 DIAGNOSIS — I10 Essential (primary) hypertension: Secondary | ICD-10-CM

## 2021-10-25 DIAGNOSIS — Z6841 Body Mass Index (BMI) 40.0 and over, adult: Secondary | ICD-10-CM

## 2021-10-25 MED ORDER — OZEMPIC (1 MG/DOSE) 4 MG/3ML ~~LOC~~ SOPN
1.0000 mg | PEN_INJECTOR | SUBCUTANEOUS | 6 refills | Status: DC
  Filled 2021-10-25 – 2021-11-02 (×2): qty 3, 28d supply, fill #0
  Filled 2021-11-29: qty 3, 28d supply, fill #1
  Filled 2021-12-27: qty 3, 28d supply, fill #2
  Filled 2022-01-25: qty 12, 112d supply, fill #3

## 2021-10-25 NOTE — Patient Instructions (Signed)
It was so much for coming today! We will check with Dr. Darylene Price and Dr. Sophronia Simas to ask about starting farxiga or jardiance. We will switch you to '1mg'$  Ozempic. In the mean time, finish out 1.'5mg'$  of Trulicity. If the 1.'5mg'$  of Trulicity runs out, please do 0.75 twice a week (once on Wed and once on Thursday). We will see you in 3 months for a follow up.

## 2021-10-25 NOTE — Progress Notes (Unsigned)
Follow up Diabetes/ Endocrine [facility]     Patient ID: Kathy [name], female   DOB: 1965/05/04, 56 y.o.   MRN: [id number] Assessment:  Kathy [name] is a 56 y.o. female who is seen in follow up for No primary diagnosis found. at the request of [place], Chioma E, NP.  Encounter Diagnoses No diagnosis found.  Assessment    Plan:        There are no Patient Instructions on file for this visit.   No orders of the defined types were placed in this encounter.    Subjective:  Kathy [name] is a 56 yr old woman with a PMH of GERD, CHF, HTN and obesity presenting for a T2DM care management follow up. Her HbA1c has decreased to 6% from 6.3% on 03/09/21 and stable at around 6% since 05/2021. She has been checking her blood glucose regularly, once a day and with a fasting glucose around 100 mg/dl. From her last visit, her Trulicity has increased in dose and she has tolerated it well. She states that from January to May, her appetite was curbed and she did not feel the urge to snack. Since May, that feeling has decreased, she cannot get full and she still feels hungry after meals. She has not had any vomiting, nausea or stomach pain. She complains of insomnia for years. She is not taking any blood pressure readings.   Her diabetes treatment regimen consists of Trulicity (1.'5mg'$ , weekly), Metformin ('500mg'$ , BID), spironolactone ('25mg'$ , .5 tablet daily). She has not missed doses. She denies any hyperglycemia symptoms with no polydipsia, polyuria, numbness/tingling. She also denies any hypoglycemia symptoms (no shakes/chills or feelings of passing out). 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  Family History, Social History, current Medications and allergies reviewed and updated in Epic.  Objective:    Last menstrual period 06/09/2018. Physical Exam      Data : I have personally reviewed pertinent labs and imaging studies, if indicated,  with the patient in clinic today.   Lab Orders  No laboratory test(s) ordered today    HC Readings from Last 3 Encounters:  No data found for [facility]    Wt Readings from Last 3 Encounters:  10/20/21 291 lb 9.6 oz (132.3 kg)  07/21/21 283 lb 1.6 oz (128.4 kg)  06/07/21 283 lb 12.8 oz (128.7 kg)

## 2021-10-26 ENCOUNTER — Other Ambulatory Visit: Payer: Self-pay

## 2021-10-26 MED ORDER — ISOSORBIDE MONONITRATE ER 60 MG PO TB24
ORAL_TABLET | Freq: Two times a day (BID) | ORAL | 0 refills | Status: DC
  Filled 2021-10-26: qty 180, fill #0
  Filled 2021-11-20: qty 180, 90d supply, fill #0

## 2021-10-26 NOTE — Progress Notes (Signed)
See medical student note

## 2021-11-01 ENCOUNTER — Encounter: Payer: Self-pay | Admitting: Rheumatology

## 2021-11-01 ENCOUNTER — Ambulatory Visit: Payer: Self-pay | Admitting: Rheumatology

## 2021-11-01 ENCOUNTER — Other Ambulatory Visit: Payer: Self-pay

## 2021-11-01 VITALS — BP 123/85 | HR 107 | Temp 98.3°F | Ht 67.0 in | Wt 292.2 lb

## 2021-11-01 DIAGNOSIS — G8929 Other chronic pain: Secondary | ICD-10-CM

## 2021-11-01 NOTE — Progress Notes (Signed)
[place]  PROGRESS NOTE  [place] Female   DOB:03/10/1966     56 y.o.  GQB:[id number]  Visit Date: 11/01/2021  HPI:  56 year old white female.  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  History of diabetes, coronary disease and heart failure.  Was turned down for disability Several complaints left knee pain.  Prior ACL surgery.  Long standing Recurrent pain after surgery.  Was previously told she may need knee replacement.  Prior injection.  Usually limps.  Does not use cane Recently was limping on her left knee and felt like she twisted her right or walking.  Use crutches now off of them.  Right knee is not recently swollen History of obesity.  Now at 292.  Was 89 Some pain in the neck.  Hurts when she turns it and drives Pain in the shoulder with certain positions.  X-ray and 2021 unremarkable No nonsteroidals because of heart disease No recent hand swelling Has appointment with orthopedics regarding her shoulder at [facility]   Past Medical History:  Diagnosis Date   ([place]) heart failure with improved ejection fraction ([place])    a. 02/2016 Echo: EF 45%; b. 03/2016 Echo: EF 55%; c. 12/2019 Echo: EF 55-60%, no rwma, gr2 DD, nl RV size/fxn, mildly dil RA, mild MR.   Asthma    Chronic chest pain    a. ? vasospasm vs endothelial dysfxn. Improved w/ Ranexa but couldn't afford.   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 Presumed in setting of prior h/o MI w/ insignificant coronary dzs.   Diabetes mellitus without complication (HCC)    Heart murmur    History of NICM (nonischemic cardiomyopathy) ([place])    a. 02/2016 Echo: EF 45%; b. 12/2019 Echo: EF 55-60%.   Hyperlipidemia    Hypertension    Myocardial infarction [facility])    Non-obstructive Coronary artery disease    a. Prior caths in 2007, 2011, 2012 - nonobs dzs; b. 03/2016 Cath ([facility]): LM nl, LAD/LCX/RCA insignificant dzs.   Obesity    Rate-dependent LBBB (left bundle branch block)    Remote Tobacco abuse     a. 30+ pack year history - quit 2019.   Seizures ([place])     Past Surgical History:  Procedure Laterality Date   ANTERIOR CRUCIATE LIGAMENT REPAIR     BACK SURGERY     L4 and L5   CARDIAC CATHETERIZATION  04/09/2016   COLONOSCOPY     COLONOSCOPY WITH PROPOFOL N/A 08/04/2019   Procedure: COLONOSCOPY WITH PROPOFOL;  Surgeon: Lin Landsman, MD;  Location: [facility] ENDOSCOPY;  Service: Gastroenterology;  Laterality: N/A;   EMPYEMA DRAINAGE     at age 14   TONSILLECTOMY     TONSILLECTOMY AND ADENOIDECTOMY  2008   TUBAL LIGATION      Social History   Tobacco Use   Smoking status: Former    Packs/day: 1.00    Years: 30.00    Total pack years: 30.00    Types: Cigarettes    Quit date: 08/16/2017    Years since quitting: 4.2   Smokeless tobacco: Never  Substance Use Topics   Alcohol use: Not Currently    Comment: occasional less than monthly     MEDICATIONS: Current Outpatient Medications  Medication Sig Dispense Refill   blood glucose meter kit and supplies KIT Dispense based on patient and insurance preference. Use up to four times daily as directed. (FOR ICD-9 250.00, 250.01). 1 each 0   clopidogrel (PLAVIX) 75 MG tablet  Take 1 tablet (75 mg total) by mouth once daily. 90 tablet 1   diphenhydrAMINE (BENADRYL) 50 MG tablet Take 50 mg by mouth 2 (two) times daily.     EPINEPHrine 0.3 mg/0.3 mL IJ SOAJ injection Inject 0.3 into the muscle as needed for anaphylaxis 4 each 6   glucose blood (RIGHTEST GS550 BLOOD GLUCOSE) test strip Use as directed to test blood sugar. 100 each 11   isosorbide mononitrate (IMDUR) 60 MG 24 hr tablet TAKE 1 TABLET BY MOUTH TWICE DAILY. 180 tablet 0   metFORMIN (GLUCOPHAGE) 500 MG tablet Take 1 tablet (500 mg total) by mouth 2 (two) times daily with a meal. 180 tablet 1   metoprolol tartrate (LOPRESSOR) 25 MG tablet TAKE ONE TABLET BY MOUTH 2 TIMES A DAY 180 tablet 1   Multiple Vitamins-Minerals (MULTIVITAMIN ADULT) CHEW Chew 2 tablets by mouth daily.  (Patient not taking: Reported on 10/20/2021)     nitroGLYCERIN (NITROSTAT) 0.4 MG SL tablet 1 TABLET UNDER TONGUE AS NEEDED FOR CHEST PAIN EVERY 5 MINUTES FOR MAX OF 3 DOSES IN 15 MINUTES; IF NO RELIEF AFTER 1ST DOSE CALL 911 25 tablet 0   pantoprazole (PROTONIX) 40 MG tablet Take 1 tablet (40 mg total) by mouth once daily. 90 tablet 1   potassium chloride SA (KLOR-CON M) 20 MEQ tablet Take 1 tablet (20 mEq total) by mouth 2 (two) times daily. 180 tablet 3   Rightest GL300 Lancets MISC Use as directed to test blood sugar. 100 each 11   Semaglutide, 1 MG/DOSE, (OZEMPIC, 1 MG/DOSE,) 4 MG/3ML SOPN Inject 1 mg into the skin once a week. Instead of Trulicity 3 mL 6   simvastatin (ZOCOR) 20 MG tablet Take 1 tablet (20 mg total) by mouth once daily. 90 tablet 1   spironolactone (ALDACTONE) 25 MG tablet TAKE 1/2 TABLET BY MOUTH ONCE EVERY DAY. 45 tablet 3   torsemide (DEMADEX) 20 MG tablet Take 3 tablets (60 mg) by mouth in the morning and then take 1 tablet (20 mg) by mouth in the evening. 360 tablet 3   No current facility-administered medications for this visit.     ALLERGIES Allergies  Allergen Reactions   Aspirin Anaphylaxis    Occurred as a child   Celebrex [Celecoxib] Anaphylaxis and Hives   Fentanyl Anaphylaxis   Gabapentin Shortness Of Breath   Latex Hives   Lisinopril Shortness Of Breath   Morphine Anaphylaxis   Percocet [Oxycodone-Acetaminophen] Hives and Shortness Of Breath   Simvastatin-High Dose Hives and Shortness Of Breath    Can tolerate 20 mg dose.    Skelaxin [Metaxalone] Anaphylaxis   Beef-Derived Products Hives   Fluticasone Propionate    Pork-Derived Products Hives   Vicodin [Hydrocodone-Acetaminophen] Hives and Other (See Comments)    Feels like her tongue swells. Trouble swallowing   Albuterol Palpitations     PHYSICAL EXAM: Last menstrual period 06/09/2018. Pleasant female.  No psoriatic patches.  Trace edema.  Good distal pulses Musculoskeletal: Mild  decreased range of motion cervical spine.  Left shoulder has mild impingement at 80 degrees.  Good external rotation right shoulder moves well.  Hands without hypertrophic changes.  Both hips move well.  Crepitance left knee.  Has limited complete flexion.  Right knee with mild medial joint line tenderness but no effusion.  Question Baker's cyst on the left.  Ankles move well.  Good range of motion. Symmetric upper extremity reflexes and power   ASSESSMENT: Osteoarthritis left knee.  Prior cartilage surgery Recent cartilage irritation  right knee.  Improved Left shoulder impingement cannot rule out rotator cuff disease Suspect cervical disc disease without radiculopathy Diabetes Coronary disease and congestive heart failure Obesity     PLAN: Procedure: Left knee prepped sterile manner.  Aspirate dry.  Injected with 2 cc Xylocaine 1 cc Medrol Left shoulder exercises shown She has appointment with orthopedics regarding her left knee Avoid nonsteroidals Tylenol As needed follow-up    G. Fayrene Fearing. MD           11/01/2021,  10:39 AM

## 2021-11-02 ENCOUNTER — Other Ambulatory Visit: Payer: Self-pay

## 2021-11-03 ENCOUNTER — Other Ambulatory Visit: Payer: Self-pay | Admitting: Pharmacist

## 2021-11-03 ENCOUNTER — Other Ambulatory Visit: Payer: Self-pay

## 2021-11-04 ENCOUNTER — Other Ambulatory Visit: Payer: Self-pay

## 2021-11-04 MED ORDER — OZEMPIC (0.25 OR 0.5 MG/DOSE) 2 MG/3ML ~~LOC~~ SOPN
PEN_INJECTOR | SUBCUTANEOUS | 3 refills | Status: DC
  Filled 2021-12-27: qty 6, 28d supply, fill #0

## 2021-11-04 MED ORDER — INSULIN PEN NEEDLE 32G X 6 MM MISC
3 refills | Status: AC
Start: 2021-11-03 — End: ?

## 2021-11-07 ENCOUNTER — Other Ambulatory Visit: Payer: Self-pay | Admitting: Gerontology

## 2021-11-07 ENCOUNTER — Other Ambulatory Visit: Payer: Self-pay

## 2021-11-08 ENCOUNTER — Other Ambulatory Visit: Payer: Self-pay

## 2021-11-08 MED FILL — Lancets: 30 days supply | Qty: 100 | Fill #0 | Status: AC

## 2021-11-10 ENCOUNTER — Other Ambulatory Visit: Payer: Self-pay

## 2021-11-14 ENCOUNTER — Other Ambulatory Visit: Payer: Self-pay

## 2021-11-20 ENCOUNTER — Other Ambulatory Visit: Payer: Self-pay

## 2021-11-21 ENCOUNTER — Other Ambulatory Visit: Payer: Self-pay

## 2021-11-22 ENCOUNTER — Other Ambulatory Visit: Payer: Self-pay

## 2021-11-29 ENCOUNTER — Other Ambulatory Visit: Payer: Self-pay

## 2021-11-29 ENCOUNTER — Other Ambulatory Visit: Payer: Self-pay | Admitting: Family

## 2021-11-29 ENCOUNTER — Ambulatory Visit: Payer: Self-pay | Admitting: Family

## 2021-11-29 MED ORDER — SPIRONOLACTONE 25 MG PO TABS
ORAL_TABLET | Freq: Every day | ORAL | 3 refills | Status: DC
  Filled 2021-11-29: qty 45, 90d supply, fill #0
  Filled 2022-02-20: qty 45, 90d supply, fill #1
  Filled 2022-05-22: qty 45, 90d supply, fill #2

## 2021-11-30 ENCOUNTER — Ambulatory Visit: Payer: Self-pay | Attending: Family | Admitting: Family

## 2021-11-30 ENCOUNTER — Other Ambulatory Visit: Payer: Self-pay

## 2021-11-30 ENCOUNTER — Encounter: Payer: Self-pay | Admitting: Family

## 2021-11-30 VITALS — BP 140/86 | HR 79 | Resp 20 | Ht 67.0 in | Wt 284.4 lb

## 2021-11-30 DIAGNOSIS — I5032 Chronic diastolic (congestive) heart failure: Secondary | ICD-10-CM

## 2021-11-30 DIAGNOSIS — I10 Essential (primary) hypertension: Secondary | ICD-10-CM

## 2021-11-30 DIAGNOSIS — I110 Hypertensive heart disease with heart failure: Secondary | ICD-10-CM | POA: Insufficient documentation

## 2021-11-30 DIAGNOSIS — Z87891 Personal history of nicotine dependence: Secondary | ICD-10-CM | POA: Insufficient documentation

## 2021-11-30 DIAGNOSIS — E785 Hyperlipidemia, unspecified: Secondary | ICD-10-CM | POA: Insufficient documentation

## 2021-11-30 DIAGNOSIS — I2510 Atherosclerotic heart disease of native coronary artery without angina pectoris: Secondary | ICD-10-CM | POA: Insufficient documentation

## 2021-11-30 DIAGNOSIS — E119 Type 2 diabetes mellitus without complications: Secondary | ICD-10-CM

## 2021-11-30 NOTE — Progress Notes (Signed)
Patient ID: Kathy [name], female    DOB: 1965/09/02, 56 y.o.   MRN: [id number]  Kathy [name] is a 56 y/o female with a history of asthma, CAD, hyperlipidemia, HTN, seizures, DM, previous tobacco use and chronic heart failure.   Echo report from 06/14/20 reviewed and showed an EF of 50-55% along with mild LVH. Echo report from 01/06/20 reviewed and showed an EF of 55-60% along with mild LVH and mild MR. Echo report from 02/27/17 reviewed and showed an EF of 55-60%.  No ED visit in the last 6 months.   She presents today for her follow up visit with a chief complaint of minimal fatigue upon moderate exertion. She describes this as chronic in nature having been present for several years. Denies any dizziness, headaches, chest pain, abdominal distention, constipation, diarrhea, nor swelling in legs.   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 02/2016 Echo: EF 45%; b. 03/2016 Echo: EF 55%; c. 12/2019 Echo: EF 55-60%, no rwma, gr2 DD, nl RV size/fxn, mildly dil RA, mild MR.   Asthma    Chronic chest pain    a. ? vasospasm vs endothelial dysfxn. Improved w/ Ranexa but couldn't afford.   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 Presumed in setting of prior h/o MI w/ insignificant coronary dzs.   Diabetes mellitus without complication (HCC)    Heart murmur    History of NICM (nonischemic cardiomyopathy) ([place])    a. 02/2016 Echo: EF 45%; b. 12/2019 Echo: EF 55-60%.   Hyperlipidemia    Hypertension    Myocardial infarction [facility])    Non-obstructive Coronary artery disease    a. Prior caths in 2007, 2011, 2012 - nonobs dzs; b. 03/2016 Cath ([facility]): LM nl, LAD/LCX/RCA insignificant dzs.   Obesity    Rate-dependent LBBB (left bundle branch block)    Remote Tobacco abuse    a. 30+ pack year history - quit 2019.   Seizures ([place])    Past Surgical History:  Procedure Laterality Date   ANTERIOR CRUCIATE LIGAMENT REPAIR     BACK SURGERY     L4  and L5   CARDIAC CATHETERIZATION  04/09/2016   COLONOSCOPY     COLONOSCOPY WITH PROPOFOL N/A 08/04/2019   Procedure: COLONOSCOPY WITH PROPOFOL;  Surgeon: Lin Landsman, MD;  Location: [facility] ENDOSCOPY;  Service: Gastroenterology;  Laterality: N/A;   EMPYEMA DRAINAGE     at age 69   TONSILLECTOMY     TONSILLECTOMY AND ADENOIDECTOMY  2008   TUBAL LIGATION     Family History  Problem Relation Age of Onset   Kidney disease Sister    Diabetes Sister    COPD Sister    Stroke Sister    Thyroid disease Sister    Diabetes Mother    Stroke Mother    Heart attack Mother    Thyroid disease Mother    Colon cancer Mother    Stroke Father    Heart attack Father    Asthma Daughter    Hypertension Daughter    Thyroid disease Daughter    Asthma Son    Heart disease Maternal Grandmother    Hypertension Maternal Grandmother    Colon cancer Maternal Grandfather    Hypertension Maternal Grandfather    Hyperlipidemia Maternal Grandfather    Stroke Paternal Grandmother    Hypertension Paternal Grandfather    Hyperlipidemia Paternal Grandfather    Heart disease Paternal Grandfather  Thyroid disease Sister    Migraines Sister    Social History   Tobacco Use   Smoking status: Former    Packs/day: 1.00    Years: 30.00    Total pack years: 30.00    Types: Cigarettes    Quit date: 08/16/2017    Years since quitting: 4.2   Smokeless tobacco: Never  Substance Use Topics   Alcohol use: Not Currently    Comment: occasional less than monthly   Allergies  Allergen Reactions   Aspirin Anaphylaxis    Occurred as a child   Celebrex [Celecoxib] Anaphylaxis and Hives   Fentanyl Anaphylaxis   Gabapentin Shortness Of Breath   Latex Hives   Lisinopril Shortness Of Breath   Morphine Anaphylaxis   Percocet [Oxycodone-Acetaminophen] Hives and Shortness Of Breath   Simvastatin-High Dose Hives and Shortness Of Breath    Can tolerate 20 mg dose.    Skelaxin [Metaxalone] Anaphylaxis    Beef-Derived Products Hives   Fluticasone Propionate    Pork-Derived Products Hives   Vicodin [Hydrocodone-Acetaminophen] Hives and Other (See Comments)    Feels like her tongue swells. Trouble swallowing   Albuterol Palpitations   Prior to Admission medications   Medication Sig Start Date End Date Taking? Authorizing Provider  blood glucose meter kit and supplies KIT Dispense based on patient and insurance preference. Use up to four times daily as directed. (FOR ICD-9 250.00, 250.01). 03/02/20  Yes Iloabachie, Chioma E, NP  clopidogrel (PLAVIX) 75 MG tablet Take 1 tablet (75 mg total) by mouth once daily. 07/21/21  Yes Iloabachie, Chioma E, NP  diphenhydrAMINE (BENADRYL) 50 MG tablet Take 50 mg by mouth 2 (two) times daily.   Yes [provider]  EPINEPHrine 0.3 mg/0.3 mL IJ SOAJ injection Inject 0.3 into the muscle as needed for anaphylaxis 08/16/21  Yes Iloabachie, Chioma E, NP  glucose blood (RIGHTEST GS550 BLOOD GLUCOSE) test strip Use as directed to test blood sugar. 05/16/21  Yes Cammy Copa, RPH  Insulin Pen Needle 32G X 6 MM MISC Use one pen needle with Ozempic once every week 11/03/21  Yes Iloabachie, Chioma E, NP  isosorbide mononitrate (IMDUR) 60 MG 24 hr tablet TAKE 1 TABLET BY MOUTH TWICE DAILY. 10/26/21  Yes Iloabachie, Chioma E, NP  metFORMIN (GLUCOPHAGE) 500 MG tablet Take 1 tablet (500 mg total) by mouth 2 (two) times daily with a meal. 07/21/21  Yes Iloabachie, Chioma E, NP  metoprolol tartrate (LOPRESSOR) 25 MG tablet TAKE ONE TABLET BY MOUTH 2 TIMES A DAY 09/20/21 09/20/22 Yes Iloabachie, Chioma E, NP  nitroGLYCERIN (NITROSTAT) 0.4 MG SL tablet 1 TABLET UNDER TONGUE AS NEEDED FOR CHEST PAIN EVERY 5 MINUTES FOR MAX OF 3 DOSES IN 15 MINUTES; IF NO RELIEF AFTER 1ST DOSE CALL 911 05/13/19  Yes McGowan, Shannon A, PA-C  pantoprazole (PROTONIX) 40 MG tablet Take 1 tablet (40 mg total) by mouth once daily. 07/21/21  Yes Iloabachie, Chioma E, NP  potassium chloride SA (KLOR-CON M)  20 MEQ tablet Take 1 tablet (20 mEq total) by mouth 2 (two) times daily. 09/07/21  Yes Alisa Graff, FNP  Rightest GL300 Lancets MISC Use as directed to test blood sugar. 11/08/21  Yes Iloabachie, Chioma E, NP  Semaglutide, 1 MG/DOSE, (OZEMPIC, 1 MG/DOSE,) 4 MG/3ML SOPN Inject 1 mg into the skin once a week (Replaces Trulicity) 1/61/09  Yes Iloabachie, Chioma E, NP  simvastatin (ZOCOR) 20 MG tablet Take 1 tablet (20 mg total) by mouth once daily. 06/07/21  Yes  Iloabachie, Chioma E, NP  spironolactone (ALDACTONE) 25 MG tablet TAKE 1/2 TABLET BY MOUTH ONCE EVERY DAY. 11/29/21 05/25/22 Yes Hackney, Aura Fey, FNP  torsemide (DEMADEX) 20 MG tablet Take 3 tablets (60 mg) by mouth in the morning and then take 1 tablet (20 mg) by mouth in the evening. 10/24/21  Yes Alisa Graff, FNP  Multiple Vitamins-Minerals (MULTIVITAMIN ADULT) CHEW Chew 2 tablets by mouth daily. Patient not taking: Reported on 11/01/2021    [provider]  Semaglutide, 1 MG/DOSE, (OZEMPIC, 1 MG/DOSE,) 4 MG/3ML SOPN Inject 1 mg into the skin once a week. Instead of Trulicity Patient not taking: Reported on 11/01/2021 10/25/21   Barnet Pall, MD    Review of Systems  Constitutional:  Positive for fatigue ("very little"). Negative for appetite change.  HENT:  Negative for congestion, postnasal drip and sore throat.   Eyes: Negative.   Respiratory:  Negative for apnea, cough, chest tightness and shortness of breath (with exertion).   Cardiovascular:  Negative for chest pain, palpitations and leg swelling.  Gastrointestinal:  Negative for abdominal distention and abdominal pain.  Endocrine: Negative.   Genitourinary: Negative.   Musculoskeletal:  Positive for arthralgias (both knees) and back pain. Negative for neck pain.  Skin: Negative.   Allergic/Immunologic: Positive for food allergies (Had allergic reaction to pork rinds).  Neurological:  Negative for dizziness and light-headedness.  Hematological:  Negative for adenopathy.  Does not bruise/bleed easily.  Psychiatric/Behavioral:  Positive for sleep disturbance (sleeping on 1 pillow). Negative for dysphoric mood. The patient is not nervous/anxious.     Vitals:   11/30/21 1310  BP: (!) 140/86  Pulse: 79  Resp: 20  SpO2: 98%   Filed Weights   11/30/21 1310  Weight: 284 lb 6 oz (129 kg)    Lab Results  Component Value Date   CREATININE 0.77 12/21/2020   CREATININE 0.77 07/21/2020   CREATININE 0.73 06/21/2020    Physical Exam Vitals and nursing note reviewed.  Constitutional:      General: She is not in acute distress.    Appearance: Normal appearance. She is well-developed. She is not ill-appearing, toxic-appearing or diaphoretic.  HENT:     Head: Normocephalic and atraumatic.  Neck:     Vascular: No JVD.  Cardiovascular:     Rate and Rhythm: Normal rate and regular rhythm.     Heart sounds: Normal heart sounds.  Pulmonary:     Effort: Pulmonary effort is normal. No respiratory distress.     Breath sounds: No wheezing, rhonchi or rales.  Abdominal:     Palpations: Abdomen is soft.     Tenderness: There is no abdominal tenderness.  Musculoskeletal:        General: No tenderness.     Cervical back: Neck supple.     Right lower leg: No tenderness. No edema.     Left lower leg: No tenderness. No edema.  Skin:    General: Skin is warm and dry.  Neurological:     General: No focal deficit present.     Mental Status: She is alert and oriented to person, place, and time.  Psychiatric:        Mood and Affect: Mood normal.        Behavior: Behavior normal.     Assessment & Plan:  1: Chronic heart failure with preserved ejection fraction with structural changes (mild LVH)- - NYHA class II - euvolemic today  - weighing daily; reminded to call for an overnight weight  gain of >2 pounds or a weekly weight gain of >5 pounds - monitors weight at home - weight 284 today, up from 279 since last visit on 05/31/21 - has been following a daily 2L fluid  intake along with closely following a 2075m sodium diet - needs to see Cardiologist says she doesn't have money to go  - Ozempic managed by endocrinology - BNP 06/12/20 was 45.8  2: HTN- - BP 140/86; currently have knee pain - [facility] BMP 12/21/20 reviewed and showed sodium 142, potassium 4.7, creatinine 0.77 and  07/21/20 GFR >60   3: DM- - A1c 10/20/21 6.0% - glucose at home today was 73 - on ozempic per endocrinology   Medication bottles reviewed with patient.   Follow-up 6 months or sooner for any questions/problems before then.

## 2021-11-30 NOTE — Patient Instructions (Signed)
Continue weighing daily and call for an overnight weight gain of 3 pounds or more or a weekly weight gain of more than 5 pounds.  °

## 2021-12-21 ENCOUNTER — Other Ambulatory Visit: Payer: Self-pay

## 2021-12-27 ENCOUNTER — Encounter: Payer: Self-pay | Admitting: Medical

## 2021-12-27 ENCOUNTER — Ambulatory Visit: Payer: Self-pay | Attending: Medical | Admitting: Medical

## 2021-12-27 ENCOUNTER — Other Ambulatory Visit: Payer: Self-pay

## 2021-12-27 VITALS — BP 134/82 | HR 71 | Ht 67.0 in | Wt 281.2 lb

## 2021-12-27 DIAGNOSIS — I5032 Chronic diastolic (congestive) heart failure: Secondary | ICD-10-CM

## 2021-12-27 DIAGNOSIS — E782 Mixed hyperlipidemia: Secondary | ICD-10-CM

## 2021-12-27 DIAGNOSIS — I2510 Atherosclerotic heart disease of native coronary artery without angina pectoris: Secondary | ICD-10-CM

## 2021-12-27 MED ORDER — RANOLAZINE ER 500 MG PO TB12
500.0000 mg | ORAL_TABLET | Freq: Two times a day (BID) | ORAL | 3 refills | Status: DC
  Filled 2021-12-27: qty 60, 30d supply, fill #0
  Filled 2022-01-19: qty 60, 30d supply, fill #1
  Filled 2022-02-20: qty 60, 30d supply, fill #2
  Filled 2022-03-23: qty 60, 30d supply, fill #3
  Filled 2022-04-24: qty 60, 30d supply, fill #4

## 2021-12-27 NOTE — Progress Notes (Signed)
Cardiology Office Note:    Date:  12/27/2021   ID:  Kathy [name], DOB 1965/10/03, MRN [id number]  PCP:  Kathy Reusing, NP  [facility] Cardiologist:  Kathy [place], MD  [facility] Electrophysiologist:  None   Referring MD: Kathy Reusing, NP   Chief Complaint: 6 month follow-up  History of Present Illness:    Kathy [name] is a 56 y.o. female with a hx of chronic chest pain, LBBB, previous tobacco use, obesity, HLD, chronic diastolic heart failure.   She had cardiac cath 4 times in the past 2007, 2011, 2012 and December 2017. All showed mild nonobstructive disease. She has chronic chest pain responsive to NTG, suspected due to coronary spasm or endothelial dysfunction. She is allergic to ASA, and is on long-term Plavix. She was tried on Ranexa with improvement, but she could not continue due to cost.   She was hospitalized in March 2022 with acute on chronic diastolic heart failure. Echo showed LVEF 50-55%. She is on Torsemide 62m in the and 26min the PM.  Last seen 08/31/20 and was doing well from a cardiac perspective.   Today, the patient reports brief sharp chest pain on the left side. It eases off very quickly. Also reports pain in her knees and shoulders. A litle SOB with the pain. It doesn't occur often, worse with exertion. She denies dizziness. Has occasional lightheadedness. No lower leg edema, orthopnea, pnd. No palpitations. She does have some tenderness in her chest as well.   Past Medical History:  Diagnosis Date   (HFimpEF) heart failure with [place] fraction [place]   a. 02/2016 Echo: EF 45%; b. 03/2016 Echo: EF 55%; c. 12/2019 Echo: EF 55-60%, no rwma, gr2 DD, nl RV size/fxn, mildly dil RA, mild MR.   Asthma    Chronic chest pain    a. ? vasospasm vs endothelial dysfxn. Improved w/ Ranexa but couldn't afford.   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 Presumed in setting of prior h/o Kathy [name] w/ insignificant coronary dzs.   Diabetes  mellitus without complication (HCC)    Heart murmur    [place] of [place]   a. 02/2016 Echo: EF 45%; b. 12/2019 Echo: EF 55-60%.   Hyperlipidemia    Hypertension    [facility]   Non-obstructive Coronary artery disease    a. Prior caths in 2007, 2011, 2012 - nonobs dzs; b. 03/2016 Cath ([facility]): LM nl, LAD/LCX/RCA insignificant dzs.   Obesity    Rate-dependent LBBB (left bundle branch block)    Remote Tobacco abuse    a. 30+ pack year history - quit 2019.   Seizures ([place]    Past Surgical History:  Procedure Laterality Date   ANTERIOR CRUCIATE LIGAMENT REPAIR     BACK SURGERY     L4 and L5   CARDIAC CATHETERIZATION  04/09/2016   COLONOSCOPY     COLONOSCOPY WITH PROPOFOL N/A 08/04/2019   Procedure: COLONOSCOPY WITH PROPOFOL;  Surgeon: Kathy LandsmanMD;  [facility];  Service: Gastroenterology;  Laterality: N/A;   EMPYEMA DRAINAGE     at age 56 TONSILLECTOMY     TONSILLECTOMY AND ADENOIDECTOMY  2008   TUBAL LIGATION      Current Medications: Current Meds  Medication Sig   blood glucose meter kit and supplies KIT Dispense based on patient and insurance preference. Use up to four times daily as directed. (FOR ICD-9 250.00, 250.01).  clopidogrel (PLAVIX) 75 MG tablet Take 1 tablet (75 mg total) by mouth once daily.   diphenhydrAMINE (BENADRYL) 50 MG tablet Take 50 mg by mouth 2 (two) times daily.   EPINEPHrine 0.3 mg/0.3 mL IJ SOAJ injection Inject 0.3 into the muscle as needed for anaphylaxis   glucose blood (RIGHTEST GS550 BLOOD GLUCOSE) test strip Use as directed to test blood sugar.   Insulin Pen Needle 32G X 6 MM MISC Use one pen needle with Ozempic once every week   isosorbide mononitrate (IMDUR) 60 MG 24 hr tablet TAKE 1 TABLET BY MOUTH TWICE DAILY.   metFORMIN (GLUCOPHAGE) 500 MG tablet Take 1 tablet (500 mg total) by mouth 2 (two) times daily with a meal.   metoprolol tartrate (LOPRESSOR) 25 MG tablet  TAKE ONE TABLET BY MOUTH 2 TIMES A DAY   Multiple Vitamins-Minerals (MULTIVITAMIN ADULT) CHEW Chew 2 tablets by mouth daily.   nitroGLYCERIN (NITROSTAT) 0.4 MG SL tablet 1 TABLET UNDER TONGUE AS NEEDED FOR CHEST PAIN EVERY 5 MINUTES FOR MAX OF 3 DOSES IN 15 MINUTES; IF NO RELIEF AFTER 1ST DOSE CALL 911   pantoprazole (PROTONIX) 40 MG tablet Take 1 tablet (40 mg total) by mouth once daily.   potassium chloride SA (KLOR-CON M) 20 MEQ tablet Take 1 tablet (20 mEq total) by mouth 2 (two) times daily.   ranolazine (RANEXA) 500 MG 12 hr tablet Take 1 tablet (500 mg total) by mouth 2 (two) times daily.   Rightest GL300 Lancets MISC Use as directed to test blood sugar.   Semaglutide, 1 MG/DOSE, (OZEMPIC, 1 MG/DOSE,) 4 MG/3ML SOPN Inject 1 mg into the skin once a week. Instead of Trulicity   simvastatin (ZOCOR) 20 MG tablet Take 1 tablet (20 mg total) by mouth once daily.   spironolactone (ALDACTONE) 25 MG tablet TAKE 1/2 TABLET BY MOUTH ONCE EVERY DAY.   torsemide (DEMADEX) 20 MG tablet Take 3 tablets (60 mg) by mouth in the morning and then take 1 tablet (20 mg) by mouth in the evening.     Allergies:   Aspirin, Celebrex [celecoxib], Fentanyl, Gabapentin, Hydrocodone-acetaminophen, Latex, Lisinopril, Morphine, Oxycodone-acetaminophen, Percocet [oxycodone-acetaminophen], Simvastatin, Simvastatin-high dose, Skelaxin [metaxalone], Beef-derived products, Fluticasone propionate, Pork-derived products, Vicodin [hydrocodone-acetaminophen], and Albuterol   Social History   Socioeconomic History   Marital status: Divorced    Spouse name: Not on file   Number of children: 2   Years of education: pre-requisites    Highest education level: GED or equivalent  Occupational History   Occupation: unemployed  Tobacco Use   Smoking status: Former    Packs/day: 1.00    Years: 30.00    Total pack years: 30.00    Types: Cigarettes    Quit date: 08/16/2017    Years since quitting: 4.3   Smokeless tobacco: Never   Vaping Use   Vaping Use: Never used  Substance and Sexual Activity   Alcohol use: Not Currently    Comment: occasional less than monthly   Drug use: Not Currently    Comment: Previously used cocaine in the 1990s   Sexual activity: Yes    Birth control/protection: Condom  Other Topics Concern   Not on file  Social History Narrative   Currently on food stamps. They help somewhat but still difficult to buy food that she needs. Boyfriend helps pay rent with disability, they live together. Roof is leaking really badly. Lives in modular home. Social services bought tarp which helped, but it deteriorated from heat.    Social Determinants  of Health   Financial Resource Strain: High Risk (02/12/2018)   Overall Financial Resource Strain (CARDIA)    Difficulty of Paying Living Expenses: Hard  Food Insecurity: No Food Insecurity (10/20/2021)   Hunger Vital Sign    Worried About Running Out of Food in the Last Year: Never true    Ran Out of Food in the Last Year: Never true  Transportation Needs: No Transportation Needs (10/20/2021)   PRAPARE - Hydrologist (Medical): No    Lack of Transportation (Non-Medical): No  Physical Activity: Sufficiently Active (11/06/2017)   Exercise Vital Sign    Days of Exercise per Week: 2 days    Minutes of Exercise per Session: 100 min  Stress: Stress Concern Present (11/06/2017)   [place]    Feeling of Stress : To some extent  Social Connections: Moderately Isolated (11/06/2017)   Social Connection and Isolation Panel [NHANES]    Frequency of Communication with Friends and Family: More than three times a week    Frequency of Social Gatherings with Friends and Family: Once a week    Attends Religious Services: Never    Marine scientist or Organizations: No    Attends Music therapist: Never    Marital Status: Divorced     Family History: The  patient's family history includes Asthma in her daughter and son; COPD in her sister; Colon cancer in her maternal grandfather and mother; Diabetes in her mother and sister; Heart attack in her father and mother; Heart disease in her maternal grandmother and paternal grandfather; Hyperlipidemia in her maternal grandfather and paternal grandfather; Hypertension in her daughter, maternal grandfather, maternal grandmother, and paternal grandfather; Kidney disease in her sister; Migraines in her sister; Stroke in her father, mother, paternal grandmother, and sister; Thyroid disease in her daughter, mother, sister, and sister.  ROS:   Please see the history of present illness.     All other systems reviewed and are negative.  EKGs/Labs/Other Studies Reviewed:    The following studies were reviewed today:  Echo 06/14/20  1. Left ventricular ejection fraction, by estimation, is 50 to 55%. The  left ventricle has low normal function. Left ventricular endocardial  border not optimally defined to evaluate regional wall motion. There is  mild left ventricular hypertrophy. Left  ventricular diastolic parameters are indeterminate.   2. Right ventricular systolic function is normal. The right ventricular  size is normal. Tricuspid regurgitation signal is inadequate for assessing  PA pressure.   3. Left atrial size was mildly dilated.   4. The mitral valve is normal in structure. Trivial mitral valve  regurgitation. No evidence of mitral stenosis.   5. The aortic valve is normal in structure. Aortic valve regurgitation is  not visualized. Mild aortic valve sclerosis is present, with no evidence  of aortic valve stenosis.   6. Challenging image quality.   Echo 12/2019  1. Left ventricular ejection fraction, by estimation, is 55 to 60%. The  left ventricle has normal function. The left ventricle has no regional  wall motion abnormalities. There is mild left ventricular hypertrophy.  Left ventricular  diastolic parameters  are consistent with Grade II diastolic dysfunction (pseudonormalization).  Elevated left atrial pressure.   2. Right ventricular systolic function is normal. The right ventricular  size is normal.   3. Right atrial size was mildly dilated.   4. The mitral valve is normal in structure. Mild mitral valve  regurgitation.   5. The aortic valve is tricuspid. Aortic valve regurgitation is not  visualized. No aortic stenosis is present.   EKG:  EKG is ordered today.  The ekg ordered today demonstrates NSR 71bpm, LBBB, no changes  Recent Labs: No results found for requested labs within last 365 days.  Recent Lipid Panel    Component Value Date/Time   CHOL 153 03/09/2021 1239   TRIG 149 03/09/2021 1239   HDL 28 (L) 03/09/2021 1239   CHOLHDL 5.5 (H) 03/09/2021 1239   CHOLHDL 5.7 02/24/2016 0618   VLDL 30 02/24/2016 0618   LDLCALC 98 03/09/2021 1239   Physical Exam:    VS:  BP 134/82 (BP Location: Left Arm, Patient Position: Sitting, Cuff Size: Large)   Pulse 71   Ht 5' 7"  (1.702 m)   Wt 281 lb 3.2 oz (127.6 kg)   LMP 06/09/2018   SpO2 97%   BMI 44.04 kg/m     Wt Readings from Last 3 Encounters:  12/27/21 281 lb 3.2 oz (127.6 kg)  11/30/21 284 lb 6 oz (129 kg)  11/01/21 292 lb 3.2 oz (132.5 kg)     GEN:  Well nourished, well developed in no acute distress HEENT: Normal NECK: No JVD; No carotid bruits LYMPHATICS: No lymphadenopathy CARDIAC: RRR, no murmurs, rubs, gallops RESPIRATORY:  Clear to auscultation without rales, wheezing or rhonchi  ABDOMEN: Soft, non-tender, non-distended MUSCULOSKELETAL:  No edema; No deformity  SKIN: Warm and dry NEUROLOGIC:  Alert and oriented x 3 PSYCHIATRIC:  Normal affect   ASSESSMENT:    1. Coronary artery disease involving native coronary artery of native heart without angina pectoris   2. Chronic diastolic heart failure ([place])   3. Hyperlipidemia, mixed    PLAN:    In order of problems listed above:  Mild  CAD with chronic atypical chest pain Patient with 4 prior cardiac catheterizations showing mild CAD. She reports fairly atypical chest pain. Suspected chest pain in the past is from spasm vs endothelial dysfunction. She is also tender on palpation.EKG shows NSR with LBBB and no changes. Ranexa improved pain in the past, but she did not have insurance at that time and it was stopped. The patient now has insurance and willing to try again. We will start Ranexa 564m BID. We will see her back in 3 months. If chest pain is persistent at that time, can consider stress testing. She has allergy to ASA, continue Plavix. Continue Lopressor, Imdur, and Simvastatin.  Chronic diastolic heart failure Echo in 06/2020 showed LVEF 50-55%, mild LVH, mildly dilated RA, trivial MR, mild aortic valve sclerosis. She is euvolemic on exam today. Continue Lopressor 25 mg BID, spironolactone 12.562mdaily, and Torsemide 6090mn the am and 35m83m the pm.   HLD LDL 98 in 2022. Continue Simvastatin 12.5mg 65mly.   Disposition: Follow up in 3 month(s) with MD/APP    Signed, Delitha Elms H FurNinfa MeekerC  12/27/2021 2:17 PM    [facility]

## 2021-12-27 NOTE — Patient Instructions (Signed)
Medication Instructions:   Your physician has recommended you make the following change in your medication:   START Ranexa - Take one tablet ('500mg'$ ) by mouth twice a day.   *If you need a refill on your cardiac medications before your next appointment, please call your pharmacy*   Lab Work:  None ordered  If you have labs (blood work) drawn today and your tests are completely normal, you will receive your results only by: [place] (if you have MyChart) OR A paper copy in the mail If you have any lab test that is abnormal or we need to change your treatment, we will call you to review the results.   Testing/Procedures:  None ordered   Follow-Up: At [facility], you and your health needs are our priority.  As part of our continuing mission to provide you with exceptional heart care, we have created designated Provider Care Teams.  These Care Teams include your primary Cardiologist (physician) and Advanced Practice Providers (APPs -  Physician Assistants and Nurse Practitioners) who all work together to provide you with the care you need, when you need it.  We recommend signing up for the patient portal called "MyChart".  Sign up information is provided on this After Visit Summary.  MyChart is used to connect with patients for Virtual Visits (Telemedicine).  Patients are able to view lab/test results, encounter notes, upcoming appointments, etc.  Non-urgent messages can be sent to your provider as well.   To learn more about what you can do with MyChart, go to [email or web address].    Your next appointment:   3 month(s)  The format for your next appointment:   In Person  Provider:   You may see [name], MD or one of the following Advanced Practice Providers on your designated Care Team:   Murray Hodgkins, NP Christell Faith, PA-C Cadence Kathlen Mody, PA-C [facility], Important Information About Sugar

## 2021-12-28 ENCOUNTER — Other Ambulatory Visit: Payer: Self-pay

## 2022-01-18 ENCOUNTER — Other Ambulatory Visit: Payer: Self-pay

## 2022-01-18 DIAGNOSIS — E119 Type 2 diabetes mellitus without complications: Secondary | ICD-10-CM

## 2022-01-19 ENCOUNTER — Other Ambulatory Visit: Payer: Self-pay

## 2022-01-19 LAB — CBC WITH DIFFERENTIAL/PLATELET
Basophils Absolute: 0.1 10*3/uL (ref 0.0–0.2)
Basos: 1 %
EOS (ABSOLUTE): 0.2 10*3/uL (ref 0.0–0.4)
Eos: 3 %
Hematocrit: 40.9 % (ref 34.0–46.6)
Hemoglobin: 13.4 g/dL (ref 11.1–15.9)
Immature Grans (Abs): 0 10*3/uL (ref 0.0–0.1)
Immature Granulocytes: 0 %
Lymphocytes Absolute: 2.3 10*3/uL (ref 0.7–3.1)
Lymphs: 34 %
MCH: 27.1 pg (ref 26.6–33.0)
MCHC: 32.8 g/dL (ref 31.5–35.7)
MCV: 83 fL (ref 79–97)
Monocytes Absolute: 0.5 10*3/uL (ref 0.1–0.9)
Monocytes: 8 %
Neutrophils Absolute: 3.7 10*3/uL (ref 1.4–7.0)
Neutrophils: 54 %
Platelets: 304 10*3/uL (ref 150–450)
RBC: 4.95 x10E6/uL (ref 3.77–5.28)
RDW: 15 % (ref 11.7–15.4)
WBC: 6.8 10*3/uL (ref 3.4–10.8)

## 2022-01-19 LAB — COMPREHENSIVE METABOLIC PANEL
ALT: 22 IU/L (ref 0–32)
AST: 15 IU/L (ref 0–40)
Albumin/Globulin Ratio: 1.6 (ref 1.2–2.2)
Albumin: 4.4 g/dL (ref 3.8–4.9)
Alkaline Phosphatase: 123 IU/L — ABNORMAL HIGH (ref 44–121)
BUN/Creatinine Ratio: 12 (ref 9–23)
BUN: 10 mg/dL (ref 6–24)
Bilirubin Total: 0.3 mg/dL (ref 0.0–1.2)
CO2: 27 mmol/L (ref 20–29)
Calcium: 9.4 mg/dL (ref 8.7–10.2)
Chloride: 97 mmol/L (ref 96–106)
Creatinine, Ser: 0.82 mg/dL (ref 0.57–1.00)
Globulin, Total: 2.7 g/dL (ref 1.5–4.5)
Glucose: 112 mg/dL — ABNORMAL HIGH (ref 70–99)
Potassium: 4.1 mmol/L (ref 3.5–5.2)
Sodium: 140 mmol/L (ref 134–144)
Total Protein: 7.1 g/dL (ref 6.0–8.5)
eGFR: 84 mL/min/{1.73_m2} (ref >59)

## 2022-01-19 LAB — HEMOGLOBIN A1C
Est. average glucose Bld gHb Est-mCnc: 128 mg/dL
Hgb A1c MFr Bld: 6.1 % — ABNORMAL HIGH (ref 4.8–5.6)

## 2022-01-20 ENCOUNTER — Other Ambulatory Visit: Payer: Self-pay

## 2022-01-20 LAB — LIPID PANEL
Chol/HDL Ratio: 5 ratio — ABNORMAL HIGH (ref 0.0–4.4)
Cholesterol, Total: 159 mg/dL (ref 100–199)
HDL: 32 mg/dL — ABNORMAL LOW (ref >39)
LDL Chol Calc (NIH): 93 mg/dL (ref 0–99)
Triglycerides: 198 mg/dL — ABNORMAL HIGH (ref 0–149)
VLDL Cholesterol Cal: 34 mg/dL (ref 5–40)

## 2022-01-24 ENCOUNTER — Ambulatory Visit: Payer: Self-pay

## 2022-01-25 ENCOUNTER — Other Ambulatory Visit: Payer: Self-pay

## 2022-01-26 ENCOUNTER — Ambulatory Visit: Payer: Self-pay | Admitting: Gerontology

## 2022-01-26 ENCOUNTER — Other Ambulatory Visit: Payer: Self-pay

## 2022-01-26 ENCOUNTER — Encounter: Payer: Self-pay | Admitting: Gerontology

## 2022-01-26 VITALS — BP 143/85 | HR 78 | Temp 98.3°F | Resp 16 | Ht 67.0 in | Wt 277.3 lb

## 2022-01-26 DIAGNOSIS — E119 Type 2 diabetes mellitus without complications: Secondary | ICD-10-CM

## 2022-01-26 DIAGNOSIS — I10 Essential (primary) hypertension: Secondary | ICD-10-CM

## 2022-01-26 DIAGNOSIS — E782 Mixed hyperlipidemia: Secondary | ICD-10-CM

## 2022-01-26 DIAGNOSIS — K219 Gastro-esophageal reflux disease without esophagitis: Secondary | ICD-10-CM

## 2022-01-26 MED ORDER — METFORMIN HCL 500 MG PO TABS
500.0000 mg | ORAL_TABLET | Freq: Two times a day (BID) | ORAL | 1 refills | Status: DC
  Filled 2022-01-26 – 2022-03-23 (×2): qty 180, 90d supply, fill #0

## 2022-01-26 MED ORDER — PANTOPRAZOLE SODIUM 40 MG PO TBEC
40.0000 mg | DELAYED_RELEASE_TABLET | Freq: Every day | ORAL | 1 refills | Status: DC
  Filled 2022-01-26 – 2022-02-10 (×2): qty 90, 90d supply, fill #0
  Filled 2022-05-10 – 2022-05-11 (×2): qty 90, 90d supply, fill #1

## 2022-01-26 MED ORDER — BLOOD PRESSURE KIT KIT
1.0000 | PACK | Freq: Every day | 0 refills | Status: AC | PRN
  Filled 2022-01-26: qty 1, fill #0

## 2022-01-26 MED ORDER — SIMVASTATIN 20 MG PO TABS
20.0000 mg | ORAL_TABLET | Freq: Every day | ORAL | 1 refills | Status: DC
  Filled 2022-01-26 – 2022-02-20 (×2): qty 90, 90d supply, fill #0
  Filled 2022-05-22: qty 90, 90d supply, fill #1

## 2022-01-26 MED ORDER — METOPROLOL TARTRATE 25 MG PO TABS
ORAL_TABLET | Freq: Two times a day (BID) | ORAL | 1 refills | Status: DC
  Filled 2022-01-26: qty 180, fill #0
  Filled 2022-03-23: qty 180, 90d supply, fill #0

## 2022-01-26 NOTE — Patient Instructions (Signed)
Carbohydrate Counting for Diabetes Mellitus, Adult Carbohydrate counting is a method of keeping track of how many carbohydrates you eat. Eating carbohydrates increases the amount of sugar (glucose) in the blood. Counting how many carbohydrates you eat improves how well you manage your blood glucose. This, in turn, helps you manage your diabetes. Carbohydrates are measured in grams (g) per serving. It is important to know how many carbohydrates (in grams or by serving size) you can have in each meal. This is different for every person. A dietitian can help you make a meal plan and calculate how many carbohydrates you should have at each meal and snack. What foods contain carbohydrates? Carbohydrates are found in the following foods: Grains, such as breads and cereals. Dried beans and soy products. Starchy vegetables, such as potatoes, peas, and corn. Fruit and fruit juices. Milk and yogurt. Sweets and snack foods, such as cake, cookies, candy, chips, and soft drinks. How do I count carbohydrates in foods? There are two ways to count carbohydrates in food. You can read food labels or learn standard serving sizes of foods. You can use either of these methods or a combination of both. Using the Nutrition Facts label The Nutrition Facts list is included on the labels of almost all packaged foods and beverages in the [place]. It includes: The serving size. Information about nutrients in each serving, including the grams of carbohydrate per serving. To use the Nutrition Facts, decide how many servings you will have. Then, multiply the number of servings by the number of carbohydrates per serving. The resulting number is the total grams of carbohydrates that you will be having. Learning the standard serving sizes of foods When you eat carbohydrate foods that are not packaged or do not include Nutrition Facts on the label, you need to measure the servings in order to count the grams of  carbohydrates. Measure the foods that you will eat with a food scale or measuring cup, if needed. Decide how many standard-size servings you will eat. Multiply the number of servings by 15. For foods that contain carbohydrates, one serving equals 15 g of carbohydrates. For example, if you eat 2 cups or 10 oz (300 g) of strawberries, you will have eaten 2 servings and 30 g of carbohydrates (2 servings x 15 g = 30 g). For foods that have more than one food mixed, such as soups and casseroles, you must count the carbohydrates in each food that is included. The following list contains standard serving sizes of common carbohydrate-rich foods. Each of these servings has about 15 g of carbohydrates: 1 slice of bread. 1 six-inch (15 cm) tortilla. ? cup or 2 oz (53 g) cooked rice or pasta.  cup or 3 oz (85 g) cooked or canned, drained and rinsed beans or lentils.  cup or 3 oz (85 g) starchy vegetable, such as peas, corn, or squash.  cup or 4 oz (120 g) hot cereal.  cup or 3 oz (85 g) boiled or mashed potatoes, or  or 3 oz (85 g) of a large baked potato.  cup or 4 fl oz (118 mL) fruit juice. 1 cup or 8 fl oz (237 mL) milk. 1 small or 4 oz (106 g) apple.  or 2 oz (63 g) of a medium banana. 1 cup or 5 oz (150 g) strawberries. 3 cups or 1 oz (28.3 g) popped popcorn. What is an example of carbohydrate counting? To calculate the grams of carbohydrates in this sample meal, follow the steps   shown below. Sample meal 3 oz (85 g) chicken breast. ? cup or 4 oz (106 g) brown rice.  cup or 3 oz (85 g) corn. 1 cup or 8 fl oz (237 mL) milk. 1 cup or 5 oz (150 g) strawberries with sugar-free whipped topping. Carbohydrate calculation Identify the foods that contain carbohydrates: Rice. Corn. Milk. Strawberries. Calculate how many servings you have of each food: 2 servings rice. 1 serving corn. 1 serving milk. 1 serving strawberries. Multiply each number of servings by 15 g: 2 servings rice x 15  g = 30 g. 1 serving corn x 15 g = 15 g. 1 serving milk x 15 g = 15 g. 1 serving strawberries x 15 g = 15 g. Add together all of the amounts to find the total grams of carbohydrates eaten: 30 g + 15 g + 15 g + 15 g = 75 g of carbohydrates total. What are tips for following this plan? Shopping Develop a meal plan and then make a shopping list. Buy fresh and frozen vegetables, fresh and frozen fruit, dairy, eggs, beans, lentils, and whole grains. Look at food labels. Choose foods that have more fiber and less sugar. Avoid processed foods and foods with added sugars. Meal planning Aim to have the same number of grams of carbohydrates at each meal and for each snack time. Plan to have regular, balanced meals and snacks. Where to find more information American Diabetes Association: diabetes.org Centers for Disease Control and Prevention: cdc.gov Academy of Nutrition and Dietetics: eatright.org Association of Diabetes Care & Education Specialists: diabeteseducator.org Summary Carbohydrate counting is a method of keeping track of how many carbohydrates you eat. Eating carbohydrates increases the amount of sugar (glucose) in your blood. Counting how many carbohydrates you eat improves how well you manage your blood glucose. This helps you manage your diabetes. A dietitian can help you make a meal plan and calculate how many carbohydrates you should have at each meal and snack. This information is not intended to replace advice given to you by your health care provider. Make sure you discuss any questions you have with your health care provider. Document Revised: 10/29/2019 Document Reviewed: 10/29/2019 Elsevier Patient Education  2023 Elsevier Inc. DASH Eating Plan DASH stands for Dietary Approaches to Stop Hypertension. The DASH eating plan is a healthy eating plan that has been shown to: Reduce high blood pressure (hypertension). Reduce your risk for type 2 diabetes, heart disease, and  stroke. Help with weight loss. What are tips for following this plan? Reading food labels Check food labels for the amount of salt (sodium) per serving. Choose foods with less than 5 percent of the Daily Value of sodium. Generally, foods with less than 300 milligrams (mg) of sodium per serving fit into this eating plan. To find whole grains, look for the word "whole" as the first word in the ingredient list. Shopping Buy products labeled as "low-sodium" or "no salt added." Buy fresh foods. Avoid canned foods and pre-made or frozen meals. Cooking Avoid adding salt when cooking. Use salt-free seasonings or herbs instead of table salt or sea salt. Check with your health care provider or pharmacist before using salt substitutes. Do not fry foods. Cook foods using healthy methods such as baking, boiling, grilling, roasting, and broiling instead. Cook with heart-healthy oils, such as olive, canola, avocado, soybean, or sunflower oil. Meal planning  Eat a balanced diet that includes: 4 or more servings of fruits and 4 or more servings of vegetables each day.   Try to fill one-half of your plate with fruits and vegetables. 6-8 servings of whole grains each day. Less than 6 oz (170 g) of lean meat, poultry, or fish each day. A 3-oz (85-g) serving of meat is about the same size as a deck of cards. One egg equals 1 oz (28 g). 2-3 servings of low-fat dairy each day. One serving is 1 cup (237 mL). 1 serving of nuts, seeds, or beans 5 times each week. 2-3 servings of heart-healthy fats. Healthy fats called omega-3 fatty acids are found in foods such as walnuts, flaxseeds, fortified milks, and eggs. These fats are also found in cold-water fish, such as sardines, salmon, and mackerel. Limit how much you eat of: Canned or prepackaged foods. Food that is high in trans fat, such as some fried foods. Food that is high in saturated fat, such as fatty meat. Desserts and other sweets, sugary drinks, and other foods  with added sugar. Full-fat dairy products. Do not salt foods before eating. Do not eat more than 4 egg yolks a week. Try to eat at least 2 vegetarian meals a week. Eat more home-cooked food and less restaurant, buffet, and fast food. Lifestyle When eating at a restaurant, ask that your food be prepared with less salt or no salt, if possible. If you drink alcohol: Limit how much you use to: 0-1 drink a day for women who are not pregnant. 0-2 drinks a day for men. Be aware of how much alcohol is in your drink. In the [place] one drink equals one 12 oz bottle of beer (355 mL), one 5 oz glass of wine (148 mL), or one 1 oz glass of hard liquor (44 mL). General information Avoid eating more than 2,300 mg of salt a day. If you have hypertension, you may need to reduce your sodium intake to 1,500 mg a day. Work with your health care provider to maintain a healthy body weight or to lose weight. Ask what an ideal weight is for you. Get at least 30 minutes of exercise that causes your heart to beat faster (aerobic exercise) most days of the week. Activities may include walking, swimming, or biking. Work with your health care provider or dietitian to adjust your eating plan to your individual calorie needs. What foods should I eat? Fruits All fresh, dried, or frozen fruit. Canned fruit in natural juice (without added sugar). Vegetables Fresh or frozen vegetables (raw, steamed, roasted, or grilled). Low-sodium or reduced-sodium tomato and vegetable juice. Low-sodium or reduced-sodium tomato sauce and tomato paste. Low-sodium or reduced-sodium canned vegetables. Grains Whole-grain or whole-wheat bread. Whole-grain or whole-wheat pasta. Brown rice. Oatmeal. Quinoa. Bulgur. Whole-grain and low-sodium cereals. Pita bread. Low-fat, low-sodium crackers. Whole-wheat flour tortillas. Meats and other proteins Skinless chicken or turkey. Ground chicken or turkey. Pork with fat trimmed off. Fish and seafood. Egg  whites. Dried beans, peas, or lentils. Unsalted nuts, nut butters, and seeds. Unsalted canned beans. Lean cuts of beef with fat trimmed off. Low-sodium, lean precooked or cured meat, such as sausages or meat loaves. Dairy Low-fat (1%) or fat-free (skim) milk. Reduced-fat, low-fat, or fat-free cheeses. Nonfat, low-sodium ricotta or cottage cheese. Low-fat or nonfat yogurt. Low-fat, low-sodium cheese. Fats and oils Soft margarine without trans fats. Vegetable oil. Reduced-fat, low-fat, or light mayonnaise and salad dressings (reduced-sodium). Canola, safflower, olive, avocado, soybean, and sunflower oils. Avocado. Seasonings and condiments Herbs. Spices. Seasoning mixes without salt. Other foods Unsalted popcorn and pretzels. Fat-free sweets. The items listed above may not be   a complete list of foods and beverages you can eat. Contact a dietitian for more information. What foods should I avoid? Fruits Canned fruit in a light or heavy syrup. Fried fruit. Fruit in cream or butter sauce. Vegetables Creamed or fried vegetables. Vegetables in a cheese sauce. Regular canned vegetables (not low-sodium or reduced-sodium). Regular canned tomato sauce and paste (not low-sodium or reduced-sodium). Regular tomato and vegetable juice (not low-sodium or reduced-sodium). Pickles. Olives. Grains Baked goods made with fat, such as croissants, muffins, or some breads. Dry pasta or rice meal packs. Meats and other proteins Fatty cuts of meat. Ribs. Fried meat. Bacon. Bologna, salami, and other precooked or cured meats, such as sausages or meat loaves. Fat from the back of a pig (fatback). Bratwurst. Salted nuts and seeds. Canned beans with added salt. Canned or smoked fish. Whole eggs or egg yolks. Chicken or turkey with skin. Dairy Whole or 2% milk, cream, and half-and-half. Whole or full-fat cream cheese. Whole-fat or sweetened yogurt. Full-fat cheese. Nondairy creamers. Whipped toppings. Processed cheese and  cheese spreads. Fats and oils Butter. Stick margarine. Lard. Shortening. Ghee. Bacon fat. Tropical oils, such as coconut, palm kernel, or palm oil. Seasonings and condiments Onion salt, garlic salt, seasoned salt, table salt, and sea salt. Worcestershire sauce. Tartar sauce. Barbecue sauce. Teriyaki sauce. Soy sauce, including reduced-sodium. Steak sauce. Canned and packaged gravies. Fish sauce. Oyster sauce. Cocktail sauce. Store-bought horseradish. Ketchup. Mustard. Meat flavorings and tenderizers. Bouillon cubes. Hot sauces. Pre-made or packaged marinades. Pre-made or packaged taco seasonings. Relishes. Regular salad dressings. Other foods Salted popcorn and pretzels. The items listed above may not be a complete list of foods and beverages you should avoid. Contact a dietitian for more information. Where to find more information National Heart, Lung, and Blood Institute: www.nhlbi.nih.gov American Heart Association: www.heart.org Academy of Nutrition and Dietetics: www.eatright.org National Kidney Foundation: www.kidney.org Summary The DASH eating plan is a healthy eating plan that has been shown to reduce high blood pressure (hypertension). It may also reduce your risk for type 2 diabetes, heart disease, and stroke. When on the DASH eating plan, aim to eat more fresh fruits and vegetables, whole grains, lean proteins, low-fat dairy, and heart-healthy fats. With the DASH eating plan, you should limit salt (sodium) intake to 2,300 mg a day. If you have hypertension, you may need to reduce your sodium intake to 1,500 mg a day. Work with your health care provider or dietitian to adjust your eating plan to your individual calorie needs. This information is not intended to replace advice given to you by your health care provider. Make sure you discuss any questions you have with your health care provider. Document Revised: 02/28/2019 Document Reviewed: 02/28/2019 Elsevier Patient Education  2023  Elsevier Inc.  

## 2022-01-26 NOTE — Progress Notes (Signed)
Established Patient Office Visit  Subjective   Patient ID: Kathy [name], female    DOB: 05-20-1965  Age: 56 y.o. MRN: [id number]  Chief Complaint  Patient presents with   Follow-up    Labs drawn 01/18/22   Diabetes    HPI  Kathy [name]  is a 56 y/o female who has history of Heat failure, Asthma, Hyperlipidemia, Heart Murmur,Hypertension, presents for a follow-up and lab review. Her blood work was done on 01/18/22. Her HgbA1c was 6.1%, triglycerides 198 mg/dL, HDL 32 mg/dL. She checks her blood glucose daily and it normally is 90-100s mg/dL. She denies polyuria, polydipsia, or tingling/numbness in her extremities. She endorses ongoing pain in her bilateral knees and shoulders. Currently her shoulder pain is a 5/10 at rest and 10/10 with abduction/adduction/internal/external rotation. She describes the pain as "achey," and it radiates to her neck. Her knee pain is constantly a 9/10. She describes it as "sharp," "throbbing," and "stiff," and it radiates to her hips. She does not currently take any medications for symptom relief due to multiple allergies. She states her chest pain has been better since restarting Ranexa, and she has been able to go on walks more. Overall, she is doing well and offers no further complaints.   Review of Systems  Constitutional: Negative.   HENT: Negative.    Eyes: Negative.   Respiratory: Negative.    Cardiovascular: Negative.   Gastrointestinal: Negative.   Genitourinary: Negative.   Musculoskeletal:  Positive for joint pain (bilteral knee and bilateral shoulders).  Skin: Negative.   Neurological: Negative.   Endo/Heme/Allergies: Negative.   Psychiatric/Behavioral: Negative.        Objective:     BP (!) 143/85 (BP Location: Left Arm, Patient Position: Sitting, Cuff Size: Large)   Pulse 78   Temp 98.3 F (36.8 C) (Oral)   Resp 16   Ht 5' 7" (1.702 m)   Wt 277 lb 4.8 oz (125.8 kg)   LMP 06/09/2018   SpO2 94%   BMI 43.43 kg/m  BP  Readings from Last 3 Encounters:  01/26/22 (!) 143/85  01/18/22 117/74  12/27/21 134/82   Wt Readings from Last 3 Encounters:  01/26/22 277 lb 4.8 oz (125.8 kg)  01/18/22 279 lb 4.8 oz (126.7 kg)  12/27/21 281 lb 3.2 oz (127.6 kg)      Physical Exam Constitutional:      Appearance: Normal appearance. She is obese.  HENT:     Head: Normocephalic and atraumatic.  Cardiovascular:     Rate and Rhythm: Normal rate and regular rhythm.     Pulses: Normal pulses.     Heart sounds: Normal heart sounds.  Pulmonary:     Effort: Pulmonary effort is normal.     Breath sounds: Normal breath sounds.  Skin:    General: Skin is warm and dry.  Neurological:     General: No focal deficit present.     Mental Status: She is alert and oriented to person, place, and time. Mental status is at baseline.  Psychiatric:        Mood and Affect: Mood normal.        Behavior: Behavior normal.        Thought Content: Thought content normal.        Judgment: Judgment normal.      No results found for any visits on 01/26/22.  Last CBC Lab Results  Component Value Date   WBC 6.8 01/18/2022   HGB 13.4 01/18/2022  HCT 40.9 01/18/2022   MCV 83 01/18/2022   MCH 27.1 01/18/2022   RDW 15.0 01/18/2022   PLT 304 46/65/9935   Last metabolic panel Lab Results  Component Value Date   GLUCOSE 112 (H) 01/18/2022   NA 140 01/18/2022   K 4.1 01/18/2022   CL 97 01/18/2022   CO2 27 01/18/2022   BUN 10 01/18/2022   CREATININE 0.82 01/18/2022   EGFR 84 01/18/2022   CALCIUM 9.4 01/18/2022   PHOS 4.0 06/21/2020   PROT 7.1 01/18/2022   ALBUMIN 4.4 01/18/2022   LABGLOB 2.7 01/18/2022   AGRATIO 1.6 01/18/2022   BILITOT 0.3 01/18/2022   ALKPHOS 123 (H) 01/18/2022   AST 15 01/18/2022   ALT 22 01/18/2022   ANIONGAP 11 07/21/2020   Last lipids Lab Results  Component Value Date   CHOL 159 01/18/2022   HDL 32 (L) 01/18/2022   LDLCALC 93 01/18/2022   TRIG 198 (H) 01/18/2022   CHOLHDL 5.0 (H)  01/18/2022   Last hemoglobin A1c Lab Results  Component Value Date   HGBA1C 6.1 (H) 01/18/2022   Last thyroid functions Lab Results  Component Value Date   TSH 1.770 10/04/2017      The 10-year ASCVD risk score (Arnett DK, et al., 2019) is: 8.3%    Assessment & Plan:   1. Essential hypertension - She should continue on her current medication. Her goal is less than 130/80 mmHg. She was given a blood pressure monitoring kit today in clinic. Record daily blood pressure and bring log to next visit. DASH diet and exercise as tolerated.  - Blood Pressure Monitoring (BLOOD PRESSURE KIT) KIT; 1 kit by Does not apply route daily as needed.  Dispense: 1 kit; Refill: 0 - metoprolol tartrate (LOPRESSOR) 25 MG tablet; TAKE ONE TABLET BY MOUTH 2 TIMES A DAY  Dispense: 180 tablet; Refill: 1  2. Type 2 diabetes mellitus without complication, without long-term current use of insulin (HCC) - Her diabetes is well-controlled. Continue metformin as prescribed. Foot exam performed with no abnormalities. Low-carb diet and exercise as tolerated. Continue checking daily blood glucose and bring in log to next appointment.  - metFORMIN (GLUCOPHAGE) 500 MG tablet; Take 1 tablet (500 mg total) by mouth 2 (two) times daily with a meal.  Dispense: 180 tablet; Refill: 1  3. Gastroesophageal reflux disease, unspecified whether esophagitis present - Her GERD is well-controlled. She should avoid trigger foods such as caffeine, chocolate, spicy/acidic foods. Eat small, frequent meals and do not lay down within 30 minutes after eating.  - pantoprazole (PROTONIX) 40 MG tablet; Take 1 tablet (40 mg total) by mouth once daily.  Dispense: 90 tablet; Refill: 1  4. Mixed hyperlipidemia - Continue taking simvastatin as prescribed. Low-cholesterol diet and exercise as tolerated.  - simvastatin (ZOCOR) 20 MG tablet; Take 1 tablet (20 mg total) by mouth once daily.  Dispense: 90 tablet; Refill: 1   Follow-up in 3 months,  04/26/2022   Rayvon Char, FNP Student

## 2022-01-30 IMAGING — US US EXTREM LOW VENOUS*R*
1 series · 13 of 24 positions shown · non-contrast
Comparison: None.

CLINICAL DATA: 54-year-old with right groin pain.



[Series 1: us venous img lower uni right (dvt) · portal-venous · 13 of 37 slices shown]
[im 1/37]
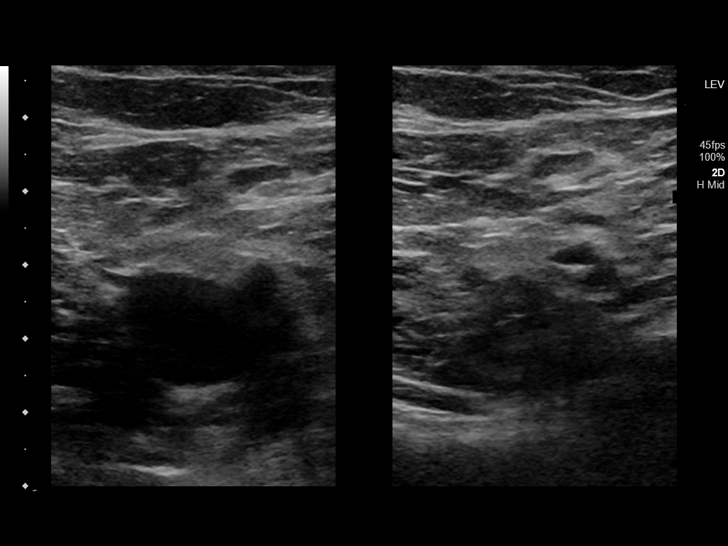
[im 4/37]
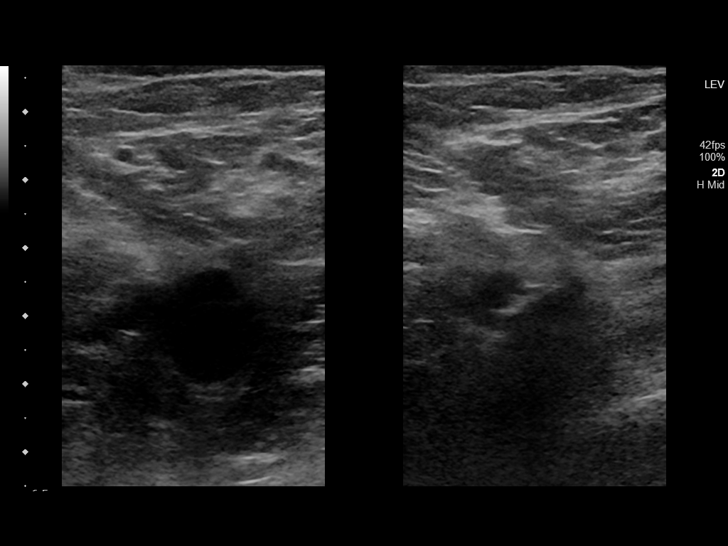
[im 7/37]
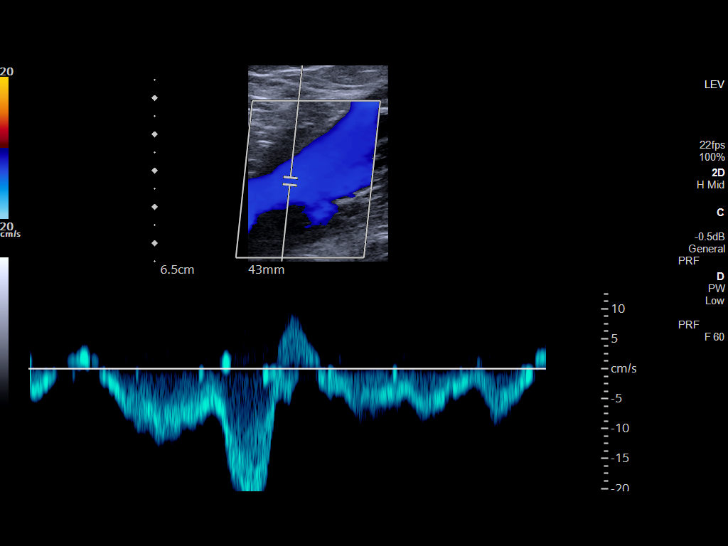
[im 10/37]
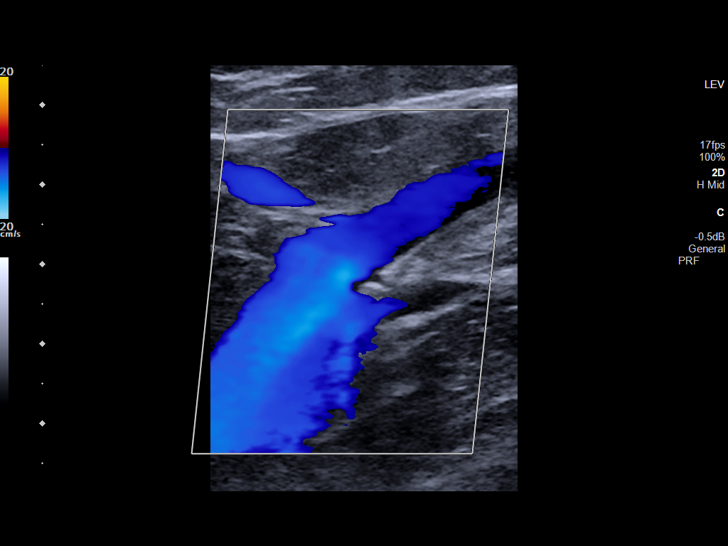
[im 13/37]
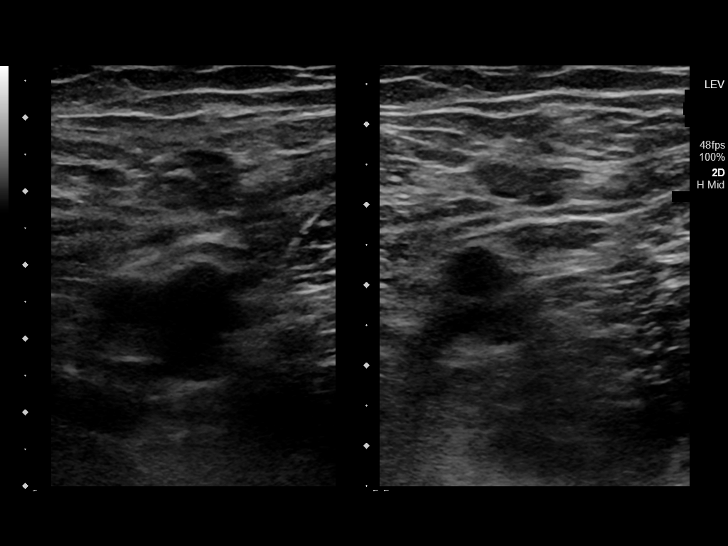
[im 16/37]
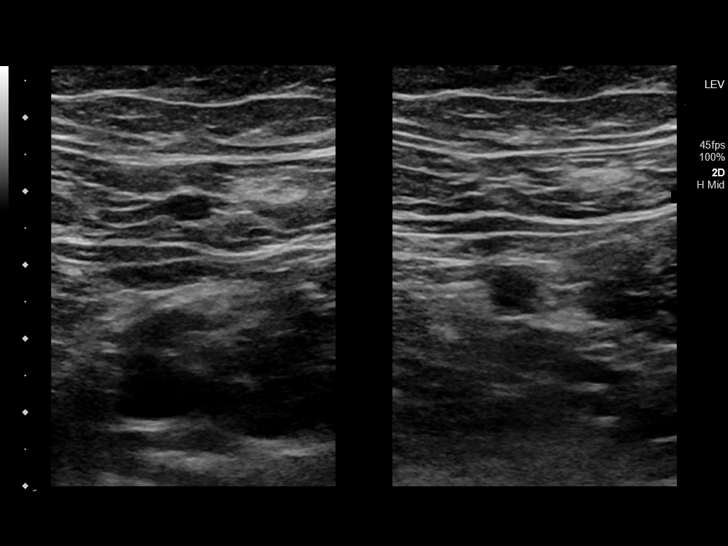
[im 19/37]
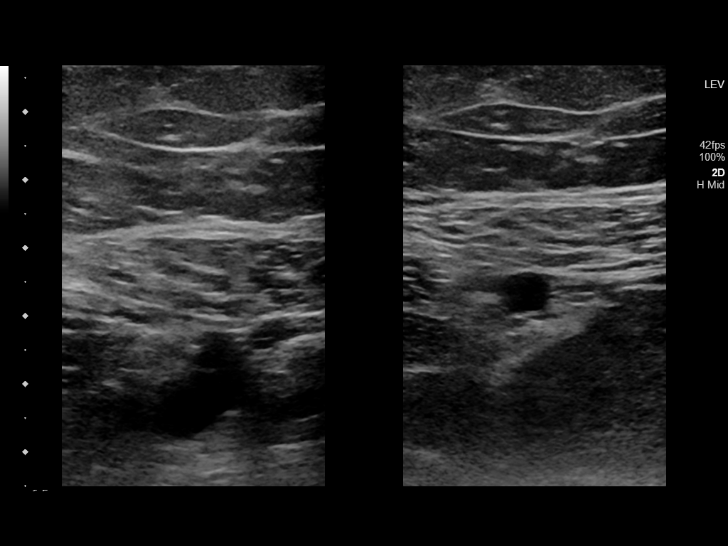
[im 21/37]
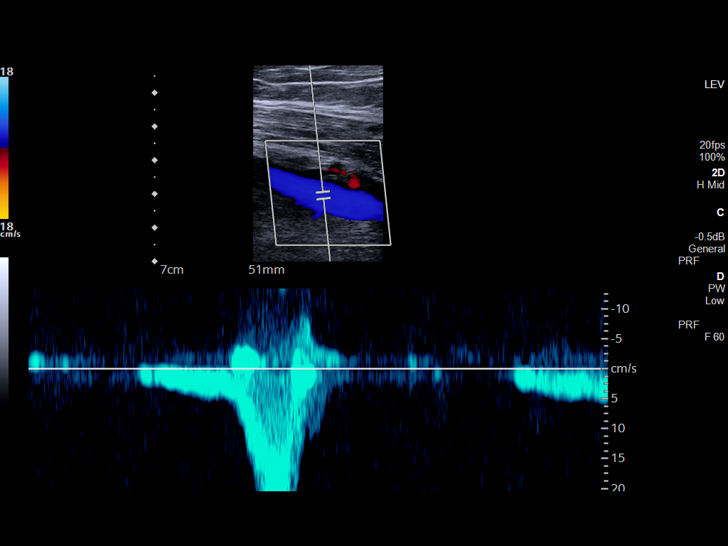
[im 24/37]
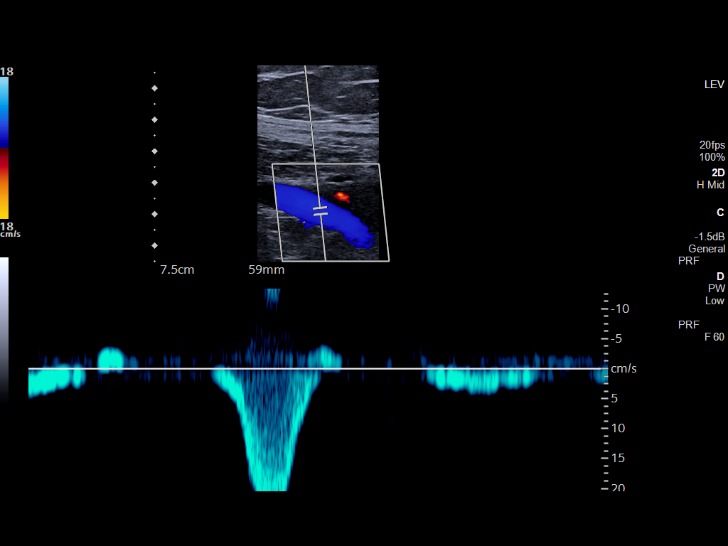
[im 27/37]
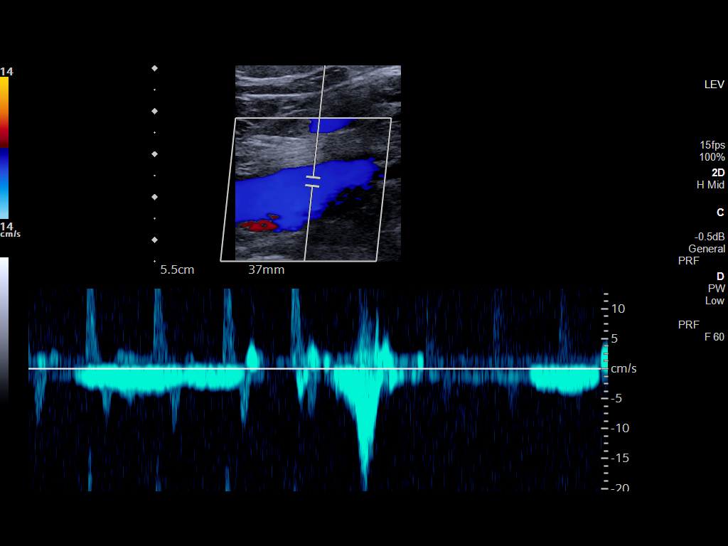
[im 30/37]
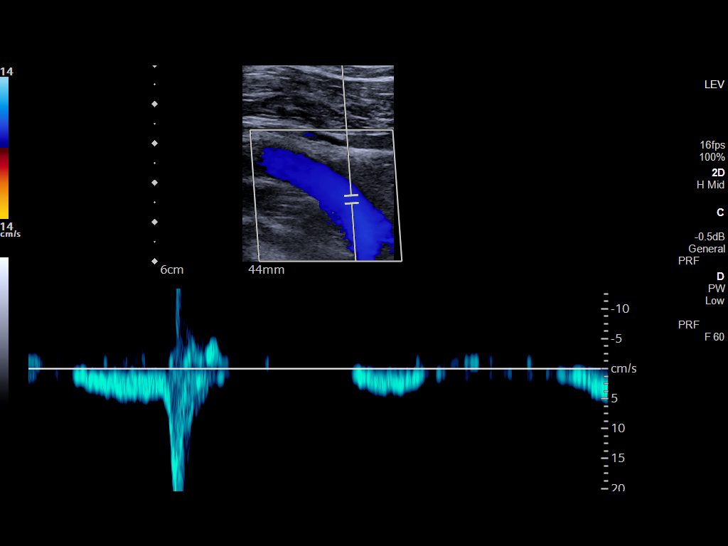
[im 33/37]
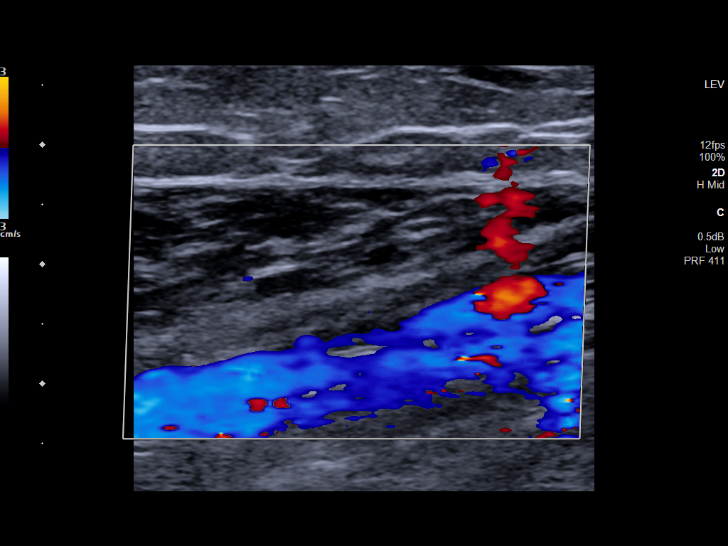
[im 37/37]
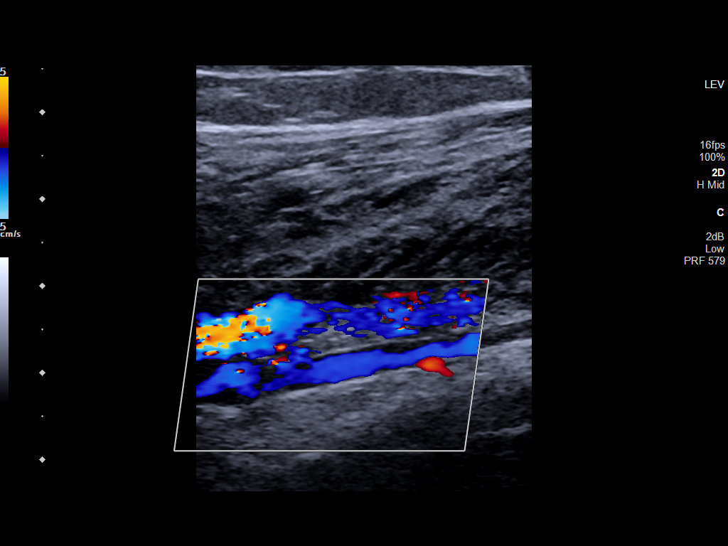

[13 of 24 positions shown; findings below may reference images not displayed]

FINDINGS: Contralateral Common Femoral Vein: Respiratory phasicity is normal
and symmetric with the symptomatic side. No evidence of thrombus.
Normal compressibility.

Common Femoral Vein: No evidence of thrombus. Normal
compressibility, respiratory phasicity and response to augmentation.

Saphenofemoral Junction: No evidence of thrombus. Normal
compressibility and flow on color Doppler imaging.

Profunda Femoral Vein: No evidence of thrombus. Normal
compressibility and flow on color Doppler imaging.

Femoral Vein: No evidence of thrombus. Normal compressibility,
respiratory phasicity and response to augmentation.

Popliteal Vein: No evidence of thrombus. Normal compressibility,
respiratory phasicity and response to augmentation.

Calf Veins: No evidence of thrombus. Normal compressibility and flow
on color Doppler imaging.

Superficial Great Saphenous Vein: No evidence of thrombus. Normal
compressibility.

Other Findings:  None.
IMPRESSION: Negative for deep venous thrombosis in right lower extremity.

## 2022-02-02 ENCOUNTER — Other Ambulatory Visit: Payer: Self-pay

## 2022-02-02 DIAGNOSIS — Z23 Encounter for immunization: Secondary | ICD-10-CM

## 2022-02-03 ENCOUNTER — Other Ambulatory Visit: Payer: Self-pay

## 2022-02-10 ENCOUNTER — Other Ambulatory Visit: Payer: Self-pay

## 2022-02-10 MED FILL — Lancets: 30 days supply | Qty: 100 | Fill #1 | Status: AC

## 2022-02-20 ENCOUNTER — Other Ambulatory Visit: Payer: Self-pay | Admitting: Gerontology

## 2022-02-20 ENCOUNTER — Other Ambulatory Visit: Payer: Self-pay

## 2022-02-20 DIAGNOSIS — I152 Hypertension secondary to endocrine disorders: Secondary | ICD-10-CM

## 2022-02-21 ENCOUNTER — Other Ambulatory Visit: Payer: Self-pay | Admitting: Gerontology

## 2022-02-21 ENCOUNTER — Other Ambulatory Visit: Payer: Self-pay

## 2022-02-22 ENCOUNTER — Other Ambulatory Visit: Payer: Self-pay

## 2022-02-22 ENCOUNTER — Other Ambulatory Visit: Payer: Self-pay | Admitting: Medical

## 2022-02-22 DIAGNOSIS — E1159 Type 2 diabetes mellitus with other circulatory complications: Secondary | ICD-10-CM

## 2022-02-22 MED ORDER — ISOSORBIDE MONONITRATE ER 60 MG PO TB24
ORAL_TABLET | Freq: Two times a day (BID) | ORAL | 3 refills | Status: DC
  Filled 2022-02-22: qty 180, 90d supply, fill #0
  Filled 2022-05-22: qty 180, 90d supply, fill #1
  Filled 2022-08-17: qty 180, 90d supply, fill #2

## 2022-02-23 ENCOUNTER — Other Ambulatory Visit: Payer: Self-pay

## 2022-02-28 ENCOUNTER — Ambulatory Visit: Payer: Self-pay

## 2022-02-28 NOTE — Telephone Encounter (Signed)
Re-filled 02/22/22 by another provider

## 2022-03-16 ENCOUNTER — Other Ambulatory Visit: Payer: Self-pay

## 2022-03-17 ENCOUNTER — Other Ambulatory Visit: Payer: Self-pay

## 2022-03-23 ENCOUNTER — Other Ambulatory Visit: Payer: Self-pay

## 2022-03-24 ENCOUNTER — Other Ambulatory Visit: Payer: Self-pay

## 2022-03-27 ENCOUNTER — Other Ambulatory Visit: Payer: Self-pay

## 2022-04-13 ENCOUNTER — Other Ambulatory Visit: Payer: Self-pay

## 2022-04-17 ENCOUNTER — Other Ambulatory Visit: Payer: Self-pay

## 2022-04-24 ENCOUNTER — Other Ambulatory Visit: Payer: Self-pay

## 2022-04-25 ENCOUNTER — Other Ambulatory Visit: Payer: Self-pay

## 2022-04-26 ENCOUNTER — Ambulatory Visit: Payer: Self-pay | Admitting: Gerontology

## 2022-04-26 ENCOUNTER — Other Ambulatory Visit: Payer: Self-pay

## 2022-04-26 ENCOUNTER — Encounter: Payer: Self-pay | Admitting: Gerontology

## 2022-04-26 VITALS — BP 146/91 | HR 87 | Temp 97.5°F | Resp 16 | Ht 67.0 in | Wt 271.6 lb

## 2022-04-26 DIAGNOSIS — E119 Type 2 diabetes mellitus without complications: Secondary | ICD-10-CM

## 2022-04-26 DIAGNOSIS — Z8679 Personal history of other diseases of the circulatory system: Secondary | ICD-10-CM

## 2022-04-26 DIAGNOSIS — I509 Heart failure, unspecified: Secondary | ICD-10-CM

## 2022-04-26 LAB — POCT GLYCOSYLATED HEMOGLOBIN (HGB A1C): Hemoglobin A1C: 5.5 % (ref 4.0–5.6)

## 2022-04-26 LAB — GLUCOSE, POCT (MANUAL RESULT ENTRY): POC Glucose: 87 mg/dL (ref 70–99)

## 2022-04-26 MED ORDER — METFORMIN HCL 500 MG PO TABS
500.0000 mg | ORAL_TABLET | Freq: Two times a day (BID) | ORAL | 1 refills | Status: DC
  Filled 2022-04-26: qty 180, 90d supply, fill #0

## 2022-04-26 NOTE — Patient Instructions (Signed)

## 2022-04-26 NOTE — Progress Notes (Signed)
Established Patient Office Visit  Subjective   Patient ID: Kathy [name], female    DOB: 1965/04/14  Age: 57 y.o. MRN: [id number]  Chief Complaint  Patient presents with   Follow-up    Patient brought in weight and blood pressure log for review   Diabetes    Patient brought in her blood sugar log     Diabetes Pertinent negatives for diabetes include no chest pain.   Kathy [name]  is a 57 y/o female who has history of Heat failure, Asthma, Hyperlipidemia, Heart Murmur,Hypertension, presents for a follow-up and lab review.  Her blood glucose test was 87 mg/dl and Hgb A1c  decreased from 6.1% to 5.5% today at visit. She also checks her blood glucose at home with 76-110 mg/dL. BP at visit 169/93 and pulse 69, rechecked 146/91 and pulse 87. She states she checked her blood pressure at home too which were between 120-130's/60-80's. She reported she is on health diet and lost 53 Ibs since March last year. She denies polyuria, polydipsia, or tingling/numbness in her extremities. She complaints she still has shoulder pain abduction/adduction/internal/external rotation. She couldn't have a good sleep due to shoulders pain. She is not taking any pain medications for pain. She states she has heart murmur. She has episode of chest pain describing "pitching " and shortness of breath when she performed activities. She is followed by cardiologist this Friday (04/28/2022). Overall, she is doing well and offers no further complaints.    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 02/2016 Echo: EF 45%; b. 03/2016 Echo: EF 55%; c. 12/2019 Echo: EF 55-60%, no rwma, gr2 DD, nl RV size/fxn, mildly dil RA, mild MR.   Asthma    Chronic chest pain    a. ? vasospasm vs endothelial dysfxn. Improved w/ Ranexa but couldn't afford.   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 Presumed in setting of prior h/o MI w/ insignificant coronary dzs.   Diabetes  mellitus without complication (HCC)    Heart murmur    History of NICM (nonischemic cardiomyopathy) ([place])    a. 02/2016 Echo: EF 45%; b. 12/2019 Echo: EF 55-60%.   Hyperlipidemia    Hypertension    Myocardial infarction [facility])    Non-obstructive Coronary artery disease    a. Prior caths in 2007, 2011, 2012 - nonobs dzs; b. 03/2016 Cath ([facility]): LM nl, LAD/LCX/RCA insignificant dzs.   Obesity    Rate-dependent LBBB (left bundle branch block)    Remote Tobacco abuse    a. 30+ pack year history - quit 2019.   Seizures ([place])    Past Surgical History:  Procedure Laterality Date   ANTERIOR CRUCIATE LIGAMENT REPAIR     BACK SURGERY     L4 and L5   CARDIAC CATHETERIZATION  04/09/2016   COLONOSCOPY     COLONOSCOPY WITH PROPOFOL N/A 08/04/2019   Procedure: COLONOSCOPY WITH PROPOFOL;  Surgeon: Lin Landsman, MD;  Location: [facility] ENDOSCOPY;  Service: Gastroenterology;  Laterality: N/A;   EMPYEMA DRAINAGE     at age 2   TONSILLECTOMY     TONSILLECTOMY AND ADENOIDECTOMY  2008   TUBAL LIGATION     Social History   Tobacco Use   Smoking status: Former    Packs/day: 1.00    Years: 30.00    Total pack years: 30.00    Types: Cigarettes    Quit date: 08/16/2017    Years since  quitting: 4.6   Smokeless tobacco: Never  Vaping Use   Vaping Use: Never used  Substance Use Topics   Alcohol use: Not Currently    Comment: last use 01/2022   Drug use: Not Currently    Comment: Previously used cocaine in the 1990s   Family History  Problem Relation Age of Onset   Kidney disease Sister    Diabetes Sister    COPD Sister    Stroke Sister    Thyroid disease Sister    Diabetes Mother    Stroke Mother    Heart attack Mother    Thyroid disease Mother    Colon cancer Mother    Stroke Father    Heart attack Father    Asthma Daughter    Hypertension Daughter    Thyroid disease Daughter    Asthma Son    Heart disease Maternal Grandmother    Hypertension Maternal Grandmother    Colon  cancer Maternal Grandfather    Hypertension Maternal Grandfather    Hyperlipidemia Maternal Grandfather    Stroke Paternal Grandmother    Hypertension Paternal Grandfather    Hyperlipidemia Paternal Grandfather    Heart disease Paternal Grandfather    Thyroid disease Sister    Migraines Sister           Objective:     BP (!) 146/91 (BP Location: Left Arm, Patient Position: Sitting, Cuff Size: Large)   Pulse 87   Temp (!) 97.5 F (36.4 C) (Oral)   Resp 16   Ht '5\' 7"'$  (1.702 m)   Wt 271 lb 9.6 oz (123.2 kg)   LMP 06/09/2018   SpO2 94%   BMI 42.54 kg/m     Physical Exam Constitutional:      Appearance: Normal appearance.  HENT:     Head: Normocephalic.     Right Ear: There is no impacted cerumen.     Left Ear: There is no impacted cerumen.     Nose: Nose normal.     Mouth/Throat:     Mouth: Mucous membranes are moist.  Eyes:     Extraocular Movements: Extraocular movements intact.     Pupils: Pupils are equal, round, and reactive to light.  Cardiovascular:     Heart sounds: Murmur (.History of murmur) heard.  Pulmonary:     Effort: Pulmonary effort is normal.     Breath sounds: Normal breath sounds.  Abdominal:     General: Abdomen is flat.     Palpations: Abdomen is soft.  Genitourinary:    General: Normal vulva.  Musculoskeletal:        General: Normal range of motion.     Cervical back: Normal range of motion and neck supple.  Skin:    General: Skin is warm and dry.  Neurological:     General: No focal deficit present.     Mental Status: She is alert and oriented to person, place, and time.  Psychiatric:        Mood and Affect: Mood normal.      Results for orders placed or performed in visit on 04/26/22  POCT HgB A1C  Result Value Ref Range   Hemoglobin A1C 5.5 4.0 - 5.6 %   HbA1c POC (<> result, manual entry)     HbA1c, POC (prediabetic range)     HbA1c, POC (controlled diabetic range)    POCT Glucose (CBG)  Result Value Ref Range   POC  Glucose 87 70 - 99 mg/dl    Last CBC  Lab Results  Component Value Date   WBC 6.8 01/18/2022   HGB 13.4 01/18/2022   HCT 40.9 01/18/2022   MCV 83 01/18/2022   MCH 27.1 01/18/2022   RDW 15.0 01/18/2022   PLT 304 48/88/9169   Last metabolic panel Lab Results  Component Value Date   GLUCOSE 112 (H) 01/18/2022   NA 140 01/18/2022   K 4.1 01/18/2022   CL 97 01/18/2022   CO2 27 01/18/2022   BUN 10 01/18/2022   CREATININE 0.82 01/18/2022   EGFR 84 01/18/2022   CALCIUM 9.4 01/18/2022   PHOS 4.0 06/21/2020   PROT 7.1 01/18/2022   ALBUMIN 4.4 01/18/2022   LABGLOB 2.7 01/18/2022   AGRATIO 1.6 01/18/2022   BILITOT 0.3 01/18/2022   ALKPHOS 123 (H) 01/18/2022   AST 15 01/18/2022   ALT 22 01/18/2022   ANIONGAP 11 07/21/2020   Last lipids Lab Results  Component Value Date   CHOL 159 01/18/2022   HDL 32 (L) 01/18/2022   LDLCALC 93 01/18/2022   TRIG 198 (H) 01/18/2022   CHOLHDL 5.0 (H) 01/18/2022   Last hemoglobin A1c Lab Results  Component Value Date   HGBA1C 5.5 04/26/2022   Last thyroid functions  Last vitamin D No results found for: "25OHVITD2", "25OHVITD3", "VD25OH"    The 10-year ASCVD risk score (Arnett DK, et al., 2019) is: 8.6%    Assessment & Plan:    1. Type 2 diabetes mellitus without complication, without long-term current use of insulin (HCC) - Her Diabetes is under controlled with taking medications. Continue on current regimen. Gave patient lancets and strips for ACCU check. Educated patient about dash diet. - POCT HgB A1C - POCT Glucose (CBG) - Urine Microalbumin w/creat. ratio; Future - Urine Microalbumin w/creat. ratio - metFORMIN (GLUCOPHAGE) 500 MG tablet; Take 1 tablet (500 mg total) by mouth 2 (two) times daily with a meal.  Dispense: 180 tablet; Refill: 1   2. Congestive heart failure, unspecified HF chronicity, unspecified heart failure type ([place]) She h]has history of heart murmur and congestive heart failure. She is followed by  cardiologist. The next appointment is 04/28/2022.     Return in about 3 months (around 07/26/2022).    Leighton Roach, NP

## 2022-04-27 LAB — MICROALBUMIN / CREATININE URINE RATIO
Creatinine, Urine: 16 mg/dL
Microalb/Creat Ratio: <19 mg/g creat (ref 0–29)
Microalbumin, Urine: <3 ug/mL

## 2022-04-28 ENCOUNTER — Ambulatory Visit: Payer: Self-pay | Admitting: Cardiovascular Disease

## 2022-04-28 ENCOUNTER — Other Ambulatory Visit: Payer: Self-pay

## 2022-04-28 ENCOUNTER — Encounter: Payer: Self-pay | Admitting: Cardiovascular Disease

## 2022-04-28 ENCOUNTER — Ambulatory Visit: Payer: Self-pay | Attending: Cardiovascular Disease | Admitting: Cardiovascular Disease

## 2022-04-28 VITALS — BP 120/84 | HR 79 | Ht 67.0 in | Wt 272.0 lb

## 2022-04-28 DIAGNOSIS — E782 Mixed hyperlipidemia: Secondary | ICD-10-CM

## 2022-04-28 DIAGNOSIS — I25118 Atherosclerotic heart disease of native coronary artery with other forms of angina pectoris: Secondary | ICD-10-CM

## 2022-04-28 DIAGNOSIS — I5032 Chronic diastolic (congestive) heart failure: Secondary | ICD-10-CM

## 2022-04-28 DIAGNOSIS — I10 Essential (primary) hypertension: Secondary | ICD-10-CM

## 2022-04-28 MED ORDER — RANOLAZINE ER 500 MG PO TB12
500.0000 mg | ORAL_TABLET | Freq: Two times a day (BID) | ORAL | 3 refills | Status: DC
  Filled 2022-04-28 – 2022-05-22 (×2): qty 180, 90d supply, fill #0
  Filled 2022-08-17: qty 180, 90d supply, fill #1
  Filled 2022-11-14: qty 180, 90d supply, fill #2
  Filled 2023-02-14: qty 180, 90d supply, fill #3

## 2022-04-28 MED ORDER — CLOPIDOGREL BISULFATE 75 MG PO TABS
75.0000 mg | ORAL_TABLET | Freq: Every day | ORAL | 3 refills | Status: DC
  Filled 2022-04-28 – 2022-05-22 (×2): qty 90, 90d supply, fill #0
  Filled 2022-08-17: qty 90, 90d supply, fill #1
  Filled 2022-11-14: qty 90, 90d supply, fill #2
  Filled 2023-02-14: qty 90, 90d supply, fill #3

## 2022-04-28 MED ORDER — METOPROLOL TARTRATE 25 MG PO TABS
25.0000 mg | ORAL_TABLET | Freq: Two times a day (BID) | ORAL | 3 refills | Status: DC
  Filled 2022-04-28: qty 180, fill #0
  Filled 2022-06-28: qty 180, 90d supply, fill #0
  Filled 2022-09-22: qty 180, 90d supply, fill #1
  Filled 2022-12-20: qty 180, 90d supply, fill #2
  Filled 2023-03-20: qty 180, 90d supply, fill #3

## 2022-04-28 NOTE — Patient Instructions (Signed)
Medication Instructions:  No changes *If you need a refill on your cardiac medications before your next appointment, please call your pharmacy*   Lab Work: None ordered If you have labs (blood work) drawn today and your tests are completely normal, you will receive your results only by: MyChart Message (if you have MyChart) OR A paper copy in the mail If you have any lab test that is abnormal or we need to change your treatment, we will call you to review the results.   Testing/Procedures: Your physician has requested that you have an echocardiogram. Echocardiography is a painless test that uses sound waves to create images of your heart. It provides your doctor with information about the size and shape of your heart and how well your heart's chambers and valves are working.   You may receive an ultrasound enhancing agent through an IV if needed to better visualize your heart during the echo. This procedure takes approximately one hour.  There are no restrictions for this procedure.  This will take place at [address] ([facility] [address], [place] [address]    Follow-Up: At [facility], you and your health needs are our priority.  As part of our continuing mission to provide you with exceptional heart care, we have created designated Provider Care Teams.  These Care Teams include your primary Cardiologist (physician) and Advanced Practice Providers (APPs -  Physician Assistants and Nurse Practitioners) who all work together to provide you with the care you need, when you need it.  We recommend signing up for the patient portal called "MyChart".  Sign up information is provided on this After Visit Summary.  MyChart is used to connect with patients for Virtual Visits (Telemedicine).  Patients are able to view lab/test results, encounter notes, upcoming appointments, etc.  Non-urgent messages can be sent to your provider as well.   To learn more about what you can  do with MyChart, go to [email or web address].    Your next appointment:   6 month(s)  Provider:   You may see Muhammad Arida, MD or one of the following Advanced Practice Providers on your designated Care Team:   Christopher Berge, NP Ryan Dunn, PA-C Cadence Furth, PA-C Sheri Hammock, NP    

## 2022-04-28 NOTE — Progress Notes (Unsigned)
Cardiology Office Note   Date:  05/04/2022   ID:  Kathy [name], DOB 04/03/1966, MRN [id number]  PCP:  Langston Reusing, NP  Cardiologist:   [name], MD   Chief Complaint  Patient presents with   Other    3 month f/u c/o chest discomfort and syncopal spells. Meds reviewed verbally with pt.      History of Present Illness: Kathy [name] is a 57 y.o. female who is here today for follow-up visit regarding chronic chest pain, left bundle branch block and chronic diastolic heart failure.    The patient has known history of recurrent chest pain usually responsive to nitroglycerin which is thought to be due to coronary spasm or endothelial dysfunction.    She had cardiac catheterization done 4 times in the past in 2007, 2011, 2012 and most recently in December 2017 . All these showed mild nonobstructive coronary artery disease.  She is known to have  left bundle branch block.  She is allergic to aspirin and thus on long-term Plavix.  Other medical problems include previous tobacco use, obesity, chronic back pain and hyperlipidemia.  She has family history of coronary artery disease. We tried her on Ranexa last year with significant improvement in symptoms.  However, she could not continue the medication due to cost.    She was hospitalized in March of 2022 with acute on chronic diastolic heart failure in the setting of excessive sodium and fluid intake.  She improved with IV furosemide.  Echocardiogram showed an EF of 50 to 55%.    She was most recently seen in our office in September and was doing reasonably well at that time.  She continues to complain of atypical chest pain with no recent worsening.  She does report worsening exertional dyspnea.   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)    a. 02/2016 Echo: EF 45%; b. 03/2016 Echo: EF 55%; c. 12/2019 Echo: EF 55-60%, no rwma, gr2 DD, nl RV size/fxn, mildly dil RA,  mild MR.   Asthma    Chronic chest pain    a. ? vasospasm vs endothelial dysfxn. Improved w/ Ranexa but couldn't afford.   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 Presumed in setting of prior h/o MI w/ insignificant coronary dzs.   Diabetes mellitus without complication (HCC)    Heart murmur    History of NICM (nonischemic cardiomyopathy) ([place])    a. 02/2016 Echo: EF 45%; b. 12/2019 Echo: EF 55-60%.   Hyperlipidemia    Hypertension    Myocardial infarction [facility])    Non-obstructive Coronary artery disease    a. Prior caths in 2007, 2011, 2012 - nonobs dzs; b. 03/2016 Cath ([facility]): LM nl, LAD/LCX/RCA insignificant dzs.   Obesity    Rate-dependent LBBB (left bundle branch block)    Remote Tobacco abuse    a. 30+ pack year history - quit 2019.   Seizures ([place])     Past Surgical History:  Procedure Laterality Date   ANTERIOR CRUCIATE LIGAMENT REPAIR     BACK SURGERY     L4 and L5   CARDIAC CATHETERIZATION  04/09/2016   COLONOSCOPY     COLONOSCOPY WITH PROPOFOL N/A 08/04/2019   Procedure: COLONOSCOPY WITH PROPOFOL;  Surgeon: Lin Landsman, MD;  Location: [facility] ENDOSCOPY;  Service: Gastroenterology;  Laterality: N/A;   EMPYEMA DRAINAGE     at age 49   TONSILLECTOMY  TONSILLECTOMY AND ADENOIDECTOMY  2008   TUBAL LIGATION       Current Outpatient Medications  Medication Sig Dispense Refill   blood glucose meter kit and supplies KIT Dispense based on patient and insurance preference. Use up to four times daily as directed. (FOR ICD-9 250.00, 250.01). 1 each 0   Blood Pressure Monitoring (BLOOD PRESSURE KIT) KIT 1 kit by Does not apply route daily as needed. 1 kit 0   diphenhydrAMINE (BENADRYL) 50 MG tablet Take 50 mg by mouth 2 (two) times daily.     EPINEPHrine 0.3 mg/0.3 mL IJ SOAJ injection Inject 0.3 into the muscle as needed for anaphylaxis 4 each 6   glucose blood (RIGHTEST GS550 BLOOD GLUCOSE) test strip Use as directed to test blood sugar. 100 each 11   Insulin Pen Needle  32G X 6 MM MISC Use one pen needle with Ozempic once every week 100 each 3   isosorbide mononitrate (IMDUR) 60 MG 24 hr tablet TAKE 1 TABLET BY MOUTH TWICE DAILY. 180 tablet 3   metFORMIN (GLUCOPHAGE) 500 MG tablet Take 1 tablet (500 mg total) by mouth 2 (two) times daily with a meal. 180 tablet 1   nitroGLYCERIN (NITROSTAT) 0.4 MG SL tablet 1 TABLET UNDER TONGUE AS NEEDED FOR CHEST PAIN EVERY 5 MINUTES FOR MAX OF 3 DOSES IN 15 MINUTES; IF NO RELIEF AFTER 1ST DOSE CALL 911 25 tablet 0   pantoprazole (PROTONIX) 40 MG tablet Take 1 tablet (40 mg total) by mouth once daily. 90 tablet 1   potassium chloride SA (KLOR-CON M) 20 MEQ tablet Take 1 tablet (20 mEq total) by mouth 2 (two) times daily. 180 tablet 3   Rightest GL300 Lancets MISC Use as directed to test blood sugar. 100 each 11   Semaglutide, 1 MG/DOSE, (OZEMPIC, 1 MG/DOSE,) 4 MG/3ML SOPN Inject 1 mg into the skin once a week. Instead of Trulicity 3 mL 6   simvastatin (ZOCOR) 20 MG tablet Take 1 tablet (20 mg total) by mouth once daily. 90 tablet 1   spironolactone (ALDACTONE) 25 MG tablet TAKE 1/2 TABLET BY MOUTH ONCE EVERY DAY. 45 tablet 3   torsemide (DEMADEX) 20 MG tablet Take 3 tablets (60 mg) by mouth in the morning and then take 1 tablet (20 mg) by mouth in the evening. 360 tablet 3   clopidogrel (PLAVIX) 75 MG tablet Take 1 tablet (75 mg total) by mouth once daily. 90 tablet 3   metoprolol tartrate (LOPRESSOR) 25 MG tablet TAKE ONE TABLET BY MOUTH 2 TIMES A DAY 180 tablet 3   ranolazine (RANEXA) 500 MG 12 hr tablet Take 1 tablet (500 mg total) by mouth 2 (two) times daily. 180 tablet 3   No current facility-administered medications for this visit.    Allergies:   Aspirin, Beef allergy, Celebrex [celecoxib], Fentanyl, Gabapentin, Hydrocodone-acetaminophen, Latex, Lisinopril, Morphine, Oxycodone-acetaminophen, Percocet [oxycodone-acetaminophen], Pork allergy, Simvastatin, Simvastatin-high dose, Skelaxin [metaxalone], Beef-derived products,  Fluticasone propionate, Pork-derived products, Vicodin [hydrocodone-acetaminophen], and Albuterol    Social History:  The patient  reports that she quit smoking about 4 years ago. Her smoking use included cigarettes. She has a 30.00 pack-year smoking history. She has never used smokeless tobacco. She reports that she does not currently use alcohol. She reports that she does not currently use drugs.   Family History:  The patient's family history includes Asthma in her daughter and son; COPD in her sister; Colon cancer in her maternal grandfather and mother; Diabetes in her mother and sister; Heart attack  in her father and mother; Heart disease in her maternal grandmother and paternal grandfather; Hyperlipidemia in her maternal grandfather and paternal grandfather; Hypertension in her daughter, maternal grandfather, maternal grandmother, and paternal grandfather; Kidney disease in her sister; Migraines in her sister; Stroke in her father, mother, paternal grandmother, and sister; Thyroid disease in her daughter, mother, sister, and sister.    ROS:  Please see the history of present illness.   Otherwise, review of systems are positive for none.   All other systems are reviewed and negative.    PHYSICAL EXAM: VS:  BP 120/84 (BP Location: Left Arm, Patient Position: Sitting, Cuff Size: Large)   Pulse 79   Ht '5\' 7"'$  (1.702 m)   Wt 272 lb (123.4 kg)   LMP 06/09/2018   SpO2 98%   BMI 42.60 kg/m  , BMI Body mass index is 42.6 kg/m. GEN: Well nourished, well developed, in no acute distress  HEENT: normal  Neck: no JVD, carotid bruits, or masses Cardiac: RRR; no  rubs, or gallops .  1/ 6 systolic murmur in the aortic area.  Mild bilateral leg edema Respiratory:  clear to auscultation bilaterally, normal work of breathing GI: soft, nontender, nondistended, + BS MS: no deformity or atrophy  Skin: warm and dry, no rash Neuro:  Strength and sensation are intact Psych: euthymic mood, full  affect   EKG:  EKG is ordered today. The ekg ordered today demonstrates normal sinus rhythm with left bundle branch block.   Recent Labs: 01/18/2022: ALT 22; BUN 10; Creatinine, Ser 0.82; Hemoglobin 13.4; Platelets 304; Potassium 4.1; Sodium 140    Lipid Panel    Component Value Date/Time   CHOL 159 01/18/2022 1216   TRIG 198 (H) 01/18/2022 1216   HDL 32 (L) 01/18/2022 1216   CHOLHDL 5.0 (H) 01/18/2022 1216   CHOLHDL 5.7 02/24/2016 0618   VLDL 30 02/24/2016 0618   LDLCALC 93 01/18/2022 1216      Wt Readings from Last 3 Encounters:  04/28/22 272 lb (123.4 kg)  04/26/22 271 lb 9.6 oz (123.2 kg)  01/26/22 277 lb 4.8 oz (125.8 kg)          02/23/2017    3:01 PM  PAD Screen  Previous PAD dx? No  Previous surgical procedure? No  Pain with walking? Yes  Subsides with rest? No  Feet/toe relief with dangling? No  Painful, non-healing ulcers? No  Extremities discolored? No      ASSESSMENT AND PLAN:  1.  Mild coronary artery disease with other forms of angina:  She has chronic angina thought to be due to coronary spasm or endothelial dysfunction.  Her symptoms are overall stable.  Continue antianginal medications including Imdur and Ranexa.  2.  Chronic diastolic heart failure: She appears to be euvolemic on small dose torsemide and spironolactone.  However, she reports worsening exertional dyspnea and thus I requested an echocardiogram..  3.  Hyperlipidemia: Continue treatment with simvastatin.  Most recent LDL was 93.  4.  Previous tobacco use: No relapse.  5.  Essential hypertension: Blood pressure is controlled.   Disposition:   FU with me in 6 months  Signed,  [name], MD  05/04/2022 9:37 AM    [place]

## 2022-05-01 ENCOUNTER — Other Ambulatory Visit: Payer: Self-pay

## 2022-05-05 NOTE — Addendum Note (Signed)
Addended by: Britt Bottom on: 05/05/2022 01:12 PM   Modules accepted: Orders

## 2022-05-10 ENCOUNTER — Other Ambulatory Visit: Payer: Self-pay | Admitting: Endocrinology

## 2022-05-11 ENCOUNTER — Other Ambulatory Visit: Payer: Self-pay

## 2022-05-11 MED ORDER — OZEMPIC (1 MG/DOSE) 4 MG/3ML ~~LOC~~ SOPN
1.0000 mg | PEN_INJECTOR | SUBCUTANEOUS | 6 refills | Status: DC
  Filled 2022-05-11: qty 12, 112d supply, fill #0
  Filled 2022-08-02: qty 9, 84d supply, fill #1

## 2022-05-22 ENCOUNTER — Other Ambulatory Visit: Payer: Self-pay

## 2022-05-23 ENCOUNTER — Other Ambulatory Visit: Payer: Self-pay

## 2022-05-24 ENCOUNTER — Other Ambulatory Visit: Payer: Self-pay

## 2022-05-31 ENCOUNTER — Other Ambulatory Visit
Admission: RE | Admit: 2022-05-31 | Discharge: 2022-05-31 | Disposition: A | Discharge status: Home / Self Care | Payer: Self-pay | Source: Ambulatory Visit | Attending: Family | Admitting: Family

## 2022-05-31 ENCOUNTER — Ambulatory Visit: Payer: Self-pay | Attending: Family | Admitting: Family

## 2022-05-31 ENCOUNTER — Other Ambulatory Visit: Payer: Self-pay

## 2022-05-31 ENCOUNTER — Encounter: Payer: Self-pay | Admitting: Family

## 2022-05-31 ENCOUNTER — Encounter: Payer: Self-pay | Admitting: Pharmacist

## 2022-05-31 ENCOUNTER — Other Ambulatory Visit (HOSPITAL_COMMUNITY): Payer: Self-pay

## 2022-05-31 VITALS — BP 125/87 | HR 76 | Resp 20 | Wt 271.5 lb

## 2022-05-31 DIAGNOSIS — I5032 Chronic diastolic (congestive) heart failure: Secondary | ICD-10-CM | POA: Insufficient documentation

## 2022-05-31 DIAGNOSIS — E785 Hyperlipidemia, unspecified: Secondary | ICD-10-CM | POA: Insufficient documentation

## 2022-05-31 DIAGNOSIS — E119 Type 2 diabetes mellitus without complications: Secondary | ICD-10-CM | POA: Insufficient documentation

## 2022-05-31 DIAGNOSIS — Z79899 Other long term (current) drug therapy: Secondary | ICD-10-CM | POA: Insufficient documentation

## 2022-05-31 DIAGNOSIS — I10 Essential (primary) hypertension: Secondary | ICD-10-CM

## 2022-05-31 DIAGNOSIS — I110 Hypertensive heart disease with heart failure: Secondary | ICD-10-CM | POA: Insufficient documentation

## 2022-05-31 DIAGNOSIS — I2510 Atherosclerotic heart disease of native coronary artery without angina pectoris: Secondary | ICD-10-CM | POA: Insufficient documentation

## 2022-05-31 LAB — BASIC METABOLIC PANEL
Anion gap: 9 (ref 5–15)
BUN: 14 mg/dL (ref 6–20)
CO2: 28 mmol/L (ref 22–32)
Calcium: 9 mg/dL (ref 8.9–10.3)
Chloride: 101 mmol/L (ref 98–111)
Creatinine, Ser: 0.95 mg/dL (ref 0.44–1.00)
GFR, Estimated: >60 mL/min (ref >60)
Glucose, Bld: 90 mg/dL (ref 70–99)
Potassium: 3.6 mmol/L (ref 3.5–5.1)
Sodium: 138 mmol/L (ref 135–145)

## 2022-05-31 MED ORDER — DAPAGLIFLOZIN PROPANEDIOL 10 MG PO TABS
10.0000 mg | ORAL_TABLET | Freq: Every day | ORAL | 5 refills | Status: DC
  Filled 2022-05-31: qty 30, 30d supply, fill #0

## 2022-05-31 NOTE — Patient Instructions (Addendum)
Continue weighing daily and call for an overnight weight gain of 3 pounds or more or a weekly weight gain of more than 5 pounds.  If you have voicemail, please make sure your mailbox is cleaned out so that we may leave a message and please make sure to listen to any voicemails.    Stop taking metformin   Begin taking farxiga as 1 tablet every morning and continue to monitor your blood sugars.

## 2022-05-31 NOTE — Progress Notes (Signed)
Patient ID: Kathy [name], female    DOB: 1965/04/20, 57 y.o.   MRN: [id number]  Kathy [name] is a 57 y/o female with a history of asthma, CAD, hyperlipidemia, HTN, seizures, DM, previous tobacco use and chronic heart failure.   Echo report from 06/14/20 reviewed and showed an EF of 50-55% along with mild LVH. Echo report from 01/06/20 reviewed and showed an EF of 55-60% along with mild LVH and mild MR. Echo report from 02/27/17 reviewed and showed an EF of 55-60%.  Has not been admitted or been in the ED in the last 6 months.   She presents today for a HF follow up visit with a chief complaint of minimal fatigue with moderate exertion. Describes this as chronic in nature. Has associated chest tightness, SOB, chronic pain and chronic difficulty sleeping along with this. Denies any abdominal distention, palpitations, pedal edema, chest pain, cough, dizziness or weight gain.   Has been taking ozempic and reports good weight loss.   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 02/2016 Echo: EF 45%; b. 03/2016 Echo: EF 55%; c. 12/2019 Echo: EF 55-60%, no rwma, gr2 DD, nl RV size/fxn, mildly dil RA, mild MR.   Asthma    Chronic chest pain    a. ? vasospasm vs endothelial dysfxn. Improved w/ Ranexa but couldn't afford.   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 Presumed in setting of prior h/o MI w/ insignificant coronary dzs.   Diabetes mellitus without complication (HCC)    Heart murmur    History of NICM (nonischemic cardiomyopathy) ([place])    a. 02/2016 Echo: EF 45%; b. 12/2019 Echo: EF 55-60%.   Hyperlipidemia    Hypertension    Myocardial infarction [facility]    Non-obstructive Coronary artery disease    a. Prior caths in 2007, 2011, 2012 - nonobs dzs; b. 03/2016 Cath ([facility]): LM nl, LAD/LCX/RCA insignificant dzs.   Obesity    Rate-dependent LBBB (left bundle branch block)    Remote Tobacco abuse    a. 30+ pack year history - quit 2019.    Seizures ([place])    Past Surgical History:  Procedure Laterality Date   ANTERIOR CRUCIATE LIGAMENT REPAIR     BACK SURGERY     L4 and L5   CARDIAC CATHETERIZATION  04/09/2016   COLONOSCOPY     COLONOSCOPY WITH PROPOFOL N/A 08/04/2019   Procedure: COLONOSCOPY WITH PROPOFOL;  Surgeon: Lin Landsman, MD;  Location: [facility] ENDOSCOPY;  Service: Gastroenterology;  Laterality: N/A;   EMPYEMA DRAINAGE     at age 35   TONSILLECTOMY     TONSILLECTOMY AND ADENOIDECTOMY  2008   TUBAL LIGATION     Family History  Problem Relation Age of Onset   Kidney disease Sister    Diabetes Sister    COPD Sister    Stroke Sister    Thyroid disease Sister    Diabetes Mother    Stroke Mother    Heart attack Mother    Thyroid disease Mother    Colon cancer Mother    Stroke Father    Heart attack Father    Asthma Daughter    Hypertension Daughter    Thyroid disease Daughter    Asthma Son    Heart disease Maternal Grandmother    Hypertension Maternal Grandmother    Colon cancer Maternal Grandfather    Hypertension Maternal Grandfather    Hyperlipidemia Maternal Grandfather  Stroke Paternal Grandmother    Hypertension Paternal Grandfather    Hyperlipidemia Paternal Grandfather    Heart disease Paternal Grandfather    Thyroid disease Sister    Migraines Sister    Social History   Tobacco Use   Smoking status: Former    Packs/day: 1.00    Years: 30.00    Total pack years: 30.00    Types: Cigarettes    Quit date: 08/16/2017    Years since quitting: 4.7   Smokeless tobacco: Never  Substance Use Topics   Alcohol use: Not Currently    Comment: last use 01/2022   Allergies  Allergen Reactions   Aspirin Anaphylaxis, Hives and Swelling    Occurred as a child Occurred as a child   Beef Allergy Hives   Celebrex [Celecoxib] Anaphylaxis and Hives   Fentanyl Anaphylaxis   Gabapentin Shortness Of Breath   Hydrocodone-Acetaminophen Hives, Other (See Comments) and Swelling    Feels like her  tongue swells. Trouble swallowing   Latex Hives   Lisinopril Shortness Of Breath   Morphine Anaphylaxis and Swelling   Oxycodone-Acetaminophen Hives and Shortness Of Breath   Percocet [Oxycodone-Acetaminophen] Hives and Shortness Of Breath   Pork Allergy Hives   Simvastatin Hives and Shortness Of Breath    Can tolerate 20 mg dose.   Simvastatin-High Dose Hives and Shortness Of Breath    Can tolerate 20 mg dose.    Skelaxin [Metaxalone] Anaphylaxis   Beef-Derived Products Hives   Fluticasone Propionate    Pork-Derived Products Hives   Vicodin [Hydrocodone-Acetaminophen] Hives and Other (See Comments)    Feels like her tongue swells. Trouble swallowing   Albuterol Palpitations   Prior to Admission medications   Medication Sig Start Date End Date Taking? Authorizing Provider  blood glucose meter kit and supplies KIT Dispense based on patient and insurance preference. Use up to four times daily as directed. (FOR ICD-9 250.00, 250.01). 03/02/20  Yes Iloabachie, Chioma E, NP  Blood Pressure Monitoring (BLOOD PRESSURE KIT) KIT 1 kit by Does not apply route daily as needed. 01/26/22  Yes Iloabachie, Chioma E, NP  clopidogrel (PLAVIX) 75 MG tablet Take 1 tablet (75 mg total) by mouth once daily. 04/28/22  Yes Wellington Hampshire, MD  diphenhydrAMINE (BENADRYL) 50 MG tablet Take 50 mg by mouth 2 (two) times daily.   Yes [provider]  glucose blood (RIGHTEST GS550 BLOOD GLUCOSE) test strip Use as directed to test blood sugar. 05/16/21  Yes Cammy Copa, RPH  Insulin Pen Needle 32G X 6 MM MISC Use one pen needle with Ozempic once every week 11/03/21  Yes Iloabachie, Chioma E, NP  isosorbide mononitrate (IMDUR) 60 MG 24 hr tablet TAKE 1 TABLET BY MOUTH TWICE DAILY. 02/22/22  Yes Furth, Cadence H, PA-C  metFORMIN (GLUCOPHAGE) 500 MG tablet Take 1 tablet (500 mg total) by mouth 2 (two) times daily with a meal. 04/26/22  Yes Iloabachie, Chioma E, NP  metoprolol tartrate (LOPRESSOR) 25 MG tablet  TAKE ONE TABLET BY MOUTH 2 TIMES A DAY 04/28/22 04/28/23 Yes Arida, Mertie Clause, MD  nitroGLYCERIN (NITROSTAT) 0.4 MG SL tablet 1 TABLET UNDER TONGUE AS NEEDED FOR CHEST PAIN EVERY 5 MINUTES FOR MAX OF 3 DOSES IN 15 MINUTES; IF NO RELIEF AFTER 1ST DOSE CALL 911 05/13/19  Yes McGowan, Shannon A, PA-C  pantoprazole (PROTONIX) 40 MG tablet Take 1 tablet (40 mg total) by mouth once daily. 01/26/22  Yes Iloabachie, Chioma E, NP  potassium chloride SA (KLOR-CON M) 20  MEQ tablet Take 1 tablet (20 mEq total) by mouth 2 (two) times daily. 09/07/21  Yes Gwynevere Lizana, Otila Kluver A, FNP  ranolazine (RANEXA) 500 MG 12 hr tablet Take 1 tablet (500 mg total) by mouth 2 (two) times daily. 04/28/22  Yes Wellington Hampshire, MD  Rightest GL300 Lancets MISC Use as directed to test blood sugar. 11/08/21  Yes Iloabachie, Chioma E, NP  Semaglutide, 1 MG/DOSE, (OZEMPIC, 1 MG/DOSE,) 4 MG/3ML SOPN Inject 1 mg into the skin once a week. Instead of Trulicity 123XX123  Yes Iloabachie, Chioma E, NP  simvastatin (ZOCOR) 20 MG tablet Take 1 tablet (20 mg total) by mouth once daily. 01/26/22  Yes Iloabachie, Chioma E, NP  spironolactone (ALDACTONE) 25 MG tablet TAKE 1/2 TABLET BY MOUTH ONCE EVERY DAY. 11/29/21 08/27/22 Yes Glenda Kunst, Aura Fey, FNP  torsemide (DEMADEX) 20 MG tablet Take 3 tablets (60 mg) by mouth in the morning and then take 1 tablet (20 mg) by mouth in the evening. 10/24/21  Yes Darylene Price A, FNP  EPINEPHrine 0.3 mg/0.3 mL IJ SOAJ injection Inject 0.3 into the muscle as needed for anaphylaxis Patient not taking: Reported on 05/31/2022 08/16/21   Caryl Asp E, NP   Review of Systems  Constitutional:  Positive for fatigue ("very little"). Negative for appetite change.  HENT:  Negative for congestion, postnasal drip and sore throat.   Eyes: Negative.   Respiratory:  Positive for chest tightness (at times in left upper chest) and shortness of breath (with exertion). Negative for apnea and cough.   Cardiovascular:  Negative for chest pain,  palpitations and leg swelling.  Gastrointestinal:  Negative for abdominal distention and abdominal pain.  Endocrine: Negative.   Genitourinary: Negative.   Musculoskeletal:  Positive for arthralgias (both knees) and back pain. Negative for neck pain.  Skin: Negative.   Allergic/Immunologic: Positive for food allergies (Had allergic reaction to pork rinds).  Neurological:  Negative for dizziness and light-headedness.  Hematological:  Negative for adenopathy. Does not bruise/bleed easily.  Psychiatric/Behavioral:  Positive for sleep disturbance (sleeping on 1 pillow). Negative for dysphoric mood. The patient is not nervous/anxious.    Vitals:   05/31/22 1248  BP: 125/87  Pulse: 76  Resp: 20  Weight: 271 lb 8 oz (123.2 kg)   Wt Readings from Last 3 Encounters:  05/31/22 271 lb 8 oz (123.2 kg)  04/28/22 272 lb (123.4 kg)  04/26/22 271 lb 9.6 oz (123.2 kg)   Lab Results  Component Value Date   CREATININE 0.82 01/18/2022   CREATININE 0.77 12/21/2020   CREATININE 0.77 07/21/2020   Physical Exam Vitals and nursing note reviewed.  Constitutional:      General: She is not in acute distress.    Appearance: Normal appearance. She is well-developed. She is not ill-appearing, toxic-appearing or diaphoretic.  HENT:     Head: Normocephalic and atraumatic.  Neck:     Vascular: No JVD.  Cardiovascular:     Rate and Rhythm: Normal rate and regular rhythm.     Heart sounds: Normal heart sounds.  Pulmonary:     Effort: Pulmonary effort is normal. No respiratory distress.     Breath sounds: No wheezing, rhonchi or rales.  Abdominal:     Palpations: Abdomen is soft.     Tenderness: There is no abdominal tenderness.  Musculoskeletal:        General: No tenderness.     Cervical back: Neck supple.     Right lower leg: No tenderness. No edema.  Left lower leg: No tenderness. No edema.  Skin:    General: Skin is warm and dry.  Neurological:     General: No focal deficit present.      Mental Status: She is alert and oriented to person, place, and time.  Psychiatric:        Mood and Affect: Mood normal.        Behavior: Behavior normal.     Assessment & Plan:  1: Chronic heart failure with preserved ejection fraction with LVH- - NYHA class II - euvolemic today  - weighing daily; reminded to call for an overnight weight gain of >2 pounds or a weekly weight gain of >5 pounds - weight down 13 pounds from last visit here 6 months ago - has been following a daily 2L fluid intake along with closely following a 2092m sodium diet - has upcoming echo on 06/08/22 - saw cardiology (Fletcher Anon 04/28/22 - isosorbide MN 617mdaily - metoprolol tartrate 2535mID - spironolactone 61m47mily - torsemide 60mg84m/ 20mg 6m potassium 20meq 61m- add farxiga 10mg da7m 30 day voucher provided - BMP next visit - BNP 06/12/20 was 45.8 - PharmD reconciled meds w/ patient  2: HTN- - BP 125/87 - saw PCP (llobachie NP) 04/26/22 at [facility]0/11/23 reviewed and showed sodium 140, potassium 4.1, creatinine 0.82 and GFR 84  3: DM- - A1c 04/26/22 was 5.5% - ozempic 1mg week10m- with adding farxiga, stop metformin and continue to check glucose levels   Medication bottles reviewed with patient.   Return in 1 month, sooner if needed

## 2022-06-01 ENCOUNTER — Other Ambulatory Visit: Payer: Self-pay

## 2022-06-01 NOTE — Progress Notes (Signed)
Patient ID: Kathy [name], female   DOB: July 20, 1965, 57 y.o.   MRN: [id number]  [place] - PHARMACIST COUNSELING NOTE  *HFpEF*  Guideline-Directed Medical Therapy/Evidence Based Medicine  ACE/ARB/ARNI: N/A - reports SBP with lisinopril Beta Blocker: Metoprolol tartrate 25 mg twice daily Aldosterone Antagonist: Spironolactone 12.5 mg daily Diuretic: Torsemide 60 mg in AM and '20mg'$  in PM SGLT2i:  none  Adherence Assessment  Do you ever forget to take your medication? '[]'$ Yes '[x]'$ No  Do you ever skip doses due to side effects? '[]'$ Yes '[x]'$ No  Do you have trouble affording your medicines? '[]'$ Yes '[x]'$ No  Are you ever unable to pick up your medication due to transportation difficulties? '[]'$ Yes '[x]'$ No  Do you ever stop taking your medications because you don't believe they are helping? '[]'$ Yes '[x]'$ No  Do you check your weight daily? '[x]'$ Yes '[]'$ No   Adherence strategy: pill box  Barriers to obtaining medications: none  Vital signs: HR 76, BP 125/87, weight (pounds) 271 lbs  ECHO: Date 06/14/20, EF of 50-55% along with mild LVH      Latest Ref Rng & Units 05/31/2022    1:57 PM 01/18/2022   12:14 PM 12/21/2020   11:33 AM  BMP  Glucose 70 - 99 mg/dL 90  112  125   BUN 6 - 20 mg/dL '14  10  10   '$ Creatinine 0.44 - 1.00 mg/dL 0.95  0.82  0.77   BUN/Creat Ratio 9 - '23  12  13   '$ Sodium 135 - 145 mmol/L 138  140  142   Potassium 3.5 - 5.1 mmol/L 3.6  4.1  4.7   Chloride 98 - 111 mmol/L 101  97  99   CO2 22 - 32 mmol/L '28  27  29   '$ Calcium 8.9 - 10.3 mg/dL 9.0  9.4  9.4     Past Medical History:  Diagnosis Date   (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 02/2016 Echo: EF 45%; b. 03/2016 Echo: EF 55%; c. 12/2019 Echo: EF 55-60%, no rwma, gr2 DD, nl RV size/fxn, mildly dil RA, mild MR.   Asthma    Chronic chest pain    a. ? vasospasm vs endothelial dysfxn. Improved w/ Ranexa but couldn't afford.   Coronary vasospasm  (presumed)    a. Presumed in setting of prior h/o MI w/ insignificant coronary dzs.   Diabetes mellitus without complication (HCC)    Heart murmur    History of NICM (nonischemic cardiomyopathy) ([place])    a. 02/2016 Echo: EF 45%; b. 12/2019 Echo: EF 55-60%.   Hyperlipidemia    Hypertension    Myocardial infarction [facility])    Non-obstructive Coronary artery disease    a. Prior caths in 2007, 2011, 2012 - nonobs dzs; b. 03/2016 Cath ([facility]): LM nl, LAD/LCX/RCA insignificant dzs.   Obesity    Rate-dependent LBBB (left bundle branch block)    Remote Tobacco abuse    a. 30+ pack year history - quit 2019.   Seizures [facility])     ASSESSMENT 57 year old female who presents to the HF clinic for follow up. PMH includes asthma, CAD, hyperlipidemia, HTN, seizures, DM, previous tobacco use and chronic heart failure. Patient reports SOB with moderate physical activity , but stable. Noted wt down from previous OV and no acute care admission on the last 6 months.   Medication reconciliation complete,. Patient denies ADR or issues to afford current therapy. She is asking to adjust  Ozempic dose but NP will defer dose change to prescribing provider instead.  PLAN  Add SGLT21 to complete GDMT (A1C = 5.5%). Patietn instructed to check BG regularly and report any s/sx of hypoglycemia Complete ECHO as ordered (scheduled for 06/08/22) BMET in 3-4 weeks   Time spent: 15 minutes  Kessa Fairbairn Rodriguez-Guzman PharmD, BCPS 06/01/2022 2:10 PM    Current Outpatient Medications:    blood glucose meter kit and supplies KIT, Dispense based on patient and insurance preference. Use up to four times daily as directed. (FOR ICD-9 250.00, 250.01)., Disp: 1 each, Rfl: 0   Blood Pressure Monitoring (BLOOD PRESSURE KIT) KIT, 1 kit by Does not apply route daily as needed., Disp: 1 kit, Rfl: 0   clopidogrel (PLAVIX) 75 MG tablet, Take 1 tablet (75 mg total) by mouth once daily., Disp: 90 tablet, Rfl: 3   dapagliflozin propanediol  (FARXIGA) 10 MG TABS tablet, Take 1 tablet (10 mg total) by mouth daily before breakfast., Disp: 30 tablet, Rfl: 5   diphenhydrAMINE (BENADRYL) 50 MG tablet, Take 50 mg by mouth 2 (two) times daily., Disp: , Rfl:    EPINEPHrine 0.3 mg/0.3 mL IJ SOAJ injection, Inject 0.3 into the muscle as needed for anaphylaxis (Patient not taking: Reported on 05/31/2022), Disp: 4 each, Rfl: 6   glucose blood (RIGHTEST GS550 BLOOD GLUCOSE) test strip, Use as directed to test blood sugar., Disp: 100 each, Rfl: 11   Insulin Pen Needle 32G X 6 MM MISC, Use one pen needle with Ozempic once every week, Disp: 100 each, Rfl: 3   isosorbide mononitrate (IMDUR) 60 MG 24 hr tablet, TAKE 1 TABLET BY MOUTH TWICE DAILY., Disp: 180 tablet, Rfl: 3   metoprolol tartrate (LOPRESSOR) 25 MG tablet, TAKE ONE TABLET BY MOUTH 2 TIMES A DAY, Disp: 180 tablet, Rfl: 3   nitroGLYCERIN (NITROSTAT) 0.4 MG SL tablet, 1 TABLET UNDER TONGUE AS NEEDED FOR CHEST PAIN EVERY 5 MINUTES FOR MAX OF 3 DOSES IN 15 MINUTES; IF NO RELIEF AFTER 1ST DOSE CALL 911, Disp: 25 tablet, Rfl: 0   pantoprazole (PROTONIX) 40 MG tablet, Take 1 tablet (40 mg total) by mouth once daily., Disp: 90 tablet, Rfl: 1   potassium chloride SA (KLOR-CON M) 20 MEQ tablet, Take 1 tablet (20 mEq total) by mouth 2 (two) times daily., Disp: 180 tablet, Rfl: 3   ranolazine (RANEXA) 500 MG 12 hr tablet, Take 1 tablet (500 mg total) by mouth 2 (two) times daily., Disp: 180 tablet, Rfl: 3   Rightest GL300 Lancets MISC, Use as directed to test blood sugar., Disp: 100 each, Rfl: 11   Semaglutide, 1 MG/DOSE, (OZEMPIC, 1 MG/DOSE,) 4 MG/3ML SOPN, Inject 1 mg into the skin once a week. Instead of Trulicity, Disp: 3 mL, Rfl: 6   simvastatin (ZOCOR) 20 MG tablet, Take 1 tablet (20 mg total) by mouth once daily., Disp: 90 tablet, Rfl: 1   spironolactone (ALDACTONE) 25 MG tablet, TAKE 1/2 TABLET BY MOUTH ONCE EVERY DAY., Disp: 45 tablet, Rfl: 3   torsemide (DEMADEX) 20 MG tablet, Take 3 tablets (60  mg) by mouth in the morning and then take 1 tablet (20 mg) by mouth in the evening., Disp: 360 tablet, Rfl: 3    MEDICATION ADHERENCES TIPS AND STRATEGIES Taking medication as prescribed improves patient outcomes in heart failure (reduces hospitalizations, improves symptoms, increases survival) Side effects of medications can be managed by decreasing doses, switching agents, stopping drugs, or adding additional therapy. Please let someone in the [facility]  know if you have having bothersome side effects so we can modify your regimen. Do not alter your medication regimen without talking to [place].  Medication reminders can help patients remember to take drugs on time. If you are missing or forgetting doses you can try linking behaviors, using pill boxes, or an electronic reminder like an alarm on your phone or an app. Some people can also get automated phone calls as medication reminders.

## 2022-06-02 ENCOUNTER — Other Ambulatory Visit: Payer: Self-pay

## 2022-06-05 ENCOUNTER — Telehealth: Payer: Self-pay | Admitting: Family

## 2022-06-05 ENCOUNTER — Other Ambulatory Visit: Payer: Self-pay

## 2022-06-05 NOTE — Telephone Encounter (Signed)
Patient LVM stating that for her new "heart medicine"  that was prescribed by Darylene Price, FNP, there is a [place] wide system down for the coupon that she was given. The pharmacist recommended to check if we could give samples. (Patient did not leave the name of medication). Please advise.

## 2022-06-05 NOTE — Telephone Encounter (Signed)
Medication Samples have been provided to the patient.  Drug name: Jardiance       Strength: 10 mg        Qty: 4  [id number]  Exp.Date: Jan/2026  Dosing instructions: take 10 mg (one tablet) daily before breakfast.  The patient has been instructed regarding the correct time, dose, and frequency of taking this medication, including desired effects and most common side effects.   Julianne Handler 4:32 PM 06/05/2022

## 2022-06-07 ENCOUNTER — Other Ambulatory Visit: Payer: Self-pay

## 2022-06-07 MED ORDER — EMPAGLIFLOZIN 10 MG PO TABS: 10.0000 mg | ORAL_TABLET | Freq: Every day | ORAL | 3 refills | Status: DC

## 2022-06-08 ENCOUNTER — Telehealth: Payer: Self-pay | Admitting: *Deleted

## 2022-06-08 ENCOUNTER — Ambulatory Visit: Payer: Self-pay | Attending: Cardiovascular Disease

## 2022-06-08 DIAGNOSIS — I5032 Chronic diastolic (congestive) heart failure: Secondary | ICD-10-CM

## 2022-06-08 LAB — ECHOCARDIOGRAM COMPLETE
AR max vel: 2.61 cm2
AV Area VTI: 2.68 cm2
AV Area mean vel: 2.43 cm2
AV Mean grad: 5 mmHg
AV Peak grad: 9.5 mmHg
Ao pk vel: 1.54 m/s
Area-P 1/2: 3.4 cm2
Calc EF: 43.3 %
S' Lateral: 3.4 cm
Single Plane A2C EF: 43.8 %
Single Plane A4C EF: 42.7 %

## 2022-06-08 NOTE — Telephone Encounter (Signed)
Pt called stating since starting jardiance she's experienced headaches, nausea, and noticed she's very thirsty. Pt did not take jardiance today and wants to know if she can discontinue medication.   Routed to [employer] for advice

## 2022-06-08 NOTE — Telephone Encounter (Signed)
Pt aware.

## 2022-06-23 ENCOUNTER — Ambulatory Visit: Payer: Self-pay | Attending: Cardiovascular Disease | Admitting: Cardiovascular Disease

## 2022-06-23 ENCOUNTER — Encounter: Payer: Self-pay | Admitting: Cardiovascular Disease

## 2022-06-23 ENCOUNTER — Other Ambulatory Visit
Admission: RE | Admit: 2022-06-23 | Discharge: 2022-06-23 | Disposition: A | Discharge status: Home / Self Care | Payer: Self-pay | Source: Ambulatory Visit | Attending: Cardiovascular Disease | Admitting: Cardiovascular Disease

## 2022-06-23 VITALS — BP 110/70 | HR 72 | Ht 67.0 in | Wt 275.5 lb

## 2022-06-23 DIAGNOSIS — I5032 Chronic diastolic (congestive) heart failure: Secondary | ICD-10-CM | POA: Insufficient documentation

## 2022-06-23 DIAGNOSIS — E782 Mixed hyperlipidemia: Secondary | ICD-10-CM

## 2022-06-23 DIAGNOSIS — I5022 Chronic systolic (congestive) heart failure: Secondary | ICD-10-CM

## 2022-06-23 DIAGNOSIS — I10 Essential (primary) hypertension: Secondary | ICD-10-CM | POA: Insufficient documentation

## 2022-06-23 DIAGNOSIS — I25118 Atherosclerotic heart disease of native coronary artery with other forms of angina pectoris: Secondary | ICD-10-CM

## 2022-06-23 LAB — CBC
HCT: 43.1 % (ref 36.0–46.0)
Hemoglobin: 14 g/dL (ref 12.0–15.0)
MCH: 28.3 pg (ref 26.0–34.0)
MCHC: 32.5 g/dL (ref 30.0–36.0)
MCV: 87.2 fL (ref 80.0–100.0)
Platelets: 282 10*3/uL (ref 150–400)
RBC: 4.94 MIL/uL (ref 3.87–5.11)
RDW: 14.2 % (ref 11.5–15.5)
WBC: 7.8 10*3/uL (ref 4.0–10.5)
nRBC: 0 % (ref 0.0–0.2)

## 2022-06-23 LAB — BASIC METABOLIC PANEL
Anion gap: 8 (ref 5–15)
BUN: 13 mg/dL (ref 6–20)
CO2: 26 mmol/L (ref 22–32)
Calcium: 9 mg/dL (ref 8.9–10.3)
Chloride: 103 mmol/L (ref 98–111)
Creatinine, Ser: 0.87 mg/dL (ref 0.44–1.00)
GFR, Estimated: >60 mL/min (ref >60)
Glucose, Bld: 103 mg/dL — ABNORMAL HIGH (ref 70–99)
Potassium: 3.8 mmol/L (ref 3.5–5.1)
Sodium: 137 mmol/L (ref 135–145)

## 2022-06-23 MED ORDER — SODIUM CHLORIDE 0.9% FLUSH: 3.0000 mL | Freq: Two times a day (BID) | INTRAVENOUS | Status: DC

## 2022-06-23 NOTE — Progress Notes (Signed)
Cardiology Office Note   Date:  06/23/2022   ID:  Kathy [name], DOB 20-Jul-1965, MRN [id number]  PCP:  Kathy Reusing, NP  Cardiologist:   Kathy Sacramento, MD   Chief Complaint  Patient presents with   Other    Possible cath c/o fluctuating BP, fluid retention and weight gain. Meds reviewed verbally with pt.      History of Present Illness: Kathy [name] is a 57 y.o. female who is here today for follow-up visit regarding chronic chest pain, left bundle branch block and chronic diastolic heart failure.    The patient has known history of recurrent chest pain usually responsive to nitroglycerin which is thought to be due to coronary spasm or endothelial dysfunction.    She had cardiac catheterization done 4 times in the past in 2007, 2011, 2012 and most recently in December 2017 . All these showed mild nonobstructive coronary artery disease.  She is known to have  left bundle branch block.  She is allergic to aspirin and thus on long-term Plavix.  Other medical problems include previous tobacco use, obesity, chronic back pain and hyperlipidemia.  She has family history of coronary artery disease.  She was hospitalized in March of 2022 with acute on chronic diastolic heart failure in the setting of excessive sodium and fluid intake.  She improved with IV furosemide.  Echocardiogram showed an EF of 50 to 55%.    She was seen recently and reported worsening exertional dyspnea.  She had an echocardiogram done last month which showed an EF of 40 to 45%, mild pulmonary hypertension and mild to moderate mitral regurgitation.  She reports significant worsening of exertional dyspnea and orthopnea.  She did not tolerate Jardiance due to itching and palpitations.  Past Medical History:  Diagnosis Date   (HFimpEF) heart failure with improved ejection fraction ([place])    a. 02/2016 Echo: EF 45%; b. 03/2016 Echo: EF 55%; c. 12/2019 Echo: EF 55-60%, no rwma, gr2 DD, nl RV size/fxn, mildly dil RA,  mild MR.   Asthma    Chronic chest pain    a. ? vasospasm vs endothelial dysfxn. Improved w/ Ranexa but couldn't afford.   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 Presumed in setting of prior h/o MI w/ insignificant coronary dzs.   Diabetes mellitus without complication (HCC)    Heart murmur    History of NICM (nonischemic cardiomyopathy) ([place])    a. 02/2016 Echo: EF 45%; b. 12/2019 Echo: EF 55-60%.   Hyperlipidemia    Hypertension    Myocardial infarction [facility])    Non-obstructive Coronary artery disease    a. Prior caths in 2007, 2011, 2012 - nonobs dzs; b. 03/2016 Cath ([facility]): LM nl, LAD/LCX/RCA insignificant dzs.   Obesity    Rate-dependent LBBB (left bundle branch block)    Remote Tobacco abuse    a. 30+ pack year history - quit 2019.   Seizures ([place])     Past Surgical History:  Procedure Laterality Date   ANTERIOR CRUCIATE LIGAMENT REPAIR     BACK SURGERY     L4 and L5   CARDIAC CATHETERIZATION  04/09/2016   COLONOSCOPY     COLONOSCOPY WITH PROPOFOL N/A 08/04/2019   Procedure: COLONOSCOPY WITH PROPOFOL;  Surgeon: Kathy Landsman, MD;  Location[facility] ENDOSCOPY;  Service: Gastroenterology;  Laterality: N/A;   EMPYEMA DRAINAGE     at age 32   TONSILLECTOMY     TONSILLECTOMY AND ADENOIDECTOMY  2008   TUBAL  LIGATION       Current Outpatient Medications  Medication Sig Dispense Refill   blood glucose meter kit and supplies KIT Dispense based on patient and insurance preference. Use up to four times daily as directed. (FOR ICD-9 250.00, 250.01). 1 each 0   Blood Pressure Monitoring (BLOOD PRESSURE KIT) KIT 1 kit by Does not apply route daily as needed. 1 kit 0   clopidogrel (PLAVIX) 75 MG tablet Take 1 tablet (75 mg total) by mouth once daily. 90 tablet 3   dapagliflozin propanediol (FARXIGA) 10 MG TABS tablet Take 1 tablet (10 mg total) by mouth daily before breakfast. 30 tablet 5   diphenhydrAMINE (BENADRYL) 50 MG tablet Take 50 mg by mouth 2 (two) times daily.      EPINEPHrine 0.3 mg/0.3 mL IJ SOAJ injection Inject 0.3 into the muscle as needed for anaphylaxis 4 each 6   glucose blood (RIGHTEST GS550 BLOOD GLUCOSE) test strip Use as directed to test blood sugar. 100 each 11   Insulin Pen Needle 32G X 6 MM MISC Use one pen needle with Ozempic once every week 100 each 3   isosorbide mononitrate (IMDUR) 60 MG 24 hr tablet TAKE 1 TABLET BY MOUTH TWICE DAILY. 180 tablet 3   metoprolol tartrate (LOPRESSOR) 25 MG tablet TAKE ONE TABLET BY MOUTH 2 TIMES A DAY 180 tablet 3   nitroGLYCERIN (NITROSTAT) 0.4 MG SL tablet 1 TABLET UNDER TONGUE AS NEEDED FOR CHEST PAIN EVERY 5 MINUTES FOR MAX OF 3 DOSES IN 15 MINUTES; IF NO RELIEF AFTER 1ST DOSE CALL 911 25 tablet 0   pantoprazole (PROTONIX) 40 MG tablet Take 1 tablet (40 mg total) by mouth once daily. 90 tablet 1   potassium chloride SA (KLOR-CON M) 20 MEQ tablet Take 1 tablet (20 mEq total) by mouth 2 (two) times daily. 180 tablet 3   ranolazine (RANEXA) 500 MG 12 hr tablet Take 1 tablet (500 mg total) by mouth 2 (two) times daily. 180 tablet 3   Rightest GL300 Lancets MISC Use as directed to test blood sugar. 100 each 11   Semaglutide, 1 MG/DOSE, (OZEMPIC, 1 MG/DOSE,) 4 MG/3ML SOPN Inject 1 mg into the skin once a week. Instead of Trulicity 3 mL 6   simvastatin (ZOCOR) 20 MG tablet Take 1 tablet (20 mg total) by mouth once daily. 90 tablet 1   spironolactone (ALDACTONE) 25 MG tablet TAKE 1/2 TABLET BY MOUTH ONCE EVERY DAY. 45 tablet 3   torsemide (DEMADEX) 20 MG tablet Take 3 tablets (60 mg) by mouth in the morning and then take 1 tablet (20 mg) by mouth in the evening. 360 tablet 3   No current facility-administered medications for this visit.    Allergies:   Aspirin, Beef allergy, Celebrex [celecoxib], Fentanyl, Gabapentin, Hydrocodone-acetaminophen, Latex, Lisinopril, Morphine, Oxycodone-acetaminophen, Percocet [oxycodone-acetaminophen], Pork allergy, Simvastatin, Simvastatin-high dose, Skelaxin [metaxalone],  Beef-derived products, Fluticasone propionate, Pork-derived products, Vicodin [hydrocodone-acetaminophen], and Albuterol    Social History:  The patient  reports that she quit smoking about 4 years ago. Her smoking use included cigarettes. She has a 30.00 pack-year smoking history. She has never used smokeless tobacco. She reports that she does not currently use alcohol. She reports that she does not currently use drugs.   Family History:  The patient's family history includes Asthma in her daughter and son; COPD in her sister; Colon cancer in her maternal grandfather and mother; Diabetes in her mother and sister; Heart attack in her father and mother; Heart disease in her maternal  grandmother and paternal grandfather; Hyperlipidemia in her maternal grandfather and paternal grandfather; Hypertension in her daughter, maternal grandfather, maternal grandmother, and paternal grandfather; Kidney disease in her sister; Migraines in her sister; Stroke in her father, mother, paternal grandmother, and sister; Thyroid disease in her daughter, mother, sister, and sister.    ROS:  Please see the history of present illness.   Otherwise, review of systems are positive for none.   All other systems are reviewed and negative.    PHYSICAL EXAM: VS:  BP 110/70 (BP Location: Left Arm, Patient Position: Sitting, Cuff Size: Large)   Pulse 72   Ht 5\' 7"  (1.702 m)   Wt 275 lb 8 oz (125 kg)   LMP 06/09/2018   SpO2 98%   BMI 43.15 kg/m  , BMI Body mass index is 43.15 kg/m. GEN: Well nourished, well developed, in no acute distress  HEENT: normal  Neck: no JVD, carotid bruits, or masses Cardiac: RRR; no  rubs, or gallops .  1/ 6 systolic murmur in the aortic area.  Mild bilateral leg edema Respiratory:  clear to auscultation bilaterally, normal work of breathing GI: soft, nontender, nondistended, + BS MS: no deformity or atrophy  Skin: warm and dry, no rash Neuro:  Strength and sensation are intact Psych:  euthymic mood, full affect Right radial pulses normal.  EKG:  EKG is ordered today. The ekg ordered today demonstrates normal sinus rhythm with left bundle branch block.   Recent Labs: 01/18/2022: ALT 22; Hemoglobin 13.4; Platelets 304 05/31/2022: BUN 14; Creatinine, Ser 0.95; Potassium 3.6; Sodium 138    Lipid Panel    Component Value Date/Time   CHOL 159 01/18/2022 1216   TRIG 198 (H) 01/18/2022 1216   HDL 32 (L) 01/18/2022 1216   CHOLHDL 5.0 (H) 01/18/2022 1216   CHOLHDL 5.7 02/24/2016 0618   VLDL 30 02/24/2016 0618   LDLCALC 93 01/18/2022 1216      Wt Readings from Last 3 Encounters:  06/23/22 275 lb 8 oz (125 kg)  05/31/22 271 lb 8 oz (123.2 kg)  04/28/22 272 lb (123.4 kg)          02/23/2017    3:01 PM  PAD Screen  Previous PAD dx? No  Previous surgical procedure? No  Pain with walking? Yes  Subsides with rest? No  Feet/toe relief with dangling? No  Painful, non-healing ulcers? No  Extremities discolored? No      ASSESSMENT AND PLAN:  1.  Mild coronary artery disease with other forms of angina:  She has chronic angina thought to be due to coronary spasm or endothelial dysfunction.  She reports worsening shortness of breath without chest pain likely due to worsening heart failure..  Continue antianginal medications including Imdur and Ranexa.  2.  Chronic systolic heart failure: Recent echocardiogram showed a drop in her ejection fraction to 40 to 45% with significant worsening of exertional dyspnea and orthopnea.   Given the drop in ejection fraction and her symptoms, recommend proceeding with a right and left cardiac catheterization and possible PCI.  I discussed the procedure in details as well as risk and benefits.  She is allergic to fentanyl and latex.  In addition, she has a left bundle branch block and we have to be cautious during right heart catheterization.  3.  Hyperlipidemia: Continue treatment with simvastatin.  Most recent LDL was 93.  4.   Previous tobacco use: No relapse.  5.  Essential hypertension: Blood pressure is controlled.   Disposition:  FU with me in 6 months  Signed,  Kathy Sacramento, MD  06/23/2022 3:17 PM    [place]

## 2022-06-23 NOTE — Patient Instructions (Addendum)
Medication Instructions:  No changes *If you need a refill on your cardiac medications before your next appointment, please call your pharmacy*  Testing/Procedures: Your physician has requested that you have a cardiac catheterization. Cardiac catheterization is used to diagnose and/or treat various heart conditions. Doctors may recommend this procedure for a number of different reasons. The most common reason is to evaluate chest pain. Chest pain can be a symptom of coronary artery disease (CAD), and cardiac catheterization can show whether plaque is narrowing or blocking your heart's arteries. This procedure is also used to evaluate the valves, as well as measure the blood flow and oxygen levels in different parts of your heart. For further information please visit [email or web address]. Please follow instruction sheet, as given.    Follow-Up: At [facility], you and your health needs are our priority.  As part of our continuing mission to provide you with exceptional heart care, we have created designated Provider Care Teams.  These Care Teams include your primary Cardiologist (physician) and Advanced Practice Providers (APPs -  Physician Assistants and Nurse Practitioners) who all work together to provide you with the care you need, when you need it.  We recommend signing up for the patient portal called "MyChart".  Sign up information is provided on this After Visit Summary.  MyChart is used to connect with patients for Virtual Visits (Telemedicine).  Patients are able to view lab/test results, encounter notes, upcoming appointments, etc.  Non-urgent messages can be sent to your provider as well.   To learn more about what you can do with MyChart, go to [email or web address].    Your next appointment:   1 month(s)  Provider:   You may see [name], MD or one of the following Advanced Practice Providers on your designated Care Team:   Murray Hodgkins, NP Christell Faith,  PA-C Cadence Kathlen Mody, PA-C Gerrie Nordmann, NP    Other Instructions  [facility] [place], [place] [id number] Dept: [phone] Loc: [place]  06/23/2022  You are scheduled for a Cardiac Catheterization on Tuesday, March 26 with Dr. [name].  1. Please arrive at the [place], [place], [place] at 8:30 am (This is 1 hour prior to your procedure time).  Proceed to the Check-In Desk directly inside the entrance.  Procedure Parking: Use the entrance off of the [place] side of the hospital. Turn right upon entering and follow the driveway to parking that is directly in front of the [place]. There is no valet parking available at this entrance, however there is an awning directly in front of the [place] for drop off/ pick up for patients.  Special note: Every effort is made to have your procedure done on time. Please understand that emergencies sometimes delay scheduled procedures.  2. Diet: Do not eat solid foods after midnight.  The patient may have clear liquids until 5am upon the day of the procedure.  3. Labs: You will need to have blood drawn on 06/23/22  4. Medication instructions in preparation for your procedure:  Hold all diabetic medication the morning of the procedure Hold the torsemide and spironolactone the morning of the procedure  On the morning of your procedure, take your Plavix/Clopidogrel and any morning medicines NOT listed above.  You may use sips of water.  5. Plan for one night stay--bring personal belongings. 6. Bring a current list of your medications and current insurance cards. 7. You MUST have a responsible person to drive you home. 8. Someone MUST be with you the first 24 hours after you arrive home or your discharge will be  delayed. 9. Please wear clothes that are easy to get on and off and wear slip-on shoes.  Thank you for allowing [place] to care for you!   -- [place]

## 2022-06-23 NOTE — H&P (View-Only) (Signed)
Cardiology Office Note   Date:  06/23/2022   ID:  Kathy [name], DOB Apr 08, 1966, MRN [id number]  PCP:  Langston Reusing, NP  Cardiologist:   [name], MD   Chief Complaint  Patient presents with   Other    Possible cath c/o fluctuating BP, fluid retention and weight gain. Meds reviewed verbally with pt.      History of Present Illness: Kathy [name] is a 57 y.o. female who is here today for follow-up visit regarding chronic chest pain, left bundle branch block and chronic diastolic heart failure.    The patient has known history of recurrent chest pain usually responsive to nitroglycerin which is thought to be due to coronary spasm or endothelial dysfunction.    She had cardiac catheterization done 4 times in the past in 2007, 2011, 2012 and most recently in December 2017 . All these showed mild nonobstructive coronary artery disease.  She is known to have  left bundle branch block.  She is allergic to aspirin and thus on long-term Plavix.  Other medical problems include previous tobacco use, obesity, chronic back pain and hyperlipidemia.  She has family history of coronary artery disease.  She was hospitalized in March of 2022 with acute on chronic diastolic heart failure in the setting of excessive sodium and fluid intake.  She improved with IV furosemide.  Echocardiogram showed an EF of 50 to 55%.    She was seen recently and reported worsening exertional dyspnea.  She had an echocardiogram done last month which showed an EF of 40 to 45%, mild pulmonary hypertension and mild to moderate mitral regurgitation.  She reports significant worsening of exertional dyspnea and orthopnea.  She did not tolerate Jardiance due to itching and palpitations.  Past Medical History:  Diagnosis Date   (HFimpEF) heart failure with improved ejection fraction ([place])    a. 02/2016 Echo: EF 45%; b. 03/2016 Echo: EF 55%; c. 12/2019 Echo: EF 55-60%, no rwma, gr2 DD, nl RV size/fxn, mildly dil RA,  mild MR.   Asthma    Chronic chest pain    a. ? vasospasm vs endothelial dysfxn. Improved w/ Ranexa but couldn't afford.   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 Presumed in setting of prior h/o MI w/ insignificant coronary dzs.   Diabetes mellitus without complication (HCC)    Heart murmur    History of NICM (nonischemic cardiomyopathy) ([place])    a. 02/2016 Echo: EF 45%; b. 12/2019 Echo: EF 55-60%.   Hyperlipidemia    Hypertension    Myocardial infarction [facility])    Non-obstructive Coronary artery disease    a. Prior caths in 2007, 2011, 2012 - nonobs dzs; b. 03/2016 Cath ([facility]): LM nl, LAD/LCX/RCA insignificant dzs.   Obesity    Rate-dependent LBBB (left bundle branch block)    Remote Tobacco abuse    a. 30+ pack year history - quit 2019.   Seizures ([place])     Past Surgical History:  Procedure Laterality Date   ANTERIOR CRUCIATE LIGAMENT REPAIR     BACK SURGERY     L4 and L5   CARDIAC CATHETERIZATION  04/09/2016   COLONOSCOPY     COLONOSCOPY WITH PROPOFOL N/A 08/04/2019   Procedure: COLONOSCOPY WITH PROPOFOL;  Surgeon: Lin Landsman, MD;  Location: [facility] ENDOSCOPY;  Service: Gastroenterology;  Laterality: N/A;   EMPYEMA DRAINAGE     at age 11   TONSILLECTOMY     TONSILLECTOMY AND ADENOIDECTOMY  2008   TUBAL  LIGATION       Current Outpatient Medications  Medication Sig Dispense Refill   blood glucose meter kit and supplies KIT Dispense based on patient and insurance preference. Use up to four times daily as directed. (FOR ICD-9 250.00, 250.01). 1 each 0   Blood Pressure Monitoring (BLOOD PRESSURE KIT) KIT 1 kit by Does not apply route daily as needed. 1 kit 0   clopidogrel (PLAVIX) 75 MG tablet Take 1 tablet (75 mg total) by mouth once daily. 90 tablet 3   dapagliflozin propanediol (FARXIGA) 10 MG TABS tablet Take 1 tablet (10 mg total) by mouth daily before breakfast. 30 tablet 5   diphenhydrAMINE (BENADRYL) 50 MG tablet Take 50 mg by mouth 2 (two) times daily.      EPINEPHrine 0.3 mg/0.3 mL IJ SOAJ injection Inject 0.3 into the muscle as needed for anaphylaxis 4 each 6   glucose blood (RIGHTEST GS550 BLOOD GLUCOSE) test strip Use as directed to test blood sugar. 100 each 11   Insulin Pen Needle 32G X 6 MM MISC Use one pen needle with Ozempic once every week 100 each 3   isosorbide mononitrate (IMDUR) 60 MG 24 hr tablet TAKE 1 TABLET BY MOUTH TWICE DAILY. 180 tablet 3   metoprolol tartrate (LOPRESSOR) 25 MG tablet TAKE ONE TABLET BY MOUTH 2 TIMES A DAY 180 tablet 3   nitroGLYCERIN (NITROSTAT) 0.4 MG SL tablet 1 TABLET UNDER TONGUE AS NEEDED FOR CHEST PAIN EVERY 5 MINUTES FOR MAX OF 3 DOSES IN 15 MINUTES; IF NO RELIEF AFTER 1ST DOSE CALL 911 25 tablet 0   pantoprazole (PROTONIX) 40 MG tablet Take 1 tablet (40 mg total) by mouth once daily. 90 tablet 1   potassium chloride SA (KLOR-CON M) 20 MEQ tablet Take 1 tablet (20 mEq total) by mouth 2 (two) times daily. 180 tablet 3   ranolazine (RANEXA) 500 MG 12 hr tablet Take 1 tablet (500 mg total) by mouth 2 (two) times daily. 180 tablet 3   Rightest GL300 Lancets MISC Use as directed to test blood sugar. 100 each 11   Semaglutide, 1 MG/DOSE, (OZEMPIC, 1 MG/DOSE,) 4 MG/3ML SOPN Inject 1 mg into the skin once a week. Instead of Trulicity 3 mL 6   simvastatin (ZOCOR) 20 MG tablet Take 1 tablet (20 mg total) by mouth once daily. 90 tablet 1   spironolactone (ALDACTONE) 25 MG tablet TAKE 1/2 TABLET BY MOUTH ONCE EVERY DAY. 45 tablet 3   torsemide (DEMADEX) 20 MG tablet Take 3 tablets (60 mg) by mouth in the morning and then take 1 tablet (20 mg) by mouth in the evening. 360 tablet 3   No current facility-administered medications for this visit.    Allergies:   Aspirin, Beef allergy, Celebrex [celecoxib], Fentanyl, Gabapentin, Hydrocodone-acetaminophen, Latex, Lisinopril, Morphine, Oxycodone-acetaminophen, Percocet [oxycodone-acetaminophen], Pork allergy, Simvastatin, Simvastatin-high dose, Skelaxin [metaxalone],  Beef-derived products, Fluticasone propionate, Pork-derived products, Vicodin [hydrocodone-acetaminophen], and Albuterol    Social History:  The patient  reports that she quit smoking about 4 years ago. Her smoking use included cigarettes. She has a 30.00 pack-year smoking history. She has never used smokeless tobacco. She reports that she does not currently use alcohol. She reports that she does not currently use drugs.   Family History:  The patient's family history includes Asthma in her daughter and son; COPD in her sister; Colon cancer in her maternal grandfather and mother; Diabetes in her mother and sister; Heart attack in her father and mother; Heart disease in her maternal  grandmother and paternal grandfather; Hyperlipidemia in her maternal grandfather and paternal grandfather; Hypertension in her daughter, maternal grandfather, maternal grandmother, and paternal grandfather; Kidney disease in her sister; Migraines in her sister; Stroke in her father, mother, paternal grandmother, and sister; Thyroid disease in her daughter, mother, sister, and sister.    ROS:  Please see the history of present illness.   Otherwise, review of systems are positive for none.   All other systems are reviewed and negative.    PHYSICAL EXAM: VS:  BP 110/70 (BP Location: Left Arm, Patient Position: Sitting, Cuff Size: Large)   Pulse 72   Ht 5\' 7"  (1.702 m)   Wt 275 lb 8 oz (125 kg)   LMP 06/09/2018   SpO2 98%   BMI 43.15 kg/m  , BMI Body mass index is 43.15 kg/m. GEN: Well nourished, well developed, in no acute distress  HEENT: normal  Neck: no JVD, carotid bruits, or masses Cardiac: RRR; no  rubs, or gallops .  1/ 6 systolic murmur in the aortic area.  Mild bilateral leg edema Respiratory:  clear to auscultation bilaterally, normal work of breathing GI: soft, nontender, nondistended, + BS MS: no deformity or atrophy  Skin: warm and dry, no rash Neuro:  Strength and sensation are intact Psych:  euthymic mood, full affect Right radial pulses normal.  EKG:  EKG is ordered today. The ekg ordered today demonstrates normal sinus rhythm with left bundle branch block.   Recent Labs: 01/18/2022: ALT 22; Hemoglobin 13.4; Platelets 304 05/31/2022: BUN 14; Creatinine, Ser 0.95; Potassium 3.6; Sodium 138    Lipid Panel    Component Value Date/Time   CHOL 159 01/18/2022 1216   TRIG 198 (H) 01/18/2022 1216   HDL 32 (L) 01/18/2022 1216   CHOLHDL 5.0 (H) 01/18/2022 1216   CHOLHDL 5.7 02/24/2016 0618   VLDL 30 02/24/2016 0618   LDLCALC 93 01/18/2022 1216      Wt Readings from Last 3 Encounters:  06/23/22 275 lb 8 oz (125 kg)  05/31/22 271 lb 8 oz (123.2 kg)  04/28/22 272 lb (123.4 kg)          02/23/2017    3:01 PM  PAD Screen  Previous PAD dx? No  Previous surgical procedure? No  Pain with walking? Yes  Subsides with rest? No  Feet/toe relief with dangling? No  Painful, non-healing ulcers? No  Extremities discolored? No      ASSESSMENT AND PLAN:  1.  Mild coronary artery disease with other forms of angina:  She has chronic angina thought to be due to coronary spasm or endothelial dysfunction.  She reports worsening shortness of breath without chest pain likely due to worsening heart failure..  Continue antianginal medications including Imdur and Ranexa.  2.  Chronic systolic heart failure: Recent echocardiogram showed a drop in her ejection fraction to 40 to 45% with significant worsening of exertional dyspnea and orthopnea.   Given the drop in ejection fraction and her symptoms, recommend proceeding with a right and left cardiac catheterization and possible PCI.  I discussed the procedure in details as well as risk and benefits.  She is allergic to fentanyl and latex.  In addition, she has a left bundle branch block and we have to be cautious during right heart catheterization.  3.  Hyperlipidemia: Continue treatment with simvastatin.  Most recent LDL was 93.  4.   Previous tobacco use: No relapse.  5.  Essential hypertension: Blood pressure is controlled.   Disposition:  FU with me in 6 months  Signed,  [name], MD  06/23/2022 3:17 PM    [place]

## 2022-06-28 ENCOUNTER — Other Ambulatory Visit: Payer: Self-pay

## 2022-06-29 ENCOUNTER — Ambulatory Visit: Payer: Self-pay | Attending: Family | Admitting: Family

## 2022-06-29 ENCOUNTER — Other Ambulatory Visit: Payer: Self-pay

## 2022-06-29 ENCOUNTER — Encounter: Payer: Self-pay | Admitting: Family

## 2022-06-29 VITALS — BP 117/74 | HR 65 | Resp 14 | Wt 279.0 lb

## 2022-06-29 DIAGNOSIS — I5032 Chronic diastolic (congestive) heart failure: Secondary | ICD-10-CM

## 2022-06-29 DIAGNOSIS — R110 Nausea: Secondary | ICD-10-CM | POA: Insufficient documentation

## 2022-06-29 DIAGNOSIS — I10 Essential (primary) hypertension: Secondary | ICD-10-CM

## 2022-06-29 DIAGNOSIS — Z79899 Other long term (current) drug therapy: Secondary | ICD-10-CM | POA: Insufficient documentation

## 2022-06-29 DIAGNOSIS — Z833 Family history of diabetes mellitus: Secondary | ICD-10-CM | POA: Insufficient documentation

## 2022-06-29 DIAGNOSIS — E119 Type 2 diabetes mellitus without complications: Secondary | ICD-10-CM

## 2022-06-29 DIAGNOSIS — I110 Hypertensive heart disease with heart failure: Secondary | ICD-10-CM | POA: Insufficient documentation

## 2022-06-29 NOTE — Progress Notes (Signed)
Patient ID: Kathy [name], female    DOB: 12-11-1965, 57 y.o.   MRN: [id number]  [name] is a 57 y/o female with a history of asthma, CAD, hyperlipidemia, HTN, seizures, DM, previous tobacco use and chronic heart failure.   Echo 06/08/22: EF 40-45% along with mild/moderate MR. Echo 06/14/20: EF of 50-55% along with mild LVH. Echo 01/06/20: EF of 55-60% along with mild LVH and mild MR. Echo 02/27/17: EF of 55-60%.  Has not been admitted or been in the ED in the last 6 months.   She presents today for a HF follow up visit with a chief complaint of minimal fatigue upon moderate exertion. Chronic in nature. Has associated chest tightness, chronic pain, gradual weight gain and chronic difficulty sleeping along with this. Denies any abdominal distention, palpitations, pedal edema, chest pain, SOB, cough or dizziness.   She's unsure if she has abdominal distention but does feel a little bloated.   Has cath scheduled for next week.   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 02/2016 Echo: EF 45%; b. 03/2016 Echo: EF 55%; c. 12/2019 Echo: EF 55-60%, no rwma, gr2 DD, nl RV size/fxn, mildly dil RA, mild MR.   Asthma    Chronic chest pain    a. ? vasospasm vs endothelial dysfxn. Improved w/ Ranexa but couldn't afford.   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 Presumed in setting of prior h/o MI w/ insignificant coronary dzs.   Diabetes mellitus without complication (HCC)    Heart murmur    History of NICM (nonischemic cardiomyopathy) ([place])    a. 02/2016 Echo: EF 45%; b. 12/2019 Echo: EF 55-60%.   Hyperlipidemia    Hypertension    Myocardial infarction [facility])    Non-obstructive Coronary artery disease    a. Prior caths in 2007, 2011, 2012 - nonobs dzs; b. 03/2016 Cath ([facility]): LM nl, LAD/LCX/RCA insignificant dzs.   Obesity    Rate-dependent LBBB (left bundle branch block)    Remote Tobacco abuse    a. 30+ pack year history - quit 2019.    Seizures ([place])    Past Surgical History:  Procedure Laterality Date   ANTERIOR CRUCIATE LIGAMENT REPAIR     BACK SURGERY     L4 and L5   CARDIAC CATHETERIZATION  04/09/2016   COLONOSCOPY     COLONOSCOPY WITH PROPOFOL N/A 08/04/2019   Procedure: COLONOSCOPY WITH PROPOFOL;  Surgeon: Lin Landsman, MD;  Location: [facility] ENDOSCOPY;  Service: Gastroenterology;  Laterality: N/A;   EMPYEMA DRAINAGE     at age 47   TONSILLECTOMY     TONSILLECTOMY AND ADENOIDECTOMY  2008   TUBAL LIGATION     Family History  Problem Relation Age of Onset   Kidney disease Sister    Diabetes Sister    COPD Sister    Stroke Sister    Thyroid disease Sister    Diabetes Mother    Stroke Mother    Heart attack Mother    Thyroid disease Mother    Colon cancer Mother    Stroke Father    Heart attack Father    Asthma Daughter    Hypertension Daughter    Thyroid disease Daughter    Asthma Son    Heart disease Maternal Grandmother    Hypertension Maternal Grandmother    Colon cancer Maternal Grandfather    Hypertension Maternal Grandfather    Hyperlipidemia Maternal Grandfather  Stroke Paternal Grandmother    Hypertension Paternal Grandfather    Hyperlipidemia Paternal Grandfather    Heart disease Paternal Grandfather    Thyroid disease Sister    Migraines Sister    Social History   Tobacco Use   Smoking status: Former    Packs/day: 1.00    Years: 30.00    Additional pack years: 0.00    Total pack years: 30.00    Types: Cigarettes    Quit date: 08/16/2017    Years since quitting: 4.8   Smokeless tobacco: Never  Substance Use Topics   Alcohol use: Not Currently    Comment: last use 01/2022   Allergies  Allergen Reactions   Aspirin Anaphylaxis, Hives and Swelling    Occurred as a child Occurred as a child   Beef Allergy Hives   Celebrex [Celecoxib] Anaphylaxis and Hives   Fentanyl Anaphylaxis   Gabapentin Shortness Of Breath   Hydrocodone-Acetaminophen Hives, Other (See  Comments) and Swelling    Feels like her tongue swells. Trouble swallowing   Latex Hives   Lisinopril Shortness Of Breath   Morphine Anaphylaxis and Swelling   Oxycodone-Acetaminophen Hives and Shortness Of Breath   Percocet [Oxycodone-Acetaminophen] Hives and Shortness Of Breath   Pork Allergy Hives   Simvastatin Hives and Shortness Of Breath    Can tolerate 20 mg dose.   Simvastatin-High Dose Hives and Shortness Of Breath    Can tolerate 20 mg dose.    Skelaxin [Metaxalone] Anaphylaxis   Beef-Derived Products Hives   Fluticasone Propionate Swelling and Other (See Comments)    Swelling, pain, and itching   Pork-Derived Products Hives   Vicodin [Hydrocodone-Acetaminophen] Hives and Other (See Comments)    Feels like her tongue swells. Trouble swallowing   Albuterol Palpitations   Prior to Admission medications   Medication Sig Start Date End Date Taking? Authorizing Provider  blood glucose meter kit and supplies KIT Dispense based on patient and insurance preference. Use up to four times daily as directed. (FOR ICD-9 250.00, 250.01). 03/02/20  Yes Iloabachie, Chioma E, NP  Blood Pressure Monitoring (BLOOD PRESSURE KIT) KIT 1 kit by Does not apply route daily as needed. 01/26/22  Yes Iloabachie, Chioma E, NP  clopidogrel (PLAVIX) 75 MG tablet Take 1 tablet (75 mg total) by mouth once daily. 04/28/22  Yes Wellington Hampshire, MD  diphenhydrAMINE (BENADRYL) 50 MG tablet Take 50 mg by mouth 2 (two) times daily.   Yes [provider]  EPINEPHrine 0.3 mg/0.3 mL IJ SOAJ injection Inject 0.3 into the muscle as needed for anaphylaxis 08/16/21  Yes Iloabachie, Chioma E, NP  glucose blood (RIGHTEST GS550 BLOOD GLUCOSE) test strip Use as directed to test blood sugar. 05/16/21  Yes Cammy Copa, RPH  Insulin Pen Needle 32G X 6 MM MISC Use one pen needle with Ozempic once every week 11/03/21  Yes Iloabachie, Chioma E, NP  isosorbide mononitrate (IMDUR) 60 MG 24 hr tablet TAKE 1 TABLET BY MOUTH  TWICE DAILY. 02/22/22  Yes Furth, Cadence H, PA-C  metoprolol tartrate (LOPRESSOR) 25 MG tablet Take 1 tablet (25 mg total) by mouth 2 (two) times daily. 04/28/22 04/28/23 Yes Wellington Hampshire, MD  nitroGLYCERIN (NITROSTAT) 0.4 MG SL tablet 1 TABLET UNDER TONGUE AS NEEDED FOR CHEST PAIN EVERY 5 MINUTES FOR MAX OF 3 DOSES IN 15 MINUTES; IF NO RELIEF AFTER 1ST DOSE CALL 911 05/13/19  Yes McGowan, Shannon A, PA-C  pantoprazole (PROTONIX) 40 MG tablet Take 1 tablet (40 mg total) by  mouth once daily. 01/26/22  Yes Iloabachie, Chioma E, NP  potassium chloride SA (KLOR-CON M) 20 MEQ tablet Take 1 tablet (20 mEq total) by mouth 2 (two) times daily. 09/07/21  Yes Jacoba Cherney, Otila Kluver A, FNP  ranolazine (RANEXA) 500 MG 12 hr tablet Take 1 tablet (500 mg total) by mouth 2 (two) times daily. 04/28/22  Yes Wellington Hampshire, MD  Rightest GL300 Lancets MISC Use as directed to test blood sugar. 11/08/21  Yes Iloabachie, Chioma E, NP  Semaglutide, 1 MG/DOSE, (OZEMPIC, 1 MG/DOSE,) 4 MG/3ML SOPN Inject 1 mg into the skin once a week. Instead of Trulicity 123XX123  Yes Iloabachie, Chioma E, NP  simvastatin (ZOCOR) 20 MG tablet Take 1 tablet (20 mg total) by mouth once daily. 01/26/22  Yes Iloabachie, Chioma E, NP  spironolactone (ALDACTONE) 25 MG tablet TAKE 1/2 TABLET BY MOUTH ONCE EVERY DAY. 11/29/21 08/27/22 Yes Alexandro Line, Aura Fey, FNP  torsemide (DEMADEX) 20 MG tablet Take 3 tablets (60 mg) by mouth in the morning and then take 1 tablet (20 mg) by mouth in the evening. 10/24/21  Yes Alisa Graff, FNP    Review of Systems  Constitutional:  Positive for fatigue ("very little"). Negative for appetite change.  HENT:  Negative for congestion, postnasal drip and sore throat.   Eyes: Negative.   Respiratory:  Positive for chest tightness (at times in left upper chest). Negative for apnea, cough and shortness of breath.   Cardiovascular:  Negative for chest pain, palpitations and leg swelling.  Gastrointestinal:  Negative for abdominal  distention and abdominal pain.  Endocrine: Negative.   Genitourinary: Negative.   Musculoskeletal:  Positive for arthralgias (both knees) and back pain. Negative for neck pain.  Skin: Negative.   Allergic/Immunologic: Positive for food allergies (Had allergic reaction to pork rinds).  Neurological:  Negative for dizziness and light-headedness.  Hematological:  Negative for adenopathy. Does not bruise/bleed easily.  Psychiatric/Behavioral:  Positive for sleep disturbance (sleeping on 1 pillow). Negative for dysphoric mood. The patient is not nervous/anxious.    Vitals:   06/29/22 1355  BP: 117/74  Pulse: 65  Resp: 14  SpO2: 98%  Weight: 279 lb (126.6 kg)   Wt Readings from Last 3 Encounters:  06/29/22 279 lb (126.6 kg)  06/23/22 275 lb 8 oz (125 kg)  05/31/22 271 lb 8 oz (123.2 kg)   Lab Results  Component Value Date   CREATININE 0.87 06/23/2022   CREATININE 0.95 05/31/2022   CREATININE 0.82 01/18/2022   Physical Exam Vitals and nursing note reviewed.  Constitutional:      General: She is not in acute distress.    Appearance: Normal appearance. She is well-developed. She is not ill-appearing, toxic-appearing or diaphoretic.  HENT:     Head: Normocephalic and atraumatic.  Neck:     Vascular: No JVD.  Cardiovascular:     Rate and Rhythm: Normal rate and regular rhythm.     Heart sounds: Normal heart sounds.  Pulmonary:     Effort: Pulmonary effort is normal. No respiratory distress.     Breath sounds: No wheezing, rhonchi or rales.  Abdominal:     Palpations: Abdomen is soft.     Tenderness: There is no abdominal tenderness.  Musculoskeletal:        General: No tenderness.     Cervical back: Neck supple.     Right lower leg: No tenderness. No edema.     Left lower leg: No tenderness. No edema.  Skin:    General:  Skin is warm and dry.  Neurological:     General: No focal deficit present.     Mental Status: She is alert and oriented to person, place, and time.   Psychiatric:        Mood and Affect: Mood normal.        Behavior: Behavior normal.     Assessment & Plan:  1: Chronic heart failure with preserved ejection fraction with LVH- - NYHA class II - euvolemic today  - weighing daily; reminded to call for an overnight weight gain of >2 pounds or a weekly weight gain of >5 pounds - weight up 8 pounds from last visit here 1 month ago - echo 06/08/22: EF 40-45% along with mild/moderate MR. Echo 06/14/20: EF of 50-55% along with mild LVH. Echo 01/06/20: EF of 55-60% along with mild LVH and mild MR. Echo 02/27/17: EF of 55-60%. - has been following a daily 2L fluid intake along with closely following a 2000mg  sodium diet - has cath scheduled on 07/04/22 - saw cardiology Fletcher Anon) 06/23/22 - isosorbide MN 60mg  BID - metoprolol tartrate 25mg  BID - spironolactone 25mg  daily - torsemide 60mg  QAM/ 20mg  PM; increase this to 60mg  AM/ 40mg  PM X 3 days - potassium 16meq BID; increase this to 3 tablets daily X 3 day - jardiance caused HA, nausea and increased thirst so this was stopped - BNP 06/12/20 was 45.8  2: HTN- - BP 117/74 - saw PCP (llobachie NP) 04/26/22 at [facility] - BMP 06/23/22 reviewed and showed sodium 137, potassium 3.8, creatinine 0.87 and GFR >60  3: DM- - A1c 04/26/22 was 5.5% - ozempic 1mg  weekly   Return in 6 weeks, sooner if needed.

## 2022-06-29 NOTE — Patient Instructions (Signed)
Increase your torsemide to 3 tablets in the morning and 2 tablets in the evening for the next 3 days.    For these 3 days, take an additional potassium tablet on each of the next 3 days.    After these 3 days, resume your normal torsemide/ potassium doses.

## 2022-07-04 ENCOUNTER — Encounter
Admission: RE | Disposition: A | Discharge status: Home / Self Care | Payer: Self-pay | Source: Ambulatory Visit | Attending: Cardiovascular Disease

## 2022-07-04 ENCOUNTER — Other Ambulatory Visit: Payer: Self-pay

## 2022-07-04 ENCOUNTER — Ambulatory Visit
Admission: RE | Admit: 2022-07-04 | Discharge: 2022-07-04 | Disposition: A | Discharge status: Home / Self Care | Payer: Self-pay | Source: Ambulatory Visit | Attending: Cardiovascular Disease | Admitting: Cardiovascular Disease

## 2022-07-04 ENCOUNTER — Encounter: Payer: Self-pay | Admitting: Cardiovascular Disease

## 2022-07-04 DIAGNOSIS — I25118 Atherosclerotic heart disease of native coronary artery with other forms of angina pectoris: Secondary | ICD-10-CM | POA: Insufficient documentation

## 2022-07-04 DIAGNOSIS — Z91040 Latex allergy status: Secondary | ICD-10-CM | POA: Insufficient documentation

## 2022-07-04 DIAGNOSIS — Z87891 Personal history of nicotine dependence: Secondary | ICD-10-CM | POA: Insufficient documentation

## 2022-07-04 DIAGNOSIS — E785 Hyperlipidemia, unspecified: Secondary | ICD-10-CM | POA: Insufficient documentation

## 2022-07-04 DIAGNOSIS — Z79899 Other long term (current) drug therapy: Secondary | ICD-10-CM | POA: Insufficient documentation

## 2022-07-04 DIAGNOSIS — I110 Hypertensive heart disease with heart failure: Secondary | ICD-10-CM | POA: Insufficient documentation

## 2022-07-04 DIAGNOSIS — I447 Left bundle-branch block, unspecified: Secondary | ICD-10-CM | POA: Insufficient documentation

## 2022-07-04 DIAGNOSIS — Z888 Allergy status to other drugs, medicaments and biological substances status: Secondary | ICD-10-CM | POA: Insufficient documentation

## 2022-07-04 DIAGNOSIS — I5022 Chronic systolic (congestive) heart failure: Secondary | ICD-10-CM | POA: Insufficient documentation

## 2022-07-04 DIAGNOSIS — Z7902 Long term (current) use of antithrombotics/antiplatelets: Secondary | ICD-10-CM | POA: Insufficient documentation

## 2022-07-04 HISTORY — PX: RIGHT/LEFT HEART CATH AND CORONARY ANGIOGRAPHY: CATH118266

## 2022-07-04 LAB — POCT I-STAT 7, (LYTES, BLD GAS, ICA,H+H)
Acid-Base Excess: 1 mmol/L (ref 0.0–2.0)
Bicarbonate: 25.4 mmol/L (ref 20.0–28.0)
Calcium, Ion: 1.24 mmol/L (ref 1.15–1.40)
HCT: 40 % (ref 36.0–46.0)
Hemoglobin: 13.6 g/dL (ref 12.0–15.0)
O2 Saturation: 97 %
Potassium: 3.6 mmol/L (ref 3.5–5.1)
Sodium: 138 mmol/L (ref 135–145)
TCO2: 27 mmol/L (ref 22–32)
pCO2 arterial: 40.4 mmHg (ref 32–48)
pH, Arterial: 7.406 (ref 7.35–7.45)
pO2, Arterial: 90 mmHg (ref 83–108)

## 2022-07-04 LAB — GLUCOSE, CAPILLARY: Glucose-Capillary: 114 mg/dL — ABNORMAL HIGH (ref 70–99)

## 2022-07-04 SURGERY — RIGHT/LEFT HEART CATH AND CORONARY ANGIOGRAPHY
Anesthesia: Moderate Sedation | Laterality: Bilateral

## 2022-07-04 MED ORDER — SODIUM CHLORIDE 0.9% FLUSH: 3.0000 mL | INTRAVENOUS | Status: DC | PRN

## 2022-07-04 MED ORDER — MIDAZOLAM HCL 2 MG/2ML IJ SOLN
INTRAMUSCULAR | Status: AC
  Filled 2022-07-04: qty 2

## 2022-07-04 MED ORDER — SODIUM CHLORIDE 0.9 % IV SOLN: 250.0000 mL | INTRAVENOUS | Status: DC | PRN

## 2022-07-04 MED ORDER — VERAPAMIL HCL 2.5 MG/ML IV SOLN
INTRAVENOUS | Status: DC | PRN
  Administered 2022-07-04: 2.5 mg via INTRAVENOUS

## 2022-07-04 MED ORDER — SODIUM CHLORIDE 0.9 % IV SOLN: INTRAVENOUS | Status: DC

## 2022-07-04 MED ORDER — HEPARIN (PORCINE) IN NACL 1000-0.9 UT/500ML-% IV SOLN
INTRAVENOUS | Status: AC
  Filled 2022-07-04: qty 1000

## 2022-07-04 MED ORDER — HEPARIN SODIUM (PORCINE) 1000 UNIT/ML IJ SOLN
INTRAMUSCULAR | Status: DC | PRN
  Administered 2022-07-04: 5000 [IU] via INTRAVENOUS

## 2022-07-04 MED ORDER — HEPARIN (PORCINE) IN NACL 2000-0.9 UNIT/L-% IV SOLN
INTRAVENOUS | Status: DC | PRN
  Administered 2022-07-04: 1000 mL

## 2022-07-04 MED ORDER — MIDAZOLAM HCL 2 MG/2ML IJ SOLN
INTRAMUSCULAR | Status: DC | PRN
  Administered 2022-07-04 (×2): 1 mg via INTRAVENOUS

## 2022-07-04 MED ORDER — ASPIRIN 81 MG PO CHEW: 81.0000 mg | CHEWABLE_TABLET | ORAL | Status: DC

## 2022-07-04 MED ORDER — HEPARIN SODIUM (PORCINE) 1000 UNIT/ML IJ SOLN
INTRAMUSCULAR | Status: AC
  Filled 2022-07-04: qty 10

## 2022-07-04 MED ORDER — IOHEXOL 300 MG/ML  SOLN
INTRAMUSCULAR | Status: DC | PRN
  Administered 2022-07-04: 48 mL

## 2022-07-04 MED ORDER — TORSEMIDE 20 MG PO TABS: 40.0000 mg | ORAL_TABLET | Freq: Every day | ORAL | 3 refills | Status: DC

## 2022-07-04 MED ORDER — SODIUM CHLORIDE 0.9% FLUSH: 3.0000 mL | Freq: Two times a day (BID) | INTRAVENOUS | Status: DC

## 2022-07-04 MED ORDER — VERAPAMIL HCL 2.5 MG/ML IV SOLN
INTRAVENOUS | Status: AC
  Filled 2022-07-04: qty 2

## 2022-07-04 SURGICAL SUPPLY — 14 items
CATH 5F 110X4 TIG (CATHETERS) IMPLANT
CATH BALLN WEDGE 5F 110CM (CATHETERS) IMPLANT
DEVICE RAD TR BAND REGULAR (VASCULAR PRODUCTS) IMPLANT
DRAPE BRACHIAL (DRAPES) IMPLANT
GLIDESHEATH SLEND SS 6F .021 (SHEATH) IMPLANT
GUIDEWIRE INQWIRE 1.5J.035X260 (WIRE) IMPLANT
INQWIRE 1.5J .035X260CM (WIRE) ×1
PACK CARDIAC CATH (CUSTOM PROCEDURE TRAY) ×1 IMPLANT
PAD ELECT DEFIB RADIOL ZOLL (MISCELLANEOUS) IMPLANT
PANNUS RETENTION SYSTEM 2 PAD (MISCELLANEOUS) IMPLANT
PROTECTION STATION PRESSURIZED (MISCELLANEOUS) ×1
SET ATX-X65L (MISCELLANEOUS) IMPLANT
SHEATH GLIDE SLENDER 4/5FR (SHEATH) IMPLANT
STATION PROTECTION PRESSURIZED (MISCELLANEOUS) IMPLANT

## 2022-07-04 NOTE — Discharge Instructions (Signed)
Keep right wrist elevated on pillow above the heart for today.  Watch right wrist for evidence of bleeding or hematoma.. If bleeding or hematoma noted, hold pressure over the site for at least 15 minutes and notify the physician.  No blending or flexing of the wrist--no lifting for the remainder of the day or for 2 weeks after your procedure. Notify the physician for evidence of infection or if you get a temperature.   Radial Site Care Refer to this sheet in the next few weeks. These instructions provide you with information about caring for yourself after your procedure. Your health care provider may also give you more specific instructions. Your treatment has been planned according to current medical practices, but problems sometimes occur. Call your health care provider if you have any problems or questions after your procedure. What can I expect after the procedure? After your procedure, it is typical to have the following: Bruising at the radial site that usually fades within 1-2 weeks. Blood collecting in the tissue (hematoma) that may be painful to the touch. It should usually decrease in size and tenderness within 1-2 weeks.  Follow these instructions at home: Take medicines only as directed by your health care provider. If you are on a medication called Metformin please do not take for 48 hours after your procedure. Over the next 48hrs please increase your fluid intake of water and non caffeine beverages to flush the contrast dye out of your system.  You may shower 24 hours after the procedure  Leave your bandage on and gently wash the site with plain soap and water. Pat the area dry with a clean towel. Do not rub the site, because this may cause bleeding.  Remove your dressing 48hrs after your procedure and leave open to air.  Do not submerge your site in water for 7 days. This includes swimming and washing dishes.  Check your insertion site every day for redness, swelling, or drainage. Do  not apply powder or lotion to the site. Do not flex or bend the affected arm for 24 hours or as directed by your health care provider. Do not push or pull heavy objects with the affected arm for 24 hours or as directed by your health care provider. Do not lift over 10 lb (4.5 kg) for 5 days after your procedure or as directed by your health care provider. Ask your health care provider when it is okay to: Return to work or school. Resume usual physical activities or sports. Resume sexual activity. Do not drive home if you are discharged the same day as the procedure. Have someone else drive you. You may drive 48 hours after the procedure Do not operate machinery or power tools for 24 hours after the procedure. If your procedure was done as an outpatient procedure, which means that you went home the same day as your procedure, a responsible adult should be with you for the first 24 hours after you arrive home. Keep all follow-up visits as directed by your health care provider. This is important. Contact a health care provider if: You have a fever. You have chills. You have increased bleeding from the radial site. Hold pressure on the site. Get help right away if: You have unusual pain at the radial site. You have redness, warmth, or swelling at the radial site. You have drainage (other than a small amount of blood on the dressing) from the radial site. The radial site is bleeding, and the bleeding does not  stop after 15 minutes of holding steady pressure on the site. Your arm or hand becomes pale, cool, tingly, or numb. This information is not intended to replace advice given to you by your health care provider. Make sure you discuss any questions you have with your health care provider. Document Released: 04/29/2010 Document Revised: 09/02/2015 Document Reviewed: 10/13/2013 Elsevier Interactive Patient Education  2018 [place] After This sheet gives you  information about how to care for yourself after your procedure. Your health care provider may also give you more specific instructions. If you have problems or questions, contact your health care provider. What can I expect after the procedure? After the procedure, it is common to have: Bruising or mild discomfort in the area where the IV was inserted (insertion site). Follow these instructions at home: Eating and drinking  You may eat and drink after your procedure.  Drink a lot of fluids for the first several days after the procedure, as directed by your health care provider. This helps to wash (flush) the contrast out of your body. Examples of healthy fluids include water or low-calorie drinks. General instructions Check your IV insertion area and also your venous access site every day for signs of infection. Check for: Redness, swelling, or pain. Fluid or blood. Warmth. Pus or a bad smell. Take over-the-counter and prescription medicines only as told by your health care provider. Rest and return to your normal activities as told by your health care provider. Ask your health care provider what activities are safe for you. Do not drive for 24 hours if you were given a medicine to help you relax (sedative), or until your health care provider approves. Keep all follow-up visits as told by your health care provider. This is important. Contact a health care provider if: Your skin becomes itchy or you develop a rash or hives. You have a fever that does not get better with medicine. You feel nauseous. You vomit. You have redness, swelling, or pain around the insertion site. You have fluid or blood coming from the insertion site. Your insertion area feels warm to the touch. You have pus or a bad smell coming from the insertion site. Get help right away if: You have difficulty breathing or shortness of breath. You develop chest pain. You faint. You feel very dizzy. These symptoms may  represent a serious problem that is an emergency. Do not wait to see if the symptoms will go away. Get medical help right away. Call your local emergency services (911 in the [place] Do not drive yourself to the hospital. Summary After your procedure, it is common to have bruising or mild discomfort in the area where the IV was inserted. You should check your IV insertion area every day for signs of infection. Take over-the-counter and prescription medicines only as told by your health care provider. You should drink a lot of fluids for the first several days after the procedure to help flush the contrast from your body. This information is not intended to replace advice given to you by your health care provider. Make sure you discuss any questions you have with your health care provider. Document Released: 01/15/2013 Document Revised: 03/09/2017 Document Reviewed: 02/19/2016 Elsevier Patient Education  2020 [employer].

## 2022-07-04 NOTE — Interval H&P Note (Signed)
History and Physical Interval Note:  07/04/2022 10:56 AM  Kathy [name]  has presented today for surgery, with the diagnosis of R and L Cath   Chronic systolic HF.  The various methods of treatment have been discussed with the patient and family. After consideration of risks, benefits and other options for treatment, the patient has consented to  Procedure(s): RIGHT/LEFT HEART CATH AND CORONARY ANGIOGRAPHY (Bilateral) as a surgical intervention.  The patient's history has been reviewed, patient examined, no change in status, stable for surgery.  I have reviewed the patient's chart and labs.  Questions were answered to the patient's satisfaction.     [name]

## 2022-07-04 NOTE — OR Nursing (Signed)
Patient had 4 second run of vtach noted on monitor, patient was/is asymptomatic, vss, paged Dr. Fletcher Anon and he states that is ok and no new orders needed.  Ortencia Kick, RN

## 2022-07-05 ENCOUNTER — Encounter: Payer: Self-pay | Admitting: Cardiovascular Disease

## 2022-07-05 LAB — POCT I-STAT EG7
Acid-Base Excess: 2 mmol/L (ref 0.0–2.0)
Bicarbonate: 27.3 mmol/L (ref 20.0–28.0)
Calcium, Ion: 1.27 mmol/L (ref 1.15–1.40)
HCT: 40 % (ref 36.0–46.0)
Hemoglobin: 13.6 g/dL (ref 12.0–15.0)
O2 Saturation: 64 %
Potassium: 3.5 mmol/L (ref 3.5–5.1)
Sodium: 139 mmol/L (ref 135–145)
TCO2: 29 mmol/L (ref 22–32)
pCO2, Ven: 45.4 mmHg (ref 44–60)
pH, Ven: 7.388 (ref 7.25–7.43)
pO2, Ven: 34 mmHg (ref 32–45)

## 2022-07-18 ENCOUNTER — Other Ambulatory Visit: Payer: Self-pay

## 2022-07-24 ENCOUNTER — Encounter: Payer: Self-pay | Admitting: Medical

## 2022-07-24 ENCOUNTER — Ambulatory Visit: Payer: Self-pay | Attending: Medical | Admitting: Medical

## 2022-07-24 ENCOUNTER — Other Ambulatory Visit: Payer: Self-pay

## 2022-07-24 ENCOUNTER — Other Ambulatory Visit
Admission: RE | Admit: 2022-07-24 | Discharge: 2022-07-24 | Disposition: A | Discharge status: Home / Self Care | Payer: Self-pay | Source: Ambulatory Visit | Attending: Medical | Admitting: Medical

## 2022-07-24 VITALS — BP 116/79 | HR 66 | Ht 67.0 in | Wt 279.0 lb

## 2022-07-24 DIAGNOSIS — I2510 Atherosclerotic heart disease of native coronary artery without angina pectoris: Secondary | ICD-10-CM

## 2022-07-24 DIAGNOSIS — E782 Mixed hyperlipidemia: Secondary | ICD-10-CM

## 2022-07-24 DIAGNOSIS — Z87891 Personal history of nicotine dependence: Secondary | ICD-10-CM

## 2022-07-24 DIAGNOSIS — I10 Essential (primary) hypertension: Secondary | ICD-10-CM

## 2022-07-24 DIAGNOSIS — I5022 Chronic systolic (congestive) heart failure: Secondary | ICD-10-CM

## 2022-07-24 LAB — BRAIN NATRIURETIC PEPTIDE: B Natriuretic Peptide: 21.9 pg/mL (ref 0.0–100.0)

## 2022-07-24 MED ORDER — SPIRONOLACTONE 25 MG PO TABS
25.0000 mg | ORAL_TABLET | Freq: Every day | ORAL | 3 refills | Status: DC
  Filled 2022-07-24: qty 90, 90d supply, fill #0

## 2022-07-24 NOTE — Patient Instructions (Signed)
Medication Instructions:  Your physician has recommended you make the following change in your medication:  START - spironolactone (ALDACTONE) 25 MG tablet - Take 1 tablet by mouth once daily  *If you need a refill on your cardiac medications before your next appointment, please call your pharmacy*  Lab Work: Your physician recommends that you get lab work today: BNP [facility] Entrance at [facility] 1st desk on the right to check in (REGISTRATION)  Lab hours: Monday- Friday (7:30 am- 5:30 pm)  If you have labs (blood work) drawn today and your tests are completely normal, you will receive your results only by: MyChart Message (if you have MyChart) OR A paper copy in the mail If you have any lab test that is abnormal or we need to change your treatment, we will call you to review the results.  Testing/Procedures: -None ordered  Follow-Up: At [facility], you and your health needs are our priority.  As part of our continuing mission to provide you with exceptional heart care, we have created designated Provider Care Teams.  These Care Teams include your primary Cardiologist (physician) and Advanced Practice Providers (APPs -  Physician Assistants and Nurse Practitioners) who all work together to provide you with the care you need, when you need it.  We recommend signing up for the patient portal called "MyChart".  Sign up information is provided on this After Visit Summary.  MyChart is used to connect with patients for Virtual Visits (Telemedicine).  Patients are able to view lab/test results, encounter notes, upcoming appointments, etc.  Non-urgent messages can be sent to your provider as well.   To learn more about what you can do with MyChart, go to [email or web address].    Your next appointment:   1 month(s)  Provider:   You may see Lorine Bears, MD or one of the following Advanced Practice Providers on your designated Care Team:   Nicolasa Ducking, NP Eula Listen,  PA-C Cadence Fransico Michael, PA-C Charlsie Quest, NP   Other Instructions -None

## 2022-07-24 NOTE — Progress Notes (Unsigned)
Cardiology Office Note:    Date:  07/25/2022   ID:  Kathy [name], DOB [birth year], MRN [id number]  PCP:  Rolm Gala, NP  [facility] Cardiologist:  Lorine Bears, MD  [facility] Electrophysiologist:  None   Referring MD: Rolm Gala, NP   Chief Complaint: 1 month follow-up  History of Present Illness:    Kathy [name] is a 57 y.o. female with a hx of chronic chest pain, left bundle branch block, chronic diastolic heart failure, mild nonobstructive CAD by multiple catheterizations, allergy to aspirin on long-term Plavix, previous tobacco use, obesity, hyperlipidemia who presents for follow-up.  Patient was hospitalized in March 2022 with acute on chronic diastolic heart failure in the setting of excessive sodium and fluid intake.  She improved with IV Lasix.  Echo showed EF of 50 to 55%.  Patient was seen in the office in early 2024 reporting worsening exertional dyspnea.  She had an echocardiogram which showed EF of 40 to 45%, mild pulmonary hypertension and mild to moderate MR.  He did not tolerate Jardiance due to itching.  She was seen 06/23/2022 reporting persistent dyspnea on exertion was set up for right and left heart cath.  Right and left heart cath 07/04/2022 showed mild left ventricular systolic dysfunction, LVEDP was normal, LVEF 50 to 55%, minimal nonobstructive CAD, right heart cath showed normal filling pressures, normal pulmonary pressure and normal cardiac output.  Today, the patient reports some weight gain. Fluid pills were decreased prior to cath. Weight 269-271lbs. Scale showed 281.6lbs. she eats a low salt diet.   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 02/2016 Echo: EF 45%; b. 03/2016 Echo: EF 55%; c. 12/2019 Echo: EF 55-60%, no rwma, gr2 DD, nl RV size/fxn, mildly dil RA, mild MR.   Asthma    Chronic chest pain    a. ? vasospasm vs endothelial dysfxn. Improved w/  Ranexa but couldn't afford.   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 Presumed in setting of prior h/o MI w/ insignificant coronary dzs.   Diabetes mellitus without complication    Heart murmur    History of NICM (nonischemic cardiomyopathy) (HCC)    a. 02/2016 Echo: EF 45%; b. 12/2019 Echo: EF 55-60%.   Hyperlipidemia    Hypertension    Myocardial infarction    Non-obstructive Coronary artery disease    a. Prior caths in 2007, 2011, 2012 - nonobs dzs; b. 03/2016 Cath ([facility]): LM nl, LAD/LCX/RCA insignificant dzs.   Obesity    Rate-dependent LBBB (left bundle branch block)    Remote Tobacco abuse    a. 30+ pack year history - quit 2019.   Seizures     Past Surgical History:  Procedure Laterality Date   ANTERIOR CRUCIATE LIGAMENT REPAIR     BACK SURGERY     L4 and L5   CARDIAC CATHETERIZATION  04/09/2016   COLONOSCOPY     COLONOSCOPY WITH PROPOFOL N/A 08/04/2019   Procedure: COLONOSCOPY WITH PROPOFOL;  Surgeon: Toney Reil, MD;  Location[facility] ENDOSCOPY;  Service: Gastroenterology;  Laterality: N/A;   EMPYEMA DRAINAGE     at age 40   RIGHT/LEFT HEART CATH AND CORONARY ANGIOGRAPHY Bilateral 07/04/2022   Procedure: RIGHT/LEFT HEART CATH AND CORONARY ANGIOGRAPHY;  Surgeon: Iran Ouch, MD;  Location: [facility] INVASIVE CV LAB;  Service: Cardiovascular;  Laterality: Bilateral;   TONSILLECTOMY     TONSILLECTOMY AND ADENOIDECTOMY  2008  TUBAL LIGATION      Current Medications: Current Meds  Medication Sig   blood glucose meter kit and supplies KIT Dispense based on patient and insurance preference. Use up to four times daily as directed. (FOR ICD-9 250.00, 250.01).   Blood Pressure Monitoring (BLOOD PRESSURE KIT) KIT 1 kit by Does not apply route daily as needed.   clopidogrel (PLAVIX) 75 MG tablet Take 1 tablet (75 mg total) by mouth once daily.   diphenhydrAMINE (BENADRYL) 50 MG tablet Take 50 mg by mouth 2 (two) times daily.   EPINEPHrine 0.3 mg/0.3 mL IJ SOAJ injection  Inject 0.3 into the muscle as needed for anaphylaxis   glucose blood (RIGHTEST GS550 BLOOD GLUCOSE) test strip Use as directed to test blood sugar.   Insulin Pen Needle 32G X 6 MM MISC Use one pen needle with Ozempic once every week   isosorbide mononitrate (IMDUR) 60 MG 24 hr tablet TAKE 1 TABLET BY MOUTH TWICE DAILY.   metoprolol tartrate (LOPRESSOR) 25 MG tablet Take 1 tablet (25 mg total) by mouth 2 (two) times daily.   nitroGLYCERIN (NITROSTAT) 0.4 MG SL tablet 1 TABLET UNDER TONGUE AS NEEDED FOR CHEST PAIN EVERY 5 MINUTES FOR MAX OF 3 DOSES IN 15 MINUTES; IF NO RELIEF AFTER 1ST DOSE CALL 911   pantoprazole (PROTONIX) 40 MG tablet Take 1 tablet (40 mg total) by mouth once daily.   potassium chloride SA (KLOR-CON M) 20 MEQ tablet Take 1 tablet (20 mEq total) by mouth 2 (two) times daily.   ranolazine (RANEXA) 500 MG 12 hr tablet Take 1 tablet (500 mg total) by mouth 2 (two) times daily.   Rightest GL300 Lancets MISC Use as directed to test blood sugar.   Semaglutide, 1 MG/DOSE, (OZEMPIC, 1 MG/DOSE,) 4 MG/3ML SOPN Inject 1 mg into the skin once a week. Instead of Trulicity   simvastatin (ZOCOR) 20 MG tablet Take 1 tablet (20 mg total) by mouth once daily.   torsemide (DEMADEX) 20 MG tablet Take 2 tablets (40 mg total) by mouth daily.   [DISCONTINUED] spironolactone (ALDACTONE) 25 MG tablet TAKE 1/2 TABLET BY MOUTH ONCE EVERY DAY.     Allergies:   Aspirin, Beef allergy, Celebrex [celecoxib], Fentanyl, Gabapentin, Hydrocodone-acetaminophen, Latex, Lisinopril, Morphine, Oxycodone-acetaminophen, Percocet [oxycodone-acetaminophen], Pork allergy, Simvastatin, Simvastatin-high dose, Skelaxin [metaxalone], Beef-derived products, Fluticasone propionate, Pork-derived products, Vicodin [hydrocodone-acetaminophen], Albuterol, and Jardiance [empagliflozin]   Social History   Socioeconomic History   Marital status: Divorced    Spouse name: Not on file   Number of children: 2   Years of education:  pre-requisites    Highest education level: GED or equivalent  Occupational History   Occupation: unemployed  Tobacco Use   Smoking status: Former    Packs/day: 1.00    Years: 30.00    Additional pack years: 0.00    Total pack years: 30.00    Types: Cigarettes    Quit date: 08/16/2017    Years since quitting: 4.9   Smokeless tobacco: Never  Vaping Use   Vaping Use: Never used  Substance and Sexual Activity   Alcohol use: Not Currently    Comment: last use 01/2022   Drug use: Not Currently    Comment: Previously used cocaine in the 1990s   Sexual activity: Yes    Birth control/protection: Condom  Other Topics Concern   Not on file  Social History Narrative   Currently on food stamps. They help somewhat but still difficult to buy food that she needs. Boyfriend helps pay  rent with disability, they live together. Roof is leaking really badly. Lives in modular home. Social services bought tarp which helped, but it deteriorated from heat.    Social Determinants of Health   Financial Resource Strain: High Risk (02/12/2018)   Overall Financial Resource Strain (CARDIA)    Difficulty of Paying Living Expenses: Hard  Food Insecurity: No Food Insecurity (10/20/2021)   Hunger Vital Sign    Worried About Running Out of Food in the Last Year: Never true    Ran Out of Food in the Last Year: Never true  Transportation Needs: No Transportation Needs (10/20/2021)   PRAPARE - Administrator, Civil Service (Medical): No    Lack of Transportation (Non-Medical): No  Physical Activity: Sufficiently Active (11/06/2017)   Exercise Vital Sign    Days of Exercise per Week: 2 days    Minutes of Exercise per Session: 100 min  Stress: Stress Concern Present (11/06/2017)   [employer] of Occupational Health - Occupational Stress Questionnaire    Feeling of Stress : To some extent  Social Connections: Moderately Isolated (11/06/2017)   Social Connection and Isolation Panel [NHANES]     Frequency of Communication with Friends and Family: More than three times a week    Frequency of Social Gatherings with Friends and Family: Once a week    Attends Religious Services: Never    Database administrator or Organizations: No    Attends Engineer, structural: Never    Marital Status: Divorced     Family History: The patient's family history includes Asthma in her daughter and son; COPD in her sister; Colon cancer in her maternal grandfather and mother; Diabetes in her mother and sister; Heart attack in her father and mother; Heart disease in her maternal grandmother and paternal grandfather; Hyperlipidemia in her maternal grandfather and paternal grandfather; Hypertension in her daughter, maternal grandfather, maternal grandmother, and paternal grandfather; Kidney disease in her sister; Migraines in her sister; Stroke in her father, mother, paternal grandmother, and sister; Thyroid disease in her daughter, mother, sister, and sister.  ROS:   Please see the history of present illness.     All other systems reviewed and are negative.  EKGs/Labs/Other Studies Reviewed:    The following studies were reviewed today:  Right and left cardiac cath 06/2022     There is mild left ventricular systolic dysfunction.   LV end diastolic pressure is normal.   The left ventricular ejection fraction is 50-55% by visual estimate.   1.  Minimal nonobstructive coronary artery disease. 2.  Mildly reduced LV systolic function with an EF of 50%. 3.  Right heart catheterization showed normal filling pressures, normal pulmonary pressure and normal cardiac output.   Recommendations: Continue medical therapy. No evidence of volume overload.  I decreased the dose of torsemide to 40 mg daily.    Echo 05/2022 1. Left ventricular ejection fraction, by estimation, is 40 to 45%. Left  ventricular ejection fraction by PLAX is 41 %. The left ventricle has  mildly decreased function. The left  ventricle demonstrates global  hypokinesis. Left ventricular diastolic  parameters are consistent with Grade I diastolic dysfunction (impaired  relaxation). The average left ventricular global longitudinal strain is  -13.6 %.   2. Right ventricular systolic function is normal. The right ventricular  size is normal. There is normal pulmonary artery systolic pressure. The  estimated right ventricular systolic pressure is 33.3 mmHg.   3. The mitral valve is normal in structure.  Mild to moderate mitral valve  regurgitation. No evidence of mitral stenosis.   4. The aortic valve has an indeterminant number of cusps. Aortic valve  regurgitation is not visualized. No aortic stenosis is present.   5. The inferior vena cava is normal in size with greater than 50%  respiratory variability, suggesting right atrial pressure of 3 mmHg.   Comparison(s): Previous LVEF reported as 50-55%.   EKG:  EKG is not ordered today.    Recent Labs: 01/18/2022: ALT 22 06/23/2022: BUN 13; Creatinine, Ser 0.87; Platelets 282 07/04/2022: Hemoglobin 13.6; Potassium 3.5; Sodium 139 07/24/2022: B Natriuretic Peptide 21.9  Recent Lipid Panel    Component Value Date/Time   CHOL 159 01/18/2022 1216   TRIG 198 (H) 01/18/2022 1216   HDL 32 (L) 01/18/2022 1216   CHOLHDL 5.0 (H) 01/18/2022 1216   CHOLHDL 5.7 02/24/2016 0618   VLDL 30 02/24/2016 0618   LDLCALC 93 01/18/2022 1216     Physical Exam:    VS:  BP 116/79   Pulse 66   Ht 5\' 7"  (1.702 m)   Wt 279 lb (126.6 kg)   LMP 06/09/2018   BMI 43.70 kg/m     Wt Readings from Last 3 Encounters:  07/24/22 279 lb (126.6 kg)  07/04/22 277 lb (125.6 kg)  06/29/22 279 lb (126.6 kg)     GEN:  Well nourished, well developed in no acute distress HEENT: Normal NECK: No JVD; No carotid bruits LYMPHATICS: No lymphadenopathy CARDIAC: RRR, no murmurs, rubs, gallops RESPIRATORY:  Clear to auscultation without rales, wheezing or rhonchi  ABDOMEN: Soft, non-tender,  non-distended MUSCULOSKELETAL:  No edema; No deformity  SKIN: Warm and dry NEUROLOGIC:  Alert and oriented x 3 PSYCHIATRIC:  Normal affect   ASSESSMENT:    1. Coronary artery disease involving native coronary artery of native heart without angina pectoris   2. Chronic systolic heart failure   3. Hyperlipidemia, mixed   4. History of tobacco use   5. Essential hypertension    PLAN:    In order of problems listed above:  Mild CAD with chronic angina No significant CAD on recent heart cath. Cath site has healed well. Continue Plavix 75mg  daily, Imdur 60mg  daily, Lopressor 25mg  BID, SL NTG, Ranexa 500mg  BID. No further ischemic work-up indicated.  Chronic systolic heart failure LVEF 40-45%. Recent cath showing no significant CAD. RHC with normal pressures and torsemide was decreased to Torsemide 40mg  daily. She reports 5-10lbs weight gain since the cath. She has trace lower leg edema on exam, but overall looks good. I will check a BNP and increase spiro to 25mg  daily. She has intolerance to Jardiance.   HLD LDL 93, continue simvastatin  Tobacco use Cessation recommended.   HTN BP is good, continue current medications.   Disposition: Follow up in 1 month(s) with MD/APP     Signed, Daijha Leggio David Stall, PA-C  07/25/2022 10:16 AM    [facility]

## 2022-07-25 ENCOUNTER — Encounter: Payer: Self-pay | Admitting: Medical

## 2022-07-26 ENCOUNTER — Other Ambulatory Visit: Payer: Self-pay

## 2022-07-26 ENCOUNTER — Ambulatory Visit: Payer: Self-pay | Admitting: Gerontology

## 2022-07-26 ENCOUNTER — Encounter: Payer: Self-pay | Admitting: Gerontology

## 2022-07-26 VITALS — BP 117/64 | HR 70 | Temp 98.1°F | Resp 16 | Ht 67.0 in | Wt 281.5 lb

## 2022-07-26 DIAGNOSIS — E782 Mixed hyperlipidemia: Secondary | ICD-10-CM

## 2022-07-26 DIAGNOSIS — G8929 Other chronic pain: Secondary | ICD-10-CM

## 2022-07-26 DIAGNOSIS — Z79899 Other long term (current) drug therapy: Secondary | ICD-10-CM

## 2022-07-26 DIAGNOSIS — K219 Gastro-esophageal reflux disease without esophagitis: Secondary | ICD-10-CM

## 2022-07-26 DIAGNOSIS — E119 Type 2 diabetes mellitus without complications: Secondary | ICD-10-CM

## 2022-07-26 LAB — POCT GLYCOSYLATED HEMOGLOBIN (HGB A1C): Hemoglobin A1C: 5.6 % (ref 4.0–5.6)

## 2022-07-26 LAB — GLUCOSE, POCT (MANUAL RESULT ENTRY): POC Glucose: 121 mg/dl — AB (ref 70–99)

## 2022-07-26 MED ORDER — SIMVASTATIN 20 MG PO TABS
20.0000 mg | ORAL_TABLET | Freq: Every day | ORAL | 1 refills | Status: DC
Start: 2022-07-26 — End: 2023-02-12
  Filled 2022-07-26 – 2022-08-17 (×2): qty 90, 90d supply, fill #0
  Filled 2022-11-14: qty 90, 90d supply, fill #1

## 2022-07-26 MED ORDER — PANTOPRAZOLE SODIUM 40 MG PO TBEC
40.0000 mg | DELAYED_RELEASE_TABLET | Freq: Every day | ORAL | 1 refills | Status: DC
Start: 2022-07-26 — End: 2022-10-26
  Filled 2022-07-26: qty 90, 90d supply, fill #0
  Filled 2022-10-25: qty 90, 90d supply, fill #1

## 2022-07-26 NOTE — Progress Notes (Signed)
Established Patient Office Visit  Subjective   Patient ID: Kathy [name], female    DOB: 03/12/1966  Age: 57 y.o. MRN: [id number]  Chief Complaint  Patient presents with   Follow-up   Diabetes    Diabetes    Kathy [name]  is a 57 y/o female who has history of Heart failure, Asthma, Hyperlipidemia, Heart Murmur,Hypertension, who presents for follow up visit. She verbalized being compliant with her medications and treatment regimen. She reports checking her weight, BP and blood glucose daily. She presented to the clinic with her logs. Her glucose ranges between 65-109 mg/dL. Her blood pressure log ranges between 124/80 mmHg to 119/74 mmHg. Her BP during this visit was 117/64 mmHg. Her Hgb A1c done 04/26/2022 was 5.5%. Her POCT glucose and HgbA1C done during this visit were 121 mg/dL. She verbalized not getting any exercise due to her heart condition. On 07/04/2022 she underwent Bilateral RIGHT/LEFT HEART CATH AND CORONARY ANGIOGRAPHY with Dr. Kirke Corin, [facility] which showed mild left ventricular systolic dysfunction, LVEDP was normal, LVEF 50 to 55%, minimal nonobstructive CAD, right heart cath showed normal filling pressures, normal pulmonary pressure and normal cardiac output. She was seen by Fransico Michael, [place] (PA-C) on 07/25/2022 during which she reported some weight gain. Her most recent BNP  was 21.9 pg/mL Her torsemide was decreased to Torsemide 40mg  daily and she was started on Spironolactone (ALDACTONE) 25 mg daily. During this visit, her recorded weight during this visit was 281.8 lbs. Though, she denies chest pain and dizziness she did endorse dyspnea with exertion. She denies suicidal or homicidal ideation and states that her mood is good. Overall, she states that she is doing well and offers no further complaints.    Review of Systems  Constitutional: Negative.   HENT: Negative.    Eyes: Negative.   Respiratory: Negative.    Cardiovascular: Negative.   Gastrointestinal: Negative.    Genitourinary: Negative.   Musculoskeletal: Negative.   Skin: Negative.   Neurological: Negative.   Endo/Heme/Allergies: Negative.   Psychiatric/Behavioral: Negative.        Objective:     LMP 06/09/2018  BP Readings from Last 3 Encounters:  07/26/22 117/64  07/24/22 116/79  07/04/22 109/86   Wt Readings from Last 3 Encounters:  07/26/22 281 lb 8 oz (127.7 kg)  07/24/22 279 lb (126.6 kg)  07/04/22 277 lb (125.6 kg)   SpO2 Readings from Last 3 Encounters:  07/26/22 97%  07/04/22 94%  06/29/22 98%      Physical Exam Constitutional:      Appearance: Normal appearance.  HENT:     Head: Normocephalic and atraumatic.     Nose: Nose normal.  Eyes:     Pupils: Pupils are equal, round, and reactive to light.  Cardiovascular:     Rate and Rhythm: Normal rate and regular rhythm.     Pulses: Normal pulses.     Heart sounds:     Gallop present.  Pulmonary:     Effort: Pulmonary effort is normal.     Breath sounds: Normal breath sounds.  Abdominal:     General: Bowel sounds are normal.     Palpations: Abdomen is soft.  Musculoskeletal:        General: Normal range of motion.     Cervical back: Normal range of motion.  Skin:    General: Skin is warm.     Capillary Refill: Capillary refill takes less than 2 seconds.  Neurological:     General: No focal deficit  present.     Mental Status: She is alert.  Psychiatric:        Mood and Affect: Mood normal.        Behavior: Behavior normal.        Thought Content: Thought content normal.        Judgment: Judgment normal.      No results found for any visits on 07/26/22.  Last CBC Lab Results  Component Value Date   WBC 7.8 06/23/2022   HGB 13.6 07/04/2022   HCT 40.0 07/04/2022   MCV 87.2 06/23/2022   MCH 28.3 06/23/2022   RDW 14.2 06/23/2022   PLT 282 06/23/2022   Last metabolic panel Lab Results  Component Value Date   GLUCOSE 103 (H) 06/23/2022   NA 139 07/04/2022   K 3.5 07/04/2022   CL 103  06/23/2022   CO2 26 06/23/2022   BUN 13 06/23/2022   CREATININE 0.87 06/23/2022   GFRNONAA >60 06/23/2022   CALCIUM 9.0 06/23/2022   PHOS 4.0 06/21/2020   PROT 7.1 01/18/2022   ALBUMIN 4.4 01/18/2022   LABGLOB 2.7 01/18/2022   AGRATIO 1.6 01/18/2022   BILITOT 0.3 01/18/2022   ALKPHOS 123 (H) 01/18/2022   AST 15 01/18/2022   ALT 22 01/18/2022   ANIONGAP 8 06/23/2022   Last lipids Lab Results  Component Value Date   CHOL 159 01/18/2022   HDL 32 (L) 01/18/2022   LDLCALC 93 01/18/2022   TRIG 198 (H) 01/18/2022   CHOLHDL 5.0 (H) 01/18/2022   Last hemoglobin A1c Lab Results  Component Value Date   HGBA1C 5.6 07/26/2022   Last thyroid functions Lab Results  Component Value Date   TSH 1.770 10/04/2017   Last vitamin D No results found for: "25OHVITD2", "25OHVITD3", "VD25OH" Last vitamin B12 and Folate No results found for: "VITAMINB12", "FOLATE"    The 10-year ASCVD risk score (Arnett DK, et al., 2019) is: 6%    Assessment & Plan:   1. Type 2 diabetes mellitus without complication, without long-term current use of insulin Her diabetes is under control. Her HgbA1C is 5.6%. She will continue current treatment regimen  - POCT Glucose (CBG) - POCT HgB A1C -Continue to check and document daily blood glucose reading  She was advised to decrease intake of sugars and concentrated sweets and exercise as tolerated   2. Gastroesophageal reflux disease, unspecified whether esophagitis present Her acid reflux is under control. She will continue current medication - pantoprazole (PROTONIX) 40 MG tablet; Take 1 tablet (40 mg total) by mouth once daily.  Dispense: 90 tablet; Refill: 1 -Avoid spicy, fatty and fried food -Avoid sodas and sour juices -Avoid heavy meals -Avoid eating 4 hours before bedtime -Elevate head of bed at night   3. Mixed hyperlipidemia Continue current treatment regimen  - simvastatin (ZOCOR) 20 MG tablet; Take 1 tablet (20 mg total) by mouth once daily.   Dispense: 90 tablet; Refill: 1 She was advised to continue low fat/cholesterol diet and exercise as tolerated   4. Chronic pain of both knees  - Ambulatory referral to Orthopedic Surgery  5. Chronic left shoulder pain  - Ambulatory referral to Orthopedic Surgery    F/U 10/25/2022 in Clinic    Onnie Graham, [place]

## 2022-07-28 ENCOUNTER — Other Ambulatory Visit: Payer: Self-pay

## 2022-08-03 ENCOUNTER — Other Ambulatory Visit: Payer: Self-pay

## 2022-08-09 ENCOUNTER — Other Ambulatory Visit
Admission: RE | Admit: 2022-08-09 | Discharge: 2022-08-09 | Disposition: A | Discharge status: Home / Self Care | Payer: Self-pay | Attending: Medical | Admitting: Medical

## 2022-08-09 DIAGNOSIS — Z79899 Other long term (current) drug therapy: Secondary | ICD-10-CM | POA: Insufficient documentation

## 2022-08-09 LAB — BASIC METABOLIC PANEL
Anion gap: 10 (ref 5–15)
BUN: 13 mg/dL (ref 6–20)
CO2: 27 mmol/L (ref 22–32)
Calcium: 9 mg/dL (ref 8.9–10.3)
Chloride: 101 mmol/L (ref 98–111)
Creatinine, Ser: 0.93 mg/dL (ref 0.44–1.00)
GFR, Estimated: >60 mL/min (ref >60)
Glucose, Bld: 112 mg/dL — ABNORMAL HIGH (ref 70–99)
Potassium: 4.4 mmol/L (ref 3.5–5.1)
Sodium: 138 mmol/L (ref 135–145)

## 2022-08-10 ENCOUNTER — Encounter: Payer: Self-pay | Admitting: Family

## 2022-08-10 ENCOUNTER — Other Ambulatory Visit: Payer: Self-pay

## 2022-08-10 ENCOUNTER — Ambulatory Visit: Payer: Self-pay | Attending: Family | Admitting: Family

## 2022-08-10 VITALS — BP 142/90 | HR 72 | Wt 281.0 lb

## 2022-08-10 DIAGNOSIS — E119 Type 2 diabetes mellitus without complications: Secondary | ICD-10-CM

## 2022-08-10 DIAGNOSIS — Z7902 Long term (current) use of antithrombotics/antiplatelets: Secondary | ICD-10-CM | POA: Insufficient documentation

## 2022-08-10 DIAGNOSIS — G8929 Other chronic pain: Secondary | ICD-10-CM | POA: Insufficient documentation

## 2022-08-10 DIAGNOSIS — I5032 Chronic diastolic (congestive) heart failure: Secondary | ICD-10-CM

## 2022-08-10 DIAGNOSIS — I252 Old myocardial infarction: Secondary | ICD-10-CM | POA: Insufficient documentation

## 2022-08-10 DIAGNOSIS — E785 Hyperlipidemia, unspecified: Secondary | ICD-10-CM | POA: Insufficient documentation

## 2022-08-10 DIAGNOSIS — I110 Hypertensive heart disease with heart failure: Secondary | ICD-10-CM | POA: Insufficient documentation

## 2022-08-10 DIAGNOSIS — Z87891 Personal history of nicotine dependence: Secondary | ICD-10-CM | POA: Insufficient documentation

## 2022-08-10 DIAGNOSIS — I447 Left bundle-branch block, unspecified: Secondary | ICD-10-CM | POA: Insufficient documentation

## 2022-08-10 DIAGNOSIS — R079 Chest pain, unspecified: Secondary | ICD-10-CM

## 2022-08-10 DIAGNOSIS — Z7985 Long-term (current) use of injectable non-insulin antidiabetic drugs: Secondary | ICD-10-CM | POA: Insufficient documentation

## 2022-08-10 DIAGNOSIS — Z79899 Other long term (current) drug therapy: Secondary | ICD-10-CM | POA: Insufficient documentation

## 2022-08-10 DIAGNOSIS — Z5986 Financial insecurity: Secondary | ICD-10-CM | POA: Insufficient documentation

## 2022-08-10 DIAGNOSIS — Z8249 Family history of ischemic heart disease and other diseases of the circulatory system: Secondary | ICD-10-CM | POA: Insufficient documentation

## 2022-08-10 DIAGNOSIS — R0789 Other chest pain: Secondary | ICD-10-CM | POA: Insufficient documentation

## 2022-08-10 DIAGNOSIS — I2510 Atherosclerotic heart disease of native coronary artery without angina pectoris: Secondary | ICD-10-CM | POA: Insufficient documentation

## 2022-08-10 DIAGNOSIS — I25118 Atherosclerotic heart disease of native coronary artery with other forms of angina pectoris: Secondary | ICD-10-CM

## 2022-08-10 DIAGNOSIS — I10 Essential (primary) hypertension: Secondary | ICD-10-CM

## 2022-08-10 DIAGNOSIS — E669 Obesity, unspecified: Secondary | ICD-10-CM | POA: Insufficient documentation

## 2022-08-10 DIAGNOSIS — J45909 Unspecified asthma, uncomplicated: Secondary | ICD-10-CM | POA: Insufficient documentation

## 2022-08-10 MED ORDER — TORSEMIDE 20 MG PO TABS
60.0000 mg | ORAL_TABLET | Freq: Every day | ORAL | 3 refills | Status: DC
  Filled 2022-08-10: qty 270, 90d supply, fill #0

## 2022-08-10 MED ORDER — SPIRONOLACTONE 25 MG PO TABS
25.0000 mg | ORAL_TABLET | Freq: Every day | ORAL | 3 refills | Status: DC
  Filled 2022-08-10 – 2022-10-25 (×2): qty 90, 90d supply, fill #0

## 2022-08-10 NOTE — Patient Instructions (Signed)
Increase your torsemide to 3 tablets every day.

## 2022-08-10 NOTE — Progress Notes (Signed)
PCP: Rolm Gala, NP (last seen 04/24) Primary Cardiologist: Lorine Bears, MD (last seen 04/24)   HPI:  Kathy [name] is a 57 y/o female with a history of asthma, CAD, hyperlipidemia, HTN, seizures, DM, previous tobacco use and chronic heart failure.   Echo 06/08/22: EF 40-45% along with mild/moderate MR. Echo 06/14/20: EF of 50-55% along with mild LVH. Echo 01/06/20: EF of 55-60% along with mild LVH and mild MR. Echo 02/27/17: EF of 55-60%.  RHC/LHC 07/04/22:   There is mild left ventricular systolic dysfunction.   LV end diastolic pressure is normal.   The left ventricular ejection fraction is 50-55% by visual estimate.  1.  Minimal nonobstructive coronary artery disease. 2.  Mildly reduced LV systolic function with an EF of 50%. 3.  Right heart catheterization showed normal filling pressures, normal pulmonary pressure and normal cardiac output.  Has not been admitted or been in the ED in the last 6 months.   She presents today for a HF follow up visit with a chief complaint of SOB with exertion. Chronic in nature although she feels like it has worsened since her torsemide was decreased to 40mg  daily after her recent cath. She has associated SOB, nausea, palpitations, chest tightness radiating to the right side, pain in both knees, chronic back pain and slight weight gain along with this.   Since last visit here, she has had her cath which showed minimal nonobstructive CAD. Her torsemide was then reduced to 40mg  but she feels more SOB since that time.   ROS: All systems negative except as listed in HPI, PMH and Problem List.  SH:  Social History   Socioeconomic History   Marital status: Divorced    Spouse name: Not on file   Number of children: 2   Years of education: pre-requisites    Highest education level: GED or equivalent  Occupational History   Occupation: unemployed  Tobacco Use   Smoking status: Former    Packs/day: 1.00    Years: 30.00    Additional pack years:  0.00    Total pack years: 30.00    Types: Cigarettes    Quit date: 08/16/2017    Years since quitting: 4.9   Smokeless tobacco: Never  Vaping Use   Vaping Use: Never used  Substance and Sexual Activity   Alcohol use: Not Currently    Comment: last use 01/2022   Drug use: Not Currently    Comment: Previously used cocaine in the 1990s   Sexual activity: Yes    Birth control/protection: Condom  Other Topics Concern   Not on file  Social History Narrative   Currently on food stamps. They help somewhat but still difficult to buy food that she needs. Boyfriend helps pay rent with disability, they live together. Roof is leaking really badly. Lives in modular home. Social services bought tarp which helped, but it deteriorated from heat.    Social Determinants of Health   Financial Resource Strain: High Risk (02/12/2018)   Overall Financial Resource Strain (CARDIA)    Difficulty of Paying Living Expenses: Hard  Food Insecurity: No Food Insecurity (10/20/2021)   Hunger Vital Sign    Worried About Running Out of Food in the Last Year: Never true    Ran Out of Food in the Last Year: Never true  Transportation Needs: No Transportation Needs (10/20/2021)   PRAPARE - Administrator, Civil Service (Medical): No    Lack of Transportation (Non-Medical): No  Physical Activity: Sufficiently  Active (11/06/2017)   Exercise Vital Sign    Days of Exercise per Week: 2 days    Minutes of Exercise per Session: 100 min  Stress: Stress Concern Present (11/06/2017)   [employer] of Occupational Health - Occupational Stress Questionnaire    Feeling of Stress : To some extent  Social Connections: Moderately Isolated (11/06/2017)   Social Connection and Isolation Panel [NHANES]    Frequency of Communication with Friends and Family: More than three times a week    Frequency of Social Gatherings with Friends and Family: Once a week    Attends Religious Services: Never    Database administrator or  Organizations: No    Attends Banker Meetings: Never    Marital Status: Divorced  Catering manager Violence: Not At Risk (11/06/2017)   Humiliation, Afraid, Rape, and Kick questionnaire    Fear of Current or Ex-Partner: No    Emotionally Abused: No    Physically Abused: No    Sexually Abused: No    FH:  Family History  Problem Relation Age of Onset   Kidney disease Sister    Diabetes Sister    COPD Sister    Stroke Sister    Thyroid disease Sister    Diabetes Mother    Stroke Mother    Heart attack Mother    Thyroid disease Mother    Colon cancer Mother    Stroke Father    Heart attack Father    Asthma Daughter    Hypertension Daughter    Thyroid disease Daughter    Asthma Son    Heart disease Maternal Grandmother    Hypertension Maternal Grandmother    Colon cancer Maternal Grandfather    Hypertension Maternal Grandfather    Hyperlipidemia Maternal Grandfather    Stroke Paternal Grandmother    Hypertension Paternal Grandfather    Hyperlipidemia Paternal Grandfather    Heart disease Paternal Grandfather    Thyroid disease Sister    Migraines Sister     Past Medical History:  Diagnosis Date   (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 02/2016 Echo: EF 45%; b. 03/2016 Echo: EF 55%; c. 12/2019 Echo: EF 55-60%, no rwma, gr2 DD, nl RV size/fxn, mildly dil RA, mild MR.   Asthma    Chronic chest pain    a. ? vasospasm vs endothelial dysfxn. Improved w/ Ranexa but couldn't afford.   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 Presumed in setting of prior h/o MI w/ insignificant coronary dzs.   Diabetes mellitus without complication (HCC)    Heart murmur    History of NICM (nonischemic cardiomyopathy) (HCC)    a. 02/2016 Echo: EF 45%; b. 12/2019 Echo: EF 55-60%.   Hyperlipidemia    Hypertension    Myocardial infarction [facility])    Non-obstructive Coronary artery disease    a. Prior caths in 2007, 2011, 2012 - nonobs dzs; b. 03/2016 Cath  ([facility]): LM nl, LAD/LCX/RCA insignificant dzs.   Obesity    Rate-dependent LBBB (left bundle branch block)    Remote Tobacco abuse    a. 30+ pack year history - quit 2019.   Seizures (HCC)    Prior to Admission medications   Medication Sig Start Date End Date Taking? Authorizing Provider  clopidogrel (PLAVIX) 75 MG tablet Take 1 tablet (75 mg total) by mouth once daily. 04/28/22  Yes Iran Ouch, MD  diphenhydrAMINE (BENADRYL) 50 MG tablet Take 50 mg by mouth 2 (two) times  daily.   Yes [provider]  isosorbide mononitrate (IMDUR) 60 MG 24 hr tablet TAKE 1 TABLET BY MOUTH TWICE DAILY. 02/22/22  Yes Furth, Cadence H, PA-C  metoprolol tartrate (LOPRESSOR) 25 MG tablet Take 1 tablet (25 mg total) by mouth 2 (two) times daily. 04/28/22 04/28/23 Yes Iran Ouch, MD  pantoprazole (PROTONIX) 40 MG tablet Take 1 tablet (40 mg total) by mouth once daily. 07/26/22  Yes Iloabachie, Chioma E, NP  potassium chloride SA (KLOR-CON M) 20 MEQ tablet Take 1 tablet (20 mEq total) by mouth 2 (two) times daily. 09/07/21  Yes Jessicia Napolitano, Inetta Fermo A, FNP  ranolazine (RANEXA) 500 MG 12 hr tablet Take 1 tablet (500 mg total) by mouth 2 (two) times daily. 04/28/22  Yes Iran Ouch, MD  Semaglutide, 1 MG/DOSE, (OZEMPIC, 1 MG/DOSE,) 4 MG/3ML SOPN Inject 1 mg into the skin once a week. Instead of Trulicity Patient taking differently: Inject 1 mg into the skin once a week. Instead of Trulicity. On Wednesdays 1800 05/11/22  Yes Iloabachie, Chioma E, NP  simvastatin (ZOCOR) 20 MG tablet Take 1 tablet (20 mg total) by mouth once daily. 07/26/22  Yes Iloabachie, Chioma E, NP  blood glucose meter kit and supplies KIT Dispense based on patient and insurance preference. Use up to four times daily as directed. (FOR ICD-9 250.00, 250.01). 03/02/20   Iloabachie, Chioma E, NP  Blood Pressure Monitoring (BLOOD PRESSURE KIT) KIT 1 kit by Does not apply route daily as needed. 01/26/22   Iloabachie, Chioma E, NP  EPINEPHrine  0.3 mg/0.3 mL IJ SOAJ injection Inject 0.3 into the muscle as needed for anaphylaxis 08/16/21   Iloabachie, Chioma E, NP  glucose blood (RIGHTEST GS550 BLOOD GLUCOSE) test strip Use as directed to test blood sugar. 05/16/21   Skipper Cliche, [facility]  Insulin Pen Needle 32G X 6 MM MISC Use one pen needle with Ozempic once every week 11/03/21   Iloabachie, Chioma E, NP  nitroGLYCERIN (NITROSTAT) 0.4 MG SL tablet 1 TABLET UNDER TONGUE AS NEEDED FOR CHEST PAIN EVERY 5 MINUTES FOR MAX OF 3 DOSES IN 15 MINUTES; IF NO RELIEF AFTER 1ST DOSE CALL 911 05/13/19   Michiel Cowboy A, PA-C  Rightest GL300 Lancets MISC Use as directed to test blood sugar. 11/08/21   Iloabachie, Chioma E, NP  spironolactone (ALDACTONE) 25 MG tablet Take 1 tablet (25 mg total) by mouth daily. 08/10/22   Delma Freeze, FNP  torsemide (DEMADEX) 20 MG tablet Take 2 tablets (40 mg total) by mouth daily. 08/10/22   Delma Freeze, FNP     Vitals:   08/10/22 1511  BP: (!) 142/90  Pulse: 72  SpO2: 100%  Weight: 281 lb (127.5 kg)   Lab Results  Component Value Date   CREATININE 0.93 08/09/2022   CREATININE 0.87 06/23/2022   CREATININE 0.95 05/31/2022     PHYSICAL EXAM:  General:  Well appearing. No resp difficulty HEENT: normal Neck: supple. JVP flat. No lymphadenopathy or thryomegaly appreciated. Cor: PMI normal. Regular rate & rhythm. No rubs, gallops or murmurs. Lungs: clear Abdomen: soft, nontender, nondistended. No hepatosplenomegaly. No bruits or masses. Good bowel sounds. Extremities: no cyanosis, clubbing, rash. Trace pitting edema Neuro: alert & oriented x3, cranial nerves grossly intact. Moves all 4 extremities w/o difficulty. Affect pleasant.   ECG today: NSR with chronic LBBB   Assessment & Plan:  1: Chronic heart failure with preserved ejection fraction with LVH- - NYHA class II - euvolemic today  - weighing  daily; reminded to call for an overnight weight gain of >2 pounds or a weekly weight gain of >5 pounds -  weight up 2 pounds from last visit here 6 weeks ago - etiology likely LBBB as cath showed nonobstructive CAD - EKG today showed NSR/ LBBB - echo 06/08/22: EF 40-45% along with mild/moderate MR. Echo 06/14/20: EF of 50-55% along with mild LVH. Echo 01/06/20: EF of 55-60% along with mild LVH and mild MR. Echo 02/27/17: EF of 55-60%. - has been following a daily 2L fluid intake along with closely following a 2000mg  sodium diet - continue spironolactone 25mg  daily - increase torsemide to 60mg  daily/ potassium BID - will message cardiology and ask them to check BMET at their visit in 2 weeks - jardiance caused HA, nausea and increased thirst so this was stopped - BNP 06/12/20 was 45.8  2: HTN- - BP 142/90 - saw PCP (llobachie NP) 04/24 - BMP 08/09/22 reviewed and showed sodium 138, potassium 4.4, creatinine 0.93 and GFR >60  3: DM- - A1c 07/26/22 was 5.6% - ozempic 1mg  weekly  4: Nonobstructive CAD- - saw cardiology Fransico Michael) 04/24; returns 08/24/22 - RHC/LHC 07/04/22:   There is mild left ventricular systolic dysfunction.   LV end diastolic pressure is normal.   The left ventricular ejection fraction is 50-55% by visual estimate.  1.  Minimal nonobstructive coronary artery disease. 2.  Mildly reduced LV systolic function with an EF of 50%. 3.  Right heart catheterization showed normal filling pressures, normal pulmonary pressure and normal cardiac output. - continue plavix 75mg  daily - continue isosorbide MN 60mg  BID - continue metoprolol tartrate 25mg  BID - continue ranexa 500mg  BID - EKG today: NSR with chronic LBBB  Return in 6 weeks, sooner if needed

## 2022-08-10 NOTE — Progress Notes (Signed)
[facility] HEART FAILURE CLINIC - Pharmacist Education Note  Assessment Kathy [name] is a 57 y.o. female with HFmrEF (EF 41-49%) presenting to the Heart Failure Clinic for follow up. Patient reports no medication access issues and no new adverse drug events. She reports weighing herself daily and has noticed 3lb weight gain over the past few days. She states that she strictly adheres to 2L fluid per day and 2g salt per day. She does report reduced appetite and early satiety since starting semaglutide. She states that she had significant shortness of breath last night that kept her up. She also reports some leg swelling today. Her torsemide was reduced from 60 mg every morning and 20 mg every night to 40 mg daily in April.   Recent ED Visit (past 6 months): none  Guideline-Directed Medical Therapy/Evidence Based Medicine ACE/ARB/ARNI:  none Beta Blocker: Metoprolol tartrate 25 mg twice daily Aldosterone Antagonist: Spironolactone 25 mg daily Diuretic: Torsemide 40 mg daily SGLT2i:  none, ADR to empagliflozin  Adherence Assessment Do you ever forget to take your medication? [] Yes [x] No  Do you ever skip doses due to side effects? [] Yes [x] No  Do you have trouble affording your medicines? [] Yes [x] No  Are you ever unable to pick up your medication due to transportation difficulties? [] Yes [x] No  Do you ever stop taking your medications because you don't believe they are helping? [] Yes [x] No  Do you check your weight daily? [x] Yes [] No  Adherence strategy: Pill box Barriers to obtaining medications: none reported   Diagnostics ECHO: Date 06/08/2022, EF 40-45%, GHK, G1DD Cath: Date 07/04/2022, EF 50-55%, minimal nonobstructive CAD, normal filling pressures, no evidence of volume overload  Vitals    08/10/2022    3:11 PM 07/26/2022    2:16 PM 07/24/2022    2:29 PM  Vitals with BMI  Height  5\' 7"  5\' 7"   Weight 281 lbs 281 lbs 8 oz 279 lbs  BMI 44 44.08 43.69  Systolic 142 117 161   Diastolic 90 64 79  Pulse 72 70 66     Recent Labs    Latest Ref Rng & Units 08/09/2022   12:54 PM 07/04/2022   11:20 AM 07/04/2022   11:17 AM  BMP  Glucose 70 - 99 mg/dL 096     BUN 6 - 20 mg/dL 13     Creatinine 0.45 - 1.00 mg/dL 4.09     Sodium 811 - 914 mmol/L 138  139  138   Potassium 3.5 - 5.1 mmol/L 4.4  3.5  3.6   Chloride 98 - 111 mmol/L 101     CO2 22 - 32 mmol/L 27     Calcium 8.9 - 10.3 mg/dL 9.0       Past Medical History Past Medical History:  Diagnosis Date   (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 02/2016 Echo: EF 45%; b. 03/2016 Echo: EF 55%; c. 12/2019 Echo: EF 55-60%, no rwma, gr2 DD, nl RV size/fxn, mildly dil RA, mild MR.   Asthma    Chronic chest pain    a. ? vasospasm vs endothelial dysfxn. Improved w/ Ranexa but couldn't afford.   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 Presumed in setting of prior h/o MI w/ insignificant coronary dzs.   Diabetes mellitus without complication (HCC)    Heart murmur    History of NICM (nonischemic cardiomyopathy) (HCC)    a. 02/2016 Echo: EF 45%; b. 12/2019 Echo: EF 55-60%.   Hyperlipidemia  Hypertension    Myocardial infarction [facility])    Non-obstructive Coronary artery disease    a. Prior caths in 2007, 2011, 2012 - nonobs dzs; b. 03/2016 Cath ([facility]): LM nl, LAD/LCX/RCA insignificant dzs.   Obesity    Rate-dependent LBBB (left bundle branch block)    Remote Tobacco abuse    a. 30+ pack year history - quit 2019.   Seizures (HCC)     Plan CHF Increase torsemide to 60 mg daily Continue daily weights, 2L fluid restriction, and 2g salt restriction Continue spironolactone and metoprolol   Time spent: 15 minutes  Celene Squibb, PharmD PGY1 Pharmacy Resident 08/10/2022 3:54 PM

## 2022-08-11 ENCOUNTER — Encounter: Payer: Self-pay | Admitting: Family

## 2022-08-11 ENCOUNTER — Other Ambulatory Visit: Payer: Self-pay

## 2022-08-17 ENCOUNTER — Other Ambulatory Visit: Payer: Self-pay

## 2022-08-17 ENCOUNTER — Other Ambulatory Visit: Payer: Self-pay | Admitting: Family

## 2022-08-17 DIAGNOSIS — I10 Essential (primary) hypertension: Secondary | ICD-10-CM

## 2022-08-17 MED ORDER — POTASSIUM CHLORIDE CRYS ER 20 MEQ PO TBCR
20.0000 meq | EXTENDED_RELEASE_TABLET | Freq: Two times a day (BID) | ORAL | 3 refills | Status: DC
Start: 2022-08-17 — End: 2023-08-08
  Filled 2022-08-17: qty 180, 90d supply, fill #0
  Filled 2022-11-14: qty 180, 90d supply, fill #1
  Filled 2023-02-14: qty 180, 90d supply, fill #2
  Filled 2023-05-13: qty 180, 90d supply, fill #3

## 2022-08-18 ENCOUNTER — Other Ambulatory Visit: Payer: Self-pay

## 2022-08-21 ENCOUNTER — Other Ambulatory Visit: Payer: Self-pay

## 2022-08-21 ENCOUNTER — Other Ambulatory Visit: Payer: Self-pay | Admitting: Pharmacist

## 2022-08-21 MED FILL — Lancets: 30 days supply | Qty: 100 | Fill #2 | Status: AC

## 2022-08-23 ENCOUNTER — Other Ambulatory Visit: Payer: Self-pay

## 2022-08-24 ENCOUNTER — Other Ambulatory Visit: Payer: Self-pay | Admitting: Gerontology

## 2022-08-24 ENCOUNTER — Ambulatory Visit: Payer: Self-pay | Attending: Medical | Admitting: Medical

## 2022-08-24 ENCOUNTER — Other Ambulatory Visit: Payer: Self-pay

## 2022-08-24 ENCOUNTER — Encounter: Payer: Self-pay | Admitting: Medical

## 2022-08-24 VITALS — BP 120/90 | HR 75 | Ht 67.0 in | Wt 275.5 lb

## 2022-08-24 DIAGNOSIS — I25118 Atherosclerotic heart disease of native coronary artery with other forms of angina pectoris: Secondary | ICD-10-CM

## 2022-08-24 DIAGNOSIS — I509 Heart failure, unspecified: Secondary | ICD-10-CM

## 2022-08-24 DIAGNOSIS — I10 Essential (primary) hypertension: Secondary | ICD-10-CM

## 2022-08-24 DIAGNOSIS — I5022 Chronic systolic (congestive) heart failure: Secondary | ICD-10-CM

## 2022-08-24 DIAGNOSIS — E782 Mixed hyperlipidemia: Secondary | ICD-10-CM

## 2022-08-24 MED ORDER — ENTRESTO 24-26 MG PO TABS
1.0000 | ORAL_TABLET | Freq: Two times a day (BID) | ORAL | 3 refills | Status: DC
  Filled 2022-08-24: qty 60, 30d supply, fill #0
  Filled 2022-09-22: qty 60, 30d supply, fill #1

## 2022-08-24 MED FILL — Glucose Blood Test Strip: Qty: 100 | Fill #0 | Status: CN

## 2022-08-24 NOTE — Progress Notes (Signed)
Cardiology Office Note:    Date:  08/24/2022   ID:  Kathy [name], DOB September 10, 1965, MRN [id number]  PCP:  Rolm Gala, NP  [facility] Cardiologist:  Lorine Bears, MD  [facility] Electrophysiologist:  None   Referring MD: Rolm Gala, NP   Chief Complaint: 1 month follow-up  History of Present Illness:    Kathy [name] is a 57 y.o. female with a hx of chronic chest pain, left bundle branch block, chronic diastolic heart failure, mild nonobstructive CAD by multiple catheterizations, allergy to aspirin on long-term Plavix, previous tobacco use, obesity, hyperlipidemia who presents for 1 month follow-up.   Patient was hospitalized in March 2022 with acute on chronic diastolic heart failure in the setting of excessive sodium and fluid intake.  She improved with IV Lasix.  Echo showed EF of 50 to 55%.   Patient was seen in the office in early 2024 reporting worsening exertional dyspnea.  She had an echocardiogram which showed EF of 40 to 45%, mild pulmonary hypertension and mild to moderate MR.  He did not tolerate Jardiance due to itching.  She was seen 06/23/2022 reporting persistent dyspnea on exertion was set up for right and left heart cath.   Right and left heart cath 07/04/2022 showed mild left ventricular systolic dysfunction, LVEDP was normal, LVEF 50 to 55%, minimal nonobstructive CAD, right heart cath showed normal filling pressures, normal pulmonary pressure and normal cardiac output.  Patient was last seen July 24, 2022 reporting some weight gain.  Fluid pills have been decreased prior to the cath.  She reported baseline weight around 270lbs.  According to her scales weight was up 10 pounds. Right heart cath showed normal pressures. BMP was checked which normal. Spironolactone was increased.  Today, the patient reports chest pain and left arm pain. She saw [facility], who increased the torsemide to 60mg  daily. Breathing is overall OK. She says she may  have acid reflux, she felt bloated last week. She reports chest pain that is reproducible on exam.   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 02/2016 Echo: EF 45%; b. 03/2016 Echo: EF 55%; c. 12/2019 Echo: EF 55-60%, no rwma, gr2 DD, nl RV size/fxn, mildly dil RA, mild MR.   Asthma    Chronic chest pain    a. ? vasospasm vs endothelial dysfxn. Improved w/ Ranexa but couldn't afford.   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 Presumed in setting of prior h/o MI w/ insignificant coronary dzs.   Diabetes mellitus without complication (HCC)    Heart murmur    History of NICM (nonischemic cardiomyopathy) (HCC)    a. 02/2016 Echo: EF 45%; b. 12/2019 Echo: EF 55-60%.   Hyperlipidemia    Hypertension    Myocardial infarction [facility])    Non-obstructive Coronary artery disease    a. Prior caths in 2007, 2011, 2012 - nonobs dzs; b. 03/2016 Cath ([facility]): LM nl, LAD/LCX/RCA insignificant dzs.   Obesity    Rate-dependent LBBB (left bundle branch block)    Remote Tobacco abuse    a. 30+ pack year history - quit 2019.   Seizures (HCC)     Past Surgical History:  Procedure Laterality Date   ANTERIOR CRUCIATE LIGAMENT REPAIR     BACK SURGERY     L4 and L5   CARDIAC CATHETERIZATION  04/09/2016   COLONOSCOPY     COLONOSCOPY WITH PROPOFOL N/A 08/04/2019  Procedure: COLONOSCOPY WITH PROPOFOL;  Surgeon: Toney Reil, MD;  Location: [facility] ENDOSCOPY;  Service: Gastroenterology;  Laterality: N/A;   EMPYEMA DRAINAGE     at age 49   RIGHT/LEFT HEART CATH AND CORONARY ANGIOGRAPHY Bilateral 07/04/2022   Procedure: RIGHT/LEFT HEART CATH AND CORONARY ANGIOGRAPHY;  Surgeon: Iran Ouch, MD;  Location: [facility] INVASIVE CV LAB;  Service: Cardiovascular;  Laterality: Bilateral;   TONSILLECTOMY     TONSILLECTOMY AND ADENOIDECTOMY  2008   TUBAL LIGATION      Current Medications: Current Meds  Medication Sig   blood glucose meter kit and  supplies KIT Dispense based on patient and insurance preference. Use up to four times daily as directed. (FOR ICD-9 250.00, 250.01).   Blood Pressure Monitoring (BLOOD PRESSURE KIT) KIT 1 kit by Does not apply route daily as needed.   clopidogrel (PLAVIX) 75 MG tablet Take 1 tablet (75 mg total) by mouth once daily.   diphenhydrAMINE (BENADRYL) 50 MG tablet Take 50 mg by mouth 2 (two) times daily.   EPINEPHrine 0.3 mg/0.3 mL IJ SOAJ injection Inject 0.3 into the muscle as needed for anaphylaxis   glucose blood (RIGHTEST GS550 BLOOD GLUCOSE) test strip Use as directed to test blood sugar.   Insulin Pen Needle 32G X 6 MM MISC Use one pen needle with Ozempic once every week   isosorbide mononitrate (IMDUR) 60 MG 24 hr tablet TAKE 1 TABLET BY MOUTH TWICE DAILY.   metoprolol tartrate (LOPRESSOR) 25 MG tablet Take 1 tablet (25 mg total) by mouth 2 (two) times daily.   nitroGLYCERIN (NITROSTAT) 0.4 MG SL tablet 1 TABLET UNDER TONGUE AS NEEDED FOR CHEST PAIN EVERY 5 MINUTES FOR MAX OF 3 DOSES IN 15 MINUTES; IF NO RELIEF AFTER 1ST DOSE CALL 911   pantoprazole (PROTONIX) 40 MG tablet Take 1 tablet (40 mg total) by mouth once daily.   potassium chloride SA (KLOR-CON M) 20 MEQ tablet Take 1 tablet (20 mEq total) by mouth 2 (two) times daily.   ranolazine (RANEXA) 500 MG 12 hr tablet Take 1 tablet (500 mg total) by mouth 2 (two) times daily.   Rightest GL300 Lancets MISC Use as directed to test blood sugar.   sacubitril-valsartan (ENTRESTO) 24-26 MG Take 1 tablet by mouth 2 (two) times daily.   Semaglutide, 1 MG/DOSE, (OZEMPIC, 1 MG/DOSE,) 4 MG/3ML SOPN Inject 1 mg into the skin once a week. Instead of Trulicity (Patient taking differently: Inject 1 mg into the skin once a week. Instead of Trulicity. On Wednesdays 1800)   simvastatin (ZOCOR) 20 MG tablet Take 1 tablet (20 mg total) by mouth once daily.   spironolactone (ALDACTONE) 25 MG tablet Take 1 tablet (25 mg total) by mouth daily.   torsemide (DEMADEX) 20  MG tablet Take 3 tablets (60 mg total) by mouth daily.     Allergies:   Aspirin, Beef allergy, Celebrex [celecoxib], Fentanyl, Fluticasone propionate, Gabapentin, Hydrocodone-acetaminophen, Latex, Lisinopril, Morphine, Oxycodone-acetaminophen, Percocet [oxycodone-acetaminophen], Pork allergy, Simvastatin, Skelaxin [metaxalone], Beef-derived products, Pork-derived products, Vicodin [hydrocodone-acetaminophen], Albuterol, and Jardiance [empagliflozin]   Social History   Socioeconomic History   Marital status: Divorced    Spouse name: Not on file   Number of children: 2   Years of education: pre-requisites    Highest education level: GED or equivalent  Occupational History   Occupation: unemployed  Tobacco Use   Smoking status: Former    Packs/day: 1.00    Years: 30.00    Additional pack years: 0.00    Total pack  years: 30.00    Types: Cigarettes    Quit date: 08/16/2017    Years since quitting: 5.0   Smokeless tobacco: Never  Vaping Use   Vaping Use: Never used  Substance and Sexual Activity   Alcohol use: Not Currently    Comment: last use 01/2022   Drug use: Not Currently    Comment: Previously used cocaine in the 1990s   Sexual activity: Yes    Birth control/protection: Condom  Other Topics Concern   Not on file  Social History Narrative   Currently on food stamps. They help somewhat but still difficult to buy food that she needs. Boyfriend helps pay rent with disability, they live together. Roof is leaking really badly. Lives in modular home. Social services bought tarp which helped, but it deteriorated from heat.    Social Determinants of Health   Financial Resource Strain: High Risk (02/12/2018)   Overall Financial Resource Strain (CARDIA)    Difficulty of Paying Living Expenses: Hard  Food Insecurity: No Food Insecurity (10/20/2021)   Hunger Vital Sign    Worried About Running Out of Food in the Last Year: Never true    Ran Out of Food in the Last Year: Never true   Transportation Needs: No Transportation Needs (10/20/2021)   PRAPARE - Administrator, Civil Service (Medical): No    Lack of Transportation (Non-Medical): No  Physical Activity: Sufficiently Active (11/06/2017)   Exercise Vital Sign    Days of Exercise per Week: 2 days    Minutes of Exercise per Session: 100 min  Stress: Stress Concern Present (11/06/2017)   [employer] of Occupational Health - Occupational Stress Questionnaire    Feeling of Stress : To some extent  Social Connections: Moderately Isolated (11/06/2017)   Social Connection and Isolation Panel [NHANES]    Frequency of Communication with Friends and Family: More than three times a week    Frequency of Social Gatherings with Friends and Family: Once a week    Attends Religious Services: Never    Database administrator or Organizations: No    Attends Engineer, structural: Never    Marital Status: Divorced     Family History: The patient's family history includes Asthma in her daughter and son; COPD in her sister; Colon cancer in her maternal grandfather and mother; Diabetes in her mother and sister; Heart attack in her father and mother; Heart disease in her maternal grandmother and paternal grandfather; Hyperlipidemia in her maternal grandfather and paternal grandfather; Hypertension in her daughter, maternal grandfather, maternal grandmother, and paternal grandfather; Kidney disease in her sister; Migraines in her sister; Stroke in her father, mother, paternal grandmother, and sister; Thyroid disease in her daughter, mother, sister, and sister.  ROS:   Please see the history of present illness.     All other systems reviewed and are negative.  EKGs/Labs/Other Studies Reviewed:    The following studies were reviewed today:  Right and left cardiac cath 06/2022     There is mild left ventricular systolic dysfunction.   LV end diastolic pressure is normal.   The left ventricular ejection fraction  is 50-55% by visual estimate.   1.  Minimal nonobstructive coronary artery disease. 2.  Mildly reduced LV systolic function with an EF of 50%. 3.  Right heart catheterization showed normal filling pressures, normal pulmonary pressure and normal cardiac output.   Recommendations: Continue medical therapy. No evidence of volume overload.  I decreased the dose of torsemide to  40 mg daily.    Echo 05/2022 1. Left ventricular ejection fraction, by estimation, is 40 to 45%. Left  ventricular ejection fraction by PLAX is 41 %. The left ventricle has  mildly decreased function. The left ventricle demonstrates global  hypokinesis. Left ventricular diastolic  parameters are consistent with Grade I diastolic dysfunction (impaired  relaxation). The average left ventricular global longitudinal strain is  -13.6 %.   2. Right ventricular systolic function is normal. The right ventricular  size is normal. There is normal pulmonary artery systolic pressure. The  estimated right ventricular systolic pressure is 33.3 mmHg.   3. The mitral valve is normal in structure. Mild to moderate mitral valve  regurgitation. No evidence of mitral stenosis.   4. The aortic valve has an indeterminant number of cusps. Aortic valve  regurgitation is not visualized. No aortic stenosis is present.   5. The inferior vena cava is normal in size with greater than 50%  respiratory variability, suggesting right atrial pressure of 3 mmHg.   Comparison(s): Previous LVEF reported as 50-55%.   EKG:  EKG is  ordered today.  The ekg ordered today demonstrates NSR, LBBB, 75bpm, no changes  Recent Labs: 01/18/2022: ALT 22 06/23/2022: Platelets 282 07/04/2022: Hemoglobin 13.6 07/24/2022: B Natriuretic Peptide 21.9 08/09/2022: BUN 13; Creatinine, Ser 0.93; Potassium 4.4; Sodium 138  Recent Lipid Panel     Physical Exam:    VS:  BP (!) 120/90 (BP Location: Left Arm, Patient Position: Sitting, Cuff Size: Large)   Pulse 75   Ht  5\' 7"  (1.702 m)   Wt 275 lb 8 oz (125 kg)   LMP 06/09/2018   SpO2 98%   BMI 43.15 kg/m     Wt Readings from Last 3 Encounters:  08/24/22 275 lb 8 oz (125 kg)  08/10/22 281 lb (127.5 kg)  07/26/22 281 lb 8 oz (127.7 kg)     GEN:  Well nourished, well developed in no acute distress HEENT: Normal NECK: No JVD; No carotid bruits LYMPHATICS: No lymphadenopathy CARDIAC: RRR, no murmurs, rubs, gallops RESPIRATORY:  Clear to auscultation without rales, wheezing or rhonchi  ABDOMEN: Soft, non-tender, non-distended MUSCULOSKELETAL:  No edema; No deformity  SKIN: Warm and dry NEUROLOGIC:  Alert and oriented x 3 PSYCHIATRIC:  Normal affect   ASSESSMENT:    1. Coronary artery disease involving native coronary artery of native heart with other form of angina pectoris (HCC)   2. Congestive heart failure, unspecified HF chronicity, unspecified heart failure type (HCC)   3. Chronic systolic heart failure (HCC)   4. Hyperlipidemia, mixed   5. Essential hypertension    PLAN:    In order of problems listed above:  Chest pain Mild CAD with chronic angina Patient reports atypical angina. Chest pain reproducible with palpation. Heart cath in 06/2022 showed no significant CAD. She has an allergy to Aspirin. Continue Plavix 75mg  daily, Imdur 60mg  daily, Lopressor 25mg  BID, Ranexa 500mg  BID and SL NTG. No further ischemic work-up at this time.   Chronic systolic heart failure LVEF 40-45%. Right Heart cath 06/2022 showed normal pressures and Torsemide was decreased to 40mg  daily. She reports she saw [facility], who increase Torsemide  back to 60mg  daily. She is on Spironolactone 25mg  daily. She did not tolerate Jardiance. I will start Entresto 24-26mg  BID BMET in 2 weeks. I can re-check echo at follow-up. She sees Clarisa Kindred in June 2023. Patient may not need increased Torsemide dose.   HLD LDL 93. Continue simvastatin 20mg  daily.  HTN BP is good, monitor BP with addition of Entresto.    Disposition: Follow up in 2 month(s) with MD/APP   Signed, Anivea Velasques David Stall, PA-C  08/24/2022 4:20 PM    [facility]

## 2022-08-24 NOTE — Patient Instructions (Signed)
Medication Instructions:  Your physician has recommended you make the following change in your medication:   START - sacubitril-valsartan (ENTRESTO) 24-26 MG - Take 1 tablet by mouth 2 (two) times daily.  *If you need a refill on your cardiac medications before your next appointment, please call your pharmacy*  Lab Work: Your physician recommends that you return for lab work in 2 weeks: Press photographer at [facility] 1st desk on the right to check in (REGISTRATION)  Lab hours: Monday- Friday (7:30 am- 5:30 pm)   If you have labs (blood work) drawn today and your tests are completely normal, you will receive your results only by: MyChart Message (if you have MyChart) OR A paper copy in the mail If you have any lab test that is abnormal or we need to change your treatment, we will call you to review the results.  Testing/Procedures: -None ordered  Follow-Up: At [facility], you and your health needs are our priority.  As part of our continuing mission to provide you with exceptional heart care, we have created designated Provider Care Teams.  These Care Teams include your primary Cardiologist (physician) and Advanced Practice Providers (APPs -  Physician Assistants and Nurse Practitioners) who all work together to provide you with the care you need, when you need it.  We recommend signing up for the patient portal called "MyChart".  Sign up information is provided on this After Visit Summary.  MyChart is used to connect with patients for Virtual Visits (Telemedicine).  Patients are able to view lab/test results, encounter notes, upcoming appointments, etc.  Non-urgent messages can be sent to your provider as well.   To learn more about what you can do with MyChart, go to [email or web address].    Your next appointment:   6 - 8 week(s)  Provider:   Terrilee Croak, PA-C    Other Instructions -None

## 2022-08-25 ENCOUNTER — Other Ambulatory Visit: Payer: Self-pay

## 2022-09-06 ENCOUNTER — Other Ambulatory Visit: Payer: Self-pay

## 2022-09-11 ENCOUNTER — Other Ambulatory Visit: Payer: Self-pay

## 2022-09-15 ENCOUNTER — Other Ambulatory Visit: Payer: Self-pay

## 2022-09-18 ENCOUNTER — Other Ambulatory Visit: Payer: Self-pay

## 2022-09-19 ENCOUNTER — Other Ambulatory Visit: Payer: Self-pay

## 2022-09-22 ENCOUNTER — Other Ambulatory Visit: Payer: Self-pay

## 2022-09-25 ENCOUNTER — Other Ambulatory Visit: Payer: Self-pay

## 2022-09-26 ENCOUNTER — Other Ambulatory Visit: Payer: Self-pay

## 2022-09-26 ENCOUNTER — Telehealth: Payer: Self-pay | Admitting: Medical

## 2022-09-26 NOTE — Telephone Encounter (Signed)
Patient calling the office for samples of medication:   1.  What medication and dosage are you requesting samples for? Entresto  2.  Are you currently out of this medication?  Yes, waiting for her mail order to come- been out since Sunday- need some today pleaset

## 2022-09-26 NOTE — Telephone Encounter (Signed)
Medication Samples have been provided to the patient.  Drug name: Entrest       Strength: 24/26 mg        Qty: 1  LOT: [id number] Exp.Date: 10/2023  Dosing instructions: take one tablet twice a day  The patient has been instructed regarding the correct time, dose, and frequency of taking this medication, including desired effects and most common side effects.   Patient notified to come for blood work since she missed the appointment for BMET 2 weeks after starting the Entresto 24/26 mg. The patient stated, she forgot to come. She will come tomorrow, 09/27/2022 for the lab work & pick up samples. The patient has been without Entresto 24/26 mg for 3 days.  The patient was instructed to have lab work done at [facility].  Lynden Oxford Crusan 4:49 PM 09/26/2022

## 2022-09-27 ENCOUNTER — Other Ambulatory Visit
Admission: RE | Admit: 2022-09-27 | Discharge: 2022-09-27 | Disposition: A | Discharge status: Home / Self Care | Payer: Medicaid Other | Source: Ambulatory Visit | Attending: Medical | Admitting: Medical

## 2022-09-27 ENCOUNTER — Other Ambulatory Visit: Payer: Self-pay

## 2022-09-27 DIAGNOSIS — I25118 Atherosclerotic heart disease of native coronary artery with other forms of angina pectoris: Secondary | ICD-10-CM | POA: Diagnosis present

## 2022-09-27 DIAGNOSIS — I5022 Chronic systolic (congestive) heart failure: Secondary | ICD-10-CM | POA: Insufficient documentation

## 2022-09-27 DIAGNOSIS — I509 Heart failure, unspecified: Secondary | ICD-10-CM | POA: Insufficient documentation

## 2022-09-27 LAB — BASIC METABOLIC PANEL
Anion gap: 12 (ref 5–15)
BUN: 14 mg/dL (ref 6–20)
CO2: 26 mmol/L (ref 22–32)
Calcium: 9 mg/dL (ref 8.9–10.3)
Chloride: 100 mmol/L (ref 98–111)
Creatinine, Ser: 0.97 mg/dL (ref 0.44–1.00)
GFR, Estimated: >60 mL/min (ref >60)
Glucose, Bld: 106 mg/dL — ABNORMAL HIGH (ref 70–99)
Potassium: 3.7 mmol/L (ref 3.5–5.1)
Sodium: 138 mmol/L (ref 135–145)

## 2022-10-03 ENCOUNTER — Encounter: Payer: Self-pay | Admitting: Family

## 2022-10-03 ENCOUNTER — Ambulatory Visit: Payer: Medicaid Other | Attending: Family | Admitting: Family

## 2022-10-03 VITALS — BP 82/42 | HR 66 | Ht 67.0 in | Wt 280.0 lb

## 2022-10-03 DIAGNOSIS — J45909 Unspecified asthma, uncomplicated: Secondary | ICD-10-CM | POA: Diagnosis not present

## 2022-10-03 DIAGNOSIS — E119 Type 2 diabetes mellitus without complications: Secondary | ICD-10-CM | POA: Insufficient documentation

## 2022-10-03 DIAGNOSIS — Z794 Long term (current) use of insulin: Secondary | ICD-10-CM | POA: Diagnosis not present

## 2022-10-03 DIAGNOSIS — E669 Obesity, unspecified: Secondary | ICD-10-CM | POA: Insufficient documentation

## 2022-10-03 DIAGNOSIS — Z79899 Other long term (current) drug therapy: Secondary | ICD-10-CM | POA: Insufficient documentation

## 2022-10-03 DIAGNOSIS — Z8249 Family history of ischemic heart disease and other diseases of the circulatory system: Secondary | ICD-10-CM | POA: Insufficient documentation

## 2022-10-03 DIAGNOSIS — Z7985 Long-term (current) use of injectable non-insulin antidiabetic drugs: Secondary | ICD-10-CM | POA: Diagnosis not present

## 2022-10-03 DIAGNOSIS — Z7902 Long term (current) use of antithrombotics/antiplatelets: Secondary | ICD-10-CM | POA: Diagnosis not present

## 2022-10-03 DIAGNOSIS — Z5986 Financial insecurity: Secondary | ICD-10-CM | POA: Insufficient documentation

## 2022-10-03 DIAGNOSIS — I5032 Chronic diastolic (congestive) heart failure: Secondary | ICD-10-CM | POA: Insufficient documentation

## 2022-10-03 DIAGNOSIS — Z87891 Personal history of nicotine dependence: Secondary | ICD-10-CM | POA: Insufficient documentation

## 2022-10-03 DIAGNOSIS — I2510 Atherosclerotic heart disease of native coronary artery without angina pectoris: Secondary | ICD-10-CM | POA: Insufficient documentation

## 2022-10-03 DIAGNOSIS — Z833 Family history of diabetes mellitus: Secondary | ICD-10-CM | POA: Diagnosis not present

## 2022-10-03 DIAGNOSIS — E785 Hyperlipidemia, unspecified: Secondary | ICD-10-CM | POA: Diagnosis not present

## 2022-10-03 DIAGNOSIS — I5022 Chronic systolic (congestive) heart failure: Secondary | ICD-10-CM | POA: Diagnosis not present

## 2022-10-03 DIAGNOSIS — I10 Essential (primary) hypertension: Secondary | ICD-10-CM | POA: Diagnosis not present

## 2022-10-03 DIAGNOSIS — I110 Hypertensive heart disease with heart failure: Secondary | ICD-10-CM | POA: Insufficient documentation

## 2022-10-03 NOTE — Patient Instructions (Signed)
Decrease your isosorbide to 60mg  to once daily due to your low blood pressure.

## 2022-10-03 NOTE — Progress Notes (Signed)
PCP: Rolm Gala, NP (last seen 04/24) Primary Cardiologist: Lorine Bears, MD (last seen 05/24)  HPI:  Kathy [name] is a 57 y/o female with a history of asthma, CAD (mild nonobstructive), chronic chest pain, hyperlipidemia, HTN, seizures, obesity, DM, previous tobacco use and chronic heart failure.   Echo 06/08/22: EF 40-45% along with mild/moderate MR. Echo 06/14/20: EF of 50-55% along with mild LVH. Echo 01/06/20: EF of 55-60% along with mild LVH and mild MR. Echo 02/27/17: EF of 55-60%.  RHC/LHC 07/04/22:   There is mild left ventricular systolic dysfunction.   LV end diastolic pressure is normal.   The left ventricular ejection fraction is 50-55% by visual estimate.  1.  Minimal nonobstructive coronary artery disease. 2.  Mildly reduced LV systolic function with an EF of 50%. 3.  Right heart catheterization showed normal filling pressures, normal pulmonary pressure and normal cardiac output.  Has not been admitted or been in the ED in the last 6 months.   She presents today for a HF follow up visit with a chief complaint of minimal SOB with moderate exertion. Chronic in nature. Has occasional chest "pinch" with walking and resolves on its own. Occasional palpitations, dizziness with vertigo and difficulty sleeping due to shoulder/ knee pain. Denies chest pain, cough, pedal edema or abdominal distention.   Has not had anything to eat or drink yet today (it's 3pm) and says that she just doesn't feel hungry. Has been started on entresto since here last and today her BP is low even after rechecking with manual cuff. She denies any unusual dizziness and says that when she does feel dizzy, it feels like her vertigo.   ROS: All systems negative except as listed in HPI, PMH and Problem List.  SH:  Social History   Socioeconomic History   Marital status: Divorced    Spouse name: Not on file   Number of children: 2   Years of education: pre-requisites    Highest education level: GED or  equivalent  Occupational History   Occupation: unemployed  Tobacco Use   Smoking status: Former    Packs/day: 1.00    Years: 30.00    Additional pack years: 0.00    Total pack years: 30.00    Types: Cigarettes    Quit date: 08/16/2017    Years since quitting: 5.1   Smokeless tobacco: Never  Vaping Use   Vaping Use: Never used  Substance and Sexual Activity   Alcohol use: Not Currently    Comment: last use 01/2022   Drug use: Not Currently    Comment: Previously used cocaine in the 1990s   Sexual activity: Yes    Birth control/protection: Condom  Other Topics Concern   Not on file  Social History Narrative   Currently on food stamps. They help somewhat but still difficult to buy food that she needs. Boyfriend helps pay rent with disability, they live together. Roof is leaking really badly. Lives in modular home. Social services bought tarp which helped, but it deteriorated from heat.    Social Determinants of Health   Financial Resource Strain: High Risk (02/12/2018)   Overall Financial Resource Strain (CARDIA)    Difficulty of Paying Living Expenses: Hard  Food Insecurity: No Food Insecurity (10/20/2021)   Hunger Vital Sign    Worried About Running Out of Food in the Last Year: Never true    Ran Out of Food in the Last Year: Never true  Transportation Needs: No Transportation Needs (10/20/2021)  PRAPARE - Administrator, Civil Service (Medical): No    Lack of Transportation (Non-Medical): No  Physical Activity: Sufficiently Active (11/06/2017)   Exercise Vital Sign    Days of Exercise per Week: 2 days    Minutes of Exercise per Session: 100 min  Stress: Stress Concern Present (11/06/2017)   [employer] of Occupational Health - Occupational Stress Questionnaire    Feeling of Stress : To some extent  Social Connections: Moderately Isolated (11/06/2017)   Social Connection and Isolation Panel [NHANES]    Frequency of Communication with Friends and Family: More  than three times a week    Frequency of Social Gatherings with Friends and Family: Once a week    Attends Religious Services: Never    Database administrator or Organizations: No    Attends Banker Meetings: Never    Marital Status: Divorced  Catering manager Violence: Not At Risk (11/06/2017)   Humiliation, Afraid, Rape, and Kick questionnaire    Fear of Current or Ex-Partner: No    Emotionally Abused: No    Physically Abused: No    Sexually Abused: No    FH:  Family History  Problem Relation Age of Onset   Kidney disease Sister    Diabetes Sister    COPD Sister    Stroke Sister    Thyroid disease Sister    Diabetes Mother    Stroke Mother    Heart attack Mother    Thyroid disease Mother    Colon cancer Mother    Stroke Father    Heart attack Father    Asthma Daughter    Hypertension Daughter    Thyroid disease Daughter    Asthma Son    Heart disease Maternal Grandmother    Hypertension Maternal Grandmother    Colon cancer Maternal Grandfather    Hypertension Maternal Grandfather    Hyperlipidemia Maternal Grandfather    Stroke Paternal Grandmother    Hypertension Paternal Grandfather    Hyperlipidemia Paternal Grandfather    Heart disease Paternal Grandfather    Thyroid disease Sister    Migraines Sister     Past Medical History:  Diagnosis Date   (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 02/2016 Echo: EF 45%; b. 03/2016 Echo: EF 55%; c. 12/2019 Echo: EF 55-60%, no rwma, gr2 DD, nl RV size/fxn, mildly dil RA, mild MR.   Asthma    Chronic chest pain    a. ? vasospasm vs endothelial dysfxn. Improved w/ Ranexa but couldn't afford.   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 Presumed in setting of prior h/o MI w/ insignificant coronary dzs.   Diabetes mellitus without complication (HCC)    Heart murmur    History of NICM (nonischemic cardiomyopathy) (HCC)    a. 02/2016 Echo: EF 45%; b. 12/2019 Echo: EF 55-60%.   Hyperlipidemia     Hypertension    Myocardial infarction [facility])    Non-obstructive Coronary artery disease    a. Prior caths in 2007, 2011, 2012 - nonobs dzs; b. 03/2016 Cath ([facility]): LM nl, LAD/LCX/RCA insignificant dzs.   Obesity    Rate-dependent LBBB (left bundle branch block)    Remote Tobacco abuse    a. 30+ pack year history - quit 2019.   Seizures (HCC)     Current Outpatient Medications  Medication Sig Dispense Refill   blood glucose meter kit and supplies KIT Dispense based on patient and insurance preference. Use up to four  times daily as directed. (FOR ICD-9 250.00, 250.01). 1 each 0   Blood Pressure Monitoring (BLOOD PRESSURE KIT) KIT 1 kit by Does not apply route daily as needed. 1 kit 0   clopidogrel (PLAVIX) 75 MG tablet Take 1 tablet (75 mg total) by mouth once daily. 90 tablet 3   diphenhydrAMINE (BENADRYL) 50 MG tablet Take 50 mg by mouth 2 (two) times daily.     EPINEPHrine 0.3 mg/0.3 mL IJ SOAJ injection Inject 0.3 into the muscle as needed for anaphylaxis 4 each 6   glucose blood (RIGHTEST GS550 BLOOD GLUCOSE) test strip Use as directed to test blood sugar. 100 each 11   Insulin Pen Needle 32G X 6 MM MISC Use one pen needle with Ozempic once every week 100 each 3   isosorbide mononitrate (IMDUR) 60 MG 24 hr tablet TAKE 1 TABLET BY MOUTH TWICE DAILY. 180 tablet 3   metoprolol tartrate (LOPRESSOR) 25 MG tablet Take 1 tablet (25 mg total) by mouth 2 (two) times daily. 180 tablet 3   nitroGLYCERIN (NITROSTAT) 0.4 MG SL tablet 1 TABLET UNDER TONGUE AS NEEDED FOR CHEST PAIN EVERY 5 MINUTES FOR MAX OF 3 DOSES IN 15 MINUTES; IF NO RELIEF AFTER 1ST DOSE CALL 911 25 tablet 0   pantoprazole (PROTONIX) 40 MG tablet Take 1 tablet (40 mg total) by mouth once daily. 90 tablet 1   potassium chloride SA (KLOR-CON M) 20 MEQ tablet Take 1 tablet (20 mEq total) by mouth 2 (two) times daily. 180 tablet 3   ranolazine (RANEXA) 500 MG 12 hr tablet Take 1 tablet (500 mg total) by mouth 2 (two) times daily. 180  tablet 3   Rightest GL300 Lancets MISC Use as directed to test blood sugar. 100 each 11   sacubitril-valsartan (ENTRESTO) 24-26 MG Take 1 tablet by mouth 2 (two) times daily. 180 tablet 3   Semaglutide, 1 MG/DOSE, (OZEMPIC, 1 MG/DOSE,) 4 MG/3ML SOPN Inject 1 mg into the skin once a week. Instead of Trulicity (Patient taking differently: Inject 1 mg into the skin once a week. Instead of Trulicity. On Wednesdays 1800) 3 mL 6   simvastatin (ZOCOR) 20 MG tablet Take 1 tablet (20 mg total) by mouth once daily. 90 tablet 1   spironolactone (ALDACTONE) 25 MG tablet Take 1 tablet (25 mg total) by mouth daily. 90 tablet 3   torsemide (DEMADEX) 20 MG tablet Take 3 tablets (60 mg total) by mouth daily. 270 tablet 3   No current facility-administered medications for this visit.   Vitals:   10/03/22 1500  BP: (!) 82/42  Pulse: 66  SpO2: 97%  Weight: 280 lb (127 kg)  Height: 5\' 7"  (1.702 m)   Wt Readings from Last 3 Encounters:  10/03/22 280 lb (127 kg)  08/24/22 275 lb 8 oz (125 kg)  08/10/22 281 lb (127.5 kg)   Lab Results  Component Value Date   CREATININE 0.97 09/27/2022   CREATININE 0.93 08/09/2022   CREATININE 0.87 06/23/2022   PHYSICAL EXAM:  General:  Well appearing. No resp difficulty HEENT: normal Neck: supple. JVP flat. No lymphadenopathy or thryomegaly appreciated. Cor: PMI normal. Regular rate & rhythm. No rubs, gallops or murmurs. Lungs: clear Abdomen: soft, nontender, nondistended. No hepatosplenomegaly. No bruits or masses.  Extremities: no cyanosis, clubbing, rash, edema Neuro: alert & oriented x3, cranial nerves grossly intact. Moves all 4 extremities w/o difficulty. Affect pleasant.   ECG: 08/24/22 @ cardiology office showed NSR w/ LBBB   ASSESSMENT & PLAN:  1:  Chronic heart failure with mildly reduced ejection fraction with LVH- - etiology likely LBBB as cath showed nonobstructive CAD - NYHA class II - euvolemic today  - weighing daily; reminded to call for an  overnight weight gain of >2 pounds or a weekly weight gain of >5 pounds - weight stable from last visit here 2 months ago - Echo 02/27/17: EF of 55-60%. - Echo 01/06/20: EF of 55-60% along with mild LVH and mild MR - Echo 06/14/20: EF of 50-55% along with mild LVH.  - echo 06/08/22: EF 40-45% along with mild/moderate MR.  - has been following a daily 2L fluid intake along with closely following a 2000mg  sodium diet - continue spironolactone 25mg  daily - continue torsemide to 60mg  daily/ potassium BID - continue entresto 24/26mg  BID - jardiance caused HA, nausea and increased thirst so this was stopped - BNP 06/12/20 was 45.8  2: HTN- - BP 82/42; given water and she drank ~ 4 ounces; declined peanut butter/ crackers; rechecked BP manually and it was 90/42 - decrease isosorbide to 60mg  daily (instead of BID); if chest pinch/tightness worsens, she can resume isosorbide BID - if BP remains low, would need to consider decreasing spironolactone to try and keep patient on entresto if possible; was on lower dose of torsemide previously but fluid retention worsened so she's hesitant to reduce torsemide - saw PCP (llobachie NP) 04/24 - BMP 09/27/22 reviewed and showed sodium 138, potassium 3.7, creatinine 0.97 and GFR >60  3: DM- - A1c 07/26/22 was 5.6% - ozempic 1mg  weekly  4: Nonobstructive CAD- - saw cardiology Fransico Michael) 05/24 - RHC/LHC 07/04/22:   There is mild left ventricular systolic dysfunction.   LV end diastolic pressure is normal.   The left ventricular ejection fraction is 50-55% by visual estimate.  1.  Minimal nonobstructive coronary artery disease. 2.  Mildly reduced LV systolic function with an EF of 50%. 3.  Right heart catheterization showed normal filling pressures, normal pulmonary pressure and normal cardiac output. - continue plavix 75mg  daily - reduce isosorbide MN 60mg  daily per above due to hypotension - continue metoprolol tartrate 25mg  BID - continue ranexa 500mg   BID  Return in 1 month, sooner if needed.

## 2022-10-03 NOTE — Progress Notes (Signed)
[facility] HEART FAILURE CLINIC - Pharmacist Note  Kathy [name] is a 57 y.o. female with HFmrEF (EF 41-49%) presenting to the Heart Failure Clinic for follow up. Patient reports doing well since starting Entresto about 1 month ago. Her BPs at home have been 90s/50s. She states that she has been watching her fluid and sodium intake to adhere to 2L fluid restriction and 2g sodium restriction. She states that her weight has been stable. She would like to titrate up on her Ozempic to help her lose another 25 lbs as she was directed to do so by her orthopedist.   Recent ED Visit (past 6 months): none  Guideline-Directed Medical Therapy/Evidence Based Medicine ACE/ARB/ARNI: Sacubitril/valsartan 24/26 mg twice daily Beta Blocker: Metoprolol tartrate 25 mg twice daily Aldosterone Antagonist: Spironolactone 25 mg daily Diuretic: Torsemide 60 mg daily SGLT2i:  none, ADR to empagliflozin  Adherence Assessment Do you ever forget to take your medication? [] Yes [x] No  Do you ever skip doses due to side effects? [] Yes [x] No  Do you have trouble affording your medicines? [] Yes [x] No  Are you ever unable to pick up your medication due to transportation difficulties? [] Yes [x] No  Do you ever stop taking your medications because you don't believe they are helping? [] Yes [x] No  Do you check your weight daily? [x] Yes [] No  Adherence strategy: Pill box Barriers to obtaining medications: none reported   Diagnostics ECHO: Date 06/08/2022, EF 40-45%, GHK, G1DD Cath: Date 07/04/2022, EF 50-55%, minimal nonobstructive CAD, normal filling pressures, no evidence of volume overload  Vitals    08/24/2022    3:34 PM 08/10/2022    3:11 PM 07/26/2022    2:16 PM  Vitals with BMI  Height 5\' 7"   5\' 7"   Weight 275 lbs 8 oz 281 lbs 281 lbs 8 oz  BMI 43.14 44 44.08  Systolic 120 142 644  Diastolic 90 90 64  Pulse 75 72 70     Recent Labs    Latest Ref Rng & Units 09/27/2022    1:44 PM 08/09/2022   12:54 PM 07/04/2022    11:20 AM  BMP  Glucose 70 - 99 mg/dL 034  742    BUN 6 - 20 mg/dL 14  13    Creatinine 5.95 - 1.00 mg/dL 6.38  7.56    Sodium 433 - 145 mmol/L 138  138  139   Potassium 3.5 - 5.1 mmol/L 3.7  4.4  3.5   Chloride 98 - 111 mmol/L 100  101    CO2 22 - 32 mmol/L 26  27    Calcium 8.9 - 10.3 mg/dL 9.0  9.0      Past Medical History Past Medical History:  Diagnosis Date   (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 02/2016 Echo: EF 45%; b. 03/2016 Echo: EF 55%; c. 12/2019 Echo: EF 55-60%, no rwma, gr2 DD, nl RV size/fxn, mildly dil RA, mild MR.   Asthma    Chronic chest pain    a. ? vasospasm vs endothelial dysfxn. Improved w/ Ranexa but couldn't afford.   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 Presumed in setting of prior h/o MI w/ insignificant coronary dzs.   Diabetes mellitus without complication (HCC)    Heart murmur    History of NICM (nonischemic cardiomyopathy) (HCC)    a. 02/2016 Echo: EF 45%; b. 12/2019 Echo: EF 55-60%.   Hyperlipidemia    Hypertension    Myocardial infarction [facility])    Non-obstructive Coronary  artery disease    a. Prior caths in 2007, 2011, 2012 - nonobs dzs; b. 03/2016 Cath ([facility]): LM nl, LAD/LCX/RCA insignificant dzs.   Obesity    Rate-dependent LBBB (left bundle branch block)    Remote Tobacco abuse    a. 30+ pack year history - quit 2019.   Seizures (HCC)     Plan Continue regimen as directed by NP Reduce Imdur to 30 mg daily in the setting of hypotension with new Entresto Annual echo due 05/2023  Time spent: 10 minutes  Celene Squibb, PharmD Clinical Pharmacist 10/03/2022 3:00 PM

## 2022-10-11 ENCOUNTER — Ambulatory Visit: Payer: Medicaid Other | Attending: Medical | Admitting: Medical

## 2022-10-11 ENCOUNTER — Encounter: Payer: Self-pay | Admitting: Medical

## 2022-10-11 VITALS — BP 100/70 | HR 69 | Ht 67.0 in | Wt 280.0 lb

## 2022-10-11 DIAGNOSIS — I5022 Chronic systolic (congestive) heart failure: Secondary | ICD-10-CM

## 2022-10-11 DIAGNOSIS — I25118 Atherosclerotic heart disease of native coronary artery with other forms of angina pectoris: Secondary | ICD-10-CM

## 2022-10-11 DIAGNOSIS — E782 Mixed hyperlipidemia: Secondary | ICD-10-CM

## 2022-10-11 DIAGNOSIS — I10 Essential (primary) hypertension: Secondary | ICD-10-CM

## 2022-10-11 NOTE — Patient Instructions (Signed)
Medication Instructions:  Your physician recommends that you continue on your current medications as directed. Please refer to the Current Medication list given to you today.  *If you need a refill on your cardiac medications before your next appointment, please call your pharmacy*  Lab Work: -None ordered  Testing/Procedures: -None ordered  Follow-Up: At [facility], you and your health needs are our priority.  As part of our continuing mission to provide you with exceptional heart care, we have created designated Provider Care Teams.  These Care Teams include your primary Cardiologist (physician) and Advanced Practice Providers (APPs -  Physician Assistants and Nurse Practitioners) who all work together to provide you with the care you need, when you need it.  We recommend signing up for the patient portal called "MyChart".  Sign up information is provided on this After Visit Summary.  MyChart is used to connect with patients for Virtual Visits (Telemedicine).  Patients are able to view lab/test results, encounter notes, upcoming appointments, etc.  Non-urgent messages can be sent to your provider as well.   To learn more about what you can do with MyChart, go to [email or web address].    Your next appointment:   4 month(s)  Provider:   You may see Lorine Bears, MD or one of the following Advanced Practice Providers on your designated Care Team:   Nicolasa Ducking, NP Eula Listen, PA-C Cadence Fransico Michael, PA-C Charlsie Quest, NP    Other Instructions -None

## 2022-10-11 NOTE — Progress Notes (Signed)
Cardiology Office Note:    Date:  10/11/2022   ID:  Kathy [name], DOB 1965/08/02, MRN [id number]  PCP:  Rolm Gala, NP  [facility] Cardiologist:  Lorine Bears, MD  [facility] Electrophysiologist:  None   Referring MD: Rolm Gala, NP   Chief Complaint: 2 month follow-up  History of Present Illness:    Kathy [name] is a 57 y.o. female with a hx of  chronic chest pain, left bundle branch block, chronic diastolic heart failure, mild nonobstructive CAD by multiple catheterizations, allergy to aspirin on long-term Plavix, previous tobacco use, obesity, hyperlipidemia who presents for 2 month follow-up.   Patient was hospitalized in March 2022 with acute on chronic diastolic heart failure in the setting of excessive sodium and fluid intake.  She improved with IV Lasix.  Echo showed EF of 50 to 55%.   Patient was seen in the office in early 2024 reporting worsening exertional dyspnea.  She had an echocardiogram which showed EF of 40 to 45%, mild pulmonary hypertension and mild to moderate MR.  She did not tolerate Jardiance due to itching.  She was seen 06/23/2022 reporting persistent dyspnea on exertion was set up for right and left heart cath.   Right and left heart cath 07/04/2022 showed mild left ventricular systolic dysfunction, LVEDP was normal, LVEF 50 to 55%, minimal nonobstructive CAD, right heart cath showed normal filling pressures, normal pulmonary pressure and normal cardiac output. Torsemide was decreased to 40mg  daily. The patient subsequently saw CHF clinic and was volume overloaded. Torsemide was increased back to 60mg  daily,   She saw Clarisa [facility] 6/25 and BP was very low and Imdur was decreased.   Today, BP is better, but still soft. She is having trouble sleeping, but doesn't feel like she needs a pill to help with this. She denies chest pain, SOB, lower leg edema, orthopnea or pnd. [facility] She has  a lot of primary care medicine concerns today for which I recommended she see her PCP.  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 02/2016 Echo: EF 45%; b. 03/2016 Echo: EF 55%; c. 12/2019 Echo: EF 55-60%, no rwma, gr2 DD, nl RV size/fxn, mildly dil RA, mild MR.   Asthma    Chronic chest pain    a. ? vasospasm vs endothelial dysfxn. Improved w/ Ranexa but couldn't afford.   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 Presumed in setting of prior h/o MI w/ insignificant coronary dzs.   Diabetes mellitus without complication (HCC)    Heart murmur    History of NICM (nonischemic cardiomyopathy) (HCC)    a. 02/2016 Echo: EF 45%; b. 12/2019 Echo: EF 55-60%.   Hyperlipidemia    Hypertension    Myocardial infarction [facility])    Non-obstructive Coronary artery disease    a. Prior caths in 2007, 2011, 2012 - nonobs dzs; b. 03/2016 Cath ([facility]): LM nl, LAD/LCX/RCA insignificant dzs.   Obesity    Rate-dependent LBBB (left bundle branch block)    Remote Tobacco abuse    a. 30+ pack year history - quit 2019.   Seizures (HCC)     Past Surgical History:  Procedure Laterality Date   ANTERIOR CRUCIATE LIGAMENT REPAIR     BACK SURGERY     L4 and L5   CARDIAC CATHETERIZATION  04/09/2016   COLONOSCOPY     COLONOSCOPY WITH  PROPOFOL N/A 08/04/2019   Procedure: COLONOSCOPY WITH PROPOFOL;  Surgeon: Toney Reil, MD;  Location: [facility] ENDOSCOPY;  Service: Gastroenterology;  Laterality: N/A;   EMPYEMA DRAINAGE     at age 47   RIGHT/LEFT HEART CATH AND CORONARY ANGIOGRAPHY Bilateral 07/04/2022   Procedure: RIGHT/LEFT HEART CATH AND CORONARY ANGIOGRAPHY;  Surgeon: Iran Ouch, MD;  Location: [facility] INVASIVE CV LAB;  Service: Cardiovascular;  Laterality: Bilateral;   TONSILLECTOMY     TONSILLECTOMY AND ADENOIDECTOMY  2008   TUBAL LIGATION      Current Medications: Current Meds  Medication Sig   blood glucose meter kit and supplies KIT  Dispense based on patient and insurance preference. Use up to four times daily as directed. (FOR ICD-9 250.00, 250.01).   Blood Pressure Monitoring (BLOOD PRESSURE KIT) KIT 1 kit by Does not apply route daily as needed.   clopidogrel (PLAVIX) 75 MG tablet Take 1 tablet (75 mg total) by mouth once daily.   diphenhydrAMINE (BENADRYL) 50 MG tablet Take 50 mg by mouth 2 (two) times daily.   EPINEPHrine 0.3 mg/0.3 mL IJ SOAJ injection Inject 0.3 into the muscle as needed for anaphylaxis   glucose blood (RIGHTEST GS550 BLOOD GLUCOSE) test strip Use as directed to test blood sugar.   Insulin Pen Needle 32G X 6 MM MISC Use one pen needle with Ozempic once every week   isosorbide mononitrate (IMDUR) 60 MG 24 hr tablet Take 60 mg by mouth daily.   metoprolol tartrate (LOPRESSOR) 25 MG tablet Take 1 tablet (25 mg total) by mouth 2 (two) times daily.   nitroGLYCERIN (NITROSTAT) 0.4 MG SL tablet 1 TABLET UNDER TONGUE AS NEEDED FOR CHEST PAIN EVERY 5 MINUTES FOR MAX OF 3 DOSES IN 15 MINUTES; IF NO RELIEF AFTER 1ST DOSE CALL 911   pantoprazole (PROTONIX) 40 MG tablet Take 1 tablet (40 mg total) by mouth once daily.   potassium chloride SA (KLOR-CON M) 20 MEQ tablet Take 1 tablet (20 mEq total) by mouth 2 (two) times daily.   ranolazine (RANEXA) 500 MG 12 hr tablet Take 1 tablet (500 mg total) by mouth 2 (two) times daily.   Rightest GL300 Lancets MISC Use as directed to test blood sugar.   sacubitril-valsartan (ENTRESTO) 24-26 MG Take 1 tablet by mouth 2 (two) times daily.   Semaglutide, 1 MG/DOSE, (OZEMPIC, 1 MG/DOSE,) 4 MG/3ML SOPN Inject 1 mg into the skin once a week. Instead of Trulicity (Patient taking differently: Inject 1 mg into the skin once a week. Wednesdays)   simvastatin (ZOCOR) 20 MG tablet Take 1 tablet (20 mg total) by mouth once daily.   spironolactone (ALDACTONE) 25 MG tablet Take 1 tablet (25 mg total) by mouth daily.   torsemide (DEMADEX) 20 MG tablet Take 3 tablets (60 mg total) by mouth  daily.     Allergies:   Aspirin, Beef allergy, Celebrex [celecoxib], Fentanyl, Fluticasone propionate, Gabapentin, Hydrocodone-acetaminophen, Latex, Lisinopril, Morphine, Oxycodone-acetaminophen, Percocet [oxycodone-acetaminophen], Pork allergy, Simvastatin, Skelaxin [metaxalone], Beef-derived products, Pork-derived products, Vicodin [hydrocodone-acetaminophen], Albuterol, and Jardiance [empagliflozin]   Social History   Socioeconomic History   Marital status: Divorced    Spouse name: Not on file   Number of children: 2   Years of education: pre-requisites    Highest education level: GED or equivalent  Occupational History   Occupation: unemployed  Tobacco Use   Smoking status: Former    Packs/day: 1.00    Years: 30.00    Additional pack years: 0.00    Total pack years:  30.00    Types: Cigarettes    Quit date: 08/16/2017    Years since quitting: 5.1   Smokeless tobacco: Never  Vaping Use   Vaping Use: Never used  Substance and Sexual Activity   Alcohol use: Not Currently    Comment: last use 01/2022   Drug use: Not Currently    Comment: Previously used cocaine in the 1990s   Sexual activity: Yes    Birth control/protection: Condom  Other Topics Concern   Not on file  Social History Narrative   Currently on food stamps. They help somewhat but still difficult to buy food that she needs. Boyfriend helps pay rent with disability, they live together. Roof is leaking really badly. Lives in modular home. Social services bought tarp which helped, but it deteriorated from heat.    Social Determinants of Health   Financial Resource Strain: High Risk (02/12/2018)   Overall Financial Resource Strain (CARDIA)    Difficulty of Paying Living Expenses: Hard  Food Insecurity: No Food Insecurity (10/20/2021)   Hunger Vital Sign    Worried About Running Out of Food in the Last Year: Never true    Ran Out of Food in the Last Year: Never true  Transportation Needs: No Transportation Needs  (10/20/2021)   PRAPARE - Administrator, Civil Service (Medical): No    Lack of Transportation (Non-Medical): No  Physical Activity: Sufficiently Active (11/06/2017)   Exercise Vital Sign    Days of Exercise per Week: 2 days    Minutes of Exercise per Session: 100 min  Stress: Stress Concern Present (11/06/2017)   [employer] of Occupational Health - Occupational Stress Questionnaire    Feeling of Stress : To some extent  Social Connections: Moderately Isolated (11/06/2017)   Social Connection and Isolation Panel [NHANES]    Frequency of Communication with Friends and Family: More than three times a week    Frequency of Social Gatherings with Friends and Family: Once a week    Attends Religious Services: Never    Database administrator or Organizations: No    Attends Engineer, structural: Never    Marital Status: Divorced     Family History: The patient's family history includes Asthma in her daughter and son; COPD in her sister; Colon cancer in her maternal grandfather and mother; Diabetes in her mother and sister; Heart attack in her father and mother; Heart disease in her maternal grandmother and paternal grandfather; Hyperlipidemia in her maternal grandfather and paternal grandfather; Hypertension in her daughter, maternal grandfather, maternal grandmother, and paternal grandfather; Kidney disease in her sister; Migraines in her sister; Stroke in her father, mother, paternal grandmother, and sister; Thyroid disease in her daughter, mother, sister, and sister.  ROS:   Please see the history of present illness.     All other systems reviewed and are negative.  EKGs/Labs/Other Studies Reviewed:    The following studies were reviewed today: Cardiac cath 06/2022    There is mild left ventricular systolic dysfunction.   LV end diastolic pressure is normal.   The left ventricular ejection fraction is 50-55% by visual estimate.   1.  Minimal nonobstructive  coronary artery disease. 2.  Mildly reduced LV systolic function with an EF of 50%. 3.  Right heart catheterization showed normal filling pressures, normal pulmonary pressure and normal cardiac output.   Recommendations: Continue medical therapy. No evidence of volume overload.  I decreased the dose of torsemide to 40 mg daily.  Echo 05/2022  1. Left ventricular ejection fraction, by estimation, is 40 to 45%. Left  ventricular ejection fraction by PLAX is 41 %. The left ventricle has  mildly decreased function. The left ventricle demonstrates global  hypokinesis. Left ventricular diastolic  parameters are consistent with Grade I diastolic dysfunction (impaired  relaxation). The average left ventricular global longitudinal strain is  -13.6 %.   2. Right ventricular systolic function is normal. The right ventricular  size is normal. There is normal pulmonary artery systolic pressure. The  estimated right ventricular systolic pressure is 33.3 mmHg.   3. The mitral valve is normal in structure. Mild to moderate mitral valve  regurgitation. No evidence of mitral stenosis.   4. The aortic valve has an indeterminant number of cusps. Aortic valve  regurgitation is not visualized. No aortic stenosis is present.   5. The inferior vena cava is normal in size with greater than 50%  respiratory variability, suggesting right atrial pressure of 3 mmHg.   Comparison(s): Previous LVEF reported as 50-55%.   EKG:  EKG is  ordered today.  The ekg ordered today demonstrates NSR LBBB, 69bpm, TWI AVL  Recent Labs: 01/18/2022: ALT 22 06/23/2022: Platelets 282 07/04/2022: Hemoglobin 13.6 07/24/2022: B Natriuretic Peptide 21.9 09/27/2022: BUN 14; Creatinine, Ser 0.97; Potassium 3.7; Sodium 138  Recent Lipid Panel    Component Value Date/Time   CHOL 159 01/18/2022 1216   TRIG 198 (H) 01/18/2022 1216   HDL 32 (L) 01/18/2022 1216   CHOLHDL 5.0 (H) 01/18/2022 1216   CHOLHDL 5.7 02/24/2016 0618   VLDL 30  02/24/2016 0618   LDLCALC 93 01/18/2022 1216    Physical Exam:    VS:  BP 100/70 (BP Location: Left Arm, Patient Position: Sitting, Cuff Size: Normal)   Pulse 69   Ht 5\' 7"  (1.702 m)   Wt 280 lb (127 kg)   LMP 06/09/2018   SpO2 98%   BMI 43.85 kg/m     Wt Readings from Last 3 Encounters:  10/11/22 280 lb (127 kg)  10/03/22 280 lb (127 kg)  08/24/22 275 lb 8 oz (125 kg)     GEN:  Well nourished, well developed in no acute distress HEENT: Normal NECK: No JVD; No carotid bruits LYMPHATICS: No lymphadenopathy CARDIAC: RRR, no murmurs, rubs, gallops RESPIRATORY:  Clear to auscultation without rales, wheezing or rhonchi  ABDOMEN: Soft, non-tender, non-distended MUSCULOSKELETAL:  No edema; No deformity  SKIN: Warm and dry NEUROLOGIC:  Alert and oriented x 3 PSYCHIATRIC:  Normal affect   ASSESSMENT:    1. Coronary artery disease involving native coronary artery of native heart with other form of angina pectoris (HCC)   2. Essential hypertension   3. Hyperlipidemia, mixed   4. Chronic systolic heart failure (HCC)    PLAN:    In order of problems listed above:  Mild CAD Patient denies chest pain. Heart cath in 06/2022 showed no CAD. She has an allergy to aspirin. Continue Plavix, Simvastatin, Ranexa, Lopressor and SL NTG. No further ischemic work-up indicated at this time.   Chronic systolic heart failure Heart cath in 06/2022 showed normal LVEF 50-55%. Patient is euvolemic on exam today. She takes torsemide 60mg  daily, spironolactone 25mg  daily, entresto 24-26mg  BID, Lopressor 25mg  BID. BP limiting GDMT.   HLD LDL 93. Continue Simvastatin 20mg  daily.   HTN BP is soft. She denies any dizziness or lightheadedness. Continue Imdur 60mg  daily.   Disposition: Follow up in 3 month(s) with MD/APP    Signed, Ismar Yabut H  Lorna Few  10/11/2022 3:29 PM    [facility]

## 2022-10-25 ENCOUNTER — Other Ambulatory Visit: Payer: Self-pay | Admitting: Gerontology

## 2022-10-25 ENCOUNTER — Other Ambulatory Visit: Payer: Self-pay

## 2022-10-25 ENCOUNTER — Ambulatory Visit: Payer: Self-pay | Admitting: Gerontology

## 2022-10-26 ENCOUNTER — Other Ambulatory Visit: Payer: Self-pay

## 2022-10-26 ENCOUNTER — Ambulatory Visit: Payer: Self-pay | Admitting: Gerontology

## 2022-10-26 ENCOUNTER — Other Ambulatory Visit: Payer: Self-pay | Admitting: Gerontology

## 2022-10-26 VITALS — BP 125/84 | HR 70 | Temp 98.3°F | Wt 282.9 lb

## 2022-10-26 DIAGNOSIS — G8929 Other chronic pain: Secondary | ICD-10-CM

## 2022-10-26 DIAGNOSIS — K219 Gastro-esophageal reflux disease without esophagitis: Secondary | ICD-10-CM

## 2022-10-26 MED ORDER — PANTOPRAZOLE SODIUM 40 MG PO TBEC
40.0000 mg | DELAYED_RELEASE_TABLET | Freq: Every day | ORAL | 1 refills | Status: DC
Start: 2022-10-26 — End: 2022-11-02
  Filled 2022-10-26: qty 90, 90d supply, fill #0

## 2022-10-26 NOTE — Patient Instructions (Signed)
DASH Eating Plan DASH stands for Dietary Approaches to Stop Hypertension. The DASH eating plan is a healthy eating plan that has been shown to: Lower high blood pressure (hypertension). Reduce your risk for type 2 diabetes, heart disease, and stroke. Help with weight [name]. What are tips for following this plan? Reading food labels Check food labels for the amount of salt (sodium) per serving. Choose foods with less than 5 percent of the Daily Value (DV) of sodium. In general, foods with less than 300 milligrams (mg) of sodium per serving fit into this eating plan. To find whole grains, look for the word "whole" as the first word in the ingredient list. Shopping Buy products labeled as "low-sodium" or "no salt added." Buy fresh foods. Avoid canned foods and pre-made or frozen meals. Cooking Try not to add salt when you cook. Use salt-free seasonings or herbs instead of table salt or sea salt. Check with your health care provider or pharmacist before using salt substitutes. Do not fry foods. Cook foods in healthy ways, such as baking, boiling, grilling, roasting, or broiling. Cook using oils that are good for your heart. These include olive, canola, avocado, soybean, and sunflower oil. Meal planning  Eat a balanced diet. This should include: 4 or more servings of fruits and 4 or more servings of vegetables each day. Try to fill half of your plate with fruits and vegetables. 6-8 servings of whole grains each day. 6 or less servings of lean meat, poultry, or fish each day. 1 oz is 1 serving. A 3 oz (85 g) serving of meat is about the same size as the palm of your hand. One egg is 1 oz (28 g). 2-3 servings of low-fat dairy each day. One serving is 1 cup (237 mL). 1 serving of nuts, seeds, or beans 5 times each week. 2-3 servings of heart-healthy fats. Healthy fats called omega-3 fatty acids are found in foods such as walnuts, flaxseeds, fortified milks, and eggs. These fats are also found in  cold-water fish, such as sardines, salmon, and mackerel. Limit how much you eat of: Canned or prepackaged foods. Food that is high in trans fat, such as fried foods. Food that is high in saturated fat, such as fatty meat. Desserts and other sweets, sugary drinks, and other foods with added sugar. Full-fat dairy products. Do not salt foods before eating. Do not eat more than 4 egg yolks a week. Try to eat at least 2 vegetarian meals a week. Eat more home-cooked food and less restaurant, buffet, and fast food. Lifestyle When eating at a restaurant, ask if your food can be made with less salt or no salt. If you drink alcohol: Limit how much you have to: 0-1 drink a day if you are female. 0-2 drinks a day if you are female. Know how much alcohol is in your drink. In the [place] one drink is one 12 oz bottle of beer (355 mL), one 5 oz glass of wine (148 mL), or one 1 oz glass of hard liquor (44 mL). General information Avoid eating more than 2,300 mg of salt a day. If you have hypertension, you may need to reduce your sodium intake to 1,500 mg a day. Work with your provider to stay at a healthy body weight or lose weight. Ask what the best weight range is for you. On most days of the week, get at least 30 minutes of exercise that causes your heart to beat faster. This may include walking, swimming, or  biking. Work with your provider or dietitian to adjust your eating plan to meet your specific calorie needs. What foods should I eat? Fruits All fresh, dried, or frozen fruit. Canned fruits that are in their natural juice and do not have sugar added to them. Vegetables Fresh or frozen vegetables that are raw, steamed, roasted, or grilled. Low-sodium or reduced-sodium tomato and vegetable juice. Low-sodium or reduced-sodium tomato sauce and tomato paste. Low-sodium or reduced-sodium canned vegetables. Grains Whole-grain or whole-wheat bread. Whole-grain or whole-wheat pasta. Brown rice. Kathy [name]. Bulgur. Whole-grain and low-sodium cereals. Pita bread. Low-fat, low-sodium crackers. Whole-wheat flour tortillas. Meats and other proteins Skinless chicken or [place]. Ground chicken or [place]. Pork with fat trimmed off. Fish and seafood. Egg whites. Dried beans, peas, or lentils. Unsalted nuts, nut butters, and seeds. Unsalted canned beans. Lean cuts of beef with fat trimmed off. Low-sodium, lean precooked or cured meat, such as sausages or meat loaves. Dairy Low-fat (1%) or fat-free (skim) milk. Reduced-fat, low-fat, or fat-free cheeses. Nonfat, low-sodium ricotta or cottage cheese. Low-fat or nonfat yogurt. Low-fat, low-sodium cheese. Fats and oils Soft margarine without trans fats. Vegetable oil. Reduced-fat, low-fat, or light mayonnaise and salad dressings (reduced-sodium). Canola, safflower, olive, avocado, soybean, and sunflower oils. Avocado. Seasonings and condiments Herbs. Spices. Seasoning mixes without salt. Other foods Unsalted popcorn and pretzels. Fat-free sweets. The items listed above may not be all the foods and drinks you can have. Talk to a dietitian to learn more. What foods should I avoid? Fruits Canned fruit in a light or heavy syrup. Fried fruit. Fruit in cream or butter sauce. Vegetables Creamed or fried vegetables. Vegetables in a cheese sauce. Regular canned vegetables that are not marked as low-sodium or reduced-sodium. Regular canned tomato sauce and paste that are not marked as low-sodium or reduced-sodium. Regular tomato and vegetable juices that are not marked as low-sodium or reduced-sodium. Kathy [name]. Olives. Grains Baked goods made with fat, such as croissants, muffins, or some breads. Dry pasta or rice meal packs. Meats and other proteins Fatty cuts of meat. Ribs. Fried meat. Kathy [name]. Bologna, salami, and other precooked or cured meats, such as sausages or meat loaves, that are not lean and low in sodium. Fat from the back of a pig (fatback). Bratwurst.  Salted nuts and seeds. Canned beans with added salt. Canned or smoked fish. Whole eggs or egg yolks. Chicken or [place] with skin. Dairy Whole or 2% milk, cream, and half-and-half. Whole or full-fat cream cheese. Whole-fat or sweetened yogurt. Full-fat cheese. Nondairy creamers. Whipped toppings. Processed cheese and cheese spreads. Fats and oils Butter. Stick margarine. Lard. Shortening. Ghee. Bacon fat. Tropical oils, such as coconut, palm kernel, or palm oil. Seasonings and condiments Onion salt, garlic salt, seasoned salt, table salt, and sea salt. Worcestershire sauce. Tartar sauce. Barbecue sauce. Teriyaki sauce. Soy sauce, including reduced-sodium soy sauce. Steak sauce. Canned and packaged gravies. Fish sauce. Oyster sauce. Cocktail sauce. Store-bought horseradish. Ketchup. Mustard. Meat flavorings and tenderizers. Bouillon cubes. Hot sauces. Pre-made or packaged marinades. Pre-made or packaged taco seasonings. Relishes. Regular salad dressings. Other foods Salted popcorn and pretzels. The items listed above may not be all the foods and drinks you should avoid. Talk to a dietitian to learn more. Where to find more information National Heart, Lung, and Blood Institute (NHLBI): BuffaloDryCleaner.gl American Heart Association (AHA): heart.org Academy of Nutrition and Dietetics: eatright.org National Kidney Foundation (NKF): kidney.org This information is not intended to replace advice given to you by your health care provider. Make sure  you discuss any questions you have with your health care provider. Document Revised: 04/13/2022 Document Reviewed: 04/13/2022 Elsevier Patient Education  2024 Elsevier Inc. Calorie Counting for Kathy [name] Calories are units of energy. Your body needs a certain number of calories from food to keep going throughout the day. When you eat or drink more calories than your body needs, your body stores the extra calories mostly as fat. When you eat or drink fewer calories  than your body needs, your body burns fat to get the energy it needs. Calorie counting means keeping track of how many calories you eat and drink each day. Calorie counting can be helpful if you need to lose weight. If you eat fewer calories than your body needs, you should lose weight. Ask your health care provider what a healthy weight is for you. For calorie counting to work, you will need to eat the right number of calories each day to lose a healthy amount of weight per week. A dietitian can help you figure out how many calories you need in a day and will suggest ways to reach your calorie goal. A healthy amount of weight to lose each week is usually 1-2 lb (0.5-0.9 kg). This usually means that your daily calorie intake should be reduced by 500-750 calories. Eating 1,200-1,500 calories a day can help most women lose weight. Eating 1,500-1,800 calories a day can help most men lose weight. What do I need to know about calorie counting? Work with your health care provider or dietitian to determine how many calories you should get each day. To meet your daily calorie goal, you will need to: Find out how many calories are in each food that you would like to eat. Try to do this before you eat. Decide how much of the food you plan to eat. Keep a food log. Do this by writing down what you ate and how many calories it had. To successfully lose weight, it is important to balance calorie counting with a healthy lifestyle that includes regular activity. Where do I find calorie information?  The number of calories in a food can be found on a Nutrition Facts label. If a food does not have a Nutrition Facts label, try to look up the calories online or ask your dietitian for help. Remember that calories are listed per serving. If you choose to have more than one serving of a food, you will have to multiply the calories per serving by the number of servings you plan to eat. For example, the label on a package of  bread might say that a serving size is 1 slice and that there are 90 calories in a serving. If you eat 1 slice, you will have eaten 90 calories. If you eat 2 slices, you will have eaten 180 calories. How do I keep a food log? After each time that you eat, record the following in your food log as soon as possible: What you ate. Be sure to include toppings, sauces, and other extras on the food. How much you ate. This can be measured in cups, ounces, or number of items. How many calories were in each food and drink. The total number of calories in the food you ate. Keep your food log near you, such as in a pocket-sized notebook or on an app or website on your mobile phone. Some programs will calculate calories for you and show you how many calories you have left to meet your daily goal. What are  some portion-control tips? Know how many calories are in a serving. This will help you know how many servings you can have of a certain food. Use a measuring cup to measure serving sizes. You could also try weighing out portions on a kitchen scale. With time, you will be able to estimate serving sizes for some foods. Take time to put servings of different foods on your favorite plates or in your favorite bowls and cups so you know what a serving looks like. Try not to eat straight from a food's packaging, such as from a bag or box. Eating straight from the package makes it hard to see how much you are eating and can lead to overeating. Put the amount you would like to eat in a cup or on a plate to make sure you are eating the right portion. Use smaller plates, glasses, and bowls for smaller portions and to prevent overeating. Try not to multitask. For example, avoid watching TV or using your computer while eating. If it is time to eat, sit down at a table and enjoy your food. This will help you recognize when you are full. It will also help you be more mindful of what and how much you are eating. What are tips  for following this plan? Reading food labels Check the calorie count compared with the serving size. The serving size may be smaller than what you are used to eating. Check the source of the calories. Try to choose foods that are high in protein, fiber, and vitamins, and low in saturated fat, trans fat, and sodium. Shopping Read nutrition labels while you shop. This will help you make healthy decisions about which foods to buy. Pay attention to nutrition labels for low-fat or fat-free foods. These foods sometimes have the same number of calories or more calories than the full-fat versions. They also often have added sugar, starch, or salt to make up for flavor that was removed with the fat. Make a grocery list of lower-calorie foods and stick to it. Cooking Try to cook your favorite foods in a healthier way. For example, try baking instead of frying. Use low-fat dairy products. Meal planning Use more fruits and vegetables. One-half of your plate should be fruits and vegetables. Include lean proteins, such as chicken, [place], and fish. Lifestyle Each week, aim to do one of the following: 150 minutes of moderate exercise, such as walking. 75 minutes of vigorous exercise, such as running. General information Know how many calories are in the foods you eat most often. This will help you calculate calorie counts faster. Find a way of tracking calories that works for you. Get creative. Try different apps or programs if writing down calories does not work for you. What foods should I eat?  Eat nutritious foods. It is better to have a nutritious, high-calorie food, such as an avocado, than a food with few nutrients, such as a bag of potato chips. Use your calories on foods and drinks that will fill you up and will not leave you hungry soon after eating. Examples of foods that fill you up are nuts and nut butters, vegetables, lean proteins, and high-fiber foods such as whole grains. High-fiber foods  are foods with more than 5 g of fiber per serving. Pay attention to calories in drinks. Low-calorie drinks include water and unsweetened drinks. The items listed above may not be a complete list of foods and beverages you can eat. Contact a dietitian for more information. What foods  should I limit? Limit foods or drinks that are not good sources of vitamins, minerals, or protein or that are high in unhealthy fats. These include: Candy. Other sweets. Sodas, specialty coffee drinks, alcohol, and juice. The items listed above may not be a complete list of foods and beverages you should avoid. Contact a dietitian for more information. How do I count calories when eating out? Pay attention to portions. Often, portions are much larger when eating out. Try these tips to keep portions smaller: Consider sharing a meal instead of getting your own. If you get your own meal, eat only half of it. Before you start eating, ask for a container and put half of your meal into it. When available, consider ordering smaller portions from the menu instead of full portions. Pay attention to your food and drink choices. Knowing the way food is cooked and what is included with the meal can help you eat fewer calories. If calories are listed on the menu, choose the lower-calorie options. Choose dishes that include vegetables, fruits, whole grains, low-fat dairy products, and lean proteins. Choose items that are boiled, broiled, grilled, or steamed. Avoid items that are buttered, battered, fried, or served with cream sauce. Items labeled as crispy are usually fried, unless stated otherwise. Choose water, low-fat milk, unsweetened iced tea, or other drinks without added sugar. If you want an alcoholic beverage, choose a lower-calorie option, such as a glass of wine or light beer. Ask for dressings, sauces, and syrups on the side. These are usually high in calories, so you should limit the amount you eat. If you want a  salad, choose a garden salad and ask for grilled meats. Avoid extra toppings such as bacon, cheese, or fried items. Ask for the dressing on the side, or ask for olive oil and vinegar or lemon to use as dressing. Estimate how many servings of a food you are given. Knowing serving sizes will help you be aware of how much food you are eating at restaurants. Where to find more information Centers for Disease Control and Prevention: FootballExhibition.com.br U.S. Department of Agriculture: WrestlingReporter.dk Summary Calorie counting means keeping track of how many calories you eat and drink each day. If you eat fewer calories than your body needs, you should lose weight. A healthy amount of weight to lose per week is usually 1-2 lb (0.5-0.9 kg). This usually means reducing your daily calorie intake by 500-750 calories. The number of calories in a food can be found on a Nutrition Facts label. If a food does not have a Nutrition Facts label, try to look up the calories online or ask your dietitian for help. Use smaller plates, glasses, and bowls for smaller portions and to prevent overeating. Use your calories on foods and drinks that will fill you up and not leave you hungry shortly after a meal. This information is not intended to replace advice given to you by your health care provider. Make sure you discuss any questions you have with your health care provider. Document Revised: 05/08/2019 Document Reviewed: 05/08/2019 Elsevier Patient Education  2023 [employer].

## 2022-10-26 NOTE — Progress Notes (Signed)
Established Patient Office Visit  Subjective   Patient ID: Kathy [name], female    DOB: [birth year]  Age: 57 y.o. MRN: [id number]  [facility] chief complaint on file.  HPI Kathy [name]  is a 57 y/o female who has history of Heart failure, Asthma, Hyperlipidemia, Heart Murmur, Hypertension, who presents for follow up visit.  She reports the recent corticosteroid shots received at [facility] on 08/15/22 to bilateral knees and shoulders however had no relief. Therapy was ordered in [facility] but she was unable to afford the gas for mileage. Therefore the provider are working on trying to find a Futures trader (over a month ago). She states the provider discussed surgical intervention for the left knee however she  will have to loss 25 lbs for a total weight of 250 lbs prior to surgery. She has her weight log with her last readings at 283.4 lbs this morning. She reports her pain level 9/10 for her knees/shoulders with limited ambulation and ADLs. She states  that she will need to reapply for charity care with [facility]. She states she will follow up with CHF clinic the end of this month. Reports Ozempic is not working "anymore" states she is snacking more on her restrictions for sodium and limit with fluid since March 2022. Overall, she states that she is doing well and offers no further complaint.   Review of Systems  Constitutional: Negative.   HENT:  Negative for hearing loss.   Eyes: Negative.   Respiratory: Negative.    Cardiovascular: Negative.   Genitourinary: Negative.   Musculoskeletal:  Positive for joint pain.       Bilateral history of knee pain and should pain 9/10 [facility] orthopedic).  Skin: Negative.   Neurological: Negative.   Endo/Heme/Allergies: Negative.   Psychiatric/Behavioral: Negative.        Objective:     BP 125/84 (BP Location: Left Arm, Patient Position: Sitting, Cuff Size: Large)   Pulse 70   Temp 98.3 F (36.8 C) (Oral)   Wt 282 lb 13.6 oz (128.3 kg)    LMP 06/09/2018   SpO2 95%   BMI 44.30 kg/m  BP Readings from Last 3 Encounters:  10/26/22 125/84  10/11/22 100/70  10/03/22 (!) 82/42   Wt Readings from Last 3 Encounters:  10/26/22 282 lb 13.6 oz (128.3 kg)  10/11/22 280 lb (127 kg)  10/03/22 280 lb (127 kg)      Physical Exam HENT:     Head: Normocephalic.     Ears:     Comments: Right ear HOH    Mouth/Throat:     Mouth: Mucous membranes are moist.  Eyes:     Pupils: Pupils are equal, round, and reactive to light.  Cardiovascular:     Rate and Rhythm: Normal rate and regular rhythm.     Pulses: Normal pulses.     Heart sounds: Normal heart sounds.  Pulmonary:     Effort: Pulmonary effort is normal.     Breath sounds: Normal breath sounds.  Abdominal:     General: Bowel sounds are normal.     Palpations: Abdomen is soft.  Musculoskeletal:        General: Normal range of motion.     Cervical back: Normal range of motion.  Skin:    General: Skin is warm and dry.  Neurological:     General: No focal deficit present.     Mental Status: She is alert and oriented to person, place, and  time.  Psychiatric:        Mood and Affect: Mood normal.        Behavior: Behavior normal.      No results found for any visits on 10/26/22.  Last CBC Lab Results  Component Value Date   WBC 7.8 06/23/2022   HGB 13.6 07/04/2022   HCT 40.0 07/04/2022   MCV 87.2 06/23/2022   MCH 28.3 06/23/2022   RDW 14.2 06/23/2022   PLT 282 06/23/2022   Last metabolic panel Lab Results  Component Value Date   GLUCOSE 106 (H) 09/27/2022   NA 138 09/27/2022   K 3.7 09/27/2022   CL 100 09/27/2022   CO2 26 09/27/2022   BUN 14 09/27/2022   CREATININE 0.97 09/27/2022   GFRNONAA >60 09/27/2022   CALCIUM 9.0 09/27/2022   PHOS 4.0 06/21/2020   PROT 7.1 01/18/2022   ALBUMIN 4.4 01/18/2022   LABGLOB 2.7 01/18/2022   AGRATIO 1.6 01/18/2022   BILITOT 0.3 01/18/2022   ALKPHOS 123 (H) 01/18/2022   AST 15 01/18/2022   ALT 22 01/18/2022    ANIONGAP 12 09/27/2022   Last lipids Lab Results  Component Value Date   CHOL 159 01/18/2022   HDL 32 (L) 01/18/2022   LDLCALC 93 01/18/2022   TRIG 198 (H) 01/18/2022   CHOLHDL 5.0 (H) 01/18/2022   Last hemoglobin A1c Lab Results  Component Value Date   HGBA1C 5.6 07/26/2022   Last thyroid functions Lab Results  Component Value Date   TSH 1.770 10/04/2017   Last vitamin D No results found for: "25OHVITD2", "25OHVITD3", "VD25OH" Last vitamin B12 and Folate No results found for: "VITAMINB12", "FOLATE"    The 10-year ASCVD risk score (Arnett DK, et al., 2019) is: 6.9%    Assessment & Plan:  1. Gastroesophageal reflux disease, unspecified whether esophagitis present -She will continue medication to prevent ongoing GERD. Dietary measures discussed to reduce flare ups.  -Avoid spicy, fatty and fried food -Avoid sodas and sour juices -Avoid heavy meals -Avoid eating 4 hours before bedtime -Elevate head of bed at night - pantoprazole (PROTONIX) 40 MG tablet; Take 1 tablet (40 mg total) by mouth once daily.  Dispense: 90 tablet; Refill: 1  2. Chronic left shoulder pain -She will follow up with  Dr. Gavin Potters to assist her shoulder pain 9/10 with no relief from there recent Corticosteroid injection from [facility] (orthopedic provider). Will schedule an appointment with Dr. Gavin Potters @ [facility].   3. Class 3 severe obesity with body mass index (BMI) of 50.0 to 59.9 in adult, unspecified obesity type, unspecified whether serious comorbidity present [facility]) - She will continue to take Ozempic for with logged documentation of recent weights fluctuating from 275-286 lbs with today's weight at 283.4 lbs. Encouraged her on healthy eating habits and to consume small incremented meals to assist with cravings and improve her eating habits. Will provider low carbohydrate diet and DASH diet to assist with guideline to improve pt's eating habits.   4. Chronic pain of both knees -She will  continue on current medication, follow up with  Dr. Gavin Potters @ [facility].  She was advised to go to the ED for worsening symptoms.    Return in about 2 months (around 12/26/2022).    Elliot Cousin, RN

## 2022-10-27 ENCOUNTER — Other Ambulatory Visit: Payer: Self-pay

## 2022-10-30 ENCOUNTER — Other Ambulatory Visit: Payer: Self-pay

## 2022-10-31 ENCOUNTER — Other Ambulatory Visit: Payer: Self-pay

## 2022-11-02 ENCOUNTER — Other Ambulatory Visit: Payer: Self-pay | Admitting: Gerontology

## 2022-11-02 ENCOUNTER — Encounter: Payer: Self-pay | Admitting: Family

## 2022-11-02 ENCOUNTER — Ambulatory Visit: Payer: Medicaid Other | Attending: Family | Admitting: Family

## 2022-11-02 ENCOUNTER — Other Ambulatory Visit: Payer: Self-pay

## 2022-11-02 VITALS — BP 109/72 | HR 70 | Ht 67.0 in | Wt 282.0 lb

## 2022-11-02 DIAGNOSIS — I10 Essential (primary) hypertension: Secondary | ICD-10-CM

## 2022-11-02 DIAGNOSIS — E119 Type 2 diabetes mellitus without complications: Secondary | ICD-10-CM | POA: Insufficient documentation

## 2022-11-02 DIAGNOSIS — I2510 Atherosclerotic heart disease of native coronary artery without angina pectoris: Secondary | ICD-10-CM | POA: Diagnosis not present

## 2022-11-02 DIAGNOSIS — I5022 Chronic systolic (congestive) heart failure: Secondary | ICD-10-CM | POA: Insufficient documentation

## 2022-11-02 DIAGNOSIS — I110 Hypertensive heart disease with heart failure: Secondary | ICD-10-CM | POA: Diagnosis present

## 2022-11-02 DIAGNOSIS — K219 Gastro-esophageal reflux disease without esophagitis: Secondary | ICD-10-CM | POA: Insufficient documentation

## 2022-11-02 MED ORDER — TORSEMIDE 20 MG PO TABS
60.0000 mg | ORAL_TABLET | Freq: Every day | ORAL | 3 refills | Status: DC
  Filled 2022-11-02: qty 270, 90d supply, fill #0
  Filled 2023-01-25: qty 270, 90d supply, fill #1
  Filled 2023-05-02: qty 270, 90d supply, fill #2
  Filled 2023-08-01: qty 270, 90d supply, fill #3

## 2022-11-02 MED ORDER — PANTOPRAZOLE SODIUM 40 MG PO TBEC
40.0000 mg | DELAYED_RELEASE_TABLET | Freq: Every day | ORAL | 3 refills | Status: DC
Start: 2022-11-02 — End: 2023-11-02
  Filled 2022-11-02: qty 90, 90d supply, fill #0
  Filled 2023-01-25 (×2): qty 90, 90d supply, fill #1
  Filled 2023-04-13: qty 90, 90d supply, fill #2
  Filled 2023-08-01: qty 90, 90d supply, fill #3

## 2022-11-02 MED ORDER — SPIRONOLACTONE 25 MG PO TABS
25.0000 mg | ORAL_TABLET | Freq: Every day | ORAL | 3 refills | Status: DC
  Filled 2022-11-02: qty 90, 90d supply, fill #0
  Filled 2023-01-31: qty 90, 90d supply, fill #1
  Filled 2023-05-02: qty 90, 90d supply, fill #2
  Filled 2023-08-01: qty 90, 90d supply, fill #3

## 2022-11-02 MED ORDER — OZEMPIC (1 MG/DOSE) 4 MG/3ML ~~LOC~~ SOPN
1.0000 mg | PEN_INJECTOR | SUBCUTANEOUS | 6 refills | Status: DC
  Filled 2022-11-02: qty 3, 28d supply, fill #0
  Filled 2022-12-06 – 2022-12-13 (×2): qty 3, 28d supply, fill #1
  Filled 2023-01-10 – 2023-01-17 (×2): qty 3, 28d supply, fill #2
  Filled 2023-05-31: qty 12, 112d supply, fill #3

## 2022-11-02 NOTE — Progress Notes (Signed)
PCP: Rolm Gala, NP (last seen 07/24) Primary Cardiologist: Lorine Bears, MD (last seen 07/24)  HPI:  Kathy [name] is a 57 y/o female with a history of asthma, CAD (mild nonobstructive), chronic chest pain, hyperlipidemia, HTN, seizures, obesity, DM, previous tobacco use and chronic heart failure.   Has not been admitted or been in the ED in the last 6 months.   Echo 02/27/17: EF of 55-60%. Echo 01/06/20: EF of 55-60% along with mild LVH and mild MR Echo 06/14/20: EF of 50-55% along with mild LVH. Echo 06/08/22: EF 40-45% along with mild/moderate MR.    RHC/LHC 07/04/22:   There is mild left ventricular systolic dysfunction.   LV end diastolic pressure is normal.   The left ventricular ejection fraction is 50-55% by visual estimate.  1.  Minimal nonobstructive coronary artery disease. 2.  Mildly reduced LV systolic function with an EF of 50%. 3.  Right heart catheterization showed normal filling pressures, normal pulmonary pressure and normal cardiac output.  She presents today for a HF follow up visit with a chief complaint of minimal SOB, fatigue, minmal edema, some dizziness and trouble sleeping due to anxiety. Denies chest pain, palpitations or cough.   Went and ate at [employer] 4 days ago and developed weight gain, edema and worsening SOB. Ate 2 pieces of spinach pizza  and salad with bacon bits & olives. Her wt went up to 288 lbs so she took an extra 20mg  torsemide that night. The day next her weight was down to 284 pounds but she took 1 extra torsemide again. Symptoms are much improved.   ROS: All systems negative except as listed in HPI, PMH and Problem List.  SH:  Social History   Socioeconomic History   Marital status: Divorced    Spouse name: Not on file   Number of children: 2   Years of education: pre-requisites    Highest education level: GED or equivalent  Occupational History   Occupation: unemployed  Tobacco Use   Smoking status: Former    Current  packs/day: 0.00    Average packs/day: 1 pack/day for 30.0 years (30.0 ttl pk-yrs)    Types: Cigarettes    Start date: 08/17/1987    Quit date: 08/16/2017    Years since quitting: 5.2   Smokeless tobacco: Never  Vaping Use   Vaping status: Never Used  Substance and Sexual Activity   Alcohol use: Not Currently    Comment: last use 01/2022   Drug use: Not Currently    Comment: Previously used cocaine in the 1990s   Sexual activity: Yes    Birth control/protection: Condom  Other Topics Concern   Not on file  Social History Narrative   Currently on food stamps. They help somewhat but still difficult to buy food that she needs. Boyfriend helps pay rent with disability, they live together. Roof is leaking really badly. Lives in modular home. Social services bought tarp which helped, but it deteriorated from heat.    Social Determinants of Health   Financial Resource Strain: High Risk (02/12/2018)   Overall Financial Resource Strain (CARDIA)    Difficulty of Paying Living Expenses: Hard  Food Insecurity: No Food Insecurity (10/20/2021)   Hunger Vital Sign    Worried About Running Out of Food in the Last Year: Never true    Ran Out of Food in the Last Year: Never true  Transportation Needs: No Transportation Needs (10/20/2021)   PRAPARE - Administrator, Civil Service (  Medical): No    Lack of Transportation (Non-Medical): No  Physical Activity: Sufficiently Active (11/06/2017)   Exercise Vital Sign    Days of Exercise per Week: 2 days    Minutes of Exercise per Session: 100 min  Stress: Stress Concern Present (11/06/2017)   [employer] of Occupational Health - Occupational Stress Questionnaire    Feeling of Stress : To some extent  Social Connections: Moderately Isolated (11/06/2017)   Social Connection and Isolation Panel [NHANES]    Frequency of Communication with Friends and Family: More than three times a week    Frequency of Social Gatherings with Friends and Family:  Once a week    Attends Religious Services: Never    Database administrator or Organizations: No    Attends Banker Meetings: Never    Marital Status: Divorced  Catering manager Violence: Not At Risk (11/06/2017)   Humiliation, Afraid, Rape, and Kick questionnaire    Fear of Current or Ex-Partner: No    Emotionally Abused: No    Physically Abused: No    Sexually Abused: No    FH:  Family History  Problem Relation Age of Onset   Kidney disease Sister    Diabetes Sister    COPD Sister    Stroke Sister    Thyroid disease Sister    Diabetes Mother    Stroke Mother    Heart attack Mother    Thyroid disease Mother    Colon cancer Mother    Stroke Father    Heart attack Father    Asthma Daughter    Hypertension Daughter    Thyroid disease Daughter    Asthma Son    Heart disease Maternal Grandmother    Hypertension Maternal Grandmother    Colon cancer Maternal Grandfather    Hypertension Maternal Grandfather    Hyperlipidemia Maternal Grandfather    Stroke Paternal Grandmother    Hypertension Paternal Grandfather    Hyperlipidemia Paternal Grandfather    Heart disease Paternal Grandfather    Thyroid disease Sister    Migraines Sister     Past Medical History:  Diagnosis Date   (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 02/2016 Echo: EF 45%; b. 03/2016 Echo: EF 55%; c. 12/2019 Echo: EF 55-60%, no rwma, gr2 DD, nl RV size/fxn, mildly dil RA, mild MR.   Asthma    Chronic chest pain    a. ? vasospasm vs endothelial dysfxn. Improved w/ Ranexa but couldn't afford.   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 Presumed in setting of prior h/o MI w/ insignificant coronary dzs.   Diabetes mellitus without complication (HCC)    Heart murmur    History of NICM (nonischemic cardiomyopathy) (HCC)    a. 02/2016 Echo: EF 45%; b. 12/2019 Echo: EF 55-60%.   Hyperlipidemia    Hypertension    Myocardial infarction [facility])    Non-obstructive Coronary artery  disease    a. Prior caths in 2007, 2011, 2012 - nonobs dzs; b. 03/2016 Cath ([facility]): LM nl, LAD/LCX/RCA insignificant dzs.   Obesity    Rate-dependent LBBB (left bundle branch block)    Remote Tobacco abuse    a. 30+ pack year history - quit 2019.   Seizures (HCC)     Current Outpatient Medications  Medication Sig Dispense Refill   blood glucose meter kit and supplies KIT Dispense based on patient and insurance preference. Use up to four times daily as directed. (FOR ICD-9 250.00, 250.01). 1  each 0   Blood Pressure Monitoring (BLOOD PRESSURE KIT) KIT 1 kit by Does not apply route daily as needed. 1 kit 0   clopidogrel (PLAVIX) 75 MG tablet Take 1 tablet (75 mg total) by mouth once daily. 90 tablet 3   diphenhydrAMINE (BENADRYL) 50 MG tablet Take 50 mg by mouth 2 (two) times daily.     EPINEPHrine 0.3 mg/0.3 mL IJ SOAJ injection Inject 0.3 into the muscle as needed for anaphylaxis 4 each 6   glucose blood (RIGHTEST GS550 BLOOD GLUCOSE) test strip Use as directed to test blood sugar. 100 each 11   Insulin Pen Needle 32G X 6 MM MISC Use one pen needle with Ozempic once every week 100 each 3   isosorbide mononitrate (IMDUR) 60 MG 24 hr tablet Take 60 mg by mouth daily.     metoprolol tartrate (LOPRESSOR) 25 MG tablet Take 1 tablet (25 mg total) by mouth 2 (two) times daily. 180 tablet 3   nitroGLYCERIN (NITROSTAT) 0.4 MG SL tablet 1 TABLET UNDER TONGUE AS NEEDED FOR CHEST PAIN EVERY 5 MINUTES FOR MAX OF 3 DOSES IN 15 MINUTES; IF NO RELIEF AFTER 1ST DOSE CALL 911 25 tablet 0   pantoprazole (PROTONIX) 40 MG tablet Take 1 tablet (40 mg total) by mouth once daily. 90 tablet 1   potassium chloride SA (KLOR-CON M) 20 MEQ tablet Take 1 tablet (20 mEq total) by mouth 2 (two) times daily. 180 tablet 3   ranolazine (RANEXA) 500 MG 12 hr tablet Take 1 tablet (500 mg total) by mouth 2 (two) times daily. 180 tablet 3   Rightest GL300 Lancets MISC Use as directed to test blood sugar. 100 each 11    sacubitril-valsartan (ENTRESTO) 24-26 MG Take 1 tablet by mouth 2 (two) times daily. 180 tablet 3   Semaglutide, 1 MG/DOSE, (OZEMPIC, 1 MG/DOSE,) 4 MG/3ML SOPN Inject 1 mg into the skin once a week. Instead of Trulicity (Patient taking differently: Inject 1 mg into the skin once a week. Wednesdays) 3 mL 6   simvastatin (ZOCOR) 20 MG tablet Take 1 tablet (20 mg total) by mouth once daily. 90 tablet 1   spironolactone (ALDACTONE) 25 MG tablet Take 1 tablet (25 mg total) by mouth daily. 90 tablet 3   torsemide (DEMADEX) 20 MG tablet Take 3 tablets (60 mg total) by mouth daily. 270 tablet 3   No current facility-administered medications for this visit.   Vitals:   11/02/22 1528  BP: 109/72  Pulse: 70  SpO2: 99%  Weight: 282 lb (127.9 kg)  Height: 5\' 7"  (1.702 m)   Wt Readings from Last 3 Encounters:  11/02/22 282 lb (127.9 kg)  10/26/22 282 lb 13.6 oz (128.3 kg)  10/11/22 280 lb (127 kg)   Lab Results  Component Value Date   CREATININE 0.97 09/27/2022   CREATININE 0.93 08/09/2022   CREATININE 0.87 06/23/2022   PHYSICAL EXAM:  General:  Well appearing. No resp difficulty HEENT: normal Neck: supple. JVP flat. No lymphadenopathy or thryomegaly appreciated. Cor: PMI normal. Regular rate & rhythm. No rubs, gallops or murmurs. Lungs: clear Abdomen: soft, nontender, nondistended. No hepatosplenomegaly. No bruits or masses.  Extremities: no cyanosis, clubbing, rash, trace pitting edema bilaterally Neuro: alert & oriented x3, cranial nerves grossly intact. Moves all 4 extremities w/o difficulty. Affect pleasant.   ECG: 08/24/22 @ cardiology office showed NSR w/ LBBB   ASSESSMENT & PLAN:  1: Chronic heart failure with mildly reduced ejection fraction with LVH- - etiology likely LBBB  as cath showed nonobstructive CAD - NYHA class II - euvolemic today  - weighing daily; reminded to call for an overnight weight gain of >2 pounds or a weekly weight gain of >5 pounds - weight up 2 pounds  from last visit here 1 month ago - Echo 02/27/17: EF of 55-60%. - Echo 01/06/20: EF of 55-60% along with mild LVH and mild MR - Echo 06/14/20: EF of 50-55% along with mild LVH.  - echo 06/08/22: EF 40-45% along with mild/moderate MR.  - has been following a daily 2L fluid intake along with closely following a 2000mg  sodium diet except for her recent trip to [employer] - continue spironolactone 25mg  daily - continue torsemide 60mg  daily/ potassium BID - continue entresto 24/26mg  BID - jardiance caused HA, nausea and increased thirst so this was stopped - instructed her to continue to monitor edema and if this doesn't continue to improve, to call us back - BNP 06/12/20 was 45.8  2: HTN- - BP 109/72 - saw PCP (llobachie NP) 07/24 - BMP 09/27/22 reviewed and showed sodium 138, potassium 3.7, creatinine 0.97 and GFR >60  3: DM- - A1c 07/26/22 was 5.6% - home glucose today was 95 - ozempic 1mg  weekly - has endocrinology appt in a few months  4: Nonobstructive CAD- - saw cardiology Fransico Michael) 07/24 - RHC/LHC 07/04/22:   There is mild left ventricular systolic dysfunction.   LV end diastolic pressure is normal.   The left ventricular ejection fraction is 50-55% by visual estimate.  1.  Minimal nonobstructive coronary artery disease. 2.  Mildly reduced LV systolic function with an EF of 50%. 3.  Right heart catheterization showed normal filling pressures, normal pulmonary pressure and normal cardiac output. - continue plavix 75mg  daily - reduce isosorbide MN 60mg  daily per above due to hypotension - continue metoprolol tartrate 25mg  BID - continue ranexa 500mg  BID  5: GERD- - refilled protonix for patient today  Return in 1 month, sooner if needed.

## 2022-11-03 ENCOUNTER — Encounter: Payer: Self-pay | Admitting: Family

## 2022-11-08 ENCOUNTER — Other Ambulatory Visit: Payer: Self-pay

## 2022-11-14 ENCOUNTER — Telehealth: Payer: Self-pay | Admitting: Cardiovascular Disease

## 2022-11-14 ENCOUNTER — Other Ambulatory Visit: Payer: Self-pay

## 2022-11-14 MED ORDER — ISOSORBIDE MONONITRATE ER 60 MG PO TB24
60.0000 mg | ORAL_TABLET | Freq: Every day | ORAL | 3 refills | Status: DC
  Filled 2022-11-14: qty 90, 90d supply, fill #0
  Filled 2023-02-14: qty 90, 90d supply, fill #1
  Filled 2023-05-13: qty 90, 90d supply, fill #2
  Filled 2023-08-01: qty 90, 90d supply, fill #3

## 2022-11-14 NOTE — Telephone Encounter (Signed)
Pt c/o medication issue:  1. Name of Medication:   isosorbide mononitrate (IMDUR) 60 MG 24 hr tablet    2. How are you currently taking this medication (dosage and times per day)?  Take 60 mg by mouth daily. Pt reports dosage change of 30MG  daily        3. Are you having a reaction (difficulty breathing--STAT)? No  4. What is your medication issue? Pt is requesting a callback regarding her medication being discontinued. Please advise

## 2022-11-14 NOTE — Telephone Encounter (Signed)
Pt called stating she received a message from her pharmacy stating her imdur was discontinued. Pt stated she saw Clarisa Kindred on 7/25 and was instructed to decrease to 60 mg daily from 60 mg BID. Per last office visit with Cadence Furth on 10/11/22, PA noted to continue 60 mg daily.  Pt confirmed she is only taking 60 mg daily  Please advise

## 2022-11-14 NOTE — Telephone Encounter (Signed)
Pt made aware Updated prescription sent to pharmacy.   Furth, Cadence H, PA-C  You8 minutes ago (3:14 PM)    It looks like the dose was Imdur 60mg  daily. So we can continue that dose.

## 2022-11-15 ENCOUNTER — Other Ambulatory Visit: Payer: Self-pay

## 2022-11-23 ENCOUNTER — Other Ambulatory Visit: Payer: Self-pay

## 2022-12-04 ENCOUNTER — Encounter: Payer: Self-pay | Admitting: Family

## 2022-12-04 ENCOUNTER — Ambulatory Visit: Payer: Medicaid Other | Attending: Family | Admitting: Family

## 2022-12-04 ENCOUNTER — Other Ambulatory Visit (HOSPITAL_COMMUNITY): Payer: Self-pay | Admitting: Internal Medicine

## 2022-12-04 ENCOUNTER — Inpatient Hospital Stay (HOSPITAL_COMMUNITY)
Admission: RE | Admit: 2022-12-04 | Discharge: 2022-12-04 | Disposition: A | Discharge status: Home / Self Care | Payer: Self-pay | Source: Ambulatory Visit | Attending: Internal Medicine | Admitting: Internal Medicine

## 2022-12-04 VITALS — BP 91/56 | HR 64 | Ht 67.0 in | Wt 281.0 lb

## 2022-12-04 DIAGNOSIS — R5383 Other fatigue: Secondary | ICD-10-CM | POA: Insufficient documentation

## 2022-12-04 DIAGNOSIS — R002 Palpitations: Secondary | ICD-10-CM | POA: Insufficient documentation

## 2022-12-04 DIAGNOSIS — I110 Hypertensive heart disease with heart failure: Secondary | ICD-10-CM | POA: Insufficient documentation

## 2022-12-04 DIAGNOSIS — E119 Type 2 diabetes mellitus without complications: Secondary | ICD-10-CM | POA: Diagnosis not present

## 2022-12-04 DIAGNOSIS — M25519 Pain in unspecified shoulder: Secondary | ICD-10-CM | POA: Insufficient documentation

## 2022-12-04 DIAGNOSIS — E785 Hyperlipidemia, unspecified: Secondary | ICD-10-CM | POA: Insufficient documentation

## 2022-12-04 DIAGNOSIS — Z794 Long term (current) use of insulin: Secondary | ICD-10-CM | POA: Insufficient documentation

## 2022-12-04 DIAGNOSIS — I5032 Chronic diastolic (congestive) heart failure: Secondary | ICD-10-CM | POA: Insufficient documentation

## 2022-12-04 DIAGNOSIS — Z87891 Personal history of nicotine dependence: Secondary | ICD-10-CM | POA: Insufficient documentation

## 2022-12-04 DIAGNOSIS — I5022 Chronic systolic (congestive) heart failure: Secondary | ICD-10-CM

## 2022-12-04 DIAGNOSIS — E669 Obesity, unspecified: Secondary | ICD-10-CM | POA: Diagnosis not present

## 2022-12-04 DIAGNOSIS — I10 Essential (primary) hypertension: Secondary | ICD-10-CM

## 2022-12-04 DIAGNOSIS — Z79899 Other long term (current) drug therapy: Secondary | ICD-10-CM | POA: Diagnosis not present

## 2022-12-04 DIAGNOSIS — J45909 Unspecified asthma, uncomplicated: Secondary | ICD-10-CM | POA: Diagnosis not present

## 2022-12-04 DIAGNOSIS — R0602 Shortness of breath: Secondary | ICD-10-CM | POA: Insufficient documentation

## 2022-12-04 DIAGNOSIS — G8929 Other chronic pain: Secondary | ICD-10-CM | POA: Insufficient documentation

## 2022-12-04 DIAGNOSIS — Z7985 Long-term (current) use of injectable non-insulin antidiabetic drugs: Secondary | ICD-10-CM | POA: Diagnosis not present

## 2022-12-04 DIAGNOSIS — I2510 Atherosclerotic heart disease of native coronary artery without angina pectoris: Secondary | ICD-10-CM | POA: Insufficient documentation

## 2022-12-04 DIAGNOSIS — R0789 Other chest pain: Secondary | ICD-10-CM | POA: Insufficient documentation

## 2022-12-04 DIAGNOSIS — Z7902 Long term (current) use of antithrombotics/antiplatelets: Secondary | ICD-10-CM | POA: Diagnosis not present

## 2022-12-04 NOTE — Progress Notes (Signed)
REDS VEST READING= 37% CHEST RULER=39 HEIGHT MARKER=D

## 2022-12-04 NOTE — Progress Notes (Signed)
PCP: Rolm Gala, NP (last seen 07/24) Primary Cardiologist: Lorine Bears, MD (last seen 07/24)  HPI:  Kathy [name] is a 57 y/o female with a history of asthma, CAD (mild nonobstructive), chronic chest pain, hyperlipidemia, HTN, seizures, obesity, DM, previous tobacco use and chronic heart failure.   Has not been admitted or been in the ED in the last 6 months.   Echo 02/27/17: EF of 55-60%. Echo 01/06/20: EF of 55-60% along with mild LVH and mild MR Echo 06/14/20: EF of 50-55% along with mild LVH. Echo 06/08/22: EF 40-45% along with mild/moderate MR.    RHC/LHC 07/04/22:   There is mild left ventricular systolic dysfunction.   LV end diastolic pressure is normal.   The left ventricular ejection fraction is 50-55% by visual estimate.  1.  Minimal nonobstructive coronary artery disease. 2.  Mildly reduced LV systolic function with an EF of 50%. 3.  Right heart catheterization showed normal filling pressures, normal pulmonary pressure and normal cardiac output.  She presents today for a HF follow up visit with a chief complaint of intermittent shortness of breath. Chronic in nature. Has associated fatigue, intermittent chest pain, chronic neck, knee, shoulder pain and chronic trouble sleeping along with this. Denies cough or dizziness.   Couple of days ago, she felt like her heart was racing, even though it wasn't. Reports that it didn't last long and resolved on its own. Occurred a couple of times over these last few days. Says that she feels like she has to take a deep breath and then feels like her heart skips a beat. Does report begin under quite a bit of stress and is wondering if that may have contributed to it.   Isosorbide was decreased to 60mg  daily from BID due to hypotension.    ROS: All systems negative except as listed in HPI, PMH and Problem List.  SH:  Social History   Socioeconomic History   Marital status: Divorced    Spouse name: Not on file   Number of children:  2   Years of education: pre-requisites    Highest education level: GED or equivalent  Occupational History   Occupation: unemployed  Tobacco Use   Smoking status: Former    Current packs/day: 0.00    Average packs/day: 1 pack/day for 30.0 years (30.0 ttl pk-yrs)    Types: Cigarettes    Start date: 08/17/1987    Quit date: 08/16/2017    Years since quitting: 5.3   Smokeless tobacco: Never  Vaping Use   Vaping status: Never Used  Substance and Sexual Activity   Alcohol use: Not Currently    Comment: last use 01/2022   Drug use: Not Currently    Comment: Previously used cocaine in the 1990s   Sexual activity: Yes    Birth control/protection: Condom  Other Topics Concern   Not on file  Social History Narrative   Currently on food stamps. They help somewhat but still difficult to buy food that she needs. Boyfriend helps pay rent with disability, they live together. Roof is leaking really badly. Lives in modular home. Social services bought tarp which helped, but it deteriorated from heat.    Social Determinants of Health   Financial Resource Strain: High Risk (02/12/2018)   Overall Financial Resource Strain (CARDIA)    Difficulty of Paying Living Expenses: Hard  Food Insecurity: No Food Insecurity (10/20/2021)   Hunger Vital Sign    Worried About Running Out of Food in the Last Year: Never  true    Ran Out of Food in the Last Year: Never true  Transportation Needs: No Transportation Needs (10/20/2021)   PRAPARE - Administrator, Civil Service (Medical): No    Lack of Transportation (Non-Medical): No  Physical Activity: Sufficiently Active (11/06/2017)   Exercise Vital Sign    Days of Exercise per Week: 2 days    Minutes of Exercise per Session: 100 min  Stress: Stress Concern Present (11/06/2017)   [employer] of Occupational Health - Occupational Stress Questionnaire    Feeling of Stress : To some extent  Social Connections: Moderately Isolated (11/06/2017)   Social  Connection and Isolation Panel [NHANES]    Frequency of Communication with Friends and Family: More than three times a week    Frequency of Social Gatherings with Friends and Family: Once a week    Attends Religious Services: Never    Database administrator or Organizations: No    Attends Banker Meetings: Never    Marital Status: Divorced  Catering manager Violence: Not At Risk (11/06/2017)   Humiliation, Afraid, Rape, and Kick questionnaire    Fear of Current or Ex-Partner: No    Emotionally Abused: No    Physically Abused: No    Sexually Abused: No    FH:  Family History  Problem Relation Age of Onset   Kidney disease Sister    Diabetes Sister    COPD Sister    Stroke Sister    Thyroid disease Sister    Diabetes Mother    Stroke Mother    Heart attack Mother    Thyroid disease Mother    Colon cancer Mother    Stroke Father    Heart attack Father    Asthma Daughter    Hypertension Daughter    Thyroid disease Daughter    Asthma Son    Heart disease Maternal Grandmother    Hypertension Maternal Grandmother    Colon cancer Maternal Grandfather    Hypertension Maternal Grandfather    Hyperlipidemia Maternal Grandfather    Stroke Paternal Grandmother    Hypertension Paternal Grandfather    Hyperlipidemia Paternal Grandfather    Heart disease Paternal Grandfather    Thyroid disease Sister    Migraines Sister     Past Medical History:  Diagnosis Date   (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 02/2016 Echo: EF 45%; b. 03/2016 Echo: EF 55%; c. 12/2019 Echo: EF 55-60%, no rwma, gr2 DD, nl RV size/fxn, mildly dil RA, mild MR.   Asthma    Chronic chest pain    a. ? vasospasm vs endothelial dysfxn. Improved w/ Ranexa but couldn't afford.   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 Presumed in setting of prior h/o MI w/ insignificant coronary dzs.   Diabetes mellitus without complication (HCC)    Heart murmur    History of NICM  (nonischemic cardiomyopathy) (HCC)    a. 02/2016 Echo: EF 45%; b. 12/2019 Echo: EF 55-60%.   Hyperlipidemia    Hypertension    Myocardial infarction [facility])    Non-obstructive Coronary artery disease    a. Prior caths in 2007, 2011, 2012 - nonobs dzs; b. 03/2016 Cath ([facility]): LM nl, LAD/LCX/RCA insignificant dzs.   Obesity    Rate-dependent LBBB (left bundle branch block)    Remote Tobacco abuse    a. 30+ pack year history - quit 2019.   Seizures (HCC)     Current Outpatient Medications  Medication  Sig Dispense Refill   blood glucose meter kit and supplies KIT Dispense based on patient and insurance preference. Use up to four times daily as directed. (FOR ICD-9 250.00, 250.01). 1 each 0   Blood Pressure Monitoring (BLOOD PRESSURE KIT) KIT 1 kit by Does not apply route daily as needed. 1 kit 0   clopidogrel (PLAVIX) 75 MG tablet Take 1 tablet (75 mg total) by mouth once daily. 90 tablet 3   diphenhydrAMINE (BENADRYL) 50 MG tablet Take 50 mg by mouth 2 (two) times daily.     EPINEPHrine 0.3 mg/0.3 mL IJ SOAJ injection Inject 0.3 into the muscle as needed for anaphylaxis 4 each 6   glucose blood (RIGHTEST GS550 BLOOD GLUCOSE) test strip Use as directed to test blood sugar. 100 each 11   Insulin Pen Needle 32G X 6 MM MISC Use one pen needle with Ozempic once every week 100 each 3   isosorbide mononitrate (IMDUR) 60 MG 24 hr tablet Take 1 tablet (60 mg total) by mouth daily. 90 tablet 3   metoprolol tartrate (LOPRESSOR) 25 MG tablet Take 1 tablet (25 mg total) by mouth 2 (two) times daily. 180 tablet 3   nitroGLYCERIN (NITROSTAT) 0.4 MG SL tablet 1 TABLET UNDER TONGUE AS NEEDED FOR CHEST PAIN EVERY 5 MINUTES FOR MAX OF 3 DOSES IN 15 MINUTES; IF NO RELIEF AFTER 1ST DOSE CALL 911 (Patient not taking: Reported on 11/02/2022) 25 tablet 0   pantoprazole (PROTONIX) 40 MG tablet Take 1 tablet (40 mg total) by mouth once daily. 90 tablet 3   potassium chloride SA (KLOR-CON M) 20 MEQ tablet Take 1 tablet (20  mEq total) by mouth 2 (two) times daily. 180 tablet 3   ranolazine (RANEXA) 500 MG 12 hr tablet Take 1 tablet (500 mg total) by mouth 2 (two) times daily. 180 tablet 3   Rightest GL300 Lancets MISC Use as directed to test blood sugar. 100 each 11   sacubitril-valsartan (ENTRESTO) 24-26 MG Take 1 tablet by mouth 2 (two) times daily. 180 tablet 3   Semaglutide, 1 MG/DOSE, (OZEMPIC, 1 MG/DOSE,) 4 MG/3ML SOPN Inject 1 mg into the skin once a week. Instead of Trulicity 3 mL 6   simvastatin (ZOCOR) 20 MG tablet Take 1 tablet (20 mg total) by mouth once daily. 90 tablet 1   spironolactone (ALDACTONE) 25 MG tablet Take 1 tablet (25 mg total) by mouth daily. 90 tablet 3   torsemide (DEMADEX) 20 MG tablet Take 3 tablets (60 mg total) by mouth daily. 270 tablet 3   No current facility-administered medications for this visit.   Vitals:   12/04/22 1509  BP: (!) 91/56  Pulse: 64  SpO2: 99%  Weight: 281 lb (127.5 kg)  Height: 5\' 7"  (1.702 m)   Wt Readings from Last 3 Encounters:  12/04/22 281 lb (127.5 kg)  11/02/22 282 lb (127.9 kg)  10/26/22 282 lb 13.6 oz (128.3 kg)   Lab Results  Component Value Date   CREATININE 0.97 09/27/2022   CREATININE 0.93 08/09/2022   CREATININE 0.87 06/23/2022   PHYSICAL EXAM:  General:  Well appearing. No resp difficulty HEENT: normal Neck: supple. JVP flat. No lymphadenopathy or thryomegaly appreciated. Cor: PMI normal. Regular rate & rhythm. No rubs, gallops or murmurs. Lungs: clear Abdomen: soft, nontender, nondistended. No hepatosplenomegaly. No bruits or masses.  Extremities: no cyanosis, clubbing, rash, trace pitting edema bilaterally Neuro: alert & oriented x3, cranial nerves grossly intact. Moves all 4 extremities w/o difficulty. Affect pleasant.  ECG: NSR with LBBB (unchanged from previous); reviewed personally  Reds: 37%   ASSESSMENT & PLAN:  1: Chronic heart failure with mildly reduced ejection fraction with LVH- - etiology likely LBBB as  cath showed nonobstructive CAD - NYHA class II - euvolemic today  - weighing daily & home weight chart shows 280-284 pounds; reminded to call for an overnight weight gain of >2 pounds or a weekly weight gain of >5 pounds - weight unchanged from last visit here 1 month ago - Reds reading slightly elevated at 37% although do not have a baseline for comparison - Echo 02/27/17: EF of 55-60%. - Echo 01/06/20: EF of 55-60% along with mild LVH and mild MR - Echo 06/14/20: EF of 50-55% along with mild LVH.  - echo 06/08/22: EF 40-45% along with mild/moderate MR.  - has been following a daily 2L fluid intake along with closely following a 2000mg  sodium diet - continue spironolactone 25mg  daily - continue torsemide 60mg  daily/ potassium BID - continue entresto 24/26mg  BID - jardiance caused HA, nausea and increased thirst so this was stopped - BNP 06/12/20 was 45.8  2: HTN- - BP 91/56; home BP running 98-117/57-68 - saw PCP (llobachie NP) 07/24 - BMP 09/27/22 reviewed and showed sodium 138, potassium 3.7, creatinine 0.97 and GFR >60  3: DM- - A1c 07/26/22 was 5.6% - ozempic 1mg  weekly - home glucose running 90-102 - has endocrinology appt 10/24  4: Nonobstructive CAD- - saw cardiology Fransico Michael) 07/24 - continue plavix 75mg  daily - continue isosorbide MN 60mg  daily  - continue metoprolol tartrate 25mg  BID - continue ranexa 500mg  BID - RHC/LHC 07/04/22:   There is mild left ventricular systolic dysfunction.   LV end diastolic pressure is normal.   The left ventricular ejection fraction is 50-55% by visual estimate.  1.  Minimal nonobstructive coronary artery disease. 2.  Mildly reduced LV systolic function with an EF of 50%. 3.  Right heart catheterization showed normal filling pressures, normal pulmonary pressure and normal cardiac output.  5: Palpitations- - EKG today showed NSR with chronic LBBB - 14 day zio applied to see if any arrhthymias are going on  Return in 1 month, sooner if  needed.

## 2022-12-07 ENCOUNTER — Other Ambulatory Visit: Payer: Self-pay

## 2022-12-12 ENCOUNTER — Ambulatory Visit: Payer: Self-pay | Admitting: Rheumatology

## 2022-12-12 ENCOUNTER — Ambulatory Visit: Payer: Self-pay

## 2022-12-12 VITALS — BP 106/73 | HR 67 | Wt 279.6 lb

## 2022-12-12 DIAGNOSIS — M1712 Unilateral primary osteoarthritis, left knee: Secondary | ICD-10-CM

## 2022-12-12 NOTE — Patient Instructions (Signed)
Left knee injected. Will help Medicaid application  then orthopedic referral. No further injections

## 2022-12-12 NOTE — Progress Notes (Signed)
[facility]  PROGRESS NOTE  Patient:Kathy [name] Female   DOB:1966-01-05     57 y.o.  YNW:[id number]  Visit Date: 12/12/2022  HPI:  57 year old white female.  Previously applied for disability.  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  History congestive heart failure.  Last hospitalization 2022 Known osteoarthritis.  Prior injections.  Prior ACL repair on the left.  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 02/2016 Echo: EF 45%; b. 03/2016 Echo: EF 55%; c. 12/2019 Echo: EF 55-60%, no rwma, gr2 DD, nl RV size/fxn, mildly dil RA, mild MR.   Asthma    Chronic chest pain    a. ? vasospasm vs endothelial dysfxn. Improved w/ Ranexa but couldn't afford.   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 Presumed in setting of prior h/o MI w/ insignificant coronary dzs.   Diabetes mellitus without complication (HCC)    Heart murmur    History of NICM (nonischemic cardiomyopathy) (HCC)    a. 02/2016 Echo: EF 45%; b. 12/2019 Echo: EF 55-60%.   Hyperlipidemia    Hypertension    Myocardial infarction [facility])    Non-obstructive Coronary artery disease    a. Prior caths in 2007, 2011, 2012 - nonobs dzs; b. 03/2016 Cath ([facility]): LM nl, LAD/LCX/RCA insignificant dzs.   Obesity    Rate-dependent LBBB (left bundle branch block)    Remote Tobacco abuse    a. 30+ pack year history - quit 2019.   Seizures (HCC)     Past Surgical History:  Procedure Laterality Date   ANTERIOR CRUCIATE LIGAMENT REPAIR     BACK SURGERY     L4 and L5   CARDIAC CATHETERIZATION  04/09/2016   COLONOSCOPY     COLONOSCOPY WITH PROPOFOL N/A 08/04/2019   Procedure: COLONOSCOPY WITH PROPOFOL;  Surgeon: Toney Reil, MD;  Location: [facility] ENDOSCOPY;  Service: Gastroenterology;  Laterality: N/A;    EMPYEMA DRAINAGE     at age 79   RIGHT/LEFT HEART CATH AND CORONARY ANGIOGRAPHY Bilateral 07/04/2022   Procedure: RIGHT/LEFT HEART CATH AND CORONARY ANGIOGRAPHY;  Surgeon: Iran Ouch, MD;  Location: [facility] INVASIVE CV LAB;  Service: Cardiovascular;  Laterality: Bilateral;   TONSILLECTOMY     TONSILLECTOMY AND ADENOIDECTOMY  2008   TUBAL LIGATION      Social History   Tobacco Use   Smoking status: Former    Current packs/day: 0.00    Average packs/day: 1 pack/day for 30.0 years (30.0 ttl pk-yrs)    Types: Cigarettes    Start date: 08/17/1987    Quit date: 08/16/2017    Years since quitting: 5.3   Smokeless tobacco: Never  Substance Use Topics   Alcohol use: Not Currently    Comment: last use 01/2022     MEDICATIONS: Current Outpatient Medications  Medication Sig Dispense Refill   blood glucose meter kit and supplies KIT Dispense based on patient and insurance preference. Use up to four times daily as directed. (FOR ICD-9 250.00, 250.01). 1 each 0   Blood Pressure Monitoring (BLOOD PRESSURE KIT) KIT 1 kit by Does not apply route daily as needed. 1 kit 0   clopidogrel (PLAVIX) 75 MG tablet Take 1 tablet (75 mg total) by mouth once daily. 90 tablet 3  diphenhydrAMINE (BENADRYL) 50 MG tablet Take 50 mg by mouth 2 (two) times daily.     EPINEPHrine 0.3 mg/0.3 mL IJ SOAJ injection Inject 0.3 into the muscle as needed for anaphylaxis 4 each 6   glucose blood (RIGHTEST GS550 BLOOD GLUCOSE) test strip Use as directed to test blood sugar. 100 each 11   Insulin Pen Needle 32G X 6 MM MISC Use one pen needle with Ozempic once every week 100 each 3   isosorbide mononitrate (IMDUR) 60 MG 24 hr tablet Take 1 tablet (60 mg total) by mouth daily. 90 tablet 3   metoprolol tartrate (LOPRESSOR) 25 MG tablet Take 1 tablet (25 mg total) by mouth 2 (two) times daily. 180 tablet 3   nitroGLYCERIN (NITROSTAT) 0.4 MG SL tablet 1 TABLET UNDER TONGUE AS NEEDED FOR CHEST PAIN EVERY 5 MINUTES FOR MAX OF 3  DOSES IN 15 MINUTES; IF NO RELIEF AFTER 1ST DOSE CALL 911 (Patient not taking: Reported on 11/02/2022) 25 tablet 0   pantoprazole (PROTONIX) 40 MG tablet Take 1 tablet (40 mg total) by mouth once daily. 90 tablet 3   potassium chloride SA (KLOR-CON M) 20 MEQ tablet Take 1 tablet (20 mEq total) by mouth 2 (two) times daily. 180 tablet 3   ranolazine (RANEXA) 500 MG 12 hr tablet Take 1 tablet (500 mg total) by mouth 2 (two) times daily. 180 tablet 3   Rightest GL300 Lancets MISC Use as directed to test blood sugar. 100 each 11   sacubitril-valsartan (ENTRESTO) 24-26 MG Take 1 tablet by mouth 2 (two) times daily. 180 tablet 3   Semaglutide, 1 MG/DOSE, (OZEMPIC, 1 MG/DOSE,) 4 MG/3ML SOPN Inject 1 mg into the skin once a week. Instead of Trulicity 3 mL 6   simvastatin (ZOCOR) 20 MG tablet Take 1 tablet (20 mg total) by mouth once daily. 90 tablet 1   spironolactone (ALDACTONE) 25 MG tablet Take 1 tablet (25 mg total) by mouth daily. 90 tablet 3   torsemide (DEMADEX) 20 MG tablet Take 3 tablets (60 mg total) by mouth daily. 270 tablet 3   No current facility-administered medications for this visit.     ALLERGIES Allergies  Allergen Reactions   Aspirin Anaphylaxis, Hives and Swelling    Occurred as a child Occurred as a child   Beef Allergy Hives   Celebrex [Celecoxib] Anaphylaxis and Hives   Fentanyl Anaphylaxis   Fluticasone Propionate Other (See Comments) and Swelling    Swelling, pain, and itching   Gabapentin Shortness Of Breath   Hydrocodone-Acetaminophen Hives, Other (See Comments) and Swelling    Feels like her tongue swells. Trouble swallowing   Latex Hives   Lisinopril Shortness Of Breath   Morphine Anaphylaxis and Swelling   Oxycodone-Acetaminophen Hives and Shortness Of Breath   Percocet [Oxycodone-Acetaminophen] Hives and Shortness Of Breath   Pork Allergy Hives   Simvastatin Hives and Shortness Of Breath    Can tolerate 20 mg dose.   Skelaxin [Metaxalone] Anaphylaxis    Beef-Derived Products Hives   Pork-Derived Products Hives   Vicodin [Hydrocodone-Acetaminophen] Hives and Other (See Comments)    Feels like her tongue swells. Trouble swallowing   Albuterol Palpitations   Jardiance [Empagliflozin] Nausea Only and Palpitations     PHYSICAL EXAM: Last menstrual period 06/09/2018. Both shoulders move well.  Hands without significant synovitis.  Mild laxity left knee flexes reasonably.  No definitive effusion.  Right knee pain with flexion.  No palpable Baker's cyst   ASSESSMENT: Impressions bilateral knee osteoarthritis History  of rotator cuff disease Congestive heart failure Obesity    PLAN: Procedure: Left knee prepped sterile manner.  Aspirate dry.  Injected with 2 cc Xylocaine 1 cc Medrol Discussed with her that we cannot continue to give steroid injections. Our staff will assist her and applying for Medicaid and then consider follow-up with orthopedics.  Will eventually need knee replacement    G. Al Pimple. MD           12/12/2022,  11:35 AM

## 2022-12-13 ENCOUNTER — Other Ambulatory Visit: Payer: Self-pay

## 2022-12-20 ENCOUNTER — Other Ambulatory Visit: Payer: Self-pay

## 2022-12-21 ENCOUNTER — Other Ambulatory Visit: Payer: Self-pay

## 2022-12-26 ENCOUNTER — Ambulatory Visit: Payer: Self-pay | Admitting: Gerontology

## 2022-12-26 ENCOUNTER — Other Ambulatory Visit: Payer: Self-pay

## 2022-12-26 ENCOUNTER — Encounter: Payer: Self-pay | Admitting: Gerontology

## 2022-12-26 VITALS — BP 121/84 | HR 73 | Resp 16 | Wt 278.8 lb

## 2022-12-26 DIAGNOSIS — R1031 Right lower quadrant pain: Secondary | ICD-10-CM | POA: Insufficient documentation

## 2022-12-26 DIAGNOSIS — E782 Mixed hyperlipidemia: Secondary | ICD-10-CM

## 2022-12-26 DIAGNOSIS — Z0000 Encounter for general adult medical examination without abnormal findings: Secondary | ICD-10-CM

## 2022-12-26 DIAGNOSIS — G8929 Other chronic pain: Secondary | ICD-10-CM

## 2022-12-26 MED ORDER — OZEMPIC (1 MG/DOSE) 4 MG/3ML ~~LOC~~ SOPN
1.0000 mg | PEN_INJECTOR | SUBCUTANEOUS | 3 refills | Status: DC
  Filled 2023-02-02: qty 12, 112d supply, fill #0

## 2022-12-26 NOTE — Patient Instructions (Signed)
Stretching Exercises for Athletes Stretching exercises can help athletes prevent injuries. These should be done as part of your training program and to prepare your body for an athletic activity. Ask your health care provider which exercises are safe for you. Do exercises exactly as told by your health care provider and adjust them as directed. It is normal to feel mild stretching, pulling, tightness, or discomfort as you do these exercises. Stop right away if you feel sudden pain or your pain gets worse. Do not begin these exercises until told by your health care provider. Stretching Stretching before being active gets your muscles ready to move. Stretching after your athletic activity is even more important for reducing your risk of injury. Be sure to stretch all the muscle groups in your lower body. Here are some general tips when stretching: Always warm up before stretching. Try running in place, jogging slowly, or walking briskly. Warm up gradually over 3-5 minutes. Do not rush stretching exercises. This can cause injury. Do not force a stretch or bounce while stretching. This can cause injury. Stretch slowly and smoothly. Stop stretching if you have pain. After any injury, check with your health care provider, physical therapist, or athletic trainer before going back to your stretching exercises. Exercises Try these lower body stretching exercises. Your physical therapist or athletic trainer may suggest others. Hold each stretch for 15-30 seconds and repeat 3-6 times. Side-to-side lunges  Stand with your legs spread wide apart. Turn your feet slightly outward. Keeping your back straight, bend one knee. As you bend your knee, keep your other leg straight. Lean your body toward the bent knee to obtain a greater stretch. Repeat the exercise with the other leg. Forward lower body lunges  Kneel down on one knee. Place the other leg out in front of you so your knee is directly above your  ankle. Lean forward and straighten out the kneeling leg. As you lunge forward, you should feel the stretch in your groin area. Repeat the exercise on the other side. Straight leg crossovers Stand with your legs crossed and your feet close together. Keeping your legs straight, bend over and reach for your toes. You should feel the stretch in both legs. Recross your legs the opposite way and repeat. Standing backward leg stretch  Support your weight on a wall or chair with one hand. Reach back and grab your foot with the other hand. Slowly bend your foot back toward your buttocks, keeping your knees close together side by side. Switch hands and repeat the stretch with the opposite foot. Seated lotus position stretch  Sit on the ground with your knees out wide and the soles of your feet touching (the lotus position). Lean forward, placing your forearms over the inside of your knees. Press your knees toward the ground and lean forward from the waist. Seated side straddle stretch Sit on the ground with your legs straight and spread them wide apart. Lean forward toward one knee. Place both hands on your shin or ankle to draw your chin down toward the knee. Keep your leg and back straight as you lean forward. Repeat the exercise on the other side. Seated forward stretch  Sit with your legs straight out in front of you, knees close together. Grab your shins or ankles and pull your chin down toward your knees. Keep your legs and back straight as you lean forward. Knees to chest rocking stretch Lie on your back with your knees bent. Grab your knees with your  hands, and pull them up and out toward your armpits. Gently rock from side to side while you hold the stretch. Contact a health care provider if: You have pain while doing your exercises. You have severe pain or soreness after doing your exercises. This information is not intended to replace advice given to you by your health care  provider. Make sure you discuss any questions you have with your health care provider. Document Revised: 07/20/2020 Document Reviewed: 07/20/2020 Elsevier Patient Education  2024 [employer] Calorie Counting for [employer] Calories are units of energy. Your body needs a certain number of calories from food to keep going throughout the day. When you eat or drink more calories than your body needs, your body stores the extra calories mostly as fat. When you eat or drink fewer calories than your body needs, your body burns fat to get the energy it needs. Calorie counting means keeping track of how many calories you eat and drink each day. Calorie counting can be helpful if you need to lose weight. If you eat fewer calories than your body needs, you should lose weight. Ask your health care provider what a healthy weight is for you. For calorie counting to work, you will need to eat the right number of calories each day to lose a healthy amount of weight per week. A dietitian can help you figure out how many calories you need in a day and will suggest ways to reach your calorie goal. A healthy amount of weight to lose each week is usually 1-2 lb (0.5-0.9 kg). This usually means that your daily calorie intake should be reduced by 500-750 calories. Eating 1,200-1,500 calories a day can help most women lose weight. Eating 1,500-1,800 calories a day can help most men lose weight. What do I need to know about calorie counting? Work with your health care provider or dietitian to determine how many calories you should get each day. To meet your daily calorie goal, you will need to: Find out how many calories are in each food that you would like to eat. Try to do this before you eat. Decide how much of the food you plan to eat. Keep a food log. Do this by writing down what you ate and how many calories it had. To successfully lose weight, it is important to balance calorie counting with a healthy lifestyle  that includes regular activity. Where do I find calorie information?  The number of calories in a food can be found on a Nutrition Facts label. If a food does not have a Nutrition Facts label, try to look up the calories online or ask your dietitian for help. Remember that calories are listed per serving. If you choose to have more than one serving of a food, you will have to multiply the calories per serving by the number of servings you plan to eat. For example, the label on a package of bread might say that a serving size is 1 slice and that there are 90 calories in a serving. If you eat 1 slice, you will have eaten 90 calories. If you eat 2 slices, you will have eaten 180 calories. How do I keep a food log? After each time that you eat, record the following in your food log as soon as possible: What you ate. Be sure to include toppings, sauces, and other extras on the food. How much you ate. This can be measured in cups, ounces, or number of items. How many  calories were in each food and drink. The total number of calories in the food you ate. Keep your food log near you, such as in a pocket-sized notebook or on an app or website on your mobile phone. Some programs will calculate calories for you and show you how many calories you have left to meet your daily goal. What are some portion-control tips? Know how many calories are in a serving. This will help you know how many servings you can have of a certain food. Use a measuring cup to measure serving sizes. You could also try weighing out portions on a kitchen scale. With time, you will be able to estimate serving sizes for some foods. Take time to put servings of different foods on your favorite plates or in your favorite bowls and cups so you know what a serving looks like. Try not to eat straight from a food's packaging, such as from a bag or box. Eating straight from the package makes it hard to see how much you are eating and can lead to  overeating. Put the amount you would like to eat in a cup or on a plate to make sure you are eating the right portion. Use smaller plates, glasses, and bowls for smaller portions and to prevent overeating. Try not to multitask. For example, avoid watching TV or using your computer while eating. If it is time to eat, sit down at a table and enjoy your food. This will help you recognize when you are full. It will also help you be more mindful of what and how much you are eating. What are tips for following this plan? Reading food labels Check the calorie count compared with the serving size. The serving size may be smaller than what you are used to eating. Check the source of the calories. Try to choose foods that are high in protein, fiber, and vitamins, and low in saturated fat, trans fat, and sodium. Shopping Read nutrition labels while you shop. This will help you make healthy decisions about which foods to buy. Pay attention to nutrition labels for low-fat or fat-free foods. These foods sometimes have the same number of calories or more calories than the full-fat versions. They also often have added sugar, starch, or salt to make up for flavor that was removed with the fat. Make a grocery list of lower-calorie foods and stick to it. Cooking Try to cook your favorite foods in a healthier way. For example, try baking instead of frying. Use low-fat dairy products. Meal planning Use more fruits and vegetables. One-half of your plate should be fruits and vegetables. Include lean proteins, such as chicken, [place], and fish. Lifestyle Each week, aim to do one of the following: 150 minutes of moderate exercise, such as walking. 75 minutes of vigorous exercise, such as running. General information Know how many calories are in the foods you eat most often. This will help you calculate calorie counts faster. Find a way of tracking calories that works for you. Get creative. Try different apps or  programs if writing down calories does not work for you. What foods should I eat?  Eat nutritious foods. It is better to have a nutritious, high-calorie food, such as an avocado, than a food with few nutrients, such as a bag of potato chips. Use your calories on foods and drinks that will fill you up and will not leave you hungry soon after eating. Examples of foods that fill you up are nuts and nut  butters, vegetables, lean proteins, and high-fiber foods such as whole grains. High-fiber foods are foods with more than 5 g of fiber per serving. Pay attention to calories in drinks. Low-calorie drinks include water and unsweetened drinks. The items listed above may not be a complete list of foods and beverages you can eat. Contact a dietitian for more information. What foods should I limit? Limit foods or drinks that are not good sources of vitamins, minerals, or protein or that are high in unhealthy fats. These include: Candy. Other sweets. Sodas, specialty coffee drinks, alcohol, and juice. The items listed above may not be a complete list of foods and beverages you should avoid. Contact a dietitian for more information. How do I count calories when eating out? Pay attention to portions. Often, portions are much larger when eating out. Try these tips to keep portions smaller: Consider sharing a meal instead of getting your own. If you get your own meal, eat only half of it. Before you start eating, ask for a container and put half of your meal into it. When available, consider ordering smaller portions from the menu instead of full portions. Pay attention to your food and drink choices. Knowing the way food is cooked and what is included with the meal can help you eat fewer calories. If calories are listed on the menu, choose the lower-calorie options. Choose dishes that include vegetables, fruits, whole grains, low-fat dairy products, and lean proteins. Choose items that are boiled, broiled,  grilled, or steamed. Avoid items that are buttered, battered, fried, or served with cream sauce. Items labeled as crispy are usually fried, unless stated otherwise. Choose water, low-fat milk, unsweetened iced tea, or other drinks without added sugar. If you want an alcoholic beverage, choose a lower-calorie option, such as a glass of wine or light beer. Ask for dressings, sauces, and syrups on the side. These are usually high in calories, so you should limit the amount you eat. If you want a salad, choose a garden salad and ask for grilled meats. Avoid extra toppings such as bacon, cheese, or fried items. Ask for the dressing on the side, or ask for olive oil and vinegar or lemon to use as dressing. Estimate how many servings of a food you are given. Knowing serving sizes will help you be aware of how much food you are eating at restaurants. Where to find more information Centers for Disease Control and Prevention: [employer]Exhibition.com.br U.[place]. Department of Agriculture: Wrestling[employer].dk Summary Calorie counting means keeping track of how many calories you eat and drink each day. If you eat fewer calories than your body needs, you should lose weight. A healthy amount of weight to lose per week is usually 1-2 lb (0.5-0.9 kg). This usually means reducing your daily calorie intake by 500-750 calories. The number of calories in a food can be found on a Nutrition Facts label. If a food does not have a Nutrition Facts label, try to look up the calories online or ask your dietitian for help. Use smaller plates, glasses, and bowls for smaller portions and to prevent overeating. Use your calories on foods and drinks that will fill you up and not leave you hungry shortly after a meal. This information is not intended to replace advice given to you by your health care provider. Make sure you discuss any questions you have with your health care provider. Document Revised: 05/08/2019 Document Reviewed: 05/08/2019 Elsevier Patient  Education  2023 [employer].

## 2022-12-26 NOTE — Progress Notes (Signed)
Established Patient Office Visit  Subjective   Patient ID: Kathy [name], female    DOB: 12-03-1965  Age: 57 y.o. MRN: [id number]  No chief complaint on file.   HPI Kathy [name]  is a 57 y/o female who has history of Heart failure, Asthma, Hyperlipidemia, Heart Murmur, Hypertension, who presents for follow up visit.  She was seen by  Dr. Gavin Potters on 12/12/2022 for her chronic bilateral knee pain. She received  2 cc Xylocaine 1 cc Medrol  to her Left knee, but she reports no relief .  She was also seen at the  Heart Failure clinic on 12/04/2022 by Clarisa Kindred FNP-C for her follow up visit and was hooked to 14 day zio  heart monitor for 14 days to monitor for any arrhythmias.  She denies chest pain, palpitation, and shortness of breath. Currently, she c/o of  non traumatic, non radiating , intermittent sharp non radiating pain to her right groin. She states that pain intensity increases up to 8/10 with activities such as walking or performing household chores. She reports that resting relieves the pain.  She reports that started about a week and half ago. She denies muscle/ motor weakness nor paresthesia. Overall, she states that she is doing well and offers no further complaint.   Patient Active Problem List   Diagnosis Date Noted   Groin pain, right 12/26/2022   Chronic systolic heart failure (HCC) 07/04/2022   Knee pain, chronic 07/21/2021   Elevated liver enzymes 12/21/2020   Anxiety due to invasive procedure 09/22/2020   Hospital discharge follow-up 06/15/2020   Acute on chronic diastolic CHF (congestive heart failure) (HCC) 06/12/2020   History of left bundle branch block (LBBB) 06/12/2020   Morbid obesity with BMI of 50.0-59.9, adult (HCC) 06/12/2020   CHF (congestive heart failure) (HCC) 06/12/2020   Healthcare maintenance 05/19/2020   Edema of both lower extremities 03/02/2020   Shoulder pain, left 01/01/2020   Pain of left calf 01/01/2020   Hearing loss 01/01/2020   Type 2  diabetes mellitus without complications (HCC) 12/04/2019   Rash 12/04/2019   History of CHF (congestive heart failure) 06/12/2019   Cough 03/30/2016   Chest pain 02/24/2016   New left bundle branch block (LBBB) 11/05/2015   Elevated lymphocytes 06/03/2015   History of MI (myocardial infarction) 06/03/2015   Tobacco abuse 06/03/2015   Obesity 06/03/2015   Seizures (HCC) 11/26/2014   Hypertension 05/21/2014   CAD (coronary artery disease) 05/21/2014   Hyperlipidemia 05/21/2014   Past Medical History:  Diagnosis Date   (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 02/2016 Echo: EF 45%; b. 03/2016 Echo: EF 55%; c. 12/2019 Echo: EF 55-60%, no rwma, gr2 DD, nl RV size/fxn, mildly dil RA, mild MR.   Asthma    Chronic chest pain    a. ? vasospasm vs endothelial dysfxn. Improved w/ Ranexa but couldn't afford.   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 Presumed in setting of prior h/o MI w/ insignificant coronary dzs.   Diabetes mellitus without complication (HCC)    Heart murmur    History of NICM (nonischemic cardiomyopathy) (HCC)    a. 02/2016 Echo: EF 45%; b. 12/2019 Echo: EF 55-60%.   Hyperlipidemia    Hypertension    Myocardial infarction [facility])    Non-obstructive Coronary artery disease    a. Prior caths in 2007, 2011, 2012 - nonobs dzs; b. 03/2016 Cath ([facility]): LM nl, LAD/LCX/RCA insignificant dzs.   Obesity  Rate-dependent LBBB (left bundle branch block)    Remote Tobacco abuse    a. 30+ pack year history - quit 2019.   Seizures (HCC)    Past Surgical History:  Procedure Laterality Date   ANTERIOR CRUCIATE LIGAMENT REPAIR     BACK SURGERY     L4 and L5   CARDIAC CATHETERIZATION  04/09/2016   COLONOSCOPY     COLONOSCOPY WITH PROPOFOL N/A 08/04/2019   Procedure: COLONOSCOPY WITH PROPOFOL;  Surgeon: Toney Reil, MD;  Location: [facility] ENDOSCOPY;  Service: Gastroenterology;  Laterality: N/A;   EMPYEMA DRAINAGE     at age 46   RIGHT/LEFT HEART CATH AND  CORONARY ANGIOGRAPHY Bilateral 07/04/2022   Procedure: RIGHT/LEFT HEART CATH AND CORONARY ANGIOGRAPHY;  Surgeon: Iran Ouch, MD;  Location: [facility] INVASIVE CV LAB;  Service: Cardiovascular;  Laterality: Bilateral;   TONSILLECTOMY     TONSILLECTOMY AND ADENOIDECTOMY  2008   TUBAL LIGATION     Social History   Tobacco Use   Smoking status: Former    Current packs/day: 0.00    Average packs/day: 1 pack/day for 30.0 years (30.0 ttl pk-yrs)    Types: Cigarettes    Start date: 08/17/1987    Quit date: 08/16/2017    Years since quitting: 5.3   Smokeless tobacco: Never  Vaping Use   Vaping status: Never Used  Substance Use Topics   Alcohol use: Not Currently    Comment: last use 01/2022   Drug use: Not Currently    Comment: Previously used cocaine in the 1990s   Social History   Socioeconomic History   Marital status: Divorced    Spouse name: Not on file   Number of children: 2   Years of education: pre-requisites    Highest education level: GED or equivalent  Occupational History   Occupation: unemployed  Tobacco Use   Smoking status: Former    Current packs/day: 0.00    Average packs/day: 1 pack/day for 30.0 years (30.0 ttl pk-yrs)    Types: Cigarettes    Start date: 08/17/1987    Quit date: 08/16/2017    Years since quitting: 5.3   Smokeless tobacco: Never  Vaping Use   Vaping status: Never Used  Substance and Sexual Activity   Alcohol use: Not Currently    Comment: last use 01/2022   Drug use: Not Currently    Comment: Previously used cocaine in the 1990s   Sexual activity: Yes    Birth control/protection: Condom  Other Topics Concern   Not on file  Social History Narrative   Currently on food stamps. They help somewhat but still difficult to buy food that she needs. Boyfriend helps pay rent with disability, they live together. Roof is leaking really badly. Lives in modular home. Social services bought tarp which helped, but it deteriorated from heat.    Social  Determinants of Health   Financial Resource Strain: High Risk (02/12/2018)   Overall Financial Resource Strain (CARDIA)    Difficulty of Paying Living Expenses: Hard  Food Insecurity: No Food Insecurity (10/20/2021)   Hunger Vital Sign    Worried About Running Out of Food in the Last Year: Never true    Ran Out of Food in the Last Year: Never true  Transportation Needs: No Transportation Needs (10/20/2021)   PRAPARE - Administrator, Civil Service (Medical): No    Lack of Transportation (Non-Medical): No  Physical Activity: Sufficiently Active (11/06/2017)   Exercise Vital Sign    Days  of Exercise per Week: 2 days    Minutes of Exercise per Session: 100 min  Stress: Stress Concern Present (11/06/2017)   [employer] of Occupational Health - Occupational Stress Questionnaire    Feeling of Stress : To some extent  Social Connections: Moderately Isolated (11/06/2017)   Social Connection and Isolation Panel [NHANES]    Frequency of Communication with Friends and Family: More than three times a week    Frequency of Social Gatherings with Friends and Family: Once a week    Attends Religious Services: Never    Database administrator or Organizations: No    Attends Banker Meetings: Never    Marital Status: Divorced  Catering manager Violence: Not At Risk (11/06/2017)   Humiliation, Afraid, Rape, and Kick questionnaire    Fear of Current or Ex-Partner: No    Emotionally Abused: No    Physically Abused: No    Sexually Abused: No   Family Status  Relation Name Status   Sister  Deceased   Mother  Deceased   Father  Deceased   Daughter  Alive   Son  Alive   MGM  Deceased   MGF  Deceased   PGM  Deceased   PGF  Deceased   Sister  Alive  No partnership data on file   Family History  Problem Relation Age of Onset   Kidney disease Sister    Diabetes Sister    COPD Sister    Stroke Sister    Thyroid disease Sister    Diabetes Mother    Stroke Mother     Heart attack Mother    Thyroid disease Mother    Colon cancer Mother    Stroke Father    Heart attack Father    Asthma Daughter    Hypertension Daughter    Thyroid disease Daughter    Asthma Son    Heart disease Maternal Grandmother    Hypertension Maternal Grandmother    Colon cancer Maternal Grandfather    Hypertension Maternal Grandfather    Hyperlipidemia Maternal Grandfather    Stroke Paternal Grandmother    Hypertension Paternal Grandfather    Hyperlipidemia Paternal Grandfather    Heart disease Paternal Grandfather    Thyroid disease Sister    Migraines Sister    Allergies  Allergen Reactions   Aspirin Anaphylaxis, Hives and Swelling    Occurred as a child Occurred as a child   Beef Allergy Hives   Celebrex [Celecoxib] Anaphylaxis and Hives   Fentanyl Anaphylaxis   Fluticasone Propionate Other (See Comments) and Swelling    Swelling, pain, and itching   Gabapentin Shortness Of Breath   Hydrocodone-Acetaminophen Hives, Other (See Comments) and Swelling    Feels like her tongue swells. Trouble swallowing   Latex Hives   Lisinopril Shortness Of Breath   Morphine Anaphylaxis and Swelling   Oxycodone-Acetaminophen Hives and Shortness Of Breath   Percocet [Oxycodone-Acetaminophen] Hives and Shortness Of Breath   Pork Allergy Hives   Simvastatin Hives and Shortness Of Breath    Can tolerate 20 mg dose.   Skelaxin [Metaxalone] Anaphylaxis   Beef-Derived Products Hives   Pork-Derived Products Hives   Vicodin [Hydrocodone-Acetaminophen] Hives and Other (See Comments)    Feels like her tongue swells. Trouble swallowing   Albuterol Palpitations   Jardiance [Empagliflozin] Nausea Only and Palpitations      Review of Systems  Constitutional: Negative.   HENT: Negative.    Eyes: Negative.   Respiratory: Negative.  Cardiovascular: Negative.   Gastrointestinal: Negative.   Genitourinary: Negative.   Musculoskeletal: Negative.  Negative for joint pain.  Skin:  Negative.   Neurological: Negative.   Psychiatric/Behavioral: Negative.        Objective:     BP 121/84 (BP Location: Right Arm, Patient Position: Sitting, Cuff Size: Large)   Pulse 73   Resp 16   Wt 278 lb 12.8 oz (126.5 kg)   LMP 06/09/2018   SpO2 97%   BMI 43.67 kg/m  BP Readings from Last 3 Encounters:  12/26/22 121/84  12/12/22 106/73  12/04/22 (!) 91/56   Wt Readings from Last 3 Encounters:  12/26/22 278 lb 12.8 oz (126.5 kg)  12/12/22 279 lb 9.6 oz (126.8 kg)  12/04/22 281 lb (127.5 kg)      Physical Exam HENT:     Head: Normocephalic and atraumatic.     Mouth/Throat:     Mouth: Mucous membranes are moist.  Eyes:     Pupils: Pupils are equal, round, and reactive to light.  Cardiovascular:     Rate and Rhythm: Normal rate and regular rhythm.     Pulses: Normal pulses.     Heart sounds: Normal heart sounds. No murmur heard. Pulmonary:     Effort: Pulmonary effort is normal.     Breath sounds: Normal breath sounds.  Musculoskeletal:        General: No signs of injury. Normal range of motion.  Skin:    General: Skin is warm.  Neurological:     General: No focal deficit present.     Mental Status: She is alert and oriented to person, place, and time.  Psychiatric:        Mood and Affect: Mood normal.        Behavior: Behavior normal.        Thought Content: Thought content normal.        Judgment: Judgment normal.      No results found for any visits on 12/26/22.  Last CBC Lab Results  Component Value Date   WBC 7.8 06/23/2022   HGB 13.6 07/04/2022   HCT 40.0 07/04/2022   MCV 87.2 06/23/2022   MCH 28.3 06/23/2022   RDW 14.2 06/23/2022   PLT 282 06/23/2022   Last metabolic panel Lab Results  Component Value Date   GLUCOSE 106 (H) 09/27/2022   NA 138 09/27/2022   K 3.7 09/27/2022   CL 100 09/27/2022   CO2 26 09/27/2022   BUN 14 09/27/2022   CREATININE 0.97 09/27/2022   GFRNONAA >60 09/27/2022   CALCIUM 9.0 09/27/2022   PHOS 4.0  06/21/2020   PROT 7.1 01/18/2022   ALBUMIN 4.4 01/18/2022   LABGLOB 2.7 01/18/2022   AGRATIO 1.6 01/18/2022   BILITOT 0.3 01/18/2022   ALKPHOS 123 (H) 01/18/2022   AST 15 01/18/2022   ALT 22 01/18/2022   ANIONGAP 12 09/27/2022   Last lipids Lab Results  Component Value Date   CHOL 159 01/18/2022   HDL 32 (L) 01/18/2022   LDLCALC 93 01/18/2022   TRIG 198 (H) 01/18/2022   CHOLHDL 5.0 (H) 01/18/2022   Last hemoglobin A1c Lab Results  Component Value Date   HGBA1C 5.6 07/26/2022   Last thyroid functions Lab Results  Component Value Date   TSH 1.770 10/04/2017   Last vitamin D No results found for: "25OHVITD2", "25OHVITD3", "VD25OH" Last vitamin B12 and Folate No results found for: "VITAMINB12", "FOLATE"    The 10-year ASCVD risk score (Arnett DK, et al.,  2019) is: 6.5%    Assessment & Plan:  1. Mixed hyperlipidemia The 10-year ASCVD risk score (Arnett DK, et al., 2019) is: 6.5%   Values used to calculate the score:     Age: 78 years     Sex: Female     Is Non-Hispanic African American: No     Diabetic: Yes     Tobacco smoker: No     Systolic Blood Pressure: 121 mmHg     Is BP treated: Yes     HDL Cholesterol: 32 mg/dL     Total Cholesterol: 159 mg/dL  She will continue current medication, low fat/cholesterol diet and exercise as tolerated. 2. Groin pain, right - Unknown etiology of groin pain, was advised to perform some stretching exercise, apply heat/warm compress and go to the ED for worsening symptoms.  3. Class 3 severe obesity with body mass index (BMI) of 50.0 to 59.9 in adult, unspecified obesity type, unspecified whether serious comorbidity present (HCC) - Encouraged her on healthy eating habits and to consume small incremented meals to assist with cravings and improve her eating habits. Will provider low carbohydrate diet and DASH diet to assist with guideline to improve pt's eating habits.   4. Chronic pain of both knees -She will continue on current  medication, follow up with  Dr. Gavin Potters @ [facility].  She was advised to go to the ED for worsening symptoms.   5. Health care maintenance Routine labs will be checked - HgB A1c; Future - Lipid panel; Future - Comp Met (CMET); Future - CBC w/Diff; Future     Return in about 2 months (around 03/01/2023), or if symptoms worsen or fail to improve.    Lutricia Horsfall, RN

## 2023-01-01 NOTE — Addendum Note (Signed)
Encounter addended by: Crissie Figures, RN on: 01/01/2023 2:20 PM  Actions taken: Imaging Exam ended

## 2023-01-04 NOTE — Progress Notes (Signed)
PCP: Rolm Gala, NP (last seen 09/24) Primary Cardiologist: Lorine Bears, MD (last seen 07/24; returns 11/24)  HPI:  Ms Meditz is a 57 y/o female with a history of asthma, CAD (mild nonobstructive), chronic chest pain, hyperlipidemia, HTN, seizures, obesity, DM, previous tobacco use and chronic heart failure.   Has not been admitted or been in the ED in the last 6 months.   Echo 02/27/17: EF of 55-60%. Echo 01/06/20: EF of 55-60% along with mild LVH and mild MR Echo 06/14/20: EF of 50-55% along with mild LVH. Echo 06/08/22: EF 40-45% along with mild/moderate MR.    RHC/LHC 07/04/22:   There is mild left ventricular systolic dysfunction.   LV end diastolic pressure is normal.   The left ventricular ejection fraction is 50-55% by visual estimate.  1.  Minimal nonobstructive coronary artery disease. 2.  Mildly reduced LV systolic function with an EF of 50%. 3.  Right heart catheterization showed normal filling pressures, normal pulmonary pressure and normal cardiac output.  She presents today for a HF follow up visit with a chief complaint of minimal shortness of breath. Chronic in nature. Has associated fatigue, intermittent chest pain (when exerting herself), left knee pain, right LBP  & chronic difficulty sleeping due to the pain along with this. Denies palpitations, cough, abdominal distention or dizziness.   Now has medicaid and is in the process of re-establishing herself with her former PCP. Is concerned about now having to pay her medication co-pays as she has $0 income and that she may run out of ozempic.                           ROS: All systems negative except as listed in HPI, PMH and Problem List.  SH:  Social History   Socioeconomic History   Marital status: Divorced    Spouse name: Not on file   Number of children: 2   Years of education: pre-requisites    Highest education level: GED or equivalent  Occupational History   Occupation: unemployed  Tobacco Use    Smoking status: Former    Current packs/day: 0.00    Average packs/day: 1 pack/day for 30.0 years (30.0 ttl pk-yrs)    Types: Cigarettes    Start date: 08/17/1987    Quit date: 08/16/2017    Years since quitting: 5.3   Smokeless tobacco: Never  Vaping Use   Vaping status: Never Used  Substance and Sexual Activity   Alcohol use: Not Currently    Comment: last use 01/2022   Drug use: Not Currently    Comment: Previously used cocaine in the 1990s   Sexual activity: Yes    Birth control/protection: Condom  Other Topics Concern   Not on file  Social History Narrative   Currently on food stamps. They help somewhat but still difficult to buy food that she needs. Boyfriend helps pay rent with disability, they live together. Roof is leaking really badly. Lives in modular home. Social services bought tarp which helped, but it deteriorated from heat.    Social Determinants of Health   Financial Resource Strain: High Risk (02/12/2018)   Overall Financial Resource Strain (CARDIA)    Difficulty of Paying Living Expenses: Hard  Food Insecurity: No Food Insecurity (10/20/2021)   Hunger Vital Sign    Worried About Running Out of Food in the Last Year: Never true    Ran Out of Food in the Last Year: Never true  Transportation Needs: No Transportation Needs (10/20/2021)   PRAPARE - Administrator, Civil Service (Medical): No    Lack of Transportation (Non-Medical): No  Physical Activity: Sufficiently Active (11/06/2017)   Exercise Vital Sign    Days of Exercise per Week: 2 days    Minutes of Exercise per Session: 100 min  Stress: Stress Concern Present (11/06/2017)   [employer] of Occupational Health - Occupational Stress Questionnaire    Feeling of Stress : To some extent  Social Connections: Moderately Isolated (11/06/2017)   Social Connection and Isolation Panel [NHANES]    Frequency of Communication with Friends and Family: More than three times a week    Frequency of Social  Gatherings with Friends and Family: Once a week    Attends Religious Services: Never    Database administrator or Organizations: No    Attends Banker Meetings: Never    Marital Status: Divorced  Catering manager Violence: Not At Risk (11/06/2017)   Humiliation, Afraid, Rape, and Kick questionnaire    Fear of Current or Ex-Partner: No    Emotionally Abused: No    Physically Abused: No    Sexually Abused: No    FH:  Family History  Problem Relation Age of Onset   Kidney disease Sister    Diabetes Sister    COPD Sister    Stroke Sister    Thyroid disease Sister    Diabetes Mother    Stroke Mother    Heart attack Mother    Thyroid disease Mother    Colon cancer Mother    Stroke Father    Heart attack Father    Asthma Daughter    Hypertension Daughter    Thyroid disease Daughter    Asthma Son    Heart disease Maternal Grandmother    Hypertension Maternal Grandmother    Colon cancer Maternal Grandfather    Hypertension Maternal Grandfather    Hyperlipidemia Maternal Grandfather    Stroke Paternal Grandmother    Hypertension Paternal Grandfather    Hyperlipidemia Paternal Grandfather    Heart disease Paternal Grandfather    Thyroid disease Sister    Migraines Sister     Past Medical History:  Diagnosis Date   (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 02/2016 Echo: EF 45%; b. 03/2016 Echo: EF 55%; c. 12/2019 Echo: EF 55-60%, no rwma, gr2 DD, nl RV size/fxn, mildly dil RA, mild MR.   Asthma    Chronic chest pain    a. ? vasospasm vs endothelial dysfxn. Improved w/ Ranexa but couldn't afford.   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 Presumed in setting of prior h/o MI w/ insignificant coronary dzs.   Diabetes mellitus without complication (HCC)    Heart murmur    History of NICM (nonischemic cardiomyopathy) (HCC)    a. 02/2016 Echo: EF 45%; b. 12/2019 Echo: EF 55-60%.   Hyperlipidemia    Hypertension    Myocardial infarction [facility])     Non-obstructive Coronary artery disease    a. Prior caths in 2007, 2011, 2012 - nonobs dzs; b. 03/2016 Cath ([facility]): LM nl, LAD/LCX/RCA insignificant dzs.   Obesity    Rate-dependent LBBB (left bundle branch block)    Remote Tobacco abuse    a. 30+ pack year history - quit 2019.   Seizures (HCC)     Current Outpatient Medications  Medication Sig Dispense Refill   blood glucose meter kit and supplies KIT Dispense based on  patient and insurance preference. Use up to four times daily as directed. (FOR ICD-9 250.00, 250.01). 1 each 0   Blood Pressure Monitoring (BLOOD PRESSURE KIT) KIT 1 kit by Does not apply route daily as needed. 1 kit 0   clopidogrel (PLAVIX) 75 MG tablet Take 1 tablet (75 mg total) by mouth once daily. 90 tablet 3   diphenhydrAMINE (BENADRYL) 50 MG tablet Take 50 mg by mouth 2 (two) times daily.     EPINEPHrine 0.3 mg/0.3 mL IJ SOAJ injection Inject 0.3 into the muscle as needed for anaphylaxis 4 each 6   glucose blood (RIGHTEST GS550 BLOOD GLUCOSE) test strip Use as directed to test blood sugar. 100 each 11   Insulin Pen Needle 32G X 6 MM MISC Use one pen needle with Ozempic once every week 100 each 3   isosorbide mononitrate (IMDUR) 60 MG 24 hr tablet Take 1 tablet (60 mg total) by mouth daily. 90 tablet 3   metoprolol tartrate (LOPRESSOR) 25 MG tablet Take 1 tablet (25 mg total) by mouth 2 (two) times daily. 180 tablet 3   nitroGLYCERIN (NITROSTAT) 0.4 MG SL tablet 1 TABLET UNDER TONGUE AS NEEDED FOR CHEST PAIN EVERY 5 MINUTES FOR MAX OF 3 DOSES IN 15 MINUTES; IF NO RELIEF AFTER 1ST DOSE CALL 911 (Patient not taking: Reported on 11/02/2022) 25 tablet 0   pantoprazole (PROTONIX) 40 MG tablet Take 1 tablet (40 mg total) by mouth once daily. 90 tablet 3   potassium chloride SA (KLOR-CON M) 20 MEQ tablet Take 1 tablet (20 mEq total) by mouth 2 (two) times daily. 180 tablet 3   ranolazine (RANEXA) 500 MG 12 hr tablet Take 1 tablet (500 mg total) by mouth 2 (two) times daily.  180 tablet 3   Rightest GL300 Lancets MISC Use as directed to test blood sugar. 100 each 11   sacubitril-valsartan (ENTRESTO) 24-26 MG Take 1 tablet by mouth 2 (two) times daily. 180 tablet 3   Semaglutide, 1 MG/DOSE, (OZEMPIC, 1 MG/DOSE,) 4 MG/3ML SOPN Inject 1 mg into the skin once a week. Instead of Trulicity 3 mL 6   Semaglutide, 1 MG/DOSE, (OZEMPIC, 1 MG/DOSE,) 4 MG/3ML SOPN Inject 1 mg into the skin once a week. 12 mL 3   simvastatin (ZOCOR) 20 MG tablet Take 1 tablet (20 mg total) by mouth once daily. 90 tablet 1   spironolactone (ALDACTONE) 25 MG tablet Take 1 tablet (25 mg total) by mouth daily. 90 tablet 3   torsemide (DEMADEX) 20 MG tablet Take 3 tablets (60 mg total) by mouth daily. 270 tablet 3   No current facility-administered medications for this visit.   Vitals:   01/05/23 1453  BP: 131/78  Pulse: 92  SpO2: 98%  Weight: 280 lb (127 kg)   Wt Readings from Last 3 Encounters:  01/05/23 280 lb (127 kg)  12/26/22 278 lb 12.8 oz (126.5 kg)  12/12/22 279 lb 9.6 oz (126.8 kg)   Lab Results  Component Value Date   CREATININE 0.97 09/27/2022   CREATININE 0.93 08/09/2022   CREATININE 0.87 06/23/2022   PHYSICAL EXAM:  General:  Well appearing. No resp difficulty HEENT: normal Neck: supple. JVP flat. No lymphadenopathy or thryomegaly appreciated. Cor: PMI normal. Regular rate & rhythm. No rubs, gallops or murmurs. Lungs: clear Abdomen: soft, nontender, nondistended. No hepatosplenomegaly. No bruits or masses.  Extremities: no cyanosis, clubbing, rash, trace pitting edema left lowr leg Neuro: alert & oriented x3, cranial nerves grossly intact. Moves all 4 extremities w/o  difficulty. Affect pleasant.   ECG: NSR with LBBB (unchanged from previous); reviewed personally   ASSESSMENT & PLAN:  1: Chronic heart failure with mildly reduced ejection fraction with LVH- - etiology likely LBBB as cath showed nonobstructive CAD - NYHA class II - euvolemic today  - weighing  daily; reminded to call for an overnight weight gain of >2 pounds or a weekly weight gain of >5 pounds - weight down 1 pound from last visit here 1 month ago - Echo 02/27/17: EF of 55-60%. - Echo 01/06/20: EF of 55-60% along with mild LVH and mild MR - Echo 06/14/20: EF of 50-55% along with mild LVH.  - Echo 06/08/22: EF 40-45% along with mild/moderate MR.  - has been following a daily 2L fluid intake along with closely following a 2000mg  sodium diet - continue spironolactone 25mg  daily - continue torsemide 60mg  daily/ potassium BID - continue entresto 24/26mg  BID - jardiance caused HA, nausea and increased thirst so this was stopped - BNP 06/12/20 was 45.8  2: HTN- - BP 131/78 - saw PCP (llobachie NP) 09/24; is hoping to re-establish herself with [facility] where she was years ago - BMP 09/27/22 reviewed and showed sodium 138, potassium 3.7, creatinine 0.97 and GFR >60  3: DM- - A1c 07/26/22 was 5.6% - ozempic 1mg  weekly; advised patient that if I need to prescribe a month of ozempic while she getting established with new PCP to let me know - has endocrinology appt 10/24  4: Nonobstructive CAD- - saw cardiology Fransico Michael) 07/24 - continue plavix 75mg  daily - continue isosorbide MN 60mg  daily  - continue metoprolol tartrate 25mg  BID - continue ranexa 500mg  BID - RHC/LHC 07/04/22:   There is mild left ventricular systolic dysfunction.   LV end diastolic pressure is normal.   The left ventricular ejection fraction is 50-55% by visual estimate.  1.  Minimal nonobstructive coronary artery disease. 2.  Mildly reduced LV systolic function with an EF of 50%. 3.  Right heart catheterization showed normal filling pressures, normal pulmonary pressure and normal cardiac output.  5: Palpitations- - reviewed holter monitor with patient - holter monitor 08/24 showed min HR of 43 bpm, max HR of 158 bpm, and avg HR of 70 bpm. Predominant underlying rhythm was Sinus Rhythm. Bundle Branch  Block/IVCD was present. 1 run of Supraventricular Tachycardia occurred lasting 4 beats with a max rate of 158 bpm (avg 140 bpm). Isolated SVEs were rare (<1.0%), SVE Couplets were rare (<1.0%), and SVE Triplets were rare (<1.0%). No Isolated VEs, VE Couplets, or VE Triplets were present.  Return in 4 months, sooner if needed.

## 2023-01-05 ENCOUNTER — Encounter: Payer: Self-pay | Admitting: Family

## 2023-01-05 ENCOUNTER — Ambulatory Visit: Payer: Medicaid Other | Attending: Family | Admitting: Family

## 2023-01-05 VITALS — BP 131/78 | HR 92 | Wt 280.0 lb

## 2023-01-05 DIAGNOSIS — J45909 Unspecified asthma, uncomplicated: Secondary | ICD-10-CM | POA: Diagnosis present

## 2023-01-05 DIAGNOSIS — E669 Obesity, unspecified: Secondary | ICD-10-CM | POA: Diagnosis not present

## 2023-01-05 DIAGNOSIS — E119 Type 2 diabetes mellitus without complications: Secondary | ICD-10-CM

## 2023-01-05 DIAGNOSIS — I110 Hypertensive heart disease with heart failure: Secondary | ICD-10-CM | POA: Insufficient documentation

## 2023-01-05 DIAGNOSIS — I5022 Chronic systolic (congestive) heart failure: Secondary | ICD-10-CM

## 2023-01-05 DIAGNOSIS — I2510 Atherosclerotic heart disease of native coronary artery without angina pectoris: Secondary | ICD-10-CM | POA: Diagnosis not present

## 2023-01-05 DIAGNOSIS — E785 Hyperlipidemia, unspecified: Secondary | ICD-10-CM | POA: Insufficient documentation

## 2023-01-05 DIAGNOSIS — I10 Essential (primary) hypertension: Secondary | ICD-10-CM | POA: Diagnosis not present

## 2023-01-05 DIAGNOSIS — Z87891 Personal history of nicotine dependence: Secondary | ICD-10-CM | POA: Insufficient documentation

## 2023-01-05 DIAGNOSIS — R002 Palpitations: Secondary | ICD-10-CM

## 2023-01-10 ENCOUNTER — Other Ambulatory Visit: Payer: Self-pay

## 2023-01-17 ENCOUNTER — Other Ambulatory Visit: Payer: Self-pay

## 2023-01-18 ENCOUNTER — Ambulatory Visit: Payer: Medicaid Other

## 2023-01-18 ENCOUNTER — Other Ambulatory Visit: Payer: Self-pay

## 2023-01-22 ENCOUNTER — Other Ambulatory Visit: Payer: Self-pay

## 2023-01-25 ENCOUNTER — Other Ambulatory Visit: Payer: Self-pay

## 2023-01-26 ENCOUNTER — Other Ambulatory Visit: Payer: Self-pay

## 2023-01-31 ENCOUNTER — Other Ambulatory Visit: Payer: Self-pay

## 2023-02-02 ENCOUNTER — Other Ambulatory Visit: Payer: Self-pay

## 2023-02-12 ENCOUNTER — Other Ambulatory Visit: Payer: Self-pay

## 2023-02-12 ENCOUNTER — Ambulatory Visit: Payer: Medicaid Other | Admitting: Internal Medicine

## 2023-02-12 ENCOUNTER — Encounter: Payer: Self-pay | Admitting: Internal Medicine

## 2023-02-12 VITALS — BP 132/72 | HR 78 | Ht 66.0 in | Wt 280.6 lb

## 2023-02-12 DIAGNOSIS — E119 Type 2 diabetes mellitus without complications: Secondary | ICD-10-CM

## 2023-02-12 DIAGNOSIS — M75112 Incomplete rotator cuff tear or rupture of left shoulder, not specified as traumatic: Secondary | ICD-10-CM

## 2023-02-12 DIAGNOSIS — E66813 Obesity, class 3: Secondary | ICD-10-CM

## 2023-02-12 DIAGNOSIS — E782 Mixed hyperlipidemia: Secondary | ICD-10-CM

## 2023-02-12 DIAGNOSIS — M1711 Unilateral primary osteoarthritis, right knee: Secondary | ICD-10-CM

## 2023-02-12 DIAGNOSIS — I10 Essential (primary) hypertension: Secondary | ICD-10-CM

## 2023-02-12 DIAGNOSIS — M1712 Unilateral primary osteoarthritis, left knee: Secondary | ICD-10-CM | POA: Diagnosis not present

## 2023-02-12 DIAGNOSIS — Z23 Encounter for immunization: Secondary | ICD-10-CM

## 2023-02-12 DIAGNOSIS — Z6841 Body Mass Index (BMI) 40.0 and over, adult: Secondary | ICD-10-CM

## 2023-02-12 MED ORDER — SIMVASTATIN 20 MG PO TABS
20.0000 mg | ORAL_TABLET | Freq: Every day | ORAL | 1 refills | Status: DC
  Filled 2023-02-12: qty 90, 90d supply, fill #0
  Filled 2023-05-13: qty 90, 90d supply, fill #1

## 2023-02-12 MED ORDER — OZEMPIC (1 MG/DOSE) 4 MG/3ML ~~LOC~~ SOPN
1.0000 mg | PEN_INJECTOR | SUBCUTANEOUS | 3 refills | Status: DC
  Filled 2023-02-12: qty 12, 112d supply, fill #0

## 2023-02-12 NOTE — Progress Notes (Unsigned)
New Patient Office Visit  Subjective:  Patient ID: Kathy [name], female    DOB: [birth year]  Age: 57 y.o. MRN: [id number]  No chief complaint on file.   [facility] PMH of diabetes, obesity, OA and c/o generalized joint pains in both shoulders and knees.    No other concerns at this time.   Past Medical History:  Diagnosis Date  . 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 02/2016 Echo: EF 45%; b. 03/2016 Echo: EF 55%; c. 12/2019 Echo: EF 55-60%, no rwma, gr2 DD, nl RV size/fxn, mildly dil RA, mild MR.  . Asthma   . Chronic chest pain    a. ? vasospasm vs endothelial dysfxn. Improved w/ Ranexa but couldn't afford.  . 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 Presumed in setting of prior h/o MI w/ insignificant coronary dzs.  . Diabetes mellitus without complication (HCC)   . Heart murmur   . History of NICM (nonischemic cardiomyopathy) (HCC)    a. 02/2016 Echo: EF 45%; b. 12/2019 Echo: EF 55-60%.  . Hyperlipidemia   . Hypertension   . Myocardial infarction (HCC)   . Non-obstructive Coronary artery disease    a. Prior caths in 2007, 2011, 2012 - nonobs dzs; b. 03/2016 Cath ([facility]): LM nl, LAD/LCX/RCA insignificant dzs.  . Obesity   . Rate-dependent LBBB (left bundle branch block)   . Remote Tobacco abuse    a. 30+ pack year history - quit 2019.  . Seizures (HCC)     Past Surgical History:  Procedure Laterality Date  . ANTERIOR CRUCIATE LIGAMENT REPAIR    . BACK SURGERY     L4 and L5  . CARDIAC CATHETERIZATION  04/09/2016  . COLONOSCOPY    . COLONOSCOPY WITH PROPOFOL N/A 08/04/2019   Procedure: COLONOSCOPY WITH PROPOFOL;  Surgeon: Toney Reil, MD;  Location[facility] ENDOSCOPY;  Service: Gastroenterology;  Laterality: N/A;  . EMPYEMA DRAINAGE     at age 15  . RIGHT/LEFT HEART CATH AND CORONARY ANGIOGRAPHY Bilateral 07/04/2022   Procedure: RIGHT/LEFT HEART CATH AND CORONARY ANGIOGRAPHY;  Surgeon: Iran Ouch, MD;  Location: [facility] INVASIVE CV LAB;  Service: Cardiovascular;  Laterality: Bilateral;  . TONSILLECTOMY    . TONSILLECTOMY AND ADENOIDECTOMY  2008  . TUBAL LIGATION      Social History   Socioeconomic History  . Marital status: Divorced    Spouse name: Not on file  . Number of children: 2  . Years of education: pre-requisites   . Highest education level: GED or equivalent  Occupational History  . Occupation: unemployed  Tobacco Use  . Smoking status: Former    Current packs/day: 0.00    Average packs/day: 1 pack/day for 30.0 years (30.0 ttl pk-yrs)    Types: Cigarettes    Start date: 08/17/1987    Quit date: 08/16/2017    Years since quitting: 5.4  . Smokeless tobacco: Never  Vaping Use  . Vaping status: Never Used  Substance and Sexual Activity  . Alcohol use: Not Currently    Comment: last use 01/2022  . Drug use: Not Currently    Comment: Previously used cocaine in the 1990s  . Sexual activity: Yes    Birth control/protection: Condom  Other Topics Concern  . Not on file  Social History Narrative   Currently on food stamps. They help somewhat but still difficult to buy food that she needs. Boyfriend helps pay rent  with disability, they live together. Roof is leaking really badly. Lives in modular home. Social services bought tarp which helped, but it deteriorated from heat.    Social Determinants of Health   Financial Resource Strain: High Risk (02/12/2018)   Overall Financial Resource Strain (CARDIA)   . Difficulty of Paying Living Expenses: Hard  Food Insecurity: No Food Insecurity (10/20/2021)   Hunger Vital Sign   . Worried About Programme researcher, broadcasting/film/video in the Last Year: Never true   . Ran Out of Food in the Last Year: Never true  Transportation Needs: No Transportation Needs (10/20/2021)   PRAPARE - Transportation   . Lack of Transportation (Medical): No   . Lack of Transportation (Non-Medical): No  Physical Activity: Sufficiently Active (11/06/2017)    Exercise Vital Sign   . Days of Exercise per Week: 2 days   . Minutes of Exercise per Session: 100 min  Stress: Stress Concern Present (11/06/2017)   [employer] of Occupational Health - Occupational Stress Questionnaire   . Feeling of Stress : To some extent  Social Connections: Moderately Isolated (11/06/2017)   Social Connection and Isolation Panel [NHANES]   . Frequency of Communication with Friends and Family: More than three times a week   . Frequency of Social Gatherings with Friends and Family: Once a week   . Attends Religious Services: Never   . Active Member of Clubs or Organizations: No   . Attends Banker Meetings: Never   . Marital Status: Divorced  Catering manager Violence: Not At Risk (11/06/2017)   Humiliation, Afraid, Rape, and Kick questionnaire   . Fear of Current or Ex-Partner: No   . Emotionally Abused: No   . Physically Abused: No   . Sexually Abused: No    Family History  Problem Relation Age of Onset  . Kidney disease Sister   . Diabetes Sister   . COPD Sister   . Stroke Sister   . Thyroid disease Sister   . Diabetes Mother   . Stroke Mother   . Heart attack Mother   . Thyroid disease Mother   . Colon cancer Mother   . Stroke Father   . Heart attack Father   . Asthma Daughter   . Hypertension Daughter   . Thyroid disease Daughter   . Asthma Son   . Heart disease Maternal Grandmother   . Hypertension Maternal Grandmother   . Colon cancer Maternal Grandfather   . Hypertension Maternal Grandfather   . Hyperlipidemia Maternal Grandfather   . Stroke Paternal Grandmother   . Hypertension Paternal Grandfather   . Hyperlipidemia Paternal Grandfather   . Heart disease Paternal Grandfather   . Thyroid disease Sister   . Migraines Sister     Allergies  Allergen Reactions  . Aspirin Anaphylaxis, Hives and Swelling    Occurred as a child Occurred as a child  . Beef Allergy Hives  . Celebrex [Celecoxib] Anaphylaxis and Hives  .  Fentanyl Anaphylaxis  . Fluticasone Propionate Other (See Comments) and Swelling    Swelling, pain, and itching  . Gabapentin Shortness Of Breath  . Hydrocodone-Acetaminophen Hives, Other (See Comments) and Swelling    Feels like her tongue swells. Trouble swallowing  . Latex Hives  . Lisinopril Shortness Of Breath  . Morphine Anaphylaxis and Swelling  . Oxycodone-Acetaminophen Hives and Shortness Of Breath  . Percocet [Oxycodone-Acetaminophen] Hives and Shortness Of Breath  . Pork Allergy Hives  . Simvastatin Hives and Shortness Of Breath  Can tolerate 20 mg dose.  Olam Idler [Metaxalone] Anaphylaxis  . Beef-Derived Products Hives  . Pork-Derived Conservation officer, nature  . Vicodin [Hydrocodone-Acetaminophen] Hives and Other (See Comments)    Feels like her tongue swells. Trouble swallowing  . Albuterol Palpitations  . Jardiance [Empagliflozin] Nausea Only and Palpitations    Review of Systems  Constitutional: Negative.  Negative for weight loss.  HENT: Negative.    Eyes: Negative.   Respiratory: Negative.    Cardiovascular: Negative.   Gastrointestinal: Negative.   Genitourinary: Negative.   Musculoskeletal:  Positive for joint pain.  Skin: Negative.   Neurological: Negative.   Endo/Heme/Allergies: Negative.        Objective:   BP 132/72   Pulse 78   Ht 5\' 6"  (1.676 m)   Wt 280 lb 9.6 oz (127.3 kg)   LMP 06/09/2018   SpO2 98%   BMI 45.29 kg/m   Vitals:   02/12/23 1441  BP: 132/72  Pulse: 78  Height: 5\' 6"  (1.676 m)  Weight: 280 lb 9.6 oz (127.3 kg)  SpO2: 98%  BMI (Calculated): 45.31    Physical Exam Vitals reviewed.  Constitutional:      General: She is not in acute distress.    Appearance: She is obese.  HENT:     Head: Normocephalic.     Nose: Nose normal.     Mouth/Throat:     Mouth: Mucous membranes are moist.  Eyes:     Extraocular Movements: Extraocular movements intact.     Pupils: Pupils are equal, round, and reactive to light.   Cardiovascular:     Rate and Rhythm: Normal rate and regular rhythm.     Heart sounds: No murmur heard. Pulmonary:     Effort: Pulmonary effort is normal.     Breath sounds: No rhonchi or rales.  Abdominal:     General: Abdomen is flat.     Palpations: There is no hepatomegaly, splenomegaly or mass.  Musculoskeletal:     Right shoulder: Decreased range of motion.     Left shoulder: Decreased range of motion.     Cervical back: Normal range of motion. No tenderness.  Skin:    General: Skin is warm and dry.  Neurological:     General: No focal deficit present.     Mental Status: She is alert and oriented to person, place, and time.     Cranial Nerves: No cranial nerve deficit.     Motor: No weakness.  Psychiatric:        Mood and Affect: Mood normal.        Behavior: Behavior normal.     No results found for any visits on 02/12/23.  No results found for this or any previous visit (from the past 2160 hour(Alyzah Pelly)).    Assessment & Plan:  As per problem list  Problem List Items Addressed This Visit       Cardiovascular and Mediastinum   Hypertension   Relevant Orders   Comprehensive metabolic panel     Endocrine   Type 2 diabetes mellitus without complications (HCC) - Primary   Relevant Orders   POCT CBG (Fasting - Glucose)   POCT CBG (Fasting - Glucose)   Hemoglobin A1c   Lipid panel     Musculoskeletal and Integument   Nontraumatic incomplete tear of left rotator cuff   Relevant Orders   Ambulatory referral to Orthopedics   Arthritis of knee, right   Arthritis of left knee     Other  Obesity   Relevant Orders   Ambulatory referral to Sleep Studies    Return in about 2 weeks (around 02/26/2023).   Total time spent: 40 minutes  Luna Fuse, MD  02/12/2023   This document may have been prepared by [employer] Voice Recognition software and as such may include unintentional dictation errors.

## 2023-02-15 ENCOUNTER — Encounter: Payer: Self-pay | Admitting: Cardiovascular Disease

## 2023-02-15 ENCOUNTER — Ambulatory Visit: Payer: Medicaid Other | Attending: Cardiovascular Disease | Admitting: Cardiovascular Disease

## 2023-02-15 VITALS — BP 110/82 | HR 73 | Ht 67.0 in | Wt 280.6 lb

## 2023-02-15 DIAGNOSIS — E785 Hyperlipidemia, unspecified: Secondary | ICD-10-CM

## 2023-02-15 DIAGNOSIS — I25118 Atherosclerotic heart disease of native coronary artery with other forms of angina pectoris: Secondary | ICD-10-CM

## 2023-02-15 DIAGNOSIS — I5022 Chronic systolic (congestive) heart failure: Secondary | ICD-10-CM | POA: Diagnosis not present

## 2023-02-15 DIAGNOSIS — I10 Essential (primary) hypertension: Secondary | ICD-10-CM | POA: Diagnosis not present

## 2023-02-15 NOTE — Patient Instructions (Signed)

## 2023-02-15 NOTE — Progress Notes (Signed)
Cardiology Office Note   Date:  02/15/2023   ID:  Kathy [name], DOB 25-Feb-1966, MRN [id number]  PCP:  Sherron Monday, MD  Cardiologist:   Lorine Bears, MD   No chief complaint on file.     History of Present Illness: Kathy [name] is a 57 y.o. female who is here today for follow-up visit regarding chronic chest pain, left bundle branch block and chronic systolic heart failure.    The patient has known history of recurrent chest pain usually responsive to nitroglycerin which is thought to be due to coronary spasm or endothelial dysfunction.    She had cardiac catheterization done 4 times in the past in 2007, 2011, 2012 , 2017. All these showed mild nonobstructive coronary artery disease.  She is known to have  left bundle branch block.  She is allergic to aspirin and thus on long-term Plavix.  Other medical problems include previous tobacco use, obesity, chronic back pain and hyperlipidemia.  She has family history of coronary artery disease.  She had an echocardiogram done in February of this year which showed an EF of 40 to 45%.  This was followed by left heart catheterization which showed minimal nonobstructive coronary artery disease.  EF was 50%.  Right heart catheterization showed normal filling pressures, normal pulmonary pressure and normal cardiac output.  She has been doing well with no shortness of breath or lower extremity edema.  She complains of sharp chest discomfort lasting for few seconds.  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 02/2016 Echo: EF 45%; b. 03/2016 Echo: EF 55%; c. 12/2019 Echo: EF 55-60%, no rwma, gr2 DD, nl RV size/fxn, mildly dil RA, mild MR.   Asthma    Chronic chest pain    a. ? vasospasm vs endothelial dysfxn. Improved w/ Ranexa but couldn't afford.   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 Presumed in setting of prior h/o MI w/ insignificant coronary dzs.   Diabetes mellitus  without complication (HCC)    Heart murmur    History of NICM (nonischemic cardiomyopathy) (HCC)    a. 02/2016 Echo: EF 45%; b. 12/2019 Echo: EF 55-60%.   Hyperlipidemia    Hypertension    Myocardial infarction [facility])    Non-obstructive Coronary artery disease    a. Prior caths in 2007, 2011, 2012 - nonobs dzs; b. 03/2016 Cath ([facility]): LM nl, LAD/LCX/RCA insignificant dzs.   Obesity    Rate-dependent LBBB (left bundle branch block)    Remote Tobacco abuse    a. 30+ pack year history - quit 2019.   Seizures (HCC)     Past Surgical History:  Procedure Laterality Date   ANTERIOR CRUCIATE LIGAMENT REPAIR     BACK SURGERY     L4 and L5   CARDIAC CATHETERIZATION  04/09/2016   COLONOSCOPY     COLONOSCOPY WITH PROPOFOL N/A 08/04/2019   Procedure: COLONOSCOPY WITH PROPOFOL;  Surgeon: Toney Reil, MD;  Location: [facility] ENDOSCOPY;  Service: Gastroenterology;  Laterality: N/A;   EMPYEMA DRAINAGE     at age 53   RIGHT/LEFT HEART CATH AND CORONARY ANGIOGRAPHY Bilateral 07/04/2022   Procedure: RIGHT/LEFT HEART CATH AND CORONARY ANGIOGRAPHY;  Surgeon: Iran Ouch, MD;  Location: [facility] INVASIVE CV LAB;  Service: Cardiovascular;  Laterality: Bilateral;   TONSILLECTOMY     TONSILLECTOMY AND ADENOIDECTOMY  2008   TUBAL LIGATION       Current Outpatient  Medications  Medication Sig Dispense Refill   blood glucose meter kit and supplies KIT Dispense based on patient and insurance preference. Use up to four times daily as directed. (FOR ICD-9 250.00, 250.01). 1 each 0   Blood Pressure Monitoring (BLOOD PRESSURE KIT) KIT 1 kit by Does not apply route daily as needed. 1 kit 0   clopidogrel (PLAVIX) 75 MG tablet Take 1 tablet (75 mg total) by mouth once daily. 90 tablet 3   diphenhydrAMINE (BENADRYL) 50 MG tablet Take 50 mg by mouth 2 (two) times daily.     EPINEPHrine 0.3 mg/0.3 mL IJ SOAJ injection Inject 0.3 into the muscle as needed for anaphylaxis 4 each 6   glucose blood (RIGHTEST GS550  BLOOD GLUCOSE) test strip Use as directed to test blood sugar. 100 each 11   Insulin Pen Needle 32G X 6 MM MISC Use one pen needle with Ozempic once every week 100 each 3   isosorbide mononitrate (IMDUR) 60 MG 24 hr tablet Take 1 tablet (60 mg total) by mouth daily. 90 tablet 3   metoprolol tartrate (LOPRESSOR) 25 MG tablet Take 1 tablet (25 mg total) by mouth 2 (two) times daily. 180 tablet 3   nitroGLYCERIN (NITROSTAT) 0.4 MG SL tablet 1 TABLET UNDER TONGUE AS NEEDED FOR CHEST PAIN EVERY 5 MINUTES FOR MAX OF 3 DOSES IN 15 MINUTES; IF NO RELIEF AFTER 1ST DOSE CALL 911 25 tablet 0   pantoprazole (PROTONIX) 40 MG tablet Take 1 tablet (40 mg total) by mouth once daily. 90 tablet 3   potassium chloride SA (KLOR-CON M) 20 MEQ tablet Take 1 tablet (20 mEq total) by mouth 2 (two) times daily. 180 tablet 3   ranolazine (RANEXA) 500 MG 12 hr tablet Take 1 tablet (500 mg total) by mouth 2 (two) times daily. 180 tablet 3   Rightest GL300 Lancets MISC Use as directed to test blood sugar. 100 each 11   sacubitril-valsartan (ENTRESTO) 24-26 MG Take 1 tablet by mouth 2 (two) times daily. 180 tablet 3   Semaglutide, 1 MG/DOSE, (OZEMPIC, 1 MG/DOSE,) 4 MG/3ML SOPN Inject 1 mg into the skin once a week. Instead of Trulicity 3 mL 6   simvastatin (ZOCOR) 20 MG tablet Take 1 tablet (20 mg total) by mouth once daily. 90 tablet 1   spironolactone (ALDACTONE) 25 MG tablet Take 1 tablet (25 mg total) by mouth daily. 90 tablet 3   torsemide (DEMADEX) 20 MG tablet Take 3 tablets (60 mg total) by mouth daily. 270 tablet 3   No current facility-administered medications for this visit.    Allergies:   Aspirin, Beef allergy, Celebrex [celecoxib], Fentanyl, Fluticasone propionate, Gabapentin, Hydrocodone-acetaminophen, Latex, Lisinopril, Morphine, Oxycodone-acetaminophen, Percocet [oxycodone-acetaminophen], Pork allergy, Simvastatin, Skelaxin [metaxalone], Beef-derived products, Pork-derived products, Vicodin  [hydrocodone-acetaminophen], Albuterol, and Jardiance [empagliflozin]    Social History:  The patient  reports that she quit smoking about 5 years ago. Her smoking use included cigarettes. She started smoking about 35 years ago. She has a 30 pack-year smoking history. She has never used smokeless tobacco. She reports that she does not currently use alcohol. She reports that she does not currently use drugs.   Family History:  The patient's family history includes Asthma in her daughter and son; COPD in her sister; Colon cancer in her maternal grandfather and mother; Diabetes in her mother and sister; Heart attack in her father and mother; Heart disease in her maternal grandmother and paternal grandfather; Hyperlipidemia in her maternal grandfather and paternal grandfather; Hypertension in  her daughter, maternal grandfather, maternal grandmother, and paternal grandfather; Kidney disease in her sister; Migraines in her sister; Stroke in her father, mother, paternal grandmother, and sister; Thyroid disease in her daughter, mother, sister, and sister.    ROS:  Please see the history of present illness.   Otherwise, review of systems are positive for none.   All other systems are reviewed and negative.    PHYSICAL EXAM: VS:  BP 110/82 (BP Location: Left Arm, Patient Position: Sitting)   Pulse 73   Ht 5\' 7"  (1.702 m)   Wt 280 lb 9.6 oz (127.3 kg)   LMP 06/09/2018   SpO2 99%   BMI 43.95 kg/m  , BMI Body mass index is 43.95 kg/m. GEN: Well nourished, well developed, in no acute distress  HEENT: normal  Neck: no JVD, carotid bruits, or masses Cardiac: RRR; no  rubs, or gallops .  1/ 6 systolic murmur in the aortic area.  trace bilateral leg edema Respiratory:  clear to auscultation bilaterally, normal work of breathing GI: soft, nontender, nondistended, + BS MS: no deformity or atrophy  Skin: warm and dry, no rash Neuro:  Strength and sensation are intact Psych: euthymic mood, full affect Right  radial pulses normal.  EKG:  EKG is ordered today. The ekg ordered today demonstrates normal sinus rhythm with left bundle branch block.   Recent Labs: 06/23/2022: Platelets 282 07/04/2022: Hemoglobin 13.6 07/24/2022: B Natriuretic Peptide 21.9 09/27/2022: BUN 14; Creatinine, Ser 0.97; Potassium 3.7; Sodium 138    Lipid Panel    Component Value Date/Time   CHOL 159 01/18/2022 1216   TRIG 198 (H) 01/18/2022 1216   HDL 32 (L) 01/18/2022 1216   CHOLHDL 5.0 (H) 01/18/2022 1216   CHOLHDL 5.7 02/24/2016 0618   VLDL 30 02/24/2016 0618   LDLCALC 93 01/18/2022 1216      Wt Readings from Last 3 Encounters:  02/15/23 280 lb 9.6 oz (127.3 kg)  02/12/23 280 lb 9.6 oz (127.3 kg)  01/05/23 280 lb (127 kg)          02/23/2017    3:01 PM  PAD Screen  Previous PAD dx? No  Previous surgical procedure? No  Pain with walking? Yes  Subsides with rest? No  Feet/toe relief with dangling? No  Painful, non-healing ulcers? No  Extremities discolored? No      ASSESSMENT AND PLAN:  1.  Mild coronary artery disease with other forms of angina:  She has chronic angina thought to be due to coronary spasm or endothelial dysfunction.  Most recent cardiac catheterization in March of this year showed minimal coronary artery disease.  She is on chronic antianginal therapy with no significant change in symptoms.  2.  Chronic systolic heart failure: Recent echocardiogram showed a drop in her ejection fraction to 40 to 45% .  She appears to be euvolemic on current dose of torsemide.  She is currently on optimal medical therapy.  3.  Hyperlipidemia: Continue treatment with simvastatin.  Most recent LDL was 93.  4.  Previous tobacco use: No relapse.  5.  Essential hypertension: Blood pressure is controlled.   Disposition:   FU with me in 6 months  Signed,  Lorine Bears, MD  02/15/2023 4:33 PM    [facility]

## 2023-02-21 ENCOUNTER — Other Ambulatory Visit: Payer: Self-pay

## 2023-02-23 ENCOUNTER — Other Ambulatory Visit: Payer: Self-pay

## 2023-02-23 ENCOUNTER — Other Ambulatory Visit: Payer: Medicaid Other

## 2023-02-24 LAB — COMPREHENSIVE METABOLIC PANEL
ALT: 17 [IU]/L (ref 0–32)
AST: 16 [IU]/L (ref 0–40)
Albumin: 4.2 g/dL (ref 3.8–4.9)
Alkaline Phosphatase: 100 [IU]/L (ref 44–121)
BUN/Creatinine Ratio: 12 (ref 9–23)
BUN: 12 mg/dL (ref 6–24)
Bilirubin Total: 0.4 mg/dL (ref 0.0–1.2)
CO2: 24 mmol/L (ref 20–29)
Calcium: 9 mg/dL (ref 8.7–10.2)
Chloride: 100 mmol/L (ref 96–106)
Creatinine, Ser: 0.97 mg/dL (ref 0.57–1.00)
Globulin, Total: 2.5 g/dL (ref 1.5–4.5)
Glucose: 98 mg/dL (ref 70–99)
Potassium: 4.5 mmol/L (ref 3.5–5.2)
Sodium: 139 mmol/L (ref 134–144)
Total Protein: 6.7 g/dL (ref 6.0–8.5)
eGFR: 69 mL/min/{1.73_m2} (ref >59)

## 2023-02-24 LAB — HEMOGLOBIN A1C
Est. average glucose Bld gHb Est-mCnc: 117 mg/dL
Hgb A1c MFr Bld: 5.7 % — ABNORMAL HIGH (ref 4.8–5.6)

## 2023-02-24 LAB — LIPID PANEL
Chol/HDL Ratio: 4.8 ratio — ABNORMAL HIGH (ref 0.0–4.4)
Cholesterol, Total: 160 mg/dL (ref 100–199)
HDL: 33 mg/dL — ABNORMAL LOW (ref >39)
LDL Chol Calc (NIH): 99 mg/dL (ref 0–99)
Triglycerides: 161 mg/dL — ABNORMAL HIGH (ref 0–149)
VLDL Cholesterol Cal: 28 mg/dL (ref 5–40)

## 2023-02-26 ENCOUNTER — Other Ambulatory Visit: Payer: Self-pay

## 2023-02-26 ENCOUNTER — Encounter: Payer: Self-pay | Admitting: Internal Medicine

## 2023-02-26 ENCOUNTER — Ambulatory Visit: Payer: Medicaid Other | Admitting: Internal Medicine

## 2023-02-26 VITALS — BP 110/75 | HR 74 | Ht 67.0 in | Wt 280.7 lb

## 2023-02-26 DIAGNOSIS — E119 Type 2 diabetes mellitus without complications: Secondary | ICD-10-CM | POA: Diagnosis not present

## 2023-02-26 DIAGNOSIS — H9193 Unspecified hearing loss, bilateral: Secondary | ICD-10-CM | POA: Diagnosis not present

## 2023-02-26 DIAGNOSIS — Z0130 Encounter for examination of blood pressure without abnormal findings: Secondary | ICD-10-CM

## 2023-02-26 DIAGNOSIS — E66813 Obesity, class 3: Secondary | ICD-10-CM | POA: Diagnosis not present

## 2023-02-26 DIAGNOSIS — Z6841 Body Mass Index (BMI) 40.0 and over, adult: Secondary | ICD-10-CM

## 2023-02-26 LAB — POCT CBG (FASTING - GLUCOSE)-MANUAL ENTRY: Glucose Fasting, POC: 125 mg/dL — AB (ref 70–99)

## 2023-02-26 MED ORDER — ACCU-CHEK SOFTCLIX LANCETS MISC
12 refills | Status: DC
  Filled 2023-02-26: qty 100, 25d supply, fill #0
  Filled 2023-04-13: qty 100, 25d supply, fill #1
  Filled 2023-07-25: qty 100, 25d supply, fill #2
  Filled 2023-08-24: qty 100, 25d supply, fill #3

## 2023-02-26 MED ORDER — ACCU-CHEK SOFTCLIX LANCETS MISC
12 refills | Status: DC
  Filled 2023-02-26: qty 100, fill #0
  Filled 2023-09-19: qty 100, 25d supply, fill #0
  Filled 2023-10-14: qty 100, 25d supply, fill #1
  Filled 2023-11-14: qty 100, 25d supply, fill #2
  Filled 2023-12-06 – 2023-12-19 (×2): qty 100, 25d supply, fill #3
  Filled 2024-01-08: qty 100, 25d supply, fill #4
  Filled 2024-02-04: qty 100, 25d supply, fill #5

## 2023-02-26 MED ORDER — ACCU-CHEK GUIDE W/DEVICE KIT
1.0000 | PACK | Freq: Every day | 0 refills | Status: AC
  Filled 2023-02-26: qty 1, 30d supply, fill #0

## 2023-02-26 MED ORDER — ACCU-CHEK GUIDE TEST VI STRP
ORAL_STRIP | 12 refills | Status: DC
  Filled 2023-02-26: qty 100, 25d supply, fill #0
  Filled 2023-04-13: qty 100, 25d supply, fill #1
  Filled 2023-07-25: qty 100, 25d supply, fill #2
  Filled 2023-08-24: qty 100, 25d supply, fill #3
  Filled 2023-09-19: qty 100, 25d supply, fill #4
  Filled 2023-10-14: qty 100, 25d supply, fill #5
  Filled 2023-11-14: qty 100, 25d supply, fill #6
  Filled 2023-12-06 – 2023-12-19 (×2): qty 100, 25d supply, fill #7
  Filled 2024-01-08: qty 100, 25d supply, fill #8
  Filled 2024-02-04: qty 100, 25d supply, fill #9

## 2023-02-26 MED ORDER — METFORMIN HCL ER 750 MG PO TB24
750.0000 mg | ORAL_TABLET | Freq: Every day | ORAL | 0 refills | Status: DC
  Filled 2023-02-26: qty 90, 90d supply, fill #0

## 2023-02-26 NOTE — Progress Notes (Signed)
Established Patient Office Visit  Subjective:  Patient ID: Kathy [name], female    DOB: [birth year]  Age: 57 y.o. MRN: [id number]  No chief complaint on file.   No new complaints, here for lab review and medication refills. Labs reviewed and notable for well controlled diabetes, A1c at target, lipids also at target with unremarkable cmp but triglycerides elevated. Denies any hypoglycemic episodes and home bg readings have been at target.   No other concerns at this time.   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 02/2016 Echo: EF 45%; b. 03/2016 Echo: EF 55%; c. 12/2019 Echo: EF 55-60%, no rwma, gr2 DD, nl RV size/fxn, mildly dil RA, mild MR.   Asthma    Chronic chest pain    a. ? vasospasm vs endothelial dysfxn. Improved w/ Ranexa but couldn't afford.   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 Presumed in setting of prior h/o MI w/ insignificant coronary dzs.   Diabetes mellitus without complication (HCC)    Heart murmur    History of NICM (nonischemic cardiomyopathy) (HCC)    a. 02/2016 Echo: EF 45%; b. 12/2019 Echo: EF 55-60%.   Hyperlipidemia    Hypertension    Myocardial infarction [facility])    Non-obstructive Coronary artery disease    a. Prior caths in 2007, 2011, 2012 - nonobs dzs; b. 03/2016 Cath ([facility]): LM nl, LAD/LCX/RCA insignificant dzs.   Obesity    Rate-dependent LBBB (left bundle branch block)    Remote Tobacco abuse    a. 30+ pack year history - quit 2019.   Seizures (HCC)     Past Surgical History:  Procedure Laterality Date   ANTERIOR CRUCIATE LIGAMENT REPAIR     BACK SURGERY     L4 and L5   CARDIAC CATHETERIZATION  04/09/2016   COLONOSCOPY     COLONOSCOPY WITH PROPOFOL N/A 08/04/2019   Procedure: COLONOSCOPY WITH PROPOFOL;  Surgeon: Toney Reil, MD;  Location: [facility] ENDOSCOPY;  Service: Gastroenterology;  Laterality: N/A;   EMPYEMA DRAINAGE     at age 67   RIGHT/LEFT HEART CATH AND  CORONARY ANGIOGRAPHY Bilateral 07/04/2022   Procedure: RIGHT/LEFT HEART CATH AND CORONARY ANGIOGRAPHY;  Surgeon: Iran Ouch, MD;  Location: [facility] INVASIVE CV LAB;  Service: Cardiovascular;  Laterality: Bilateral;   TONSILLECTOMY     TONSILLECTOMY AND ADENOIDECTOMY  2008   TUBAL LIGATION      Social History   Socioeconomic History   Marital status: Divorced    Spouse name: Not on file   Number of children: 2   Years of education: pre-requisites    Highest education level: GED or equivalent  Occupational History   Occupation: unemployed  Tobacco Use   Smoking status: Former    Current packs/day: 0.00    Average packs/day: 1 pack/day for 30.0 years (30.0 ttl pk-yrs)    Types: Cigarettes    Start date: 08/17/1987    Quit date: 08/16/2017    Years since quitting: 5.5   Smokeless tobacco: Never  Vaping Use   Vaping status: Never Used  Substance and Sexual Activity   Alcohol use: Not Currently    Comment: last use 01/2022   Drug use: Not Currently    Comment: Previously used cocaine in the 1990s   Sexual activity: Yes    Birth control/protection: Condom  Other Topics Concern   Not on file  Social History Narrative   Currently on food  stamps. They help somewhat but still difficult to buy food that she needs. Boyfriend helps pay rent with disability, they live together. Roof is leaking really badly. Lives in modular home. Social services bought tarp which helped, but it deteriorated from heat.    Social Determinants of Health   Financial Resource Strain: High Risk (02/12/2018)   Overall Financial Resource Strain (CARDIA)    Difficulty of Paying Living Expenses: Hard  Food Insecurity: No Food Insecurity (10/20/2021)   Hunger Vital Sign    Worried About Running Out of Food in the Last Year: Never true    Ran Out of Food in the Last Year: Never true  Transportation Needs: No Transportation Needs (10/20/2021)   PRAPARE - Administrator, Civil Service (Medical): No     Lack of Transportation (Non-Medical): No  Physical Activity: Sufficiently Active (11/06/2017)   Exercise Vital Sign    Days of Exercise per Week: 2 days    Minutes of Exercise per Session: 100 min  Stress: Stress Concern Present (11/06/2017)   [employer] of Occupational Health - Occupational Stress Questionnaire    Feeling of Stress : To some extent  Social Connections: Moderately Isolated (11/06/2017)   Social Connection and Isolation Panel [NHANES]    Frequency of Communication with Friends and Family: More than three times a week    Frequency of Social Gatherings with Friends and Family: Once a week    Attends Religious Services: Never    Database administrator or Organizations: No    Attends Banker Meetings: Never    Marital Status: Divorced  Catering manager Violence: Not At Risk (11/06/2017)   Humiliation, Afraid, Rape, and Kick questionnaire    Fear of Current or Ex-Partner: No    Emotionally Abused: No    Physically Abused: No    Sexually Abused: No    Family History  Problem Relation Age of Onset   Kidney disease Sister    Diabetes Sister    COPD Sister    Stroke Sister    Thyroid disease Sister    Diabetes Mother    Stroke Mother    Heart attack Mother    Thyroid disease Mother    Colon cancer Mother    Stroke Father    Heart attack Father    Asthma Daughter    Hypertension Daughter    Thyroid disease Daughter    Asthma Son    Heart disease Maternal Grandmother    Hypertension Maternal Grandmother    Colon cancer Maternal Grandfather    Hypertension Maternal Grandfather    Hyperlipidemia Maternal Grandfather    Stroke Paternal Grandmother    Hypertension Paternal Grandfather    Hyperlipidemia Paternal Grandfather    Heart disease Paternal Grandfather    Thyroid disease Sister    Migraines Sister     Allergies  Allergen Reactions   Aspirin Anaphylaxis, Hives and Swelling    Occurred as a child Occurred as a child   Beef Allergy  Hives   Celebrex [Celecoxib] Anaphylaxis and Hives   Fentanyl Anaphylaxis   Fluticasone Propionate Other (See Comments) and Swelling    Swelling, pain, and itching   Gabapentin Shortness Of Breath   Hydrocodone-Acetaminophen Hives, Other (See Comments) and Swelling    Feels like her tongue swells. Trouble swallowing   Latex Hives   Lisinopril Shortness Of Breath   Morphine Anaphylaxis and Swelling   Oxycodone-Acetaminophen Hives and Shortness Of Breath   Percocet [Oxycodone-Acetaminophen] Hives and Shortness Of  Breath   Pork Allergy Hives   Simvastatin Hives and Shortness Of Breath    Can tolerate 20 mg dose.   Skelaxin [Metaxalone] Anaphylaxis   Beef-Derived Products Hives   Pork-Derived Products Hives   Vicodin [Hydrocodone-Acetaminophen] Hives and Other (See Comments)    Feels like her tongue swells. Trouble swallowing   Albuterol Palpitations   Jardiance [Empagliflozin] Nausea Only and Palpitations    Outpatient Medications Prior to Visit  Medication Sig   blood glucose meter kit and supplies KIT Dispense based on patient and insurance preference. Use up to four times daily as directed. (FOR ICD-9 250.00, 250.01).   Blood Pressure Monitoring (BLOOD PRESSURE KIT) KIT 1 kit by Does not apply route daily as needed.   clopidogrel (PLAVIX) 75 MG tablet Take 1 tablet (75 mg total) by mouth once daily.   diphenhydrAMINE (BENADRYL) 50 MG tablet Take 50 mg by mouth 2 (two) times daily.   EPINEPHrine 0.3 mg/0.3 mL IJ SOAJ injection Inject 0.3 into the muscle as needed for anaphylaxis   Insulin Pen Needle 32G X 6 MM MISC Use one pen needle with Ozempic once every week   isosorbide mononitrate (IMDUR) 60 MG 24 hr tablet Take 1 tablet (60 mg total) by mouth daily.   metoprolol tartrate (LOPRESSOR) 25 MG tablet Take 1 tablet (25 mg total) by mouth 2 (two) times daily.   nitroGLYCERIN (NITROSTAT) 0.4 MG SL tablet 1 TABLET UNDER TONGUE AS NEEDED FOR CHEST PAIN EVERY 5 MINUTES FOR MAX OF 3  DOSES IN 15 MINUTES; IF NO RELIEF AFTER 1ST DOSE CALL 911   pantoprazole (PROTONIX) 40 MG tablet Take 1 tablet (40 mg total) by mouth once daily.   potassium chloride SA (KLOR-CON M) 20 MEQ tablet Take 1 tablet (20 mEq total) by mouth 2 (two) times daily.   ranolazine (RANEXA) 500 MG 12 hr tablet Take 1 tablet (500 mg total) by mouth 2 (two) times daily.   sacubitril-valsartan (ENTRESTO) 24-26 MG Take 1 tablet by mouth 2 (two) times daily.   Semaglutide, 1 MG/DOSE, (OZEMPIC, 1 MG/DOSE,) 4 MG/3ML SOPN Inject 1 mg into the skin once a week. Instead of Trulicity   simvastatin (ZOCOR) 20 MG tablet Take 1 tablet (20 mg total) by mouth once daily.   spironolactone (ALDACTONE) 25 MG tablet Take 1 tablet (25 mg total) by mouth daily.   torsemide (DEMADEX) 20 MG tablet Take 3 tablets (60 mg total) by mouth daily.   [DISCONTINUED] glucose blood (RIGHTEST GS550 BLOOD GLUCOSE) test strip Use as directed to test blood sugar.   [DISCONTINUED] Rightest GL300 Lancets MISC Use as directed to test blood sugar.   No facility-administered medications prior to visit.    Review of Systems  Constitutional: Negative.  Negative for weight loss.  HENT:  Positive for hearing loss.   Eyes: Negative.   Respiratory: Negative.    Cardiovascular: Negative.   Gastrointestinal: Negative.   Genitourinary: Negative.   Musculoskeletal:  Positive for joint pain.  Skin: Negative.   Neurological: Negative.   Endo/Heme/Allergies: Negative.        Objective:   BP 110/75   Pulse 74   Ht 5\' 7"  (1.702 m)   Wt 280 lb 11.2 oz (127.3 kg)   LMP 06/09/2018   SpO2 94%   BMI 43.96 kg/m   Vitals:   02/26/23 1516  BP: 110/75  Pulse: 74  Height: 5\' 7"  (1.702 m)  Weight: 280 lb 11.2 oz (127.3 kg)  SpO2: 94%  BMI (Calculated): 43.95  Physical Exam Vitals reviewed.  Constitutional:      General: She is not in acute distress.    Appearance: She is obese.  HENT:     Head: Normocephalic.     Right Ear: Decreased  hearing noted.     Left Ear: Decreased hearing noted.     Nose: Nose normal.     Mouth/Throat:     Mouth: Mucous membranes are moist.  Eyes:     Extraocular Movements: Extraocular movements intact.     Pupils: Pupils are equal, round, and reactive to light.  Cardiovascular:     Rate and Rhythm: Normal rate and regular rhythm.     Heart sounds: No murmur heard. Pulmonary:     Effort: Pulmonary effort is normal.     Breath sounds: No rhonchi or rales.  Abdominal:     General: Abdomen is flat.     Palpations: There is no hepatomegaly, splenomegaly or mass.  Musculoskeletal:     Right shoulder: Decreased range of motion.     Left shoulder: Decreased range of motion.     Cervical back: Normal range of motion. No tenderness.  Skin:    General: Skin is warm and dry.  Neurological:     General: No focal deficit present.     Mental Status: She is alert and oriented to person, place, and time.     Cranial Nerves: No cranial nerve deficit.     Motor: No weakness.  Psychiatric:        Mood and Affect: Mood normal.        Behavior: Behavior normal.      Results for orders placed or performed in visit on 02/26/23  POCT CBG (Fasting - Glucose)  Result Value Ref Range   Glucose Fasting, POC 125 (A) 70 - 99 mg/dL    Recent Results (from the past 2160 hour([place] [place]))  Hemoglobin A1c     Status: Abnormal   Collection Time: 02/23/23  1:02 PM  Result Value Ref Range   Hgb A1c MFr Bld 5.7 (H) 4.8 - 5.6 %    Comment:          Prediabetes: 5.7 - 6.4          Diabetes: >6.4          Glycemic control for adults with diabetes: <7.0    Est. average glucose Bld gHb Est-mCnc 117 mg/dL  Comprehensive metabolic panel     Status: None   Collection Time: 02/23/23  1:02 PM  Result Value Ref Range   Glucose 98 70 - 99 mg/dL   BUN 12 6 - 24 mg/dL   Creatinine, Ser 2.13 0.57 - 1.00 mg/dL   eGFR 69 >08 MV/HQI/6.96   BUN/Creatinine Ratio 12 9 - 23   Sodium 139 134 - 144 mmol/L   Potassium 4.5 3.5 - 5.2  mmol/L   Chloride 100 96 - 106 mmol/L   CO2 24 20 - 29 mmol/L   Calcium 9.0 8.7 - 10.2 mg/dL   Total Protein 6.7 6.0 - 8.5 g/dL   Albumin 4.2 3.8 - 4.9 g/dL   Globulin, Total 2.5 1.5 - 4.5 g/dL   Bilirubin Total 0.4 0.0 - 1.2 mg/dL   Alkaline Phosphatase 100 44 - 121 IU/L   AST 16 0 - 40 IU/L   ALT 17 0 - 32 IU/L  Lipid panel     Status: Abnormal   Collection Time: 02/23/23  1:02 PM  Result Value Ref Range   Cholesterol, Total  160 100 - 199 mg/dL   Triglycerides 981 (H) 0 - 149 mg/dL   HDL 33 (L) >19 mg/dL   VLDL Cholesterol Cal 28 5 - 40 mg/dL   LDL Chol Calc (NIH) 99 0 - 99 mg/dL   Chol/HDL Ratio 4.8 (H) 0.0 - 4.4 ratio    Comment:                                   T. Chol/HDL Ratio                                             Men  Women                               1/2 Avg.Risk  3.4    3.3                                   Avg.Risk  5.0    4.4                                2X Avg.Risk  9.6    7.1                                3X Avg.Risk 23.4   11.0   POCT CBG (Fasting - Glucose)     Status: Abnormal   Collection Time: 02/26/23  3:23 PM  Result Value Ref Range   Glucose Fasting, POC 125 (A) 70 - 99 mg/dL      Assessment & Plan:   Problem List Items Addressed This Visit       Endocrine   Type 2 diabetes mellitus without complications (HCC) - Primary   Relevant Medications   Blood Glucose Monitoring Suppl (ACCU-CHEK GUIDE) w/Device KIT   glucose blood (ACCU-CHEK GUIDE TEST) test strip   Accu-Chek Softclix Lancets lancets   Accu-Chek Softclix Lancets lancets   Other Relevant Orders   POCT CBG (Fasting - Glucose) (Completed)   Comprehensive metabolic panel   Hemoglobin A1c   Lipid panel     Nervous and Auditory   Hearing loss   Relevant Orders   Ambulatory referral to ENT     Other   Obesity   Relevant Orders   Ambulatory referral to Sleep Studies   MM 3D SCREENING MAMMOGRAM BILATERAL BREAST    Return in about 3 months (around 05/29/2023) for fu with  labs prior.   Total time spent: 20 minutes  Luna Fuse, MD  02/26/2023   This document may have been prepared by [facility] Voice Recognition software and as such may include unintentional dictation errors.

## 2023-02-27 ENCOUNTER — Ambulatory Visit: Payer: Self-pay | Admitting: Gerontology

## 2023-03-20 ENCOUNTER — Other Ambulatory Visit: Payer: Self-pay

## 2023-03-28 ENCOUNTER — Other Ambulatory Visit: Payer: Self-pay

## 2023-04-13 ENCOUNTER — Other Ambulatory Visit: Payer: Self-pay

## 2023-04-15 ENCOUNTER — Other Ambulatory Visit: Payer: Self-pay

## 2023-05-03 NOTE — Progress Notes (Signed)
PCP: Gillermo Murdoch, MD (last seen 11/24) Primary Cardiologist: Lorine Bears, MD (last seen 11/24)  Chief Complaint: shortness of breath  HPI:  Kathy [name] is a 58 y/o female with a history of asthma, CAD (mild nonobstructive), chronic chest pain, hyperlipidemia, HTN, seizures, obesity, DM, previous tobacco use and chronic heart failure.   Has not been admitted or been in the ED in the last 6 months.   Echo 02/27/17: EF of 55-60%. Echo 01/06/20: EF of 55-60% along with mild LVH and mild MR Echo 06/14/20: EF of 50-55% along with mild LVH. Echo 06/08/22: EF 40-45% along with mild/moderate MR.    RHC/LHC 07/04/22:   There is mild left ventricular systolic dysfunction.   LV end diastolic pressure is normal.   The left ventricular ejection fraction is 50-55% by visual estimate.  1.  Minimal nonobstructive coronary artery disease. 2.  Mildly reduced LV systolic function with an EF of 50%. 3.  Right heart catheterization showed normal filling pressures, normal pulmonary pressure and normal cardiac output.  She presents today for a HF follow up visit with a chief complaint of minimal shortness of breath with moderate exertion. She notices this if she has to walk a long distance such as around [employer]. Has associated minimal fatigue, intermittent chest pain or "pinching" a couple of weeks ago when cleaning the house, occasional palpitations and occasional dizziness along with this. Denies cough, abdominal distention, pedal edema, weight gain or difficulty sleeping. 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   She brought her weight chart, BP log and diabetes log for review.                         ROS: All systems negative except as listed in HPI, PMH and Problem List.  SH:  Social History   Socioeconomic History   Marital status: Divorced    Spouse name: Not on file   Number of children: 2   Years of education: pre-requisites    Highest education level: GED or  equivalent  Occupational History   Occupation: unemployed  Tobacco Use   Smoking status: Former    Current packs/day: 0.00    Average packs/day: 1 pack/day for 30.0 years (30.0 ttl pk-yrs)    Types: Cigarettes    Start date: 08/17/1987    Quit date: 08/16/2017    Years since quitting: 5.7   Smokeless tobacco: Never  Vaping Use   Vaping status: Never Used  Substance and Sexual Activity   Alcohol use: Not Currently    Comment: last use 01/2022   Drug use: Not Currently    Comment: Previously used cocaine in the 1990s   Sexual activity: Yes    Birth control/protection: Condom  Other Topics Concern   Not on file  Social History Narrative   Currently on food stamps. They help somewhat but still difficult to buy food that she needs. Boyfriend helps pay rent with disability, they live together. Roof is leaking really badly. Lives in modular home. Social services bought tarp which helped, but it deteriorated from heat.    Social Drivers of Health   Financial Resource Strain: High Risk (02/12/2018)   Overall Financial Resource Strain (CARDIA)    Difficulty of Paying Living Expenses: Hard  Food Insecurity: No Food Insecurity (10/20/2021)   Hunger Vital Sign    Worried About Running Out of Food in the Last Year: Never true    Ran Out of  Food in the Last Year: Never true  Transportation Needs: No Transportation Needs (10/20/2021)   PRAPARE - Administrator, Civil Service (Medical): No    Lack of Transportation (Non-Medical): No  Physical Activity: Sufficiently Active (11/06/2017)   Exercise Vital Sign    Days of Exercise per Week: 2 days    Minutes of Exercise per Session: 100 min  Stress: Stress Concern Present (11/06/2017)   [employer] of Occupational Health - Occupational Stress Questionnaire    Feeling of Stress : To some extent  Social Connections: Moderately Isolated (11/06/2017)   Social Connection and Isolation Panel [NHANES]    Frequency of Communication with  Friends and Family: More than three times a week    Frequency of Social Gatherings with Friends and Family: Once a week    Attends Religious Services: Never    Database administrator or Organizations: No    Attends Banker Meetings: Never    Marital Status: Divorced  Catering manager Violence: Not At Risk (11/06/2017)   Humiliation, Afraid, Rape, and Kick questionnaire    Fear of Current or Ex-Partner: No    Emotionally Abused: No    Physically Abused: No    Sexually Abused: No    FH:  Family History  Problem Relation Age of Onset   Kidney disease Sister    Diabetes Sister    COPD Sister    Stroke Sister    Thyroid disease Sister    Diabetes Mother    Stroke Mother    Heart attack Mother    Thyroid disease Mother    Colon cancer Mother    Stroke Father    Heart attack Father    Asthma Daughter    Hypertension Daughter    Thyroid disease Daughter    Asthma Son    Heart disease Maternal Grandmother    Hypertension Maternal Grandmother    Colon cancer Maternal Grandfather    Hypertension Maternal Grandfather    Hyperlipidemia Maternal Grandfather    Stroke Paternal Grandmother    Hypertension Paternal Grandfather    Hyperlipidemia Paternal Grandfather    Heart disease Paternal Grandfather    Thyroid disease Sister    Migraines Sister     Past Medical History:  Diagnosis Date   (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 02/2016 Echo: EF 45%; b. 03/2016 Echo: EF 55%; c. 12/2019 Echo: EF 55-60%, no rwma, gr2 DD, nl RV size/fxn, mildly dil RA, mild MR.   Asthma    Chronic chest pain    a. ? vasospasm vs endothelial dysfxn. Improved w/ Ranexa but couldn't afford.   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 Presumed in setting of prior h/o MI w/ insignificant coronary dzs.   Diabetes mellitus without complication (HCC)    Heart murmur    History of NICM (nonischemic cardiomyopathy) (HCC)    a. 02/2016 Echo: EF 45%; b. 12/2019 Echo: EF  55-60%.   Hyperlipidemia    Hypertension    Myocardial infarction [facility])    Non-obstructive Coronary artery disease    a. Prior caths in 2007, 2011, 2012 - nonobs dzs; b. 03/2016 Cath ([facility]): LM nl, LAD/LCX/RCA insignificant dzs.   Obesity    Rate-dependent LBBB (left bundle branch block)    Remote Tobacco abuse    a. 30+ pack year history - quit 2019.   Seizures (HCC)     Current Outpatient Medications  Medication Sig Dispense Refill   Accu-Chek Softclix  Lancets lancets Use as instructed 100 each 12   Accu-Chek Softclix Lancets lancets Use as instructed 100 each 12   blood glucose meter kit and supplies KIT Dispense based on patient and insurance preference. Use up to four times daily as directed. (FOR ICD-9 250.00, 250.01). 1 each 0   Blood Pressure Monitoring (BLOOD PRESSURE KIT) KIT 1 kit by Does not apply route daily as needed. 1 kit 0   clopidogrel (PLAVIX) 75 MG tablet Take 1 tablet (75 mg total) by mouth once daily. 90 tablet 3   diphenhydrAMINE (BENADRYL) 50 MG tablet Take 50 mg by mouth 2 (two) times daily.     EPINEPHrine 0.3 mg/0.3 mL IJ SOAJ injection Inject 0.3 into the muscle as needed for anaphylaxis 4 each 6   glucose blood (ACCU-CHEK GUIDE TEST) test strip Use as instructed 100 each 12   Insulin Pen Needle 32G X 6 MM MISC Use one pen needle with Ozempic once every week 100 each 3   isosorbide mononitrate (IMDUR) 60 MG 24 hr tablet Take 1 tablet (60 mg total) by mouth daily. 90 tablet 3   metFORMIN (GLUCOPHAGE-XR) 750 MG 24 hr tablet Take 1 tablet (750 mg total) by mouth daily with breakfast. 90 tablet 0   metoprolol tartrate (LOPRESSOR) 25 MG tablet Take 1 tablet (25 mg total) by mouth 2 (two) times daily. 180 tablet 3   nitroGLYCERIN (NITROSTAT) 0.4 MG SL tablet 1 TABLET UNDER TONGUE AS NEEDED FOR CHEST PAIN EVERY 5 MINUTES FOR MAX OF 3 DOSES IN 15 MINUTES; IF NO RELIEF AFTER 1ST DOSE CALL 911 25 tablet 0   pantoprazole (PROTONIX) 40 MG tablet Take 1 tablet (40 mg total)  by mouth once daily. 90 tablet 3   potassium chloride SA (KLOR-CON M) 20 MEQ tablet Take 1 tablet (20 mEq total) by mouth 2 (two) times daily. 180 tablet 3   ranolazine (RANEXA) 500 MG 12 hr tablet Take 1 tablet (500 mg total) by mouth 2 (two) times daily. 180 tablet 3   sacubitril-valsartan (ENTRESTO) 24-26 MG Take 1 tablet by mouth 2 (two) times daily. 180 tablet 3   Semaglutide, 1 MG/DOSE, (OZEMPIC, 1 MG/DOSE,) 4 MG/3ML SOPN Inject 1 mg into the skin once a week. Instead of Trulicity 3 mL 6   simvastatin (ZOCOR) 20 MG tablet Take 1 tablet (20 mg total) by mouth once daily. 90 tablet 1   spironolactone (ALDACTONE) 25 MG tablet Take 1 tablet (25 mg total) by mouth daily. 90 tablet 3   torsemide (DEMADEX) 20 MG tablet Take 3 tablets (60 mg total) by mouth daily. 270 tablet 3   No current facility-administered medications for this visit.   Vitals:   05/07/23 1518  BP: 122/64  Pulse: 66  SpO2: 98%  Weight: 280 lb (127 kg)   Wt Readings from Last 3 Encounters:  05/07/23 280 lb (127 kg)  02/26/23 280 lb 11.2 oz (127.3 kg)  02/15/23 280 lb 9.6 oz (127.3 kg)   Lab Results  Component Value Date   CREATININE 0.97 02/23/2023   CREATININE 0.97 09/27/2022   CREATININE 0.93 08/09/2022   PHYSICAL EXAM:  General:  Well appearing. No resp difficulty HEENT: normal Neck: supple. JVP flat. No lymphadenopathy or thryomegaly appreciated. Cor: PMI normal. Regular rate & rhythm. No rubs, gallops or murmurs. Lungs: clear Abdomen: soft, nontender, nondistended. No hepatosplenomegaly. No bruits or masses.  Extremities: no cyanosis, clubbing, rash, edema Neuro: alert & oriented x3, cranial nerves grossly intact. Moves all 4 extremities w/o  difficulty. Affect pleasant.   ECG: not done   ASSESSMENT & PLAN:  1: NICM with mildly reduced ejection fraction- - etiology likely LBBB as cath showed nonobstructive CAD - NYHA class II - euvolemic today  - weighing daily & home chart shows weight 276-280  pounds; reminded to call for an overnight weight gain of >2 pounds or a weekly weight gain of >5 pounds - weight unchanged from last visit here 4 months ago - Echo 02/27/17: EF of 55-60%. - Echo 01/06/20: EF of 55-60% along with mild LVH and mild MR - Echo 06/14/20: EF of 50-55% along with mild LVH.  - Echo 06/08/22: EF 40-45% along with mild/moderate MR.  - will get echo updated in a couple of months - has been following a daily 2L fluid intake along with closely following a 2000mg  sodium diet - continue metoprolol tartrate 25mg  BID (due to palpitations) - continue potassium BID - continue entresto 24/26mg  BID; BP may not tolerate titration as she's getting some low readings at home - continue spironolactone 25mg  daily - continue torsemide 60mg  daily - jardiance caused HA, nausea and increased thirst so this was stopped - BNP 06/12/20 was 45.8  2: HTN- - BP 122/64 - home BP running 95-114/55-68 - saw PCP (Tejan-Sie) 11/24 - BMP 02/23/23 reviewed and showed sodium 139, potassium 4.5, creatinine 0.97 and GFR 69  3: DM- - A1c 02/23/23 was 5.7% - continue metformin XR 750mg  daily - continue ozempic 1mg  weekly - home glucose running 99-116  4: Nonobstructive CAD- - saw cardiology Kirke Corin) 11/24 - continue plavix 75mg  daily - continue isosorbide MN 60mg  daily  - continue metoprolol tartrate 25mg  BID - continue ranexa 500mg  BID - RHC/LHC 07/04/22:   There is mild left ventricular systolic dysfunction.   LV end diastolic pressure is normal.   The left ventricular ejection fraction is 50-55% by visual estimate.  1.  Minimal nonobstructive coronary artery disease. 2.  Mildly reduced LV systolic function with an EF of 50%. 3.  Right heart catheterization showed normal filling pressures, normal pulmonary pressure and normal cardiac output.  5: Palpitations- - holter monitor 08/24 showed min HR of 43 bpm, max HR of 158 bpm, and avg HR of 70 bpm. Predominant underlying rhythm was Sinus  Rhythm. Bundle Branch Block/IVCD was present. 1 run of Supraventricular Tachycardia occurred lasting 4 beats with a max rate of 158 bpm (avg 140 bpm). Isolated SVEs were rare (<1.0%), SVE Couplets were rare (<1.0%), and SVE Triplets were rare (<1.0%). No Isolated VEs, VE Couplets, or VE Triplets were present.  Return 1 week after echo done, sooner if needed.

## 2023-05-07 ENCOUNTER — Ambulatory Visit: Payer: Medicaid Other | Attending: Family | Admitting: Family

## 2023-05-07 ENCOUNTER — Encounter: Payer: Self-pay | Admitting: Family

## 2023-05-07 VITALS — BP 122/64 | HR 66 | Wt 280.0 lb

## 2023-05-07 DIAGNOSIS — Z5986 Financial insecurity: Secondary | ICD-10-CM | POA: Insufficient documentation

## 2023-05-07 DIAGNOSIS — I10 Essential (primary) hypertension: Secondary | ICD-10-CM

## 2023-05-07 DIAGNOSIS — R0789 Other chest pain: Secondary | ICD-10-CM | POA: Insufficient documentation

## 2023-05-07 DIAGNOSIS — E669 Obesity, unspecified: Secondary | ICD-10-CM | POA: Diagnosis not present

## 2023-05-07 DIAGNOSIS — G8929 Other chronic pain: Secondary | ICD-10-CM | POA: Insufficient documentation

## 2023-05-07 DIAGNOSIS — I110 Hypertensive heart disease with heart failure: Secondary | ICD-10-CM | POA: Insufficient documentation

## 2023-05-07 DIAGNOSIS — E785 Hyperlipidemia, unspecified: Secondary | ICD-10-CM | POA: Diagnosis not present

## 2023-05-07 DIAGNOSIS — Z7984 Long term (current) use of oral hypoglycemic drugs: Secondary | ICD-10-CM | POA: Diagnosis not present

## 2023-05-07 DIAGNOSIS — Z8709 Personal history of other diseases of the respiratory system: Secondary | ICD-10-CM | POA: Insufficient documentation

## 2023-05-07 DIAGNOSIS — Z7985 Long-term (current) use of injectable non-insulin antidiabetic drugs: Secondary | ICD-10-CM | POA: Insufficient documentation

## 2023-05-07 DIAGNOSIS — R002 Palpitations: Secondary | ICD-10-CM | POA: Insufficient documentation

## 2023-05-07 DIAGNOSIS — Z7902 Long term (current) use of antithrombotics/antiplatelets: Secondary | ICD-10-CM | POA: Diagnosis not present

## 2023-05-07 DIAGNOSIS — R569 Unspecified convulsions: Secondary | ICD-10-CM | POA: Insufficient documentation

## 2023-05-07 DIAGNOSIS — I5022 Chronic systolic (congestive) heart failure: Secondary | ICD-10-CM | POA: Insufficient documentation

## 2023-05-07 DIAGNOSIS — Z79899 Other long term (current) drug therapy: Secondary | ICD-10-CM | POA: Diagnosis not present

## 2023-05-07 DIAGNOSIS — I2510 Atherosclerotic heart disease of native coronary artery without angina pectoris: Secondary | ICD-10-CM | POA: Diagnosis not present

## 2023-05-07 DIAGNOSIS — I428 Other cardiomyopathies: Secondary | ICD-10-CM | POA: Diagnosis not present

## 2023-05-07 DIAGNOSIS — E119 Type 2 diabetes mellitus without complications: Secondary | ICD-10-CM | POA: Insufficient documentation

## 2023-05-07 DIAGNOSIS — Z87891 Personal history of nicotine dependence: Secondary | ICD-10-CM | POA: Diagnosis not present

## 2023-05-07 NOTE — Patient Instructions (Signed)
Please have your echo completed. You will check in for this at the [facility]. You have to arrive 15 MINS EARLY for preparation, otherwise you will have to reschedule.  Britta Mccreedy, our scheduler is already gone for the day. Please call back tomorrow at [phone] so we can get you scheduled for your Echo and your follow up.

## 2023-05-13 ENCOUNTER — Other Ambulatory Visit: Payer: Self-pay | Admitting: Internal Medicine

## 2023-05-13 ENCOUNTER — Other Ambulatory Visit: Payer: Self-pay | Admitting: Cardiovascular Disease

## 2023-05-13 DIAGNOSIS — E119 Type 2 diabetes mellitus without complications: Secondary | ICD-10-CM

## 2023-05-13 NOTE — Progress Notes (Unsigned)
Sleep Medicine   Office Visit  Patient Name: Kathy [name] DOB: 1966-02-06 MRN [id number]    Chief Complaint: sleep evaluation   Brief History:  Kathy [name] presents for an initial consult for sleep evaluation and to establish care. Patient has a at least 2 year history of insomnia and inability to go to sleep and stay asleep. Sleep quality is poor. This is noted most nights. The patient's bed partner reports  snoring, gasping, apneic pauses and choking at night. The patient relates the following symptoms: some headaches and fatigue are also present. The patient reports never being able to sleep at night but will finally be able to sleep in the morning for a few hours.  Sleep quality is worse when outside home environment.  Patient has noted significant movement of her legs at night that would disrupt her sleep.  The patient  relates sleep talking as unusual behavior during the night.  The patient relates  no history of psychiatric problems. The Epworth Sleepiness Score is 0 out of 24 .  The patient relates  Cardiovascular risk factors include: congestive heart failure, hypertension.    ROS  General: (-) fever, (-) chills, (-) night sweat Nose and Sinuses: (-) nasal stuffiness or itchiness, (-) postnasal drip, (-) nosebleeds, (-) sinus trouble. Mouth and Throat: (-) sore throat, (-) hoarseness. Neck: (-) swollen glands, (-) enlarged thyroid, (-) neck pain. Respiratory: + cough, + shortness of breath, + wheezing. Neurologic: - numbness, - tingling. Psychiatric: +anxiety, - depression Sleep behavior: -sleep paralysis -hypnogogic hallucinations -dream enactment      -vivid dreams -cataplexy -night terrors -sleep walking   Current Medication: Outpatient Encounter Medications as of 05/14/2023  Medication Sig   Accu-Chek Softclix Lancets lancets Use as instructed   Accu-Chek Softclix Lancets lancets Use as instructed   blood glucose meter kit and supplies KIT Dispense based on patient and  insurance preference. Use up to four times daily as directed. (FOR ICD-9 250.00, 250.01).   Blood Pressure Monitoring (BLOOD PRESSURE KIT) KIT 1 kit by Does not apply route daily as needed.   clopidogrel (PLAVIX) 75 MG tablet Take 1 tablet (75 mg total) by mouth once daily.   diphenhydrAMINE (BENADRYL) 50 MG tablet Take 50 mg by mouth 2 (two) times daily.   EPINEPHrine 0.3 mg/0.3 mL IJ SOAJ injection Inject 0.3 into the muscle as needed for anaphylaxis   glucose blood (ACCU-CHEK GUIDE TEST) test strip Use as instructed   Insulin Pen Needle 32G X 6 MM MISC Use one pen needle with Ozempic once every week   isosorbide mononitrate (IMDUR) 60 MG 24 hr tablet Take 1 tablet (60 mg total) by mouth daily.   metFORMIN (GLUCOPHAGE-XR) 750 MG 24 hr tablet Take 1 tablet (750 mg total) by mouth daily with breakfast.   metoprolol tartrate (LOPRESSOR) 25 MG tablet Take 1 tablet (25 mg total) by mouth 2 (two) times daily.   nitroGLYCERIN (NITROSTAT) 0.4 MG SL tablet 1 TABLET UNDER TONGUE AS NEEDED FOR CHEST PAIN EVERY 5 MINUTES FOR MAX OF 3 DOSES IN 15 MINUTES; IF NO RELIEF AFTER 1ST DOSE CALL 911   pantoprazole (PROTONIX) 40 MG tablet Take 1 tablet (40 mg total) by mouth once daily.   potassium chloride SA (KLOR-CON M) 20 MEQ tablet Take 1 tablet (20 mEq total) by mouth 2 (two) times daily.   ranolazine (RANEXA) 500 MG 12 hr tablet Take 1 tablet (500 mg total) by mouth 2 (two) times daily.   sacubitril-valsartan (ENTRESTO) 24-26 MG Take 1  tablet by mouth 2 (two) times daily.   Semaglutide, 1 MG/DOSE, (OZEMPIC, 1 MG/DOSE,) 4 MG/3ML SOPN Inject 1 mg into the skin once a week. Instead of Trulicity   simvastatin (ZOCOR) 20 MG tablet Take 1 tablet (20 mg total) by mouth once daily.   spironolactone (ALDACTONE) 25 MG tablet Take 1 tablet (25 mg total) by mouth daily.   torsemide (DEMADEX) 20 MG tablet Take 3 tablets (60 mg total) by mouth daily.   [DISCONTINUED] clopidogrel (PLAVIX) 75 MG tablet Take 1 tablet (75 mg  total) by mouth once daily.   [DISCONTINUED] metFORMIN (GLUCOPHAGE-XR) 750 MG 24 hr tablet Take 1 tablet (750 mg total) by mouth daily with breakfast.   [DISCONTINUED] ranolazine (RANEXA) 500 MG 12 hr tablet Take 1 tablet (500 mg total) by mouth 2 (two) times daily.   No facility-administered encounter medications on file as of 05/14/2023.    Surgical History: Past Surgical History:  Procedure Laterality Date   ANTERIOR CRUCIATE LIGAMENT REPAIR     BACK SURGERY     L4 and L5   CARDIAC CATHETERIZATION  04/09/2016   COLONOSCOPY     COLONOSCOPY WITH PROPOFOL N/A 08/04/2019   Procedure: COLONOSCOPY WITH PROPOFOL;  Surgeon: Toney Reil, MD;  Location: [facility] ENDOSCOPY;  Service: Gastroenterology;  Laterality: N/A;   EMPYEMA DRAINAGE     at age 58   RIGHT/LEFT HEART CATH AND CORONARY ANGIOGRAPHY Bilateral 07/04/2022   Procedure: RIGHT/LEFT HEART CATH AND CORONARY ANGIOGRAPHY;  Surgeon: Iran Ouch, MD;  Location: [facility] INVASIVE CV LAB;  Service: Cardiovascular;  Laterality: Bilateral;   TONSILLECTOMY     TONSILLECTOMY AND ADENOIDECTOMY  2008   TUBAL LIGATION      Medical History: Past Medical History:  Diagnosis Date   (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 02/2016 Echo: EF 45%; b. 03/2016 Echo: EF 55%; c. 12/2019 Echo: EF 55-60%, no rwma, gr2 DD, nl RV size/fxn, mildly dil RA, mild MR.   Asthma    Chronic chest pain    a. ? vasospasm vs endothelial dysfxn. Improved w/ Ranexa but couldn't afford.   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 Presumed in setting of prior h/o MI w/ insignificant coronary dzs.   Diabetes mellitus without complication (HCC)    Heart murmur    History of NICM (nonischemic cardiomyopathy) (HCC)    a. 02/2016 Echo: EF 45%; b. 12/2019 Echo: EF 55-60%.   Hyperlipidemia    Hypertension    Myocardial infarction [facility])    Non-obstructive Coronary artery disease    a. Prior caths in 2007, 2011, 2012 - nonobs dzs; b. 03/2016 Cath  ([facility]): LM nl, LAD/LCX/RCA insignificant dzs.   Obesity    Rate-dependent LBBB (left bundle branch block)    Remote Tobacco abuse    a. 30+ pack year history - quit 2019.   Seizures (HCC)     Family History: Non contributory to the present illness  Social History: Social History   Socioeconomic History   Marital status: Divorced    Spouse name: Not on file   Number of children: 2   Years of education: pre-requisites    Highest education level: GED or equivalent  Occupational History   Occupation: unemployed  Tobacco Use   Smoking status: Former    Current packs/day: 0.00    Average packs/day: 1 pack/day for 30.0 years (30.0 ttl pk-yrs)    Types: Cigarettes    Start date: 08/17/1987    Quit date: 08/16/2017  Years since quitting: 5.7   Smokeless tobacco: Never  Vaping Use   Vaping status: Never Used  Substance and Sexual Activity   Alcohol use: Not Currently    Comment: last use 01/2022   Drug use: Not Currently    Comment: Previously used cocaine in the 1990s   Sexual activity: Yes    Birth control/protection: Condom  Other Topics Concern   Not on file  Social History Narrative   Currently on food stamps. They help somewhat but still difficult to buy food that she needs. Boyfriend helps pay rent with disability, they live together. Roof is leaking really badly. Lives in modular home. Social services bought tarp which helped, but it deteriorated from heat.    Social Drivers of Corporate investment banker Strain: High Risk (02/12/2018)   Overall Financial Resource Strain (CARDIA)    Difficulty of Paying Living Expenses: Hard  Food Insecurity: No Food Insecurity (10/20/2021)   Hunger Vital Sign    Worried About Running Out of Food in the Last Year: Never true    Ran Out of Food in the Last Year: Never true  Transportation Needs: No Transportation Needs (10/20/2021)   PRAPARE - Administrator, Civil Service (Medical): No    Lack of Transportation  (Non-Medical): No  Physical Activity: Sufficiently Active (11/06/2017)   Exercise Vital Sign    Days of Exercise per Week: 2 days    Minutes of Exercise per Session: 100 min  Stress: Stress Concern Present (11/06/2017)   [employer] of Occupational Health - Occupational Stress Questionnaire    Feeling of Stress : To some extent  Social Connections: Moderately Isolated (11/06/2017)   Social Connection and Isolation Panel [NHANES]    Frequency of Communication with Friends and Family: More than three times a week    Frequency of Social Gatherings with Friends and Family: Once a week    Attends Religious Services: Never    Database administrator or Organizations: No    Attends Banker Meetings: Never    Marital Status: Divorced  Catering manager Violence: Not At Risk (11/06/2017)   Humiliation, Afraid, Rape, and Kick questionnaire    Fear of Current or Ex-Partner: No    Emotionally Abused: No    Physically Abused: No    Sexually Abused: No    Vital Signs: Blood pressure (!) 150/98, pulse 75, resp. rate 18, height 5\' 7"  (1.702 m), weight 279 lb (126.6 kg), last menstrual period 06/09/2018, SpO2 97%. Body mass index is 43.7 kg/m.   Examination: General Appearance: The patient is well-developed, well-nourished, and in no distress. Neck Circumference: 44 cm Skin: Gross inspection of skin unremarkable. Head: normocephalic, no gross deformities. Eyes: no gross deformities noted. ENT: ears appear grossly normal Neurologic: Alert and oriented. No involuntary movements.    STOP BANG RISK ASSESSMENT S (snore) Have you been told that you snore?     YES   T (tired) Are you often tired, fatigued, or sleepy during the day?   YES  O (obstruction) Do you stop breathing, choke, or gasp during sleep? YES   P (pressure) Do you have or are you being treated for high blood pressure? YES   B (BMI) Is your body index greater than 35 kg/m? YES   A (age) Are you 64 years old or  older? YES   N (neck) Do you have a neck circumference greater than 16 inches?   YES   G (gender) Are you a  female? NO   TOTAL STOP/BANG "YES" ANSWERS 7                                                               A STOP-Bang score of 2 or less is considered low risk, and a score of 5 or more is high risk for having either moderate or severe OSA. For people who score 3 or 4, doctors may need to perform further assessment to determine how likely they are to have OSA.         EPWORTH SLEEPINESS SCALE:  Scale:  (0)= no chance of dozing; (1)= slight chance of dozing; (2)= moderate chance of dozing; (3)= high chance of dozing  Chance  Situtation    Sitting and reading: 0    Watching TV: 0    Sitting Inactive in public: 0    As a passenger in car: 0      Lying down to rest: 0    Sitting and talking: 0    Sitting quielty after lunch: 0    In a car, stopped in traffic: 0   TOTAL SCORE:   0 out of 24    SLEEP STUDIES:  None   LABS: Recent Results (from the past 2160 hours)  Hemoglobin A1c     Status: Abnormal   Collection Time: 02/23/23  1:02 PM  Result Value Ref Range   Hgb A1c MFr Bld 5.7 (H) 4.8 - 5.6 %    Comment:          Prediabetes: 5.7 - 6.4          Diabetes: >6.4          Glycemic control for adults with diabetes: <7.0    Est. average glucose Bld gHb Est-mCnc 117 mg/dL  Comprehensive metabolic panel     Status: None   Collection Time: 02/23/23  1:02 PM  Result Value Ref Range   Glucose 98 70 - 99 mg/dL   BUN 12 6 - 24 mg/dL   Creatinine, Ser 1.61 0.57 - 1.00 mg/dL   eGFR 69 >09 UE/AVW/0.98   BUN/Creatinine Ratio 12 9 - 23   Sodium 139 134 - 144 mmol/L   Potassium 4.5 3.5 - 5.2 mmol/L   Chloride 100 96 - 106 mmol/L   CO2 24 20 - 29 mmol/L   Calcium 9.0 8.7 - 10.2 mg/dL   Total Protein 6.7 6.0 - 8.5 g/dL   Albumin 4.2 3.8 - 4.9 g/dL   Globulin, Total 2.5 1.5 - 4.5 g/dL   Bilirubin Total 0.4 0.0 - 1.2 mg/dL   Alkaline Phosphatase 100 44 - 121  IU/L   AST 16 0 - 40 IU/L   ALT 17 0 - 32 IU/L  Lipid panel     Status: Abnormal   Collection Time: 02/23/23  1:02 PM  Result Value Ref Range   Cholesterol, Total 160 100 - 199 mg/dL   Triglycerides 119 (H) 0 - 149 mg/dL   HDL 33 (L) >14 mg/dL   VLDL Cholesterol Cal 28 5 - 40 mg/dL   LDL Chol Calc (NIH) 99 0 - 99 mg/dL   Chol/HDL Ratio 4.8 (H) 0.0 - 4.4 ratio    Comment:  T. Chol/HDL Ratio                                             Men  Women                               1/2 Avg.Risk  3.4    3.3                                   Avg.Risk  5.0    4.4                                2X Avg.Risk  9.6    7.1                                3X Avg.Risk 23.4   11.0   POCT CBG (Fasting - Glucose)     Status: Abnormal   Collection Time: 02/26/23  3:23 PM  Result Value Ref Range   Glucose Fasting, POC 125 (A) 70 - 99 mg/dL    Radiology: No results found.  No results found.  No results found.    Assessment and Plan: Patient Active Problem List   Diagnosis Date Noted   Insomnia 05/14/2023   Witnessed episode of apnea 05/14/2023   Nontraumatic incomplete tear of left rotator cuff 02/12/2023   Arthritis of knee, right 02/12/2023   Arthritis of left knee 02/12/2023   Groin pain, right 12/26/2022   Chronic systolic heart failure (HCC) 07/04/2022   Knee pain, chronic 07/21/2021   Elevated liver enzymes 12/21/2020   Anxiety due to invasive procedure 09/22/2020   Hospital discharge follow-up 06/15/2020   Acute on chronic diastolic CHF (congestive heart failure) (HCC) 06/12/2020   History of left bundle branch block (LBBB) 06/12/2020   Morbid obesity with BMI of 50.0-59.9, adult (HCC) 06/12/2020   CHF (congestive heart failure) (HCC) 06/12/2020   Healthcare maintenance 05/19/2020   Edema of both lower extremities 03/02/2020   Shoulder pain, left 01/01/2020   Pain of left calf 01/01/2020   Hearing loss 01/01/2020   Type 2 diabetes mellitus without  complications (HCC) 12/04/2019   Rash 12/04/2019   History of CHF (congestive heart failure) 06/12/2019   Cough 03/30/2016   Chest pain 02/24/2016   New onset left bundle branch block (LBBB) 11/05/2015   Elevated lymphocytes 06/03/2015   History of MI (myocardial infarction) 06/03/2015   Tobacco abuse 06/03/2015   Obesity 06/03/2015   Seizures (HCC) 11/26/2014   Hypertension 05/21/2014   CAD (coronary artery disease) 05/21/2014   Hyperlipidemia 05/21/2014  1. Witnessed episode of apnea (Primary) Patient evaluation suggests high risk of sleep disordered breathing due to gasping, observed apnea, choking, snoring.  Patient has comorbid cardiovascular risk factors including: congestive heart failure, hypertension, coronary artery disease which could be exacerbated by pathologic sleep-disordered breathing.  Suggest: PSG to assess/treat the patient's sleep disordered breathing. The patient was also counselled on weight loss to optimize sleep health.   2. Insomnia, unspecified type Relates to poor sleep hygiene and rumination from history.  Methods to improve your sleep habits -Keep a consistent wind down period  before your bedtime to help you relax. Do not do work-related activities around bedtime.  -Go to bed only when you are sleepy.  -Get out of bed at the same time every morning, even on holidays or weekends.  -If you haven't fallen asleep after lying in bed in 20 minutes, do something boring and relaxing with minimal light expo sure outside the bedroom. Return to bed when you are sleepy.  -While in bed, turn the clock away from you. It'll only remind you of something you probably don't want to know.  -No napping during the day, especially after 3 pm. If a nap is unavoidable, keep it under an hour.  -Your bedroom should be quiet and dark.  -Take a warm, relaxing bath or shower! A warm bath or shower prior to bedtime increasesyour body's temperature so the subsequent cooling off  period can help induce sleep.  -Exercise regularly, but not tough exercise within 6 hours or bedtime.  -Avoid eating large or fatty meals near bedtime.  -Avoid caffeine after lunch.    1 - Portions taken from http://yoursleep.HipsReplacement.fr.aspx   3. Acute on chronic diastolic CHF (congestive heart failure) (HCC) Stable, continue f/u with cardiology.   4. Primary hypertension Pressure elevated today but she is anxious. Followup with providers.   5. Morbid obesity  Obesity Counseling: Had a lengthy discussion regarding patients BMI and weight issues. Patient was instructed on portion control as well as increased activity. Also discussed caloric restrictions with trying to maintain intake less than 2000 Kcal. Discussions were made in accordance with the 5As of weight management. Simple actions such as not eating late and if able to, taking a walk is suggested.       General Counseling: I have discussed the findings of the evaluation and examination with Kathy [name].  I have also discussed any further diagnostic evaluation thatmay be needed or ordered today. Kathy [name] verbalizes understanding of the findings of todays visit. We also reviewed her medications today and discussed drug interactions and side effects including but not limited excessive drowsiness and altered mental states. We also discussed that there is always a risk not just to her but also people around her. she has been encouraged to call the office with any questions or concerns that should arise related to todays visit.  No orders of the defined types were placed in this encounter.       I have personally obtained a history, evaluated the patient, evaluated pertinent data, formulated the assessment and plan and placed orders.   This patient was seen today by Emmaline Kluver, PA-C in collaboration with Dr. Freda Munro.   Yevonne Pax, MD [facility] Diplomate ABMS Pulmonary and Critical Care Medicine Sleep medicine

## 2023-05-14 ENCOUNTER — Other Ambulatory Visit: Payer: Self-pay

## 2023-05-14 ENCOUNTER — Ambulatory Visit (INDEPENDENT_AMBULATORY_CARE_PROVIDER_SITE_OTHER): Payer: Medicaid Other | Admitting: Internal Medicine

## 2023-05-14 VITALS — BP 150/98 | HR 75 | Resp 18 | Ht 67.0 in | Wt 279.0 lb

## 2023-05-14 DIAGNOSIS — I5033 Acute on chronic diastolic (congestive) heart failure: Secondary | ICD-10-CM

## 2023-05-14 DIAGNOSIS — G4700 Insomnia, unspecified: Secondary | ICD-10-CM | POA: Diagnosis not present

## 2023-05-14 DIAGNOSIS — R0681 Apnea, not elsewhere classified: Secondary | ICD-10-CM

## 2023-05-14 DIAGNOSIS — I10 Essential (primary) hypertension: Secondary | ICD-10-CM

## 2023-05-14 MED ORDER — METFORMIN HCL ER 750 MG PO TB24
750.0000 mg | ORAL_TABLET | Freq: Every day | ORAL | 1 refills | Status: DC
  Filled 2023-05-14: qty 90, 90d supply, fill #0
  Filled 2023-08-01: qty 90, 90d supply, fill #1

## 2023-05-14 MED ORDER — RANOLAZINE ER 500 MG PO TB12
500.0000 mg | ORAL_TABLET | Freq: Two times a day (BID) | ORAL | 0 refills | Status: DC
  Filled 2023-05-14: qty 180, 90d supply, fill #0

## 2023-05-14 MED ORDER — CLOPIDOGREL BISULFATE 75 MG PO TABS
75.0000 mg | ORAL_TABLET | Freq: Every day | ORAL | 0 refills | Status: DC
  Filled 2023-05-14: qty 90, 90d supply, fill #0

## 2023-05-14 NOTE — Telephone Encounter (Signed)
Last office visit:  02/15/23  with plan to f/u in 6 months  Next office visit:  none/active recall

## 2023-05-24 ENCOUNTER — Other Ambulatory Visit: Payer: Self-pay

## 2023-05-25 ENCOUNTER — Other Ambulatory Visit: Payer: Self-pay

## 2023-05-28 ENCOUNTER — Other Ambulatory Visit: Payer: Medicaid Other

## 2023-05-28 DIAGNOSIS — Z0000 Encounter for general adult medical examination without abnormal findings: Secondary | ICD-10-CM

## 2023-05-28 DIAGNOSIS — E119 Type 2 diabetes mellitus without complications: Secondary | ICD-10-CM

## 2023-05-29 ENCOUNTER — Ambulatory Visit: Payer: Medicaid Other | Admitting: Internal Medicine

## 2023-05-29 VITALS — BP 152/84 | HR 88 | Ht 67.0 in | Wt 264.0 lb

## 2023-05-29 DIAGNOSIS — E782 Mixed hyperlipidemia: Secondary | ICD-10-CM

## 2023-05-29 DIAGNOSIS — I10 Essential (primary) hypertension: Secondary | ICD-10-CM

## 2023-05-29 DIAGNOSIS — H9193 Unspecified hearing loss, bilateral: Secondary | ICD-10-CM

## 2023-05-29 DIAGNOSIS — Z1231 Encounter for screening mammogram for malignant neoplasm of breast: Secondary | ICD-10-CM | POA: Diagnosis not present

## 2023-05-29 DIAGNOSIS — E119 Type 2 diabetes mellitus without complications: Secondary | ICD-10-CM | POA: Diagnosis not present

## 2023-05-29 LAB — LIPID PANEL
Chol/HDL Ratio: 6.4 {ratio} — ABNORMAL HIGH (ref 0.0–4.4)
Cholesterol, Total: 148 mg/dL (ref 100–199)
HDL: 23 mg/dL — ABNORMAL LOW (ref >39)
LDL Chol Calc (NIH): 82 mg/dL (ref 0–99)
Triglycerides: 255 mg/dL — ABNORMAL HIGH (ref 0–149)
VLDL Cholesterol Cal: 43 mg/dL — ABNORMAL HIGH (ref 5–40)

## 2023-05-29 LAB — COMPREHENSIVE METABOLIC PANEL
ALT: 12 [IU]/L (ref 0–32)
AST: 17 [IU]/L (ref 0–40)
Albumin: 4.2 g/dL (ref 3.8–4.9)
Alkaline Phosphatase: 76 [IU]/L (ref 44–121)
BUN/Creatinine Ratio: 9 (ref 9–23)
BUN: 9 mg/dL (ref 6–24)
Bilirubin Total: 0.4 mg/dL (ref 0.0–1.2)
CO2: 23 mmol/L (ref 20–29)
Calcium: 9.1 mg/dL (ref 8.7–10.2)
Chloride: 102 mmol/L (ref 96–106)
Creatinine, Ser: 1.04 mg/dL — ABNORMAL HIGH (ref 0.57–1.00)
Globulin, Total: 2.8 g/dL (ref 1.5–4.5)
Glucose: 111 mg/dL — ABNORMAL HIGH (ref 70–99)
Potassium: 3.8 mmol/L (ref 3.5–5.2)
Sodium: 141 mmol/L (ref 134–144)
Total Protein: 7 g/dL (ref 6.0–8.5)
eGFR: 63 mL/min/{1.73_m2} (ref >59)

## 2023-05-29 LAB — HEMOGLOBIN A1C
Est. average glucose Bld gHb Est-mCnc: 114 mg/dL
Hgb A1c MFr Bld: 5.6 % (ref 4.8–5.6)

## 2023-05-29 LAB — POCT CBG (FASTING - GLUCOSE)-MANUAL ENTRY: Glucose Fasting, POC: 120 mg/dL — AB (ref 70–99)

## 2023-05-29 NOTE — Progress Notes (Unsigned)
Established Patient Office Visit  Subjective:  Patient ID: Kathy [name], female    DOB: [birth year]  Age: 58 y.o. MRN: [id number]  Chief Complaint  Patient presents with   Follow-up    3 Months Follow Up & Sick    No new complaints, here for lab review and medication refills. Labs reviewed and notable for well controlled diabetes, A1c at target, lipids at target except elevated trigs with unremarkable cmp. Denies any hypoglycemic episodes and home bg readings have been at target.     No other concerns at this time.   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 02/2016 Echo: EF 45%; b. 03/2016 Echo: EF 55%; c. 12/2019 Echo: EF 55-60%, no rwma, gr2 DD, nl RV size/fxn, mildly dil RA, mild MR.   Asthma    Chronic chest pain    a. ? vasospasm vs endothelial dysfxn. Improved w/ Ranexa but couldn't afford.   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 Presumed in setting of prior h/o MI w/ insignificant coronary dzs.   Diabetes mellitus without complication (HCC)    Heart murmur    History of NICM (nonischemic cardiomyopathy) (HCC)    a. 02/2016 Echo: EF 45%; b. 12/2019 Echo: EF 55-60%.   Hyperlipidemia    Hypertension    Myocardial infarction [facility])    Non-obstructive Coronary artery disease    a. Prior caths in 2007, 2011, 2012 - nonobs dzs; b. 03/2016 Cath ([facility]): LM nl, LAD/LCX/RCA insignificant dzs.   Obesity    Rate-dependent LBBB (left bundle branch block)    Remote Tobacco abuse    a. 30+ pack year history - quit 2019.   Seizures (HCC)     Past Surgical History:  Procedure Laterality Date   ANTERIOR CRUCIATE LIGAMENT REPAIR     BACK SURGERY     L4 and L5   CARDIAC CATHETERIZATION  04/09/2016   COLONOSCOPY     COLONOSCOPY WITH PROPOFOL N/A 08/04/2019   Procedure: COLONOSCOPY WITH PROPOFOL;  Surgeon: Toney Reil, MD;  Location: [facility] ENDOSCOPY;  Service: Gastroenterology;  Laterality: N/A;   EMPYEMA  DRAINAGE     at age 66   RIGHT/LEFT HEART CATH AND CORONARY ANGIOGRAPHY Bilateral 07/04/2022   Procedure: RIGHT/LEFT HEART CATH AND CORONARY ANGIOGRAPHY;  Surgeon: Iran Ouch, MD;  Location: [facility] INVASIVE CV LAB;  Service: Cardiovascular;  Laterality: Bilateral;   TONSILLECTOMY     TONSILLECTOMY AND ADENOIDECTOMY  2008   TUBAL LIGATION      Social History   Socioeconomic History   Marital status: Divorced    Spouse name: Not on file   Number of children: 2   Years of education: pre-requisites    Highest education level: GED or equivalent  Occupational History   Occupation: unemployed  Tobacco Use   Smoking status: Former    Current packs/day: 0.00    Average packs/day: 1 pack/day for 30.0 years (30.0 ttl pk-yrs)    Types: Cigarettes    Start date: 08/17/1987    Quit date: 08/16/2017    Years since quitting: 5.7   Smokeless tobacco: Never  Vaping Use   Vaping status: Never Used  Substance and Sexual Activity   Alcohol use: Not Currently    Comment: last use 01/2022   Drug use: Not Currently    Comment: Previously used cocaine in the 1990s   Sexual activity: Yes    Birth control/protection: Condom  Other Topics  Concern   Not on file  Social History Narrative   Currently on food stamps. They help somewhat but still difficult to buy food that she needs. Boyfriend helps pay rent with disability, they live together. Roof is leaking really badly. Lives in modular home. Social services bought tarp which helped, but it deteriorated from heat.    Social Drivers of Corporate investment banker Strain: High Risk (02/12/2018)   Overall Financial Resource Strain (CARDIA)    Difficulty of Paying Living Expenses: Hard  Food Insecurity: No Food Insecurity (10/20/2021)   Hunger Vital Sign    Worried About Running Out of Food in the Last Year: Never true    Ran Out of Food in the Last Year: Never true  Transportation Needs: No Transportation Needs (10/20/2021)   PRAPARE -  Administrator, Civil Service (Medical): No    Lack of Transportation (Non-Medical): No  Physical Activity: Sufficiently Active (11/06/2017)   Exercise Vital Sign    Days of Exercise per Week: 2 days    Minutes of Exercise per Session: 100 min  Stress: Stress Concern Present (11/06/2017)   [employer] of Occupational Health - Occupational Stress Questionnaire    Feeling of Stress : To some extent  Social Connections: Moderately Isolated (11/06/2017)   Social Connection and Isolation Panel [NHANES]    Frequency of Communication with Friends and Family: More than three times a week    Frequency of Social Gatherings with Friends and Family: Once a week    Attends Religious Services: Never    Database administrator or Organizations: No    Attends Banker Meetings: Never    Marital Status: Divorced  Catering manager Violence: Not At Risk (11/06/2017)   Humiliation, Afraid, Rape, and Kick questionnaire    Fear of Current or Ex-Partner: No    Emotionally Abused: No    Physically Abused: No    Sexually Abused: No    Family History  Problem Relation Age of Onset   Kidney disease Sister    Diabetes Sister    COPD Sister    Stroke Sister    Thyroid disease Sister    Diabetes Mother    Stroke Mother    Heart attack Mother    Thyroid disease Mother    Colon cancer Mother    Stroke Father    Heart attack Father    Asthma Daughter    Hypertension Daughter    Thyroid disease Daughter    Asthma Son    Heart disease Maternal Grandmother    Hypertension Maternal Grandmother    Colon cancer Maternal Grandfather    Hypertension Maternal Grandfather    Hyperlipidemia Maternal Grandfather    Stroke Paternal Grandmother    Hypertension Paternal Grandfather    Hyperlipidemia Paternal Grandfather    Heart disease Paternal Grandfather    Thyroid disease Sister    Migraines Sister     Allergies  Allergen Reactions   Aspirin Anaphylaxis, Hives and Swelling     Occurred as a child Occurred as a child   Beef Allergy Hives   Celebrex [Celecoxib] Anaphylaxis and Hives   Fentanyl Anaphylaxis   Fluticasone Propionate Other (See Comments) and Swelling    Swelling, pain, and itching   Gabapentin Shortness Of Breath   Hydrocodone-Acetaminophen Hives, Other (See Comments) and Swelling    Feels like her tongue swells. Trouble swallowing   Latex Hives   Lisinopril Shortness Of Breath   Morphine Anaphylaxis and Swelling  Oxycodone-Acetaminophen Hives and Shortness Of Breath   Percocet [Oxycodone-Acetaminophen] Hives and Shortness Of Breath   Pork Allergy Hives   Simvastatin Hives and Shortness Of Breath    Can tolerate 20 mg dose.   Skelaxin [Metaxalone] Anaphylaxis   Beef-Derived Drug Products Hives   Pork-Derived Products Hives   Vicodin [Hydrocodone-Acetaminophen] Hives and Other (See Comments)    Feels like her tongue swells. Trouble swallowing   Albuterol Palpitations   Jardiance [Empagliflozin] Nausea Only and Palpitations    Outpatient Medications Prior to Visit  Medication Sig   Accu-Chek Softclix Lancets lancets Use as instructed   Accu-Chek Softclix Lancets lancets Use as instructed   blood glucose meter kit and supplies KIT Dispense based on patient and insurance preference. Use up to four times daily as directed. (FOR ICD-9 250.00, 250.01).   Blood Pressure Monitoring (BLOOD PRESSURE KIT) KIT 1 kit by Does not apply route daily as needed.   clopidogrel (PLAVIX) 75 MG tablet Take 1 tablet (75 mg total) by mouth once daily.   diphenhydrAMINE (BENADRYL) 50 MG tablet Take 50 mg by mouth 2 (two) times daily.   EPINEPHrine 0.3 mg/0.3 mL IJ SOAJ injection Inject 0.3 into the muscle as needed for anaphylaxis   glucose blood (ACCU-CHEK GUIDE TEST) test strip Use as instructed   Insulin Pen Needle 32G X 6 MM MISC Use one pen needle with Ozempic once every week   isosorbide mononitrate (IMDUR) 60 MG 24 hr tablet Take 1 tablet (60 mg total) by  mouth daily.   metFORMIN (GLUCOPHAGE-XR) 750 MG 24 hr tablet Take 1 tablet (750 mg total) by mouth daily with breakfast.   metoprolol tartrate (LOPRESSOR) 25 MG tablet Take 1 tablet (25 mg total) by mouth 2 (two) times daily.   nitroGLYCERIN (NITROSTAT) 0.4 MG SL tablet 1 TABLET UNDER TONGUE AS NEEDED FOR CHEST PAIN EVERY 5 MINUTES FOR MAX OF 3 DOSES IN 15 MINUTES; IF NO RELIEF AFTER 1ST DOSE CALL 911   pantoprazole (PROTONIX) 40 MG tablet Take 1 tablet (40 mg total) by mouth once daily.   potassium chloride SA (KLOR-CON M) 20 MEQ tablet Take 1 tablet (20 mEq total) by mouth 2 (two) times daily.   ranolazine (RANEXA) 500 MG 12 hr tablet Take 1 tablet (500 mg total) by mouth 2 (two) times daily.   sacubitril-valsartan (ENTRESTO) 24-26 MG Take 1 tablet by mouth 2 (two) times daily.   Semaglutide, 1 MG/DOSE, (OZEMPIC, 1 MG/DOSE,) 4 MG/3ML SOPN Inject 1 mg into the skin once a week. Instead of Trulicity   simvastatin (ZOCOR) 20 MG tablet Take 1 tablet (20 mg total) by mouth once daily.   spironolactone (ALDACTONE) 25 MG tablet Take 1 tablet (25 mg total) by mouth daily.   torsemide (DEMADEX) 20 MG tablet Take 3 tablets (60 mg total) by mouth daily.   No facility-administered medications prior to visit.    Review of Systems  Constitutional:  Positive for weight loss.  HENT:  Positive for hearing loss.   Eyes: Negative.   Respiratory: Negative.    Cardiovascular: Negative.   Gastrointestinal: Negative.   Genitourinary: Negative.   Musculoskeletal:  Positive for joint pain.  Skin: Negative.   Neurological: Negative.   Endo/Heme/Allergies: Negative.        Objective:   BP (!) 152/84   Pulse 88   Ht 5\' 7"  (1.702 m)   Wt 264 lb (119.7 kg)   LMP 06/09/2018   SpO2 95%   BMI 41.35 kg/m   Vitals:  05/29/23 1503  BP: (!) 152/84  Pulse: 88  Height: 5\' 7"  (1.702 m)  Weight: 264 lb (119.7 kg)  SpO2: 95%  BMI (Calculated): 41.34    Physical Exam Vitals reviewed.  Constitutional:       General: She is not in acute distress.    Appearance: She is obese.  HENT:     Head: Normocephalic.     Right Ear: Decreased hearing noted.     Left Ear: Decreased hearing noted.     Nose: Nose normal.     Mouth/Throat:     Mouth: Mucous membranes are moist.  Eyes:     Extraocular Movements: Extraocular movements intact.     Pupils: Pupils are equal, round, and reactive to light.  Cardiovascular:     Rate and Rhythm: Normal rate and regular rhythm.     Heart sounds: No murmur heard. Pulmonary:     Effort: Pulmonary effort is normal.     Breath sounds: No rhonchi or rales.  Abdominal:     General: Abdomen is flat.     Palpations: There is no hepatomegaly, splenomegaly or mass.  Musculoskeletal:     Right shoulder: Decreased range of motion.     Left shoulder: Decreased range of motion.     Cervical back: Normal range of motion. No tenderness.  Skin:    General: Skin is warm and dry.  Neurological:     General: No focal deficit present.     Mental Status: She is alert and oriented to person, place, and time.     Cranial Nerves: No cranial nerve deficit.     Motor: No weakness.  Psychiatric:        Mood and Affect: Mood normal.        Behavior: Behavior normal.      Results for orders placed or performed in visit on 05/29/23  POCT CBG (Fasting - Glucose)  Result Value Ref Range   Glucose Fasting, POC 120 (A) 70 - 99 mg/dL    Recent Results (from the past 2160 hours)  Lipid panel     Status: Abnormal   Collection Time: 05/28/23 10:19 AM  Result Value Ref Range   Cholesterol, Total 148 100 - 199 mg/dL   Triglycerides 540 (H) 0 - 149 mg/dL   HDL 23 (L) >98 mg/dL   VLDL Cholesterol Cal 43 (H) 5 - 40 mg/dL   LDL Chol Calc (NIH) 82 0 - 99 mg/dL   Chol/HDL Ratio 6.4 (H) 0.0 - 4.4 ratio    Comment:                                   T. Chol/HDL Ratio                                             Men  Women                               1/2 Avg.Risk  3.4    3.3                                    Avg.Risk  5.0    4.4  2X Avg.Risk  9.6    7.1                                3X Avg.Risk 23.4   11.0   Hemoglobin A1c     Status: None   Collection Time: 05/28/23 10:19 AM  Result Value Ref Range   Hgb A1c MFr Bld 5.6 4.8 - 5.6 %    Comment:          Prediabetes: 5.7 - 6.4          Diabetes: >6.4          Glycemic control for adults with diabetes: <7.0    Est. average glucose Bld gHb Est-mCnc 114 mg/dL  Comprehensive metabolic panel     Status: Abnormal   Collection Time: 05/28/23 10:19 AM  Result Value Ref Range   Glucose 111 (H) 70 - 99 mg/dL   BUN 9 6 - 24 mg/dL   Creatinine, Ser 1.61 (H) 0.57 - 1.00 mg/dL   eGFR 63 >09 UE/AVW/0.98   BUN/Creatinine Ratio 9 9 - 23   Sodium 141 134 - 144 mmol/L   Potassium 3.8 3.5 - 5.2 mmol/L   Chloride 102 96 - 106 mmol/L   CO2 23 20 - 29 mmol/L   Calcium 9.1 8.7 - 10.2 mg/dL   Total Protein 7.0 6.0 - 8.5 g/dL   Albumin 4.2 3.8 - 4.9 g/dL   Globulin, Total 2.8 1.5 - 4.5 g/dL   Bilirubin Total 0.4 0.0 - 1.2 mg/dL   Alkaline Phosphatase 76 44 - 121 IU/L   AST 17 0 - 40 IU/L   ALT 12 0 - 32 IU/L  POCT CBG (Fasting - Glucose)     Status: Abnormal   Collection Time: 05/29/23  3:08 PM  Result Value Ref Range   Glucose Fasting, POC 120 (A) 70 - 99 mg/dL      Assessment & Plan:  As per problem list.  Problem List Items Addressed This Visit       Cardiovascular and Mediastinum   Hypertension     Endocrine   Type 2 diabetes mellitus without complications (HCC) - Primary   Relevant Orders   POCT CBG (Fasting - Glucose) (Completed)     Nervous and Auditory   Hearing loss     Other   Hyperlipidemia   Other Visit Diagnoses       Encounter for screening mammogram for malignant neoplasm of breast       Relevant Orders   MM 3D SCREENING MAMMOGRAM BILATERAL BREAST       Return in about 3 months (around 08/26/2023) for fu with labs prior.   Total time spent: 20  minutes  Luna Fuse, MD  05/29/2023   This document may have been prepared by [facility] Voice Recognition software and as such may include unintentional dictation errors.

## 2023-05-31 ENCOUNTER — Other Ambulatory Visit: Payer: Self-pay

## 2023-06-04 ENCOUNTER — Ambulatory Visit: Admission: RE | Admit: 2023-06-04 | Payer: Medicaid Other | Source: Ambulatory Visit

## 2023-06-05 ENCOUNTER — Other Ambulatory Visit: Payer: Self-pay

## 2023-06-13 ENCOUNTER — Encounter: Payer: Self-pay | Admitting: Internal Medicine

## 2023-06-21 ENCOUNTER — Other Ambulatory Visit: Payer: Self-pay

## 2023-06-21 ENCOUNTER — Other Ambulatory Visit: Payer: Self-pay | Admitting: Cardiovascular Disease

## 2023-06-21 MED ORDER — METOPROLOL TARTRATE 25 MG PO TABS
25.0000 mg | ORAL_TABLET | Freq: Two times a day (BID) | ORAL | 3 refills | Status: AC
  Filled 2023-06-21: qty 180, 90d supply, fill #0
  Filled 2023-09-18: qty 180, 90d supply, fill #1
  Filled 2023-12-14: qty 180, 90d supply, fill #2
  Filled 2024-03-10: qty 180, 90d supply, fill #3

## 2023-06-25 ENCOUNTER — Other Ambulatory Visit: Payer: Self-pay

## 2023-07-06 ENCOUNTER — Ambulatory Visit
Admission: RE | Admit: 2023-07-06 | Discharge: 2023-07-06 | Disposition: A | Discharge status: Home / Self Care | Payer: Medicaid Other | Source: Ambulatory Visit | Attending: Family | Admitting: Family

## 2023-07-06 DIAGNOSIS — I110 Hypertensive heart disease with heart failure: Secondary | ICD-10-CM | POA: Insufficient documentation

## 2023-07-06 DIAGNOSIS — I5022 Chronic systolic (congestive) heart failure: Secondary | ICD-10-CM | POA: Diagnosis present

## 2023-07-06 DIAGNOSIS — R011 Cardiac murmur, unspecified: Secondary | ICD-10-CM | POA: Diagnosis not present

## 2023-07-06 LAB — ECHOCARDIOGRAM COMPLETE
AR max vel: 2.65 cm2
AV Area VTI: 2.53 cm2
AV Area mean vel: 2.46 cm2
AV Mean grad: 5.5 mmHg
AV Peak grad: 9.1 mmHg
Ao pk vel: 1.51 m/s
Area-P 1/2: 3.07 cm2
MV VTI: 2.75 cm2
S' Lateral: 3.4 cm

## 2023-07-06 NOTE — Progress Notes (Signed)
*  PRELIMINARY RESULTS* Echocardiogram 2D Echocardiogram has been performed.  Kathy [name] 07/06/2023, 11:18 AM

## 2023-07-18 ENCOUNTER — Other Ambulatory Visit: Payer: Self-pay

## 2023-07-19 ENCOUNTER — Other Ambulatory Visit: Payer: Self-pay

## 2023-07-20 ENCOUNTER — Other Ambulatory Visit: Payer: Self-pay

## 2023-07-20 MED ORDER — DIAZEPAM 10 MG PO TABS
10.0000 mg | ORAL_TABLET | Freq: Every day | ORAL | 0 refills | Status: DC
  Filled 2023-07-20 – 2023-07-23 (×2): qty 3, 3d supply, fill #0

## 2023-07-20 MED ORDER — DIAZEPAM 10 MG PO TABS
10.0000 mg | ORAL_TABLET | ORAL | 0 refills | Status: DC
  Filled 2023-07-20: qty 3, 3d supply, fill #0

## 2023-07-23 ENCOUNTER — Other Ambulatory Visit: Payer: Self-pay

## 2023-08-01 ENCOUNTER — Other Ambulatory Visit: Payer: Self-pay

## 2023-08-01 ENCOUNTER — Other Ambulatory Visit: Payer: Self-pay | Admitting: Cardiovascular Disease

## 2023-08-01 ENCOUNTER — Other Ambulatory Visit: Payer: Self-pay | Admitting: Family

## 2023-08-01 ENCOUNTER — Other Ambulatory Visit: Payer: Self-pay | Admitting: Internal Medicine

## 2023-08-01 DIAGNOSIS — E782 Mixed hyperlipidemia: Secondary | ICD-10-CM

## 2023-08-01 DIAGNOSIS — I10 Essential (primary) hypertension: Secondary | ICD-10-CM

## 2023-08-01 MED ORDER — RANOLAZINE ER 500 MG PO TB12
500.0000 mg | ORAL_TABLET | Freq: Two times a day (BID) | ORAL | 2 refills | Status: DC
  Filled 2023-08-01: qty 180, 90d supply, fill #0

## 2023-08-01 MED ORDER — SIMVASTATIN 20 MG PO TABS
20.0000 mg | ORAL_TABLET | Freq: Every day | ORAL | 1 refills | Status: DC
  Filled 2023-08-01: qty 90, 90d supply, fill #0
  Filled 2023-11-03: qty 90, 90d supply, fill #1

## 2023-08-01 MED ORDER — CLOPIDOGREL BISULFATE 75 MG PO TABS
75.0000 mg | ORAL_TABLET | Freq: Every day | ORAL | 2 refills | Status: DC
  Filled 2023-08-01: qty 90, 90d supply, fill #0
  Filled 2023-11-03: qty 90, 90d supply, fill #1
  Filled 2024-02-04: qty 90, 90d supply, fill #2

## 2023-08-02 ENCOUNTER — Other Ambulatory Visit: Payer: Self-pay

## 2023-08-06 ENCOUNTER — Other Ambulatory Visit: Payer: Self-pay | Admitting: Family

## 2023-08-06 ENCOUNTER — Other Ambulatory Visit (HOSPITAL_COMMUNITY): Payer: Self-pay

## 2023-08-06 ENCOUNTER — Other Ambulatory Visit: Payer: Self-pay

## 2023-08-06 ENCOUNTER — Telehealth: Payer: Self-pay | Admitting: Pharmacy Technician

## 2023-08-06 DIAGNOSIS — I10 Essential (primary) hypertension: Secondary | ICD-10-CM

## 2023-08-06 NOTE — Telephone Encounter (Signed)
 Received fax from [employer] for renewal- patient has medicaid now  Pharmacy Patient Advocate Encounter   Received notification from Fax that prior authorization for Entresto  is required/requested.   Insurance verification completed.   The patient is insured through [employer] .   Per test claim: The current 08/06/23 day co-pay is, $4.00- 3 months.  No PA needed at this time. This test claim was processed through [facility]- copay amounts may vary at other pharmacies due to pharmacy/plan contracts, or as the patient moves through the different stages of their insurance plan.

## 2023-08-07 ENCOUNTER — Other Ambulatory Visit: Payer: Self-pay

## 2023-08-08 ENCOUNTER — Other Ambulatory Visit: Payer: Self-pay

## 2023-08-08 MED FILL — Potassium Chloride Microencapsulated Crys ER Tab 20 mEq: ORAL | 90 days supply | Qty: 180 | Fill #0 | Status: AC

## 2023-08-09 ENCOUNTER — Other Ambulatory Visit: Payer: Self-pay

## 2023-08-23 ENCOUNTER — Other Ambulatory Visit: Payer: Self-pay

## 2023-08-30 ENCOUNTER — Other Ambulatory Visit

## 2023-08-30 ENCOUNTER — Other Ambulatory Visit: Payer: Self-pay | Admitting: Internal Medicine

## 2023-08-31 ENCOUNTER — Ambulatory Visit: Payer: Medicaid Other | Admitting: Internal Medicine

## 2023-08-31 ENCOUNTER — Other Ambulatory Visit: Payer: Self-pay

## 2023-08-31 ENCOUNTER — Ambulatory Visit: Payer: Self-pay | Admitting: Internal Medicine

## 2023-08-31 VITALS — BP 120/80 | HR 74 | Ht 67.0 in | Wt 269.8 lb

## 2023-08-31 DIAGNOSIS — E782 Mixed hyperlipidemia: Secondary | ICD-10-CM

## 2023-08-31 DIAGNOSIS — I10 Essential (primary) hypertension: Secondary | ICD-10-CM | POA: Diagnosis not present

## 2023-08-31 DIAGNOSIS — E119 Type 2 diabetes mellitus without complications: Secondary | ICD-10-CM | POA: Diagnosis not present

## 2023-08-31 DIAGNOSIS — E66813 Obesity, class 3: Secondary | ICD-10-CM | POA: Diagnosis not present

## 2023-08-31 DIAGNOSIS — Z6841 Body Mass Index (BMI) 40.0 and over, adult: Secondary | ICD-10-CM

## 2023-08-31 DIAGNOSIS — L500 Allergic urticaria: Secondary | ICD-10-CM

## 2023-08-31 LAB — LIPID PANEL
Chol/HDL Ratio: 4.2 ratio (ref 0.0–4.4)
Cholesterol, Total: 147 mg/dL (ref 100–199)
HDL: 35 mg/dL — ABNORMAL LOW (ref >39)
LDL Chol Calc (NIH): 90 mg/dL (ref 0–99)
Triglycerides: 124 mg/dL (ref 0–149)
VLDL Cholesterol Cal: 22 mg/dL (ref 5–40)

## 2023-08-31 LAB — COMPREHENSIVE METABOLIC PANEL WITH GFR
ALT: 10 IU/L (ref 0–32)
AST: 9 IU/L (ref 0–40)
Albumin: 4 g/dL (ref 3.8–4.9)
Alkaline Phosphatase: 86 IU/L (ref 44–121)
BUN/Creatinine Ratio: 17 (ref 9–23)
BUN: 18 mg/dL (ref 6–24)
Bilirubin Total: 0.2 mg/dL (ref 0.0–1.2)
CO2: 24 mmol/L (ref 20–29)
Calcium: 9.1 mg/dL (ref 8.7–10.2)
Chloride: 104 mmol/L (ref 96–106)
Creatinine, Ser: 1.06 mg/dL — ABNORMAL HIGH (ref 0.57–1.00)
Globulin, Total: 2.3 g/dL (ref 1.5–4.5)
Glucose: 95 mg/dL (ref 70–99)
Potassium: 4.3 mmol/L (ref 3.5–5.2)
Sodium: 141 mmol/L (ref 134–144)
Total Protein: 6.3 g/dL (ref 6.0–8.5)
eGFR: 61 mL/min/{1.73_m2} (ref >59)

## 2023-08-31 LAB — HEMOGLOBIN A1C
Est. average glucose Bld gHb Est-mCnc: 105 mg/dL
Hgb A1c MFr Bld: 5.3 % (ref 4.8–5.6)

## 2023-08-31 LAB — POC CREATINE & ALBUMIN,URINE
Albumin/Creatinine Ratio, Urine, POC: <30
Creatinine, POC: 10 mg/dL
Microalbumin Ur, POC: 50 mg/L

## 2023-08-31 LAB — GLUCOSE, POCT (MANUAL RESULT ENTRY): POC Glucose: 102 mg/dL — AB (ref 70–99)

## 2023-08-31 MED ORDER — OZEMPIC (2 MG/DOSE) 8 MG/3ML ~~LOC~~ SOPN
2.0000 mg | PEN_INJECTOR | SUBCUTANEOUS | 2 refills | Status: DC
  Filled 2023-08-31: qty 3, 28d supply, fill #0

## 2023-08-31 MED ORDER — PREDNISONE 20 MG PO TABS
40.0000 mg | ORAL_TABLET | Freq: Every day | ORAL | 0 refills | Status: AC
  Filled 2023-08-31: qty 10, 5d supply, fill #0

## 2023-08-31 MED ORDER — OZEMPIC (2 MG/DOSE) 8 MG/3ML ~~LOC~~ SOPN
2.0000 mg | PEN_INJECTOR | SUBCUTANEOUS | 0 refills | Status: DC
  Filled 2023-08-31: qty 9, 28d supply, fill #0
  Filled 2023-09-05: qty 3, 28d supply, fill #0
  Filled 2023-09-28: qty 3, 28d supply, fill #1
  Filled 2023-10-27: qty 3, 28d supply, fill #2

## 2023-08-31 MED ORDER — METHYLPREDNISOLONE ACETATE 40 MG/ML IJ SUSP
80.0000 mg | Freq: Once | INTRAMUSCULAR | Status: AC
Start: 2023-08-31 — End: 2023-08-31
  Administered 2023-08-31: 80 mg via INTRAMUSCULAR

## 2023-08-31 MED ORDER — METFORMIN HCL ER 500 MG PO TB24
500.0000 mg | ORAL_TABLET | Freq: Every day | ORAL | 0 refills | Status: DC
  Filled 2023-08-31: qty 90, 90d supply, fill #0

## 2023-08-31 NOTE — Progress Notes (Signed)
 Established Patient Office Visit  Subjective:  Patient ID: Kathy [name], female    DOB: [birth year]  Age: 58 y.o. MRN: [id number]  Chief Complaint  Patient presents with   Follow-up    3 month follow up    C/o rash after exposure to pork, here for lab review and medication refills. Labs reviewed and notable for well controlled diabetes, A1c at target, lipids at target with unremarkable cmp. Denies any hypoglycemic episodes and home bg readings have been at target.     No other concerns at this time.   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 02/2016 Echo: EF 45%; b. 03/2016 Echo: EF 55%; c. 12/2019 Echo: EF 55-60%, no rwma, gr2 DD, nl RV size/fxn, mildly dil RA, mild MR.   Asthma    Chronic chest pain    a. ? vasospasm vs endothelial dysfxn. Improved w/ Ranexa  but couldn't afford.   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 Presumed in setting of prior h/o MI w/ insignificant coronary dzs.   Diabetes mellitus without complication (HCC)    Heart murmur    History of NICM (nonischemic cardiomyopathy) (HCC)    a. 02/2016 Echo: EF 45%; b. 12/2019 Echo: EF 55-60%.   Hyperlipidemia    Hypertension    Myocardial infarction [facility])    Non-obstructive Coronary artery disease    a. Prior caths in 2007, 2011, 2012 - nonobs dzs; b. 03/2016 Cath ([facility]): LM nl, LAD/LCX/RCA insignificant dzs.   Obesity    Rate-dependent LBBB (left bundle branch block)    Remote Tobacco abuse    a. 30+ pack year history - quit 2019.   Seizures (HCC)     Past Surgical History:  Procedure Laterality Date   ANTERIOR CRUCIATE LIGAMENT REPAIR     BACK SURGERY     L4 and L5   CARDIAC CATHETERIZATION  04/09/2016   COLONOSCOPY     COLONOSCOPY WITH PROPOFOL  N/A 08/04/2019   Procedure: COLONOSCOPY WITH PROPOFOL ;  Surgeon: Selena Daily, MD;  Location: [facility] ENDOSCOPY;  Service: Gastroenterology;  Laterality: N/A;   EMPYEMA DRAINAGE     at age  40   RIGHT/LEFT HEART CATH AND CORONARY ANGIOGRAPHY Bilateral 07/04/2022   Procedure: RIGHT/LEFT HEART CATH AND CORONARY ANGIOGRAPHY;  Surgeon: Wenona Hamilton, MD;  Location: [facility] INVASIVE CV LAB;  Service: Cardiovascular;  Laterality: Bilateral;   TONSILLECTOMY     TONSILLECTOMY AND ADENOIDECTOMY  2008   TUBAL LIGATION      Social History   Socioeconomic History   Marital status: Divorced    Spouse name: Not on file   Number of children: 2   Years of education: pre-requisites    Highest education level: GED or equivalent  Occupational History   Occupation: unemployed  Tobacco Use   Smoking status: Former    Current packs/day: 0.00    Average packs/day: 1 pack/day for 30.0 years (30.0 ttl pk-yrs)    Types: Cigarettes    Start date: 08/17/1987    Quit date: 08/16/2017    Years since quitting: 6.0   Smokeless tobacco: Never  Vaping Use   Vaping status: Never Used  Substance and Sexual Activity   Alcohol use: Not Currently    Comment: last use 01/2022   Drug use: Not Currently    Comment: Previously used cocaine in the 1990s   Sexual activity: Yes    Birth control/protection: Condom  Other Topics Concern  Not on file  Social History Narrative   Currently on food stamps. They help somewhat but still difficult to buy food that she needs. Boyfriend helps pay rent with disability, they live together. Roof is leaking really badly. Lives in modular home. Social services bought tarp which helped, but it deteriorated from heat.    Social Drivers of Corporate investment banker Strain: High Risk (02/12/2018)   Overall Financial Resource Strain (CARDIA)    Difficulty of Paying Living Expenses: Hard  Food Insecurity: No Food Insecurity (10/20/2021)   Hunger Vital Sign    Worried About Running Out of Food in the Last Year: Never true    Ran Out of Food in the Last Year: Never true  Transportation Needs: No Transportation Needs (10/20/2021)   PRAPARE - Scientist, research (physical sciences) (Medical): No    Lack of Transportation (Non-Medical): No  Physical Activity: Sufficiently Active (11/06/2017)   Exercise Vital Sign    Days of Exercise per Week: 2 days    Minutes of Exercise per Session: 100 min  Stress: Stress Concern Present (11/06/2017)   [employer] of Occupational Health - Occupational Stress Questionnaire    Feeling of Stress : To some extent  Social Connections: Moderately Isolated (11/06/2017)   Social Connection and Isolation Panel [NHANES]    Frequency of Communication with Friends and Family: More than three times a week    Frequency of Social Gatherings with Friends and Family: Once a week    Attends Religious Services: Never    Database administrator or Organizations: No    Attends Banker Meetings: Never    Marital Status: Divorced  Catering manager Violence: Not At Risk (11/06/2017)   Humiliation, Afraid, Rape, and Kick questionnaire    Fear of Current or Ex-Partner: No    Emotionally Abused: No    Physically Abused: No    Sexually Abused: No    Family History  Problem Relation Age of Onset   Kidney disease Sister    Diabetes Sister    COPD Sister    Stroke Sister    Thyroid  disease Sister    Diabetes Mother    Stroke Mother    Heart attack Mother    Thyroid  disease Mother    Colon cancer Mother    Stroke Father    Heart attack Father    Asthma Daughter    Hypertension Daughter    Thyroid  disease Daughter    Asthma Son    Heart disease Maternal Grandmother    Hypertension Maternal Grandmother    Colon cancer Maternal Grandfather    Hypertension Maternal Grandfather    Hyperlipidemia Maternal Grandfather    Stroke Paternal Grandmother    Hypertension Paternal Grandfather    Hyperlipidemia Paternal Grandfather    Heart disease Paternal Grandfather    Thyroid  disease Sister    Migraines Sister     Allergies  Allergen Reactions   Aspirin  Anaphylaxis, Hives and Swelling    Occurred as a  child Occurred as a child   Beef Allergy Hives   Celebrex [Celecoxib] Anaphylaxis and Hives   Fentanyl Anaphylaxis   Fluticasone  Propionate Other (See Comments) and Swelling    Swelling, pain, and itching   Gabapentin Shortness Of Breath   Hydrocodone-Acetaminophen  Hives, Other (See Comments) and Swelling    Feels like her tongue swells. Trouble swallowing   Latex Hives   Lisinopril  Shortness Of Breath   Morphine Anaphylaxis and Swelling   Oxycodone-Acetaminophen  Hives  and Shortness Of Breath   Percocet [Oxycodone-Acetaminophen ] Hives and Shortness Of Breath   Pork Allergy Hives   Simvastatin  Hives and Shortness Of Breath    Can tolerate 20 mg dose.   Skelaxin [Metaxalone] Anaphylaxis   Beef-Derived Drug Products Hives   Pork-Derived Products Hives   Vicodin [Hydrocodone-Acetaminophen ] Hives and Other (See Comments)    Feels like her tongue swells. Trouble swallowing   Albuterol Palpitations   Jardiance  [Empagliflozin ] Nausea Only and Palpitations    Outpatient Medications Prior to Visit  Medication Sig   Accu-Chek Softclix Lancets lancets Use as instructed   Accu-Chek Softclix Lancets lancets Use as instructed   blood glucose meter kit and supplies KIT Dispense based on patient and insurance preference. Use up to four times daily as directed. (FOR ICD-9 250.00, 250.01).   Blood Pressure Monitoring (BLOOD PRESSURE KIT) KIT 1 kit by Does not apply route daily as needed.   clopidogrel  (PLAVIX ) 75 MG tablet Take 1 tablet (75 mg total) by mouth once daily.   diphenhydrAMINE  (BENADRYL ) 50 MG tablet Take 50 mg by mouth 2 (two) times daily.   EPINEPHrine  0.3 mg/0.3 mL IJ SOAJ injection Inject 0.3 into the muscle as needed for anaphylaxis   glucose blood (ACCU-CHEK GUIDE TEST) test strip Use as instructed   Insulin  Pen Needle 32G X 6 MM MISC Use one pen needle with Ozempic  once every week   isosorbide  mononitrate (IMDUR ) 60 MG 24 hr tablet Take 1 tablet (60 mg total) by mouth daily.    metoprolol  tartrate (LOPRESSOR ) 25 MG tablet Take 1 tablet (25 mg total) by mouth 2 (two) times daily.   nitroGLYCERIN  (NITROSTAT ) 0.4 MG SL tablet 1 TABLET UNDER TONGUE AS NEEDED FOR CHEST PAIN EVERY 5 MINUTES FOR MAX OF 3 DOSES IN 15 MINUTES; IF NO RELIEF AFTER 1ST DOSE CALL 911   pantoprazole  (PROTONIX ) 40 MG tablet Take 1 tablet (40 mg total) by mouth once daily.   potassium chloride  SA (KLOR-CON  M) 20 MEQ tablet Take 1 tablet (20 mEq total) by mouth 2 (two) times daily.   sacubitril -valsartan  (ENTRESTO ) 24-26 MG Take 1 tablet by mouth 2 (two) times daily.   simvastatin  (ZOCOR ) 20 MG tablet Take 1 tablet (20 mg total) by mouth once daily.   spironolactone  (ALDACTONE ) 25 MG tablet Take 1 tablet (25 mg total) by mouth daily.   torsemide  (DEMADEX ) 20 MG tablet Take 3 tablets (60 mg total) by mouth daily.   [DISCONTINUED] metFORMIN  (GLUCOPHAGE -XR) 750 MG 24 hr tablet Take 1 tablet (750 mg total) by mouth daily with breakfast.   [DISCONTINUED] ranolazine  (RANEXA ) 500 MG 12 hr tablet Take 1 tablet (500 mg total) by mouth 2 (two) times daily.   [DISCONTINUED] Semaglutide , 1 MG/DOSE, (OZEMPIC , 1 MG/DOSE,) 4 MG/3ML SOPN Inject 1 mg into the skin once a week. Instead of Trulicity    [DISCONTINUED] diazepam  (VALIUM ) 10 MG tablet Take 1 tablet 1 hour prior to procedure (Patient not taking: Reported on 08/31/2023)   [DISCONTINUED] diazepam  (VALIUM ) 10 MG tablet Take 1 tablet (10 mg total) by mouth daily 1 hour prior to procedure. (Patient not taking: Reported on 08/31/2023)   No facility-administered medications prior to visit.    Review of Systems  Constitutional:  Negative for weight loss.  HENT:  Positive for hearing loss.   Eyes: Negative.   Respiratory: Negative.    Cardiovascular: Negative.   Gastrointestinal: Negative.   Genitourinary: Negative.   Musculoskeletal:  Positive for joint pain.  Skin: Negative.   Neurological: Negative.  Endo/Heme/Allergies: Negative.        Objective:   BP  120/80   Pulse 74   Ht 5\' 7"  (1.702 m)   Wt 269 lb 12.8 oz (122.4 kg)   LMP 06/09/2018   SpO2 97%   BMI 42.26 kg/m   Vitals:   08/31/23 1540  BP: 120/80  Pulse: 74  Height: 5\' 7"  (1.702 m)  Weight: 269 lb 12.8 oz (122.4 kg)  SpO2: 97%  BMI (Calculated): 42.25    Physical Exam Vitals reviewed.  Constitutional:      General: She is not in acute distress.    Appearance: She is obese.  HENT:     Head: Normocephalic.     Right Ear: Decreased hearing noted.     Left Ear: Decreased hearing noted.     Nose: Nose normal.     Mouth/Throat:     Mouth: Mucous membranes are moist.  Eyes:     Extraocular Movements: Extraocular movements intact.     Pupils: Pupils are equal, round, and reactive to light.  Cardiovascular:     Rate and Rhythm: Normal rate and regular rhythm.     Heart sounds: No murmur heard. Pulmonary:     Effort: Pulmonary effort is normal.     Breath sounds: No rhonchi or rales.  Abdominal:     General: Abdomen is flat.     Palpations: There is no hepatomegaly, splenomegaly or mass.  Musculoskeletal:     Right shoulder: Decreased range of motion.     Left shoulder: Decreased range of motion.     Cervical back: Normal range of motion. No tenderness.  Skin:    General: Skin is warm and dry.     Findings: Rash present. Rash is urticarial.  Neurological:     General: No focal deficit present.     Mental Status: She is alert and oriented to person, place, and time.     Cranial Nerves: No cranial nerve deficit.     Motor: No weakness.  Psychiatric:        Mood and Affect: Mood normal.        Behavior: Behavior normal.      Results for orders placed or performed in visit on 08/31/23  POCT Glucose (CBG)  Result Value Ref Range   POC Glucose 102 (A) 70 - 99 mg/dl    Recent Results (from the past 2160 hours)  ECHOCARDIOGRAM COMPLETE     Status: None   Collection Time: 07/06/23 11:18 AM  Result Value Ref Range   Ao pk vel 1.51 m/Dior Dominik   AV Area VTI 2.53  cm2   AR max vel 2.65 cm2   AV Mean grad 5.5 mmHg   AV Peak grad 9.1 mmHg   Ireene Ballowe' Lateral 3.40 cm   AV Area mean vel 2.46 cm2   Area-P 1/2 3.07 cm2   MV VTI 2.75 cm2   Est EF 50 - 55%   Comprehensive metabolic panel with GFR     Status: Abnormal   Collection Time: 08/30/23  9:47 AM  Result Value Ref Range   Glucose 95 70 - 99 mg/dL   BUN 18 6 - 24 mg/dL   Creatinine, Ser 6.21 (H) 0.57 - 1.00 mg/dL   eGFR 61 >30 QM/VHQ/4.69   BUN/Creatinine Ratio 17 9 - 23   Sodium 141 134 - 144 mmol/L   Potassium 4.3 3.5 - 5.2 mmol/L   Chloride 104 96 - 106 mmol/L   CO2 24 20 - 29  mmol/L   Calcium 9.1 8.7 - 10.2 mg/dL   Total Protein 6.3 6.0 - 8.5 g/dL   Albumin 4.0 3.8 - 4.9 g/dL   Globulin, Total 2.3 1.5 - 4.5 g/dL   Bilirubin Total 0.2 0.0 - 1.2 mg/dL   Alkaline Phosphatase 86 44 - 121 IU/L   AST 9 0 - 40 IU/L   ALT 10 0 - 32 IU/L  Lipid panel     Status: Abnormal   Collection Time: 08/30/23  9:47 AM  Result Value Ref Range   Cholesterol, Total 147 100 - 199 mg/dL   Triglycerides 098 0 - 149 mg/dL   HDL 35 (L) >11 mg/dL   VLDL Cholesterol Cal 22 5 - 40 mg/dL   LDL Chol Calc (NIH) 90 0 - 99 mg/dL   Chol/HDL Ratio 4.2 0.0 - 4.4 ratio    Comment:                                   T. Chol/HDL Ratio                                             Men  Women                               1/2 Avg.Risk  3.4    3.3                                   Avg.Risk  5.0    4.4                                2X Avg.Risk  9.6    7.1                                3X Avg.Risk 23.4   11.0   Hemoglobin A1c     Status: None   Collection Time: 08/30/23  9:47 AM  Result Value Ref Range   Hgb A1c MFr Bld 5.3 4.8 - 5.6 %    Comment:          Prediabetes: 5.7 - 6.4          Diabetes: >6.4          Glycemic control for adults with diabetes: <7.0    Est. average glucose Bld gHb Est-mCnc 105 mg/dL  POCT Glucose (CBG)     Status: Abnormal   Collection Time: 08/31/23  3:47 PM  Result Value Ref Range   POC Glucose  102 (A) 70 - 99 mg/dl      Assessment & Plan:  As per problem list.  Problem List Items Addressed This Visit       Cardiovascular and Mediastinum   Hypertension     Endocrine   Type 2 diabetes mellitus without complications (HCC) - Primary   Relevant Medications   metFORMIN  (GLUCOPHAGE -XR) 500 MG 24 hr tablet   Semaglutide , 2 MG/DOSE, (OZEMPIC , 2 MG/DOSE,) 8 MG/3ML SOPN   Other Relevant Orders   POCT Glucose (CBG) (Completed)     Other  Obesity   Relevant Medications   metFORMIN  (GLUCOPHAGE -XR) 500 MG 24 hr tablet   Semaglutide , 2 MG/DOSE, (OZEMPIC , 2 MG/DOSE,) 8 MG/3ML SOPN   Hyperlipidemia   Other Visit Diagnoses       Urticaria due to food allergy           Return in about 3 months (around 12/01/2023) for fu with labs prior.   Total time spent: 20 minutes  Arzella Bitters, MD  08/31/2023   This document may have been prepared by [facility] Voice Recognition software and as such may include unintentional dictation errors.

## 2023-09-05 ENCOUNTER — Other Ambulatory Visit: Payer: Self-pay

## 2023-09-07 ENCOUNTER — Other Ambulatory Visit: Payer: Self-pay

## 2023-09-18 ENCOUNTER — Other Ambulatory Visit: Payer: Self-pay | Admitting: Medical

## 2023-09-18 ENCOUNTER — Other Ambulatory Visit: Payer: Self-pay

## 2023-09-18 MED ORDER — ENTRESTO 24-26 MG PO TABS
1.0000 | ORAL_TABLET | Freq: Two times a day (BID) | ORAL | 3 refills | Status: AC
  Filled 2023-09-18: qty 180, 90d supply, fill #0
  Filled 2023-09-19: qty 60, 30d supply, fill #0
  Filled 2023-10-14: qty 60, 30d supply, fill #1
  Filled 2023-11-14: qty 60, 30d supply, fill #2
  Filled 2023-12-14: qty 60, 30d supply, fill #3
  Filled 2024-01-08: qty 60, 30d supply, fill #4
  Filled 2024-02-12: qty 60, 30d supply, fill #5
  Filled 2024-03-10: qty 60, 30d supply, fill #6
  Filled 2024-04-14: qty 60, 30d supply, fill #7
  Filled 2024-05-13: qty 60, 30d supply, fill #8

## 2023-09-19 ENCOUNTER — Other Ambulatory Visit: Payer: Self-pay

## 2023-10-14 ENCOUNTER — Other Ambulatory Visit: Payer: Self-pay | Admitting: Family

## 2023-10-14 ENCOUNTER — Other Ambulatory Visit: Payer: Self-pay

## 2023-10-14 DIAGNOSIS — K219 Gastro-esophageal reflux disease without esophagitis: Secondary | ICD-10-CM

## 2023-10-15 ENCOUNTER — Other Ambulatory Visit: Payer: Self-pay

## 2023-10-24 ENCOUNTER — Other Ambulatory Visit: Payer: Self-pay | Admitting: Family

## 2023-10-24 DIAGNOSIS — K219 Gastro-esophageal reflux disease without esophagitis: Secondary | ICD-10-CM

## 2023-10-25 ENCOUNTER — Other Ambulatory Visit: Payer: Self-pay | Admitting: Family

## 2023-10-25 ENCOUNTER — Other Ambulatory Visit: Payer: Self-pay

## 2023-10-25 DIAGNOSIS — K219 Gastro-esophageal reflux disease without esophagitis: Secondary | ICD-10-CM

## 2023-10-26 ENCOUNTER — Other Ambulatory Visit: Payer: Self-pay

## 2023-10-27 ENCOUNTER — Other Ambulatory Visit: Payer: Self-pay | Admitting: Family

## 2023-10-27 DIAGNOSIS — K219 Gastro-esophageal reflux disease without esophagitis: Secondary | ICD-10-CM

## 2023-10-29 ENCOUNTER — Other Ambulatory Visit: Payer: Self-pay | Admitting: Family

## 2023-10-29 ENCOUNTER — Other Ambulatory Visit: Payer: Self-pay

## 2023-10-29 DIAGNOSIS — K219 Gastro-esophageal reflux disease without esophagitis: Secondary | ICD-10-CM

## 2023-10-30 ENCOUNTER — Other Ambulatory Visit: Payer: Self-pay

## 2023-10-31 ENCOUNTER — Other Ambulatory Visit: Payer: Self-pay

## 2023-11-01 ENCOUNTER — Other Ambulatory Visit: Payer: Self-pay

## 2023-11-01 ENCOUNTER — Other Ambulatory Visit: Payer: Self-pay | Admitting: Internal Medicine

## 2023-11-01 DIAGNOSIS — K219 Gastro-esophageal reflux disease without esophagitis: Secondary | ICD-10-CM

## 2023-11-02 ENCOUNTER — Other Ambulatory Visit: Payer: Self-pay

## 2023-11-02 MED FILL — Pantoprazole Sodium EC Tab 40 MG (Base Equiv): ORAL | 90 days supply | Qty: 90 | Fill #0 | Status: AC

## 2023-11-02 MED FILL — Torsemide Tab 20 MG: ORAL | 90 days supply | Qty: 270 | Fill #0 | Status: AC

## 2023-11-02 MED FILL — Spironolactone Tab 25 MG: ORAL | 90 days supply | Qty: 90 | Fill #0 | Status: AC

## 2023-11-03 ENCOUNTER — Other Ambulatory Visit: Payer: Self-pay | Admitting: Medical

## 2023-11-05 ENCOUNTER — Other Ambulatory Visit: Payer: Self-pay

## 2023-11-05 MED ORDER — ISOSORBIDE MONONITRATE ER 60 MG PO TB24
60.0000 mg | ORAL_TABLET | Freq: Every day | ORAL | 1 refills | Status: DC
  Filled 2023-11-05: qty 90, 90d supply, fill #0
  Filled 2024-02-04: qty 90, 90d supply, fill #1

## 2023-11-07 ENCOUNTER — Other Ambulatory Visit: Payer: Self-pay

## 2023-11-07 ENCOUNTER — Other Ambulatory Visit: Payer: Self-pay | Admitting: Cardiovascular Disease

## 2023-11-07 MED ORDER — RANOLAZINE ER 500 MG PO TB12
500.0000 mg | ORAL_TABLET | Freq: Two times a day (BID) | ORAL | 0 refills | Status: DC
  Filled 2023-11-07: qty 180, 90d supply, fill #0

## 2023-11-07 NOTE — Telephone Encounter (Signed)
 The patient was asked if she is still taking her ranexa  medication and the patient said yes and that she didn't know how it got discontinued.  The patient's refill was sent to her pharmacy.

## 2023-11-16 MED FILL — Potassium Chloride Microencapsulated Crys ER Tab 20 mEq: ORAL | 90 days supply | Qty: 180 | Fill #1 | Status: AC

## 2023-11-20 ENCOUNTER — Other Ambulatory Visit

## 2023-11-22 ENCOUNTER — Other Ambulatory Visit: Payer: Self-pay | Admitting: Internal Medicine

## 2023-11-22 ENCOUNTER — Other Ambulatory Visit: Payer: Self-pay

## 2023-11-22 DIAGNOSIS — E119 Type 2 diabetes mellitus without complications: Secondary | ICD-10-CM

## 2023-11-23 ENCOUNTER — Other Ambulatory Visit: Payer: Self-pay

## 2023-11-23 MED FILL — Metformin HCl Tab ER 24HR 500 MG: ORAL | 90 days supply | Qty: 90 | Fill #0 | Status: AC

## 2023-11-26 ENCOUNTER — Other Ambulatory Visit: Payer: Self-pay

## 2023-12-05 ENCOUNTER — Other Ambulatory Visit: Payer: Self-pay

## 2023-12-06 ENCOUNTER — Other Ambulatory Visit

## 2023-12-07 ENCOUNTER — Ambulatory Visit: Admitting: Internal Medicine

## 2023-12-07 ENCOUNTER — Encounter: Payer: Self-pay | Admitting: Internal Medicine

## 2023-12-07 ENCOUNTER — Other Ambulatory Visit: Payer: Self-pay

## 2023-12-07 ENCOUNTER — Ambulatory Visit: Payer: Self-pay | Admitting: Internal Medicine

## 2023-12-07 VITALS — BP 128/82 | HR 82 | Ht 67.0 in | Wt 266.2 lb

## 2023-12-07 DIAGNOSIS — M25562 Pain in left knee: Secondary | ICD-10-CM

## 2023-12-07 DIAGNOSIS — Z130 Encounter for screening for diseases of the blood and blood-forming organs and certain disorders involving the immune mechanism: Secondary | ICD-10-CM

## 2023-12-07 DIAGNOSIS — E119 Type 2 diabetes mellitus without complications: Secondary | ICD-10-CM | POA: Diagnosis not present

## 2023-12-07 DIAGNOSIS — Z0130 Encounter for examination of blood pressure without abnormal findings: Secondary | ICD-10-CM

## 2023-12-07 DIAGNOSIS — Z1329 Encounter for screening for other suspected endocrine disorder: Secondary | ICD-10-CM | POA: Diagnosis not present

## 2023-12-07 DIAGNOSIS — M25561 Pain in right knee: Secondary | ICD-10-CM

## 2023-12-07 DIAGNOSIS — Z13228 Encounter for screening for other metabolic disorders: Secondary | ICD-10-CM

## 2023-12-07 LAB — LIPID PANEL
Chol/HDL Ratio: 4.8 ratio — ABNORMAL HIGH (ref 0.0–4.4)
Cholesterol, Total: 157 mg/dL (ref 100–199)
HDL: 33 mg/dL — ABNORMAL LOW (ref >39)
LDL Chol Calc (NIH): 99 mg/dL (ref 0–99)
Triglycerides: 140 mg/dL (ref 0–149)
VLDL Cholesterol Cal: 25 mg/dL (ref 5–40)

## 2023-12-07 LAB — COMPREHENSIVE METABOLIC PANEL WITH GFR
ALT: 11 IU/L (ref 0–32)
AST: 10 IU/L (ref 0–40)
Albumin: 4.2 g/dL (ref 3.8–4.9)
Alkaline Phosphatase: 80 IU/L (ref 44–121)
BUN/Creatinine Ratio: 13 (ref 9–23)
BUN: 12 mg/dL (ref 6–24)
Bilirubin Total: 0.4 mg/dL (ref 0.0–1.2)
CO2: 21 mmol/L (ref 20–29)
Calcium: 9.4 mg/dL (ref 8.7–10.2)
Chloride: 102 mmol/L (ref 96–106)
Creatinine, Ser: 0.93 mg/dL (ref 0.57–1.00)
Globulin, Total: 2.4 g/dL (ref 1.5–4.5)
Glucose: 98 mg/dL (ref 70–99)
Potassium: 4 mmol/L (ref 3.5–5.2)
Sodium: 140 mmol/L (ref 134–144)
Total Protein: 6.6 g/dL (ref 6.0–8.5)
eGFR: 72 mL/min/1.73 (ref >59)

## 2023-12-07 LAB — HEMOGLOBIN A1C
Est. average glucose Bld gHb Est-mCnc: 108 mg/dL
Hgb A1c MFr Bld: 5.4 % (ref 4.8–5.6)

## 2023-12-07 LAB — POCT CBG (FASTING - GLUCOSE)-MANUAL ENTRY: Glucose Fasting, POC: 134 mg/dL — AB (ref 70–99)

## 2023-12-07 MED ORDER — MOUNJARO 7.5 MG/0.5ML ~~LOC~~ SOAJ
7.5000 mg | SUBCUTANEOUS | 1 refills | Status: DC
  Filled 2023-12-07 – 2023-12-20 (×6): qty 2, 28d supply, fill #0
  Filled 2024-01-08 – 2024-01-10 (×3): qty 2, 28d supply, fill #1

## 2023-12-07 NOTE — Progress Notes (Signed)
 Established Patient Office Visit  Subjective:  Patient ID: Kathy [name], female    DOB: Jul 20, 1965  Age: 58 y.o. MRN: [id number]  Chief Complaint  Patient presents with   Follow-up    3 month lab results    No new complaints, here for lab review and medication refills. Labs reviewed and notable for well controlled diabetes, A1c at target and lipids at target. Denies any hypoglycemic episodes and home bg readings have been at target. C/o chronic bilat knee pain and surgery delayed till she loses weight.     No other concerns at this time.   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 02/2016 Echo: EF 45%; b. 03/2016 Echo: EF 55%; c. 12/2019 Echo: EF 55-60%, no rwma, gr2 DD, nl RV size/fxn, mildly dil RA, mild MR.   Asthma    Chronic chest pain    a. ? vasospasm vs endothelial dysfxn. Improved w/ Ranexa  but couldn't afford.   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 Presumed in setting of prior h/o MI w/ insignificant coronary dzs.   Diabetes mellitus without complication (HCC)    Heart murmur    History of NICM (nonischemic cardiomyopathy) (HCC)    a. 02/2016 Echo: EF 45%; b. 12/2019 Echo: EF 55-60%.   Hyperlipidemia    Hypertension    Myocardial infarction [facility])    Non-obstructive Coronary artery disease    a. Prior caths in 2007, 2011, 2012 - nonobs dzs; b. 03/2016 Cath ([facility]): LM nl, LAD/LCX/RCA insignificant dzs.   Obesity    Rate-dependent LBBB (left bundle branch block)    Remote Tobacco abuse    a. 30+ pack year history - quit 2019.   Seizures (HCC)     Past Surgical History:  Procedure Laterality Date   ANTERIOR CRUCIATE LIGAMENT REPAIR     BACK SURGERY     L4 and L5   CARDIAC CATHETERIZATION  04/09/2016   COLONOSCOPY     COLONOSCOPY WITH PROPOFOL  N/A 08/04/2019   Procedure: COLONOSCOPY WITH PROPOFOL ;  Surgeon: Unk Corinn Skiff, MD;  Location: [facility] ENDOSCOPY;  Service: Gastroenterology;   Laterality: N/A;   EMPYEMA DRAINAGE     at age 68   RIGHT/LEFT HEART CATH AND CORONARY ANGIOGRAPHY Bilateral 07/04/2022   Procedure: RIGHT/LEFT HEART CATH AND CORONARY ANGIOGRAPHY;  Surgeon: Darron Deatrice LABOR, MD;  Location: [facility] INVASIVE CV LAB;  Service: Cardiovascular;  Laterality: Bilateral;   TONSILLECTOMY     TONSILLECTOMY AND ADENOIDECTOMY  2008   TUBAL LIGATION      Social History   Socioeconomic History   Marital status: Divorced    Spouse name: Not on file   Number of children: 2   Years of education: pre-requisites    Highest education level: GED or equivalent  Occupational History   Occupation: unemployed  Tobacco Use   Smoking status: Former    Current packs/day: 0.00    Average packs/day: 1 pack/day for 30.0 years (30.0 ttl pk-yrs)    Types: Cigarettes    Start date: 08/17/1987    Quit date: 08/16/2017    Years since quitting: 6.3   Smokeless tobacco: Never  Vaping Use   Vaping status: Never Used  Substance and Sexual Activity   Alcohol use: Not Currently    Comment: last use 01/2022   Drug use: Not Currently    Comment: Previously used cocaine in the 1990s   Sexual activity: Yes    Birth  control/protection: Condom  Other Topics Concern   Not on file  Social History Narrative   Currently on food stamps. They help somewhat but still difficult to buy food that she needs. Boyfriend helps pay rent with disability, they live together. Roof is leaking really badly. Lives in modular home. Social services bought tarp which helped, but it deteriorated from heat.    Social Drivers of Corporate investment banker Strain: High Risk (02/12/2018)   Overall Financial Resource Strain (CARDIA)    Difficulty of Paying Living Expenses: Hard  Food Insecurity: No Food Insecurity (10/20/2021)   Hunger Vital Sign    Worried About Running Out of Food in the Last Year: Never true    Ran Out of Food in the Last Year: Never true  Transportation Needs: No Transportation Needs  (10/20/2021)   PRAPARE - Administrator, Civil Service (Medical): No    Lack of Transportation (Non-Medical): No  Physical Activity: Sufficiently Active (11/06/2017)   Exercise Vital Sign    Days of Exercise per Week: 2 days    Minutes of Exercise per Session: 100 min  Stress: Stress Concern Present (11/06/2017)   [employer] of Occupational Health - Occupational Stress Questionnaire    Feeling of Stress : To some extent  Social Connections: Moderately Isolated (11/06/2017)   Social Connection and Isolation Panel    Frequency of Communication with Friends and Family: More than three times a week    Frequency of Social Gatherings with Friends and Family: Once a week    Attends Religious Services: Never    Database administrator or Organizations: No    Attends Banker Meetings: Never    Marital Status: Divorced  Catering manager Violence: Not At Risk (11/06/2017)   Humiliation, Afraid, Rape, and Kick questionnaire    Fear of Current or Ex-Partner: No    Emotionally Abused: No    Physically Abused: No    Sexually Abused: No    Family History  Problem Relation Age of Onset   Kidney disease Sister    Diabetes Sister    COPD Sister    Stroke Sister    Thyroid  disease Sister    Diabetes Mother    Stroke Mother    Heart attack Mother    Thyroid  disease Mother    Colon cancer Mother    Stroke Father    Heart attack Father    Asthma Daughter    Hypertension Daughter    Thyroid  disease Daughter    Asthma Son    Heart disease Maternal Grandmother    Hypertension Maternal Grandmother    Colon cancer Maternal Grandfather    Hypertension Maternal Grandfather    Hyperlipidemia Maternal Grandfather    Stroke Paternal Grandmother    Hypertension Paternal Grandfather    Hyperlipidemia Paternal Grandfather    Heart disease Paternal Grandfather    Thyroid  disease Sister    Migraines Sister     Allergies  Allergen Reactions   Aspirin  Anaphylaxis, Hives  and Swelling    Occurred as a child Occurred as a child   Beef Allergy Hives   Celebrex [Celecoxib] Anaphylaxis and Hives   Fentanyl Anaphylaxis   Fluticasone  Propionate Other (See Comments) and Swelling    Swelling, pain, and itching   Gabapentin Shortness Of Breath   Hydrocodone-Acetaminophen  Hives, Other (See Comments) and Swelling    Feels like her tongue swells. Trouble swallowing   Latex Hives   Lisinopril  Shortness Of Breath   Morphine  Anaphylaxis and Swelling   Oxycodone-Acetaminophen  Hives and Shortness Of Breath   Percocet [Oxycodone-Acetaminophen ] Hives and Shortness Of Breath   Pork Allergy Hives   Simvastatin  Hives and Shortness Of Breath    Can tolerate 20 mg dose.   Skelaxin [Metaxalone] Anaphylaxis   Beef-Derived Drug Products Hives   Pork-Derived Products Hives   Vicodin [Hydrocodone-Acetaminophen ] Hives and Other (See Comments)    Feels like her tongue swells. Trouble swallowing   Albuterol Palpitations   Jardiance  [Empagliflozin ] Nausea Only and Palpitations    Outpatient Medications Prior to Visit  Medication Sig   Accu-Chek Softclix Lancets lancets Use as instructed   Accu-Chek Softclix Lancets lancets Use as instructed   blood glucose meter kit and supplies KIT Dispense based on patient and insurance preference. Use up to four times daily as directed. (FOR ICD-9 250.00, 250.01).   Blood Pressure Monitoring (BLOOD PRESSURE KIT) KIT 1 kit by Does not apply route daily as needed.   clopidogrel  (PLAVIX ) 75 MG tablet Take 1 tablet (75 mg total) by mouth once daily.   diphenhydrAMINE  (BENADRYL ) 50 MG tablet Take 50 mg by mouth 2 (two) times daily.   EPINEPHrine  0.3 mg/0.3 mL IJ SOAJ injection Inject 0.3 into the muscle as needed for anaphylaxis   glucose blood (ACCU-CHEK GUIDE TEST) test strip Use as instructed   Insulin  Pen Needle 32G X 6 MM MISC Use one pen needle with Ozempic  once every week   isosorbide  mononitrate (IMDUR ) 60 MG 24 hr tablet Take 1 tablet  (60 mg total) by mouth daily.   metFORMIN  (GLUCOPHAGE -XR) 500 MG 24 hr tablet Take 1 tablet (500 mg total) by mouth daily with breakfast.   metoprolol  tartrate (LOPRESSOR ) 25 MG tablet Take 1 tablet (25 mg total) by mouth 2 (two) times daily.   nitroGLYCERIN  (NITROSTAT ) 0.4 MG SL tablet 1 TABLET UNDER TONGUE AS NEEDED FOR CHEST PAIN EVERY 5 MINUTES FOR MAX OF 3 DOSES IN 15 MINUTES; IF NO RELIEF AFTER 1ST DOSE CALL 911   pantoprazole  (PROTONIX ) 40 MG tablet Take 1 tablet (40 mg total) by mouth once daily.   potassium chloride  SA (KLOR-CON  M) 20 MEQ tablet Take 1 tablet (20 mEq total) by mouth 2 (two) times daily.   ranolazine  (RANEXA ) 500 MG 12 hr tablet Take 1 tablet (500 mg total) by mouth 2 (two) times daily.   sacubitril -valsartan  (ENTRESTO ) 24-26 MG Take 1 tablet by mouth 2 (two) times daily.   simvastatin  (ZOCOR ) 20 MG tablet Take 1 tablet (20 mg total) by mouth once daily.   spironolactone  (ALDACTONE ) 25 MG tablet Take 1 tablet (25 mg total) by mouth daily.   torsemide  (DEMADEX ) 20 MG tablet Take 3 tablets (60 mg total) by mouth daily.   No facility-administered medications prior to visit.    Review of Systems  Constitutional:  Positive for weight loss (3 lbs).  HENT:  Positive for hearing loss.   Eyes: Negative.   Respiratory: Negative.    Cardiovascular: Negative.   Gastrointestinal: Negative.   Genitourinary: Negative.   Musculoskeletal:  Positive for joint pain.  Skin: Negative.   Neurological: Negative.   Endo/Heme/Allergies: Negative.        Objective:   BP 128/82   Pulse 82   Ht 5' 7 (1.702 m)   Wt 266 lb 3.2 oz (120.7 kg)   LMP 06/09/2018   SpO2 97%   BMI 41.69 kg/m   Vitals:   12/07/23 1526  BP: 128/82  Pulse: 82  Height: 5' 7 (1.702 m)  Weight: 266 lb 3.2 oz (120.7 kg)  SpO2: 97%  BMI (Calculated): 41.68    Physical Exam Vitals reviewed.  Constitutional:      General: She is not in acute distress.    Appearance: She is obese.  HENT:      Head: Normocephalic.     Right Ear: Decreased hearing noted.     Left Ear: Decreased hearing noted.     Nose: Nose normal.     Mouth/Throat:     Mouth: Mucous membranes are moist.  Eyes:     Extraocular Movements: Extraocular movements intact.     Pupils: Pupils are equal, round, and reactive to light.  Cardiovascular:     Rate and Rhythm: Normal rate and regular rhythm.     Heart sounds: No murmur heard. Pulmonary:     Effort: Pulmonary effort is normal.     Breath sounds: No rhonchi or rales.  Abdominal:     General: Abdomen is flat.     Palpations: There is no hepatomegaly, splenomegaly or mass.  Musculoskeletal:     Right shoulder: Decreased range of motion.     Left shoulder: Decreased range of motion.     Cervical back: Normal range of motion. No tenderness.  Skin:    General: Skin is warm and dry.     Findings: Rash present. Rash is urticarial.  Neurological:     General: No focal deficit present.     Mental Status: She is alert and oriented to person, place, and time.     Cranial Nerves: No cranial nerve deficit.     Motor: No weakness.  Psychiatric:        Mood and Affect: Mood normal.        Behavior: Behavior normal.      Results for orders placed or performed in visit on 12/07/23  POCT CBG (Fasting - Glucose)  Result Value Ref Range   Glucose Fasting, POC 134 (A) 70 - 99 mg/dL    Recent Results (from the past 2160 hours)  Lipid panel     Status: Abnormal   Collection Time: 12/06/23  9:54 AM  Result Value Ref Range   Cholesterol, Total 157 100 - 199 mg/dL   Triglycerides 859 0 - 149 mg/dL   HDL 33 (L) >60 mg/dL   VLDL Cholesterol Cal 25 5 - 40 mg/dL   LDL Chol Calc (NIH) 99 0 - 99 mg/dL   Chol/HDL Ratio 4.8 (H) 0.0 - 4.4 ratio    Comment:                                   T. Chol/HDL Ratio                                             Men  Women                               1/2 Avg.Risk  3.4    3.3                                   Avg.Risk  5.0     4.4  2X Avg.Risk  9.6    7.1                                3X Avg.Risk 23.4   11.0   Hemoglobin A1c     Status: None   Collection Time: 12/06/23  9:55 AM  Result Value Ref Range   Hgb A1c MFr Bld 5.4 4.8 - 5.6 %    Comment:          Prediabetes: 5.7 - 6.4          Diabetes: >6.4          Glycemic control for adults with diabetes: <7.0    Est. average glucose Bld gHb Est-mCnc 108 mg/dL  Comprehensive metabolic panel     Status: None   Collection Time: 12/06/23  9:56 AM  Result Value Ref Range   Glucose 98 70 - 99 mg/dL   BUN 12 6 - 24 mg/dL   Creatinine, Ser 9.06 0.57 - 1.00 mg/dL   eGFR 72 >40 fO/fpw/8.26   BUN/Creatinine Ratio 13 9 - 23   Sodium 140 134 - 144 mmol/L   Potassium 4.0 3.5 - 5.2 mmol/L   Chloride 102 96 - 106 mmol/L   CO2 21 20 - 29 mmol/L   Calcium 9.4 8.7 - 10.2 mg/dL   Total Protein 6.6 6.0 - 8.5 g/dL   Albumin 4.2 3.8 - 4.9 g/dL   Globulin, Total 2.4 1.5 - 4.5 g/dL   Bilirubin Total 0.4 0.0 - 1.2 mg/dL   Alkaline Phosphatase 80 44 - 121 IU/L   AST 10 0 - 40 IU/L   ALT 11 0 - 32 IU/L  POCT CBG (Fasting - Glucose)     Status: Abnormal   Collection Time: 12/07/23  3:36 PM  Result Value Ref Range   Glucose Fasting, POC 134 (A) 70 - 99 mg/dL      Assessment & Plan:  Kathy [name] was seen today for follow-up.  Type 2 diabetes mellitus without complication, without long-term current use of insulin  (HCC) -     POCT CBG (Fasting - Glucose) -     Mounjaro ; Inject 7.5 mg into the skin once a week.  Dispense: 2 mL; Refill: 1  Hypercortisolism (HCC) -     Cortisol Dexamethasone Reflex   Replace Ozempic  with Mounjaro  to facilitate weight loss. Problem List Items Addressed This Visit       Endocrine   Type 2 diabetes mellitus without complications (HCC) - Primary   Relevant Medications   tirzepatide  (MOUNJARO ) 7.5 MG/0.5ML Pen   Other Relevant Orders   POCT CBG (Fasting - Glucose) (Completed)   Other Visit Diagnoses        Hypercortisolism (HCC)       Relevant Orders   Cortisol Dexamethasone Reflex       Return in about 3 months (around 03/08/2024) for fu with labs prior.   Total time spent: 20 minutes  Sherrill Cinderella Perry, MD  12/07/2023   This document may have been prepared by [employer] Voice Recognition software and as such may include unintentional dictation errors.

## 2023-12-10 ENCOUNTER — Other Ambulatory Visit: Payer: Self-pay

## 2023-12-11 ENCOUNTER — Other Ambulatory Visit: Payer: Self-pay

## 2023-12-12 ENCOUNTER — Other Ambulatory Visit: Payer: Self-pay

## 2023-12-14 ENCOUNTER — Other Ambulatory Visit: Payer: Self-pay

## 2023-12-17 ENCOUNTER — Other Ambulatory Visit: Payer: Self-pay

## 2023-12-19 ENCOUNTER — Other Ambulatory Visit: Payer: Self-pay

## 2023-12-19 ENCOUNTER — Telehealth: Payer: Self-pay

## 2023-12-19 NOTE — Telephone Encounter (Signed)
 Pt went to pick up Mounjaro  but it got denied. Please advise.

## 2023-12-20 ENCOUNTER — Other Ambulatory Visit: Payer: Self-pay

## 2023-12-20 NOTE — Telephone Encounter (Signed)
 Received denial today, I will be working on appeal for the medication

## 2024-01-08 ENCOUNTER — Other Ambulatory Visit: Payer: Self-pay

## 2024-01-08 ENCOUNTER — Ambulatory Visit: Admitting: Cardiovascular Disease

## 2024-01-08 MED FILL — Pantoprazole Sodium EC Tab 40 MG (Base Equiv): ORAL | 90 days supply | Qty: 90 | Fill #1 | Status: AC

## 2024-01-10 ENCOUNTER — Other Ambulatory Visit: Payer: Self-pay | Admitting: Medical Genetics

## 2024-01-10 ENCOUNTER — Other Ambulatory Visit: Payer: Self-pay

## 2024-01-10 ENCOUNTER — Ambulatory Visit: Attending: Cardiovascular Disease | Admitting: Cardiovascular Disease

## 2024-01-10 ENCOUNTER — Encounter: Payer: Self-pay | Admitting: Cardiovascular Disease

## 2024-01-10 VITALS — BP 110/70 | HR 69 | Ht 66.0 in | Wt 273.1 lb

## 2024-01-10 DIAGNOSIS — I5022 Chronic systolic (congestive) heart failure: Secondary | ICD-10-CM | POA: Diagnosis not present

## 2024-01-10 DIAGNOSIS — E785 Hyperlipidemia, unspecified: Secondary | ICD-10-CM | POA: Diagnosis not present

## 2024-01-10 DIAGNOSIS — I25118 Atherosclerotic heart disease of native coronary artery with other forms of angina pectoris: Secondary | ICD-10-CM | POA: Diagnosis not present

## 2024-01-10 DIAGNOSIS — I10 Essential (primary) hypertension: Secondary | ICD-10-CM | POA: Diagnosis not present

## 2024-01-10 MED ORDER — ROSUVASTATIN CALCIUM 20 MG PO TABS
20.0000 mg | ORAL_TABLET | Freq: Every day | ORAL | 3 refills | Status: AC
  Filled 2024-01-10: qty 90, 90d supply, fill #0
  Filled 2024-03-26: qty 90, 90d supply, fill #1

## 2024-01-10 NOTE — Progress Notes (Signed)
 Cardiology Office Note   Date:  01/10/2024   ID:  Kathy [name], DOB November 29, 1965, MRN [id number]  PCP:  Albina GORMAN Dine, MD  Cardiologist:   Deatrice Cage, MD   Chief Complaint  Patient presents with   Follow-up    6 month f/u c/o unable to sleep. Meds reviewed verbally with pt.      History of Present Illness: Kathy [name] is a 58 y.o. female who is here today for follow-up visit regarding chronic chest pain, intermittent left bundle branch block and chronic systolic heart failure.    The patient has known history of recurrent chest pain usually responsive to nitroglycerin  which is thought to be due to coronary spasm or endothelial dysfunction.    She had cardiac catheterization done 4 times in the past in 2007, 2011, 2012 , 2017. All these showed mild nonobstructive coronary artery disease.  She is known to have  left bundle branch block.  She is allergic to aspirin  and thus on long-term Plavix .  Other medical problems include previous tobacco use, obesity, chronic back pain and hyperlipidemia.   She had an echocardiogram done in February of  2024 which showed an EF of 40 to 45%.  This was followed by left heart catheterization which showed minimal nonobstructive coronary artery disease.  EF was 50%.  Right heart catheterization showed normal filling pressures, normal pulmonary pressure and normal cardiac output.  Repeat echocardiogram in March of this year showed an EF of 50 to 55%. She has been doing reasonably well with minimal chest pain at this time.  She has stable exertional dyspnea.  She is back on torsemide  60 mg daily as she had more shortness of breath and lower extremity edema when torsemide  was decreased to 40 mg daily.  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 02/2016 Echo: EF 45%; b. 03/2016 Echo: EF 55%; c. 12/2019 Echo: EF 55-60%, no rwma, gr2 DD, nl RV size/fxn, mildly dil RA, mild MR.    Asthma    Chronic chest pain    a. ? vasospasm vs endothelial dysfxn. Improved w/ Ranexa  but couldn't afford.   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 Presumed in setting of prior h/o MI w/ insignificant coronary dzs.   Diabetes mellitus without complication (HCC)    Heart murmur    History of NICM (nonischemic cardiomyopathy) (HCC)    a. 02/2016 Echo: EF 45%; b. 12/2019 Echo: EF 55-60%.   Hyperlipidemia    Hypertension    Myocardial infarction [facility])    Non-obstructive Coronary artery disease    a. Prior caths in 2007, 2011, 2012 - nonobs dzs; b. 03/2016 Cath ([facility]): LM nl, LAD/LCX/RCA insignificant dzs.   Obesity    Rate-dependent LBBB (left bundle branch block)    Remote Tobacco abuse    a. 30+ pack year history - quit 2019.   Seizures (HCC)     Past Surgical History:  Procedure Laterality Date   ANTERIOR CRUCIATE LIGAMENT REPAIR     BACK SURGERY     L4 and L5   CARDIAC CATHETERIZATION  04/09/2016   COLONOSCOPY     COLONOSCOPY WITH PROPOFOL  N/A 08/04/2019   Procedure: COLONOSCOPY WITH PROPOFOL ;  Surgeon: Unk Corinn Skiff, MD;  Location: [facility] ENDOSCOPY;  Service: Gastroenterology;  Laterality: N/A;   EMPYEMA DRAINAGE     at age 17   RIGHT/LEFT HEART CATH AND CORONARY ANGIOGRAPHY Bilateral 07/04/2022  Procedure: RIGHT/LEFT HEART CATH AND CORONARY ANGIOGRAPHY;  Surgeon: Darron Deatrice LABOR, MD;  Location: [facility] INVASIVE CV LAB;  Service: Cardiovascular;  Laterality: Bilateral;   TONSILLECTOMY     TONSILLECTOMY AND ADENOIDECTOMY  2008   TUBAL LIGATION       Current Outpatient Medications  Medication Sig Dispense Refill   Accu-Chek Softclix Lancets lancets Use as instructed 100 each 12   Accu-Chek Softclix Lancets lancets Use as instructed 100 each 12   blood glucose meter kit and supplies KIT Dispense based on patient and insurance preference. Use up to four times daily as directed. (FOR ICD-9 250.00, 250.01). 1 each 0   Blood Pressure Monitoring (BLOOD PRESSURE KIT) KIT 1 kit  by Does not apply route daily as needed. 1 kit 0   clopidogrel  (PLAVIX ) 75 MG tablet Take 1 tablet (75 mg total) by mouth once daily. 90 tablet 2   diphenhydrAMINE  (BENADRYL ) 50 MG tablet Take 50 mg by mouth 2 (two) times daily.     EPINEPHrine  0.3 mg/0.3 mL IJ SOAJ injection Inject 0.3 into the muscle as needed for anaphylaxis 4 each 6   glucose blood (ACCU-CHEK GUIDE TEST) test strip Use as instructed 100 each 12   Insulin  Pen Needle 32G X 6 MM MISC Use one pen needle with Ozempic  once every week 100 each 3   isosorbide  mononitrate (IMDUR ) 60 MG 24 hr tablet Take 1 tablet (60 mg total) by mouth daily. 90 tablet 1   metFORMIN  (GLUCOPHAGE -XR) 500 MG 24 hr tablet Take 1 tablet (500 mg total) by mouth daily with breakfast. 90 tablet 0   metoprolol  tartrate (LOPRESSOR ) 25 MG tablet Take 1 tablet (25 mg total) by mouth 2 (two) times daily. 180 tablet 3   nitroGLYCERIN  (NITROSTAT ) 0.4 MG SL tablet 1 TABLET UNDER TONGUE AS NEEDED FOR CHEST PAIN EVERY 5 MINUTES FOR MAX OF 3 DOSES IN 15 MINUTES; IF NO RELIEF AFTER 1ST DOSE CALL 911 25 tablet 0   pantoprazole  (PROTONIX ) 40 MG tablet Take 1 tablet (40 mg total) by mouth once daily. 90 tablet 1   potassium chloride  SA (KLOR-CON  M) 20 MEQ tablet Take 1 tablet (20 mEq total) by mouth 2 (two) times daily. 180 tablet 3   ranolazine  (RANEXA ) 500 MG 12 hr tablet Take 1 tablet (500 mg total) by mouth 2 (two) times daily. 180 tablet 0   sacubitril -valsartan  (ENTRESTO ) 24-26 MG Take 1 tablet by mouth 2 (two) times daily. 180 tablet 3   simvastatin  (ZOCOR ) 20 MG tablet Take 1 tablet (20 mg total) by mouth once daily. 90 tablet 1   spironolactone  (ALDACTONE ) 25 MG tablet Take 1 tablet (25 mg total) by mouth daily. 90 tablet 1   tirzepatide  (MOUNJARO ) 7.5 MG/0.5ML Pen Inject 7.5 mg into the skin once a week. 2 mL 1   torsemide  (DEMADEX ) 20 MG tablet Take 3 tablets (60 mg total) by mouth daily. 270 tablet 1   No current facility-administered medications for this visit.     Allergies:   Aspirin , Beef allergy, Celebrex [celecoxib], Fentanyl, Fluticasone  propionate, Gabapentin, Hydrocodone-acetaminophen , Latex, Lisinopril , Morphine, Oxycodone-acetaminophen , Percocet [oxycodone-acetaminophen ], Pork allergy, Simvastatin , Skelaxin [metaxalone], Beef-derived drug products, Pork-derived products, Vicodin [hydrocodone-acetaminophen ], Albuterol, and Jardiance  [empagliflozin ]    Social History:  The patient  reports that she quit smoking about 6 years ago. Her smoking use included cigarettes. She started smoking about 36 years ago. She has a 30 pack-year smoking history. She has never used smokeless tobacco. She reports that she does not currently use alcohol.  She reports that she does not currently use drugs.   Family History:  The patient's family history includes Asthma in her daughter and son; COPD in her sister; Colon cancer in her maternal grandfather and mother; Diabetes in her mother and sister; Heart attack in her father and mother; Heart disease in her maternal grandmother and paternal grandfather; Hyperlipidemia in her maternal grandfather and paternal grandfather; Hypertension in her daughter, maternal grandfather, maternal grandmother, and paternal grandfather; Kidney disease in her sister; Migraines in her sister; Stroke in her father, mother, paternal grandmother, and sister; Thyroid  disease in her daughter, mother, sister, and sister.    ROS:  Please see the history of present illness.   Otherwise, review of systems are positive for none.   All other systems are reviewed and negative.    PHYSICAL EXAM: VS:  BP 110/70 (BP Location: Left Arm, Patient Position: Sitting, Cuff Size: Large)   Pulse 69   Ht 5' 6 (1.676 m)   Wt 273 lb 2 oz (123.9 kg)   LMP 06/09/2018   SpO2 99%   BMI 44.08 kg/m  , BMI Body mass index is 44.08 kg/m. GEN: Well nourished, well developed, in no acute distress  HEENT: normal  Neck: no JVD, carotid bruits, or masses Cardiac: RRR;  no  rubs, or gallops .  1/ 6 systolic murmur in the aortic area.  No lower extremity edema Respiratory:  clear to auscultation bilaterally, normal work of breathing GI: soft, nontender, nondistended, + BS MS: no deformity or atrophy  Skin: warm and dry, no rash Neuro:  Strength and sensation are intact Psych: euthymic mood, full affect Right radial pulses normal.  EKG:  EKG is ordered today. The ekg ordered today demonstrates; Normal sinus rhythm Nonspecific T wave abnormality When compared with ECG of 15-Feb-2023 15:53, Left bundle branch block is no longer Present    Recent Labs: 12/06/2023: ALT 11; BUN 12; Creatinine, Ser 0.93; Potassium 4.0; Sodium 140    Lipid Panel    Component Value Date/Time   CHOL 157 12/06/2023 0954   TRIG 140 12/06/2023 0954   HDL 33 (L) 12/06/2023 0954   CHOLHDL 4.8 (H) 12/06/2023 0954   CHOLHDL 5.7 02/24/2016 0618   VLDL 30 02/24/2016 0618   LDLCALC 99 12/06/2023 0954      Wt Readings from Last 3 Encounters:  01/10/24 273 lb 2 oz (123.9 kg)  12/07/23 266 lb 3.2 oz (120.7 kg)  08/31/23 269 lb 12.8 oz (122.4 kg)          02/23/2017    3:01 PM  PAD Screen  Previous PAD dx? No  Previous surgical procedure? No  Pain with walking? Yes  Subsides with rest? No  Feet/toe relief with dangling? No  Painful, non-healing ulcers? No  Extremities discolored? No      ASSESSMENT AND PLAN:  1.  Mild coronary artery disease with other forms of angina:  She has chronic angina thought to be due to coronary spasm or endothelial dysfunction.  Most recent cardiac catheterization in March of 2024 showed minimal coronary artery disease.  She is on chronic antianginal therapy with no significant change in symptoms.  2.  Chronic systolic heart failure: Most recent ejection fraction was 50 to 55%.  She is on optimal medical therapy and appears to be euvolemic.  3.  Hyperlipidemia: She is currently on simvastatin  most recent lipid profile showed an LDL of  99.  Given that she is diabetic, recommended target LDL of less than 70.  Thus, I switch her from simvastatin  to rosuvastatin 20 mg daily.  4.  Previous tobacco use: No relapse.  5.  Essential hypertension: Blood pressure is controlled.   Disposition:   FU with me in 6 months  Signed,  Deatrice Cage, MD  01/10/2024 11:31 AM    [facility]

## 2024-01-10 NOTE — Patient Instructions (Signed)
 Medication Instructions:  STOP the Simvastatin    START Rosuvastatin 20 mg once daily  *If you need a refill on your cardiac medications before your next appointment, please call your pharmacy*  Lab Work: None ordered If you have labs (blood work) drawn today and your tests are completely normal, you will receive your results only by: MyChart Message (if you have MyChart) OR A paper copy in the mail If you have any lab test that is abnormal or we need to change your treatment, we will call you to review the results.  Testing/Procedures: None ordered  Follow-Up: At [facility], you and your health needs are our priority.  As part of our continuing mission to provide you with exceptional heart care, our providers are all part of one team.  This team includes your primary Cardiologist (physician) and Advanced Practice Providers or APPs (Physician Assistants and Nurse Practitioners) who all work together to provide you with the care you need, when you need it.  Your next appointment:   6 month(s)  Provider:   You may see Deatrice Cage, MD or one of the following Advanced Practice Providers on your designated Care Team:   Lonni Meager, NP Lesley Maffucci, PA-C Bernardino Bring, PA-C Cadence Mays Lick, PA-C Tylene Lunch, NP Barnie Hila, NP    We recommend signing up for the patient portal called MyChart.  Sign up information is provided on this After Visit Summary.  MyChart is used to connect with patients for Virtual Visits (Telemedicine).  Patients are able to view lab/test results, encounter notes, upcoming appointments, etc.  Non-urgent messages can be sent to your provider as well.   To learn more about what you can do with MyChart, go to [email or web address].

## 2024-01-11 ENCOUNTER — Other Ambulatory Visit
Admission: RE | Admit: 2024-01-11 | Discharge: 2024-01-11 | Disposition: A | Discharge status: Home / Self Care | Payer: Self-pay | Source: Ambulatory Visit | Attending: Medical Genetics | Admitting: Medical Genetics

## 2024-01-21 LAB — GENECONNECT MOLECULAR SCREEN: Genetic Analysis Overall Interpretation: NEGATIVE

## 2024-01-27 MED FILL — Spironolactone Tab 25 MG: ORAL | 90 days supply | Qty: 90 | Fill #1 | Status: AC

## 2024-01-27 MED FILL — Torsemide Tab 20 MG: ORAL | 90 days supply | Qty: 270 | Fill #1 | Status: AC

## 2024-02-04 ENCOUNTER — Other Ambulatory Visit: Payer: Self-pay | Admitting: Cardiovascular Disease

## 2024-02-04 ENCOUNTER — Other Ambulatory Visit: Payer: Self-pay | Admitting: Internal Medicine

## 2024-02-04 DIAGNOSIS — E119 Type 2 diabetes mellitus without complications: Secondary | ICD-10-CM

## 2024-02-05 ENCOUNTER — Other Ambulatory Visit: Payer: Self-pay

## 2024-02-05 MED ORDER — MOUNJARO 7.5 MG/0.5ML ~~LOC~~ SOAJ
7.5000 mg | SUBCUTANEOUS | 0 refills | Status: DC
  Filled 2024-02-05: qty 2, 28d supply, fill #0

## 2024-02-05 MED ORDER — RANOLAZINE ER 500 MG PO TB12
500.0000 mg | ORAL_TABLET | Freq: Two times a day (BID) | ORAL | 1 refills | Status: AC
  Filled 2024-02-05: qty 180, 90d supply, fill #0
  Filled 2024-05-06: qty 180, 90d supply, fill #1

## 2024-02-06 ENCOUNTER — Other Ambulatory Visit: Payer: Self-pay

## 2024-02-11 ENCOUNTER — Other Ambulatory Visit: Payer: Self-pay

## 2024-02-12 ENCOUNTER — Other Ambulatory Visit: Payer: Self-pay

## 2024-02-14 MED FILL — Potassium Chloride Microencapsulated Crys ER Tab 20 mEq: ORAL | 90 days supply | Qty: 180 | Fill #2 | Status: AC

## 2024-02-20 ENCOUNTER — Other Ambulatory Visit: Payer: Self-pay | Admitting: Internal Medicine

## 2024-02-20 DIAGNOSIS — E119 Type 2 diabetes mellitus without complications: Secondary | ICD-10-CM

## 2024-02-20 MED ORDER — METFORMIN HCL ER 500 MG PO TB24
500.0000 mg | ORAL_TABLET | Freq: Every day | ORAL | 0 refills | Status: AC
  Filled 2024-02-20: qty 90, 90d supply, fill #0

## 2024-02-21 ENCOUNTER — Other Ambulatory Visit: Payer: Self-pay

## 2024-02-29 ENCOUNTER — Ambulatory Visit: Admitting: Internal Medicine

## 2024-03-03 ENCOUNTER — Other Ambulatory Visit

## 2024-03-04 ENCOUNTER — Other Ambulatory Visit: Payer: Self-pay

## 2024-03-04 ENCOUNTER — Ambulatory Visit: Admitting: Internal Medicine

## 2024-03-04 ENCOUNTER — Ambulatory Visit: Payer: Self-pay | Admitting: Internal Medicine

## 2024-03-04 VITALS — BP 110/68 | HR 71 | Temp 97.8°F | Ht 66.0 in | Wt 269.2 lb

## 2024-03-04 DIAGNOSIS — I5022 Chronic systolic (congestive) heart failure: Secondary | ICD-10-CM | POA: Diagnosis not present

## 2024-03-04 DIAGNOSIS — Z23 Encounter for immunization: Secondary | ICD-10-CM | POA: Diagnosis not present

## 2024-03-04 DIAGNOSIS — Z1231 Encounter for screening mammogram for malignant neoplasm of breast: Secondary | ICD-10-CM | POA: Diagnosis not present

## 2024-03-04 DIAGNOSIS — E1165 Type 2 diabetes mellitus with hyperglycemia: Secondary | ICD-10-CM | POA: Diagnosis not present

## 2024-03-04 DIAGNOSIS — J301 Allergic rhinitis due to pollen: Secondary | ICD-10-CM

## 2024-03-04 DIAGNOSIS — K219 Gastro-esophageal reflux disease without esophagitis: Secondary | ICD-10-CM | POA: Diagnosis not present

## 2024-03-04 DIAGNOSIS — E119 Type 2 diabetes mellitus without complications: Secondary | ICD-10-CM

## 2024-03-04 DIAGNOSIS — Z0130 Encounter for examination of blood pressure without abnormal findings: Secondary | ICD-10-CM

## 2024-03-04 LAB — COMPREHENSIVE METABOLIC PANEL WITH GFR
ALT: 10 IU/L (ref 0–32)
AST: 12 IU/L (ref 0–40)
Albumin: 4.2 g/dL (ref 3.8–4.9)
Alkaline Phosphatase: 92 IU/L (ref 49–135)
BUN/Creatinine Ratio: 18 (ref 9–23)
BUN: 16 mg/dL (ref 6–24)
Bilirubin Total: 0.4 mg/dL (ref 0.0–1.2)
CO2: 23 mmol/L (ref 20–29)
Calcium: 9.4 mg/dL (ref 8.7–10.2)
Chloride: 101 mmol/L (ref 96–106)
Creatinine, Ser: 0.88 mg/dL (ref 0.57–1.00)
Globulin, Total: 2.8 g/dL (ref 1.5–4.5)
Glucose: 105 mg/dL — ABNORMAL HIGH (ref 70–99)
Potassium: 4.2 mmol/L (ref 3.5–5.2)
Sodium: 139 mmol/L (ref 134–144)
Total Protein: 7 g/dL (ref 6.0–8.5)
eGFR: 77 mL/min/1.73 (ref >59)

## 2024-03-04 LAB — LIPID PANEL
Chol/HDL Ratio: 3.9 ratio (ref 0.0–4.4)
Cholesterol, Total: 126 mg/dL (ref 100–199)
HDL: 32 mg/dL — ABNORMAL LOW (ref >39)
LDL Chol Calc (NIH): 71 mg/dL (ref 0–99)
Triglycerides: 125 mg/dL (ref 0–149)
VLDL Cholesterol Cal: 23 mg/dL (ref 5–40)

## 2024-03-04 LAB — POCT CBG (FASTING - GLUCOSE)-MANUAL ENTRY: Glucose Fasting, POC: 107 mg/dL — AB (ref 70–99)

## 2024-03-04 LAB — HEMOGLOBIN A1C
Est. average glucose Bld gHb Est-mCnc: 105 mg/dL
Hgb A1c MFr Bld: 5.3 % (ref 4.8–5.6)

## 2024-03-04 MED ORDER — MOUNJARO 10 MG/0.5ML ~~LOC~~ SOAJ
10.0000 mg | SUBCUTANEOUS | 2 refills | Status: AC
  Filled 2024-03-04: qty 2, 28d supply, fill #0
  Filled 2024-03-26: qty 2, 28d supply, fill #1
  Filled 2024-04-24: qty 2, 28d supply, fill #2

## 2024-03-04 MED ORDER — CETIRIZINE HCL 10 MG PO TABS
10.0000 mg | ORAL_TABLET | Freq: Every day | ORAL | 2 refills | Status: AC
  Filled 2024-03-04: qty 30, 30d supply, fill #0
  Filled 2024-03-26: qty 30, 30d supply, fill #1
  Filled 2024-04-24: qty 30, 30d supply, fill #2

## 2024-03-04 MED ORDER — TORSEMIDE 20 MG PO TABS
60.0000 mg | ORAL_TABLET | Freq: Every day | ORAL | 1 refills | Status: AC
  Filled 2024-03-04 – 2024-04-30 (×2): qty 270, 90d supply, fill #0

## 2024-03-04 MED ORDER — PANTOPRAZOLE SODIUM 40 MG PO TBEC
40.0000 mg | DELAYED_RELEASE_TABLET | Freq: Every day | ORAL | 1 refills | Status: AC
  Filled 2024-03-04 – 2024-04-14 (×2): qty 90, 90d supply, fill #0

## 2024-03-04 NOTE — Progress Notes (Signed)
 Established Patient Office Visit  Subjective:  Patient ID: Kathy [name], female    DOB: Nov 29, 1965  Age: 58 y.o. MRN: [id number]  Chief Complaint  Patient presents with  . Follow-up    3 month follow up with lab results, needs a pap next visit.    No new complaints, here for lab review and medication refills. Labs reviewed and notable for well controlled diabetes, A1c remains at target, lipids at target with unremarkable cmp. Denies any hypoglycemic episodes and home bg readings have been at target.     No other concerns at this time.   Past Medical History:  Diagnosis Date  . 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 02/2016 Echo: EF 45%; b. 03/2016 Echo: EF 55%; c. 12/2019 Echo: EF 55-60%, no rwma, gr2 DD, nl RV size/fxn, mildly dil RA, mild MR.  . Asthma   . Chronic chest pain    a. ? vasospasm vs endothelial dysfxn. Improved w/ Ranexa  but couldn't afford.  . 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 Presumed in setting of prior h/o MI w/ insignificant coronary dzs.  . Diabetes mellitus without complication (HCC)   . Heart murmur   . History of NICM (nonischemic cardiomyopathy) (HCC)    a. 02/2016 Echo: EF 45%; b. 12/2019 Echo: EF 55-60%.  . Hyperlipidemia   . Hypertension   . Myocardial infarction (HCC)   . Non-obstructive Coronary artery disease    a. Prior caths in 2007, 2011, 2012 - nonobs dzs; b. 03/2016 Cath ([facility]): LM nl, LAD/LCX/RCA insignificant dzs.  . Obesity   . Rate-dependent LBBB (left bundle branch block)   . Remote Tobacco abuse    a. 30+ pack year history - quit 2019.  . Seizures (HCC)     Past Surgical History:  Procedure Laterality Date  . ANTERIOR CRUCIATE LIGAMENT REPAIR    . BACK SURGERY     L4 and L5  . CARDIAC CATHETERIZATION  04/09/2016  . COLONOSCOPY    . COLONOSCOPY WITH PROPOFOL  N/A 08/04/2019   Procedure: COLONOSCOPY WITH PROPOFOL ;  Surgeon: Unk Corinn Skiff, MD;  Location: [facility] ENDOSCOPY;  Service:  Gastroenterology;  Laterality: N/A;  . EMPYEMA DRAINAGE     at age 64  . RIGHT/LEFT HEART CATH AND CORONARY ANGIOGRAPHY Bilateral 07/04/2022   Procedure: RIGHT/LEFT HEART CATH AND CORONARY ANGIOGRAPHY;  Surgeon: Darron Deatrice LABOR, MD;  Location: [facility] INVASIVE CV LAB;  Service: Cardiovascular;  Laterality: Bilateral;  . TONSILLECTOMY    . TONSILLECTOMY AND ADENOIDECTOMY  2008  . TUBAL LIGATION      Social History   Socioeconomic History  . Marital status: Divorced    Spouse name: Not on file  . Number of children: 2  . Years of education: pre-requisites   . Highest education level: GED or equivalent  Occupational History  . Occupation: unemployed  Tobacco Use  . Smoking status: Former    Current packs/day: 0.00    Average packs/day: 1 pack/day for 30.0 years (30.0 ttl pk-yrs)    Types: Cigarettes    Start date: 08/17/1987    Quit date: 08/16/2017    Years since quitting: 6.5  . Smokeless tobacco: Never  Vaping Use  . Vaping status: Never Used  Substance and Sexual Activity  . Alcohol use: Not Currently    Comment: last use 01/2022  . Drug use: Not Currently    Comment: Previously used cocaine in the 1990s  . Sexual activity: Yes    Birth control/protection:  Condom  Other Topics Concern  . Not on file  Social History Narrative   Currently on food stamps. They help somewhat but still difficult to buy food that she needs. Boyfriend helps pay rent with disability, they live together. Roof is leaking really badly. Lives in modular home. Social services bought tarp which helped, but it deteriorated from heat.    Social Drivers of Health   Financial Resource Strain: High Risk (02/12/2018)   Overall Financial Resource Strain (CARDIA)   . Difficulty of Paying Living Expenses: Hard  Food Insecurity: No Food Insecurity (10/20/2021)   Hunger Vital Sign   . Worried About Programme Researcher, Broadcasting/film/video in the Last Year: Never true   . Ran Out of Food in the Last Year: Never true  Transportation  Needs: No Transportation Needs (10/20/2021)   PRAPARE - Transportation   . Lack of Transportation (Medical): No   . Lack of Transportation (Non-Medical): No  Physical Activity: Sufficiently Active (11/06/2017)   Exercise Vital Sign   . Days of Exercise per Week: 2 days   . Minutes of Exercise per Session: 100 min  Stress: Stress Concern Present (11/06/2017)   [employer] of Occupational Health - Occupational Stress Questionnaire   . Feeling of Stress : To some extent  Social Connections: Moderately Isolated (11/06/2017)   Social Connection and Isolation Panel   . Frequency of Communication with Friends and Family: More than three times a week   . Frequency of Social Gatherings with Friends and Family: Once a week   . Attends Religious Services: Never   . Active Member of Clubs or Organizations: No   . Attends Banker Meetings: Never   . Marital Status: Divorced  Catering Manager Violence: Not At Risk (11/06/2017)   Humiliation, Afraid, Rape, and Kick questionnaire   . Fear of Current or Ex-Partner: No   . Emotionally Abused: No   . Physically Abused: No   . Sexually Abused: No    Family History  Problem Relation Age of Onset  . Kidney disease Sister   . Diabetes Sister   . COPD Sister   . Stroke Sister   . Thyroid  disease Sister   . Diabetes Mother   . Stroke Mother   . Heart attack Mother   . Thyroid  disease Mother   . Colon cancer Mother   . Stroke Father   . Heart attack Father   . Asthma Daughter   . Hypertension Daughter   . Thyroid  disease Daughter   . Asthma Son   . Heart disease Maternal Grandmother   . Hypertension Maternal Grandmother   . Colon cancer Maternal Grandfather   . Hypertension Maternal Grandfather   . Hyperlipidemia Maternal Grandfather   . Stroke Paternal Grandmother   . Hypertension Paternal Grandfather   . Hyperlipidemia Paternal Grandfather   . Heart disease Paternal Grandfather   . Thyroid  disease Sister   . Migraines  Sister     Allergies  Allergen Reactions  . Aspirin  Anaphylaxis, Hives and Swelling    Occurred as a child Occurred as a child  . Beef Allergy Hives  . Celebrex [Celecoxib] Anaphylaxis and Hives  . Fentanyl Anaphylaxis  . Fluticasone  Propionate Other (See Comments) and Swelling    Swelling, pain, and itching  . Gabapentin Shortness Of Breath  . Hydrocodone-Acetaminophen  Hives, Other (See Comments) and Swelling    Feels like her tongue swells. Trouble swallowing  . Latex Hives  . Lisinopril  Shortness Of Breath  . Morphine Anaphylaxis  and Swelling  . Oxycodone-Acetaminophen  Hives and Shortness Of Breath  . Percocet [Oxycodone-Acetaminophen ] Hives and Shortness Of Breath  . Pork Allergy Hives  . Simvastatin  Hives and Shortness Of Breath    Can tolerate 20 mg dose.  . Skelaxin [Metaxalone] Anaphylaxis  . Bovine (Beef) Protein-Containing Drug Products Hives  . Porcine (Pork) Protein-Containing Drug Products Hives  . Vicodin [Hydrocodone-Acetaminophen ] Hives and Other (See Comments)    Feels like her tongue swells. Trouble swallowing  . Albuterol Palpitations  . Jardiance  [Empagliflozin ] Nausea Only and Palpitations    Outpatient Medications Prior to Visit  Medication Sig  . Accu-Chek Softclix Lancets lancets Use as instructed  . Accu-Chek Softclix Lancets lancets Use as instructed  . blood glucose meter kit and supplies KIT Dispense based on patient and insurance preference. Use up to four times daily as directed. (FOR ICD-9 250.00, 250.01).  . Blood Pressure Monitoring (BLOOD PRESSURE KIT) KIT 1 kit by Does not apply route daily as needed.  . clopidogrel  (PLAVIX ) 75 MG tablet Take 1 tablet (75 mg total) by mouth once daily.  . diphenhydrAMINE  (BENADRYL ) 50 MG tablet Take 50 mg by mouth 2 (two) times daily.  . EPINEPHrine  0.3 mg/0.3 mL IJ SOAJ injection Inject 0.3 into the muscle as needed for anaphylaxis  . glucose blood (ACCU-CHEK GUIDE TEST) test strip Use as instructed  .  Insulin  Pen Needle 32G X 6 MM MISC Use one pen needle with Ozempic  once every week  . isosorbide  mononitrate (IMDUR ) 60 MG 24 hr tablet Take 1 tablet (60 mg total) by mouth daily.  . metFORMIN  (GLUCOPHAGE -XR) 500 MG 24 hr tablet Take 1 tablet (500 mg total) by mouth daily with breakfast.  . metoprolol  tartrate (LOPRESSOR ) 25 MG tablet Take 1 tablet (25 mg total) by mouth 2 (two) times daily.  . Multiple Vitamins-Minerals (HAIR SKIN AND NAILS FORMULA PO) Take 2 capsules by mouth daily.  . nitroGLYCERIN  (NITROSTAT ) 0.4 MG SL tablet 1 TABLET UNDER TONGUE AS NEEDED FOR CHEST PAIN EVERY 5 MINUTES FOR MAX OF 3 DOSES IN 15 MINUTES; IF NO RELIEF AFTER 1ST DOSE CALL 911  . pantoprazole  (PROTONIX ) 40 MG tablet Take 1 tablet (40 mg total) by mouth once daily.  . potassium chloride  SA (KLOR-CON  M) 20 MEQ tablet Take 1 tablet (20 mEq total) by mouth 2 (two) times daily.  . ranolazine  (RANEXA ) 500 MG 12 hr tablet Take 1 tablet (500 mg total) by mouth 2 (two) times daily.  . rosuvastatin  (CRESTOR ) 20 MG tablet Take 1 tablet (20 mg total) by mouth daily.  . sacubitril -valsartan  (ENTRESTO ) 24-26 MG Take 1 tablet by mouth 2 (two) times daily.  . spironolactone  (ALDACTONE ) 25 MG tablet Take 1 tablet (25 mg total) by mouth daily.  . tirzepatide  (MOUNJARO ) 7.5 MG/0.5ML Pen Inject 7.5 mg into the skin once a week.  . torsemide  (DEMADEX ) 20 MG tablet Take 3 tablets (60 mg total) by mouth daily.   No facility-administered medications prior to visit.    Review of Systems  Constitutional:  Positive for weight loss (4 lbs).  HENT:  Positive for hearing loss.   Eyes: Negative.   Respiratory: Negative.    Cardiovascular: Negative.   Gastrointestinal: Negative.   Genitourinary: Negative.   Musculoskeletal:  Positive for joint pain.  Skin: Negative.   Neurological: Negative.   Endo/Heme/Allergies: Negative.        Objective:   BP 110/68   Pulse 71   Temp 97.8 F (36.6 C)   Ht 5' 6 (1.676  m)   Wt 269 lb  3.2 oz (122.1 kg)   LMP 06/09/2018   SpO2 99%   BMI 43.45 kg/m   Vitals:   03/04/24 1512  BP: 110/68  Pulse: 71  Temp: 97.8 F (36.6 C)  Height: 5' 6 (1.676 m)  Weight: 269 lb 3.2 oz (122.1 kg)  SpO2: 99%  BMI (Calculated): 43.47    Physical Exam Vitals reviewed.  Constitutional:      General: She is not in acute distress.    Appearance: She is obese.  HENT:     Head: Normocephalic.     Right Ear: Decreased hearing noted.     Left Ear: Decreased hearing noted.     Nose: Nose normal.     Mouth/Throat:     Mouth: Mucous membranes are moist.  Eyes:     Extraocular Movements: Extraocular movements intact.     Pupils: Pupils are equal, round, and reactive to light.  Cardiovascular:     Rate and Rhythm: Normal rate and regular rhythm.     Heart sounds: No murmur heard. Pulmonary:     Effort: Pulmonary effort is normal.     Breath sounds: No rhonchi or rales.  Abdominal:     General: Abdomen is flat.     Palpations: There is no hepatomegaly, splenomegaly or mass.  Musculoskeletal:     Right shoulder: Decreased range of motion.     Left shoulder: Decreased range of motion.     Cervical back: Normal range of motion. No tenderness.  Skin:    General: Skin is warm and dry.     Findings: Rash present. Rash is urticarial.  Neurological:     General: No focal deficit present.     Mental Status: She is alert and oriented to person, place, and time.     Cranial Nerves: No cranial nerve deficit.     Motor: No weakness.  Psychiatric:        Mood and Affect: Mood normal.        Behavior: Behavior normal.      Results for orders placed or performed in visit on 03/04/24  POCT CBG (Fasting - Glucose)  Result Value Ref Range   Glucose Fasting, POC 107 (A) 70 - 99 mg/dL    Lab Results  Component Value Date   HGBA1C 5.3 03/03/2024   HGBA1C 5.4 12/06/2023   HGBA1C 5.3 08/30/2023   Lipid Panel     Component Value Date/Time   CHOL 126 03/03/2024 1333   TRIG 125  03/03/2024 1333   HDL 32 (L) 03/03/2024 1333   CHOLHDL 3.9 03/03/2024 1333   CHOLHDL 5.7 02/24/2016 0618   VLDL 30 02/24/2016 0618   LDLCALC 71 03/03/2024 1333   LABVLDL 23 03/03/2024 1333       Assessment & Plan:  Kathy [name] was seen today for follow-up.  Type 2 diabetes mellitus without complication, without long-term current use of insulin  (HCC) -     POCT CBG (Fasting - Glucose) -     Mounjaro ; Inject 10 mg into the skin once a week.  Dispense: 2 mL; Refill: 2  Gastroesophageal reflux disease, unspecified whether esophagitis present -     Pantoprazole  Sodium; Take 1 tablet (40 mg total) by mouth once daily.  Dispense: 90 tablet; Refill: 1  Chronic systolic congestive heart failure (HCC) -     Torsemide ; Take 3 tablets (60 mg total) by mouth daily.  Dispense: 270 tablet; Refill: 1    Problem List Items Addressed  This Visit       Endocrine   Type 2 diabetes mellitus without complications (HCC) - Primary   Relevant Orders   POCT CBG (Fasting - Glucose) (Completed)    No follow-ups on file.   Total time spent: 20 minutes. This time includes review of previous notes and results and patient face to face interaction during today'Aaron Boeh visit.    Sherrill Cinderella Perry, MD  03/04/2024   This document may have been prepared by [facility] Voice Recognition software and as such may include unintentional dictation errors.

## 2024-03-05 ENCOUNTER — Other Ambulatory Visit: Payer: Self-pay

## 2024-03-05 MED ORDER — PREVNAR 20 0.5 ML IM SUSY
PREFILLED_SYRINGE | INTRAMUSCULAR | 0 refills | Status: AC
  Filled 2024-03-05: qty 0.5, 1d supply, fill #0

## 2024-03-10 ENCOUNTER — Encounter: Payer: Self-pay | Admitting: Internal Medicine

## 2024-03-10 NOTE — Addendum Note (Signed)
 Addended by: CHESLEY KNEE on: 03/10/2024 10:25 AM   Modules accepted: Orders

## 2024-03-21 LAB — CORTISOL DEXAMETHASONE REFLEX: Cortisol, Serum LCMS: 32 ug/dL — ABNORMAL HIGH

## 2024-03-21 LAB — DEXAMETHASONE, BLOOD: Dexamethasone, Blood: <30 ng/dL

## 2024-04-09 ENCOUNTER — Other Ambulatory Visit: Payer: Self-pay

## 2024-04-14 ENCOUNTER — Other Ambulatory Visit: Payer: Self-pay

## 2024-04-14 DIAGNOSIS — E119 Type 2 diabetes mellitus without complications: Secondary | ICD-10-CM

## 2024-04-14 MED ORDER — ACCU-CHEK SOFTCLIX LANCETS MISC
12 refills | Status: AC
  Filled 2024-04-14: qty 100, 50d supply, fill #0

## 2024-04-14 MED ORDER — ACCU-CHEK GUIDE TEST VI STRP
ORAL_STRIP | 12 refills | Status: AC
  Filled 2024-04-14: qty 100, 50d supply, fill #0

## 2024-04-18 ENCOUNTER — Other Ambulatory Visit: Payer: Self-pay

## 2024-04-18 ENCOUNTER — Other Ambulatory Visit: Payer: Self-pay | Admitting: Internal Medicine

## 2024-04-18 DIAGNOSIS — E66813 Obesity, class 3: Secondary | ICD-10-CM

## 2024-04-18 MED ORDER — DEXAMETHASONE 1 MG PO TABS
1.0000 mg | ORAL_TABLET | Freq: Once | ORAL | 0 refills | Status: AC
  Filled 2024-04-18: qty 1, 1d supply, fill #0

## 2024-04-24 ENCOUNTER — Other Ambulatory Visit: Payer: Self-pay

## 2024-04-24 ENCOUNTER — Other Ambulatory Visit: Payer: Self-pay | Admitting: Internal Medicine

## 2024-04-25 ENCOUNTER — Other Ambulatory Visit: Payer: Self-pay

## 2024-04-25 MED ORDER — SPIRONOLACTONE 25 MG PO TABS
25.0000 mg | ORAL_TABLET | Freq: Every day | ORAL | 1 refills | Status: AC
  Filled 2024-04-25: qty 90, 90d supply, fill #0

## 2024-04-29 ENCOUNTER — Other Ambulatory Visit: Payer: Self-pay | Admitting: Cardiovascular Disease

## 2024-04-30 ENCOUNTER — Other Ambulatory Visit: Payer: Self-pay

## 2024-05-01 ENCOUNTER — Other Ambulatory Visit: Payer: Self-pay

## 2024-05-01 ENCOUNTER — Other Ambulatory Visit

## 2024-05-02 ENCOUNTER — Ambulatory Visit: Payer: Self-pay | Admitting: Internal Medicine

## 2024-05-02 ENCOUNTER — Other Ambulatory Visit: Payer: Self-pay

## 2024-05-02 LAB — CORTISOL: Cortisol: 0.8 ug/dL — ABNORMAL LOW (ref 6.2–19.4)

## 2024-05-02 MED ORDER — ISOSORBIDE MONONITRATE ER 60 MG PO TB24
60.0000 mg | ORAL_TABLET | Freq: Every day | ORAL | 1 refills | Status: AC
  Filled 2024-05-02: qty 90, 90d supply, fill #0

## 2024-05-06 ENCOUNTER — Other Ambulatory Visit: Payer: Self-pay | Admitting: Cardiovascular Disease

## 2024-05-07 ENCOUNTER — Other Ambulatory Visit: Payer: Self-pay

## 2024-05-07 MED ORDER — CLOPIDOGREL BISULFATE 75 MG PO TABS
75.0000 mg | ORAL_TABLET | Freq: Every day | ORAL | 0 refills | Status: AC
  Filled 2024-05-07: qty 90, 90d supply, fill #0

## 2024-05-08 ENCOUNTER — Other Ambulatory Visit: Payer: Self-pay

## 2024-05-12 MED FILL — Potassium Chloride Microencapsulated Crys ER Tab 20 mEq: ORAL | 90 days supply | Qty: 180 | Fill #3 | Status: AC

## 2024-06-10 ENCOUNTER — Ambulatory Visit: Admitting: Internal Medicine
